# Patient Record
Sex: Male | Born: 1935 | Race: White | Hispanic: No | Smoking: Never smoker
Health system: Southern US, Community
[De-identification: ages and names within clinical notes are randomized; demographics above are authoritative.]

## PROBLEM LIST (undated history)

## (undated) DIAGNOSIS — M1990 Unspecified osteoarthritis, unspecified site: Secondary | ICD-10-CM

## (undated) DIAGNOSIS — I639 Cerebral infarction, unspecified: Secondary | ICD-10-CM

## (undated) DIAGNOSIS — R011 Cardiac murmur, unspecified: Secondary | ICD-10-CM

## (undated) DIAGNOSIS — I10 Essential (primary) hypertension: Secondary | ICD-10-CM

## (undated) HISTORY — PX: JOINT REPLACEMENT: SHX530

## (undated) HISTORY — PX: TONSILLECTOMY: SUR1361

---

## 2013-03-18 ENCOUNTER — Encounter (HOSPITAL_COMMUNITY): Payer: Self-pay | Admitting: Pharmacy Technician

## 2013-03-22 NOTE — H&P (Signed)
TOTAL KNEE ADMISSION H&P  Patient is being admitted for right total knee arthroplasty.  Subjective:  Chief Complaint:right knee pain.  HPI: Chad [name], 79 y.o. male, has a history of pain and functional disability in the right knee due to arthritis and has failed non-surgical conservative treatments for greater than 12 weeks to includeNSAID's and/or analgesics, corticosteriod injections, flexibility and strengthening excercises and activity modification.  Onset of symptoms was gradual, starting 3-4 years ago with gradually worsening course since that time. The patient noted no past surgery on the right knee(s).  Patient currently rates pain in the right knee(s) at 6 out of 10 with activity. Patient has night pain, worsening of pain with activity and weight bearing, pain that interferes with activities of daily living and joint swelling.  Patient has evidence of periarticular osteophytes and joint space narrowing by imaging studies. There is no active infection.  Past Medical History Hypertension Hypercholesteremia Heart Murmur BPH Osteoarthritis   Past Surgical History Tonsillectomy  Current outpatient prescriptions: amLODipine (NORVASC) 5 MG tablet, Take 5 mg by mouth every morning., Disp: , Rfl: ;   atenolol (TENORMIN) 100 MG tablet, Take 100 mg by mouth every morning., Disp: , Rfl: ;   losartan-hydrochlorothiazide (HYZAAR) 100-12.5 MG per tablet, Take 1 tablet by mouth every morning., Disp: , Rfl: ;   lovastatin (MEVACOR) 40 MG tablet, Take 40 mg by mouth at bedtime., Disp: , Rfl:  tamsulosin (FLOMAX) 0.4 MG CAPS capsule, Take 0.4 mg by mouth daily., Disp: , Rfl:   No Known Allergies   History  Substance Use Topics  . Smoking status: Nonsmoker  . Smokeless tobacco: None  . Alcohol Use: Occasional beer    Family History Father deceased age 32 due to meningitis Mother deceased age 39 due to "old age"  Review of Systems  Constitutional: Negative.   HENT: Negative.   Negative for neck pain.   Eyes: Negative.   Respiratory: Negative.   Cardiovascular: Negative.   Gastrointestinal: Negative.   Genitourinary: Positive for frequency. Negative for dysuria, urgency, hematuria and flank pain.  Musculoskeletal: Positive for joint pain. Negative for myalgias, back pain and falls.  Skin: Negative.   Neurological: Negative.   Endo/Heme/Allergies: Negative.   Psychiatric/Behavioral: Negative.     Objective:  Physical Exam  Constitutional: He is oriented to person, place, and time. He appears well-developed and well-nourished. No distress.  HENT:  Head: Normocephalic and atraumatic.  Right Ear: External ear normal.  Left Ear: External ear normal.  Nose: Nose normal.  Mouth/Throat: Oropharynx is clear and moist.  Eyes: Conjunctivae and EOM are normal.  Neck: Normal range of motion. Neck supple.  Cardiovascular: Normal rate, regular rhythm and intact distal pulses.   Murmur heard.  Systolic murmur is present with a grade of 3/6  Respiratory: Effort normal and breath sounds normal. No respiratory distress. He has no wheezes.  GI: Soft. Bowel sounds are normal. He exhibits no distension and no mass. There is no tenderness.  Musculoskeletal:       Right hip: Normal.       Left hip: Normal.       Right knee: He exhibits decreased range of motion and swelling. He exhibits no effusion and no erythema. Tenderness found. Medial joint line and lateral joint line tenderness noted.       Left knee: Normal.       Left lower leg: He exhibits no tenderness and no swelling.  Lymphadenopathy:    He has no cervical adenopathy.  Neurological: He  is alert and oriented to person, place, and time. He has normal strength and normal reflexes. No sensory deficit.  Skin: He is not diaphoretic.  Psychiatric: He has a normal mood and affect. His behavior is normal.   Vitals Weight: 154 lb Height: 65 in Body Surface Area: 1.79 m Body Mass Index: 25.63 kg/m Pulse: 72  (Regular) BP: 140/85 (Sitting, Left Arm, Standard)  Imaging Review Plain radiographs demonstrate severe degenerative joint disease of the right knee(s). The overall alignment issignificant varus. The bone quality appears to be fair for age and reported activity level.  Assessment/Plan:  End stage arthritis, right knee   The patient history, physical examination, clinical judgment of the provider and imaging studies are consistent with end stage degenerative joint disease of the right knee(s) and total knee arthroplasty is deemed medically necessary. The treatment options including medical management, injection therapy arthroscopy and arthroplasty were discussed at length. The risks and benefits of total knee arthroplasty were presented and reviewed. The risks due to aseptic loosening, infection, stiffness, patella tracking problems, thromboembolic complications and other imponderables were discussed. The patient acknowledged the explanation, agreed to proceed with the plan and consent was signed. Patient is being admitted for inpatient treatment for surgery, pain control, PT, OT, prophylactic antibiotics, VTE prophylaxis, progressive ambulation and ADL's and discharge planning. The patient is planning to be discharged home with home health services     [place], [place]

## 2013-03-22 NOTE — Patient Instructions (Signed)
Chad [name]  03/22/2013   Your procedure is scheduled on:  03/30/13               Surgery 0730am-1000am  Report to [facility] at    [place]  AM.  Call this number if you have problems the morning of surgery: [phone]   Remember:   Do not eat food or drink liquids after midnight.   Take these medicines the morning of surgery with A SIP OF WATER:    Do not wear jewelry,  Do not wear lotions, powders, or perfumes.   Men may shave face and neck.  Do not bring valuables to the hospital.  Contacts, dentures or bridgework may not be worn into surgery.  Leave suitcase in the car. After surgery it may be brought to your room.       SEE CHG INSTRUCTION SHEET    Please read over the following fact sheets that you were given: MRSA Information, coughing and deep breathing exercises, leg exercises, Blood Transfusion Fact Sheet , Incentive Spirometry Fact sheet                Failure to comply with these instructions may result in cancellation of your surgery.                Patient Signature ____________________________              Nurse Signature _____________________________

## 2013-03-23 ENCOUNTER — Ambulatory Visit (HOSPITAL_COMMUNITY)
Admission: RE | Admit: 2013-03-23 | Discharge: 2013-03-23 | Disposition: A | Discharge status: Home / Self Care | Payer: Medicare Other | Source: Ambulatory Visit | Attending: Surgical | Admitting: Surgical

## 2013-03-23 ENCOUNTER — Encounter (HOSPITAL_COMMUNITY): Payer: Self-pay

## 2013-03-23 ENCOUNTER — Encounter (HOSPITAL_COMMUNITY)
Admission: RE | Admit: 2013-03-23 | Discharge: 2013-03-23 | Disposition: A | Discharge status: Home / Self Care | Payer: Medicare Other | Source: Ambulatory Visit | Attending: Orthopedic Surgery | Admitting: Orthopedic Surgery

## 2013-03-23 DIAGNOSIS — M1710 Unilateral primary osteoarthritis, unspecified knee: Secondary | ICD-10-CM | POA: Insufficient documentation

## 2013-03-23 DIAGNOSIS — Z01812 Encounter for preprocedural laboratory examination: Secondary | ICD-10-CM | POA: Insufficient documentation

## 2013-03-23 DIAGNOSIS — Z01818 Encounter for other preprocedural examination: Secondary | ICD-10-CM | POA: Insufficient documentation

## 2013-03-23 DIAGNOSIS — I517 Cardiomegaly: Secondary | ICD-10-CM | POA: Insufficient documentation

## 2013-03-23 HISTORY — DX: Essential (primary) hypertension: I10

## 2013-03-23 HISTORY — DX: Unspecified osteoarthritis, unspecified site: M19.90

## 2013-03-23 HISTORY — DX: Cardiac murmur, unspecified: R01.1

## 2013-03-23 LAB — CBC
HCT: 37.1 % — ABNORMAL LOW (ref 39.0–52.0)
Hemoglobin: 12.1 g/dL — ABNORMAL LOW (ref 13.0–17.0)
MCH: 27.8 pg (ref 26.0–34.0)
MCHC: 32.6 g/dL (ref 30.0–36.0)
MCV: 85.3 fL (ref 78.0–100.0)
Platelets: 164 10*3/uL (ref 150–400)
RBC: 4.35 MIL/uL (ref 4.22–5.81)
RDW: 14.6 % (ref 11.5–15.5)
WBC: 7.1 10*3/uL (ref 4.0–10.5)

## 2013-03-23 LAB — URINALYSIS, ROUTINE W REFLEX MICROSCOPIC
Bilirubin Urine: NEGATIVE
Glucose, UA: NEGATIVE mg/dL
Hgb urine dipstick: NEGATIVE
Ketones, ur: NEGATIVE mg/dL
Nitrite: NEGATIVE
Protein, ur: NEGATIVE mg/dL
Specific Gravity, Urine: 1.013 (ref 1.005–1.030)
Urobilinogen, UA: 0.2 mg/dL (ref 0.0–1.0)
pH: 6.5 (ref 5.0–8.0)

## 2013-03-23 LAB — COMPREHENSIVE METABOLIC PANEL
ALT: 11 U/L (ref 0–53)
AST: 18 U/L (ref 0–37)
Albumin: 3.6 g/dL (ref 3.5–5.2)
Alkaline Phosphatase: 54 U/L (ref 39–117)
BUN: 24 mg/dL — ABNORMAL HIGH (ref 6–23)
CO2: 28 mEq/L (ref 19–32)
Calcium: 9.8 mg/dL (ref 8.4–10.5)
Chloride: 105 mEq/L (ref 96–112)
Creatinine, Ser: 1.45 mg/dL — ABNORMAL HIGH (ref 0.50–1.35)
GFR calc Af Amer: 51 mL/min — ABNORMAL LOW (ref >90)
GFR calc non Af Amer: 44 mL/min — ABNORMAL LOW (ref >90)
Glucose, Bld: 109 mg/dL — ABNORMAL HIGH (ref 70–99)
Potassium: 4.2 mEq/L (ref 3.5–5.1)
Sodium: 141 mEq/L (ref 135–145)
Total Bilirubin: 0.5 mg/dL (ref 0.3–1.2)
Total Protein: 7.5 g/dL (ref 6.0–8.3)

## 2013-03-23 LAB — APTT: aPTT: 36 seconds (ref 24–37)

## 2013-03-23 LAB — URINE MICROSCOPIC-ADD ON

## 2013-03-23 LAB — PROTIME-INR
INR: 1.26 (ref 0.00–1.49)
Prothrombin Time: 15.5 seconds — ABNORMAL HIGH (ref 11.6–15.2)

## 2013-03-23 LAB — ABO/RH: ABO/RH(D): A POS

## 2013-03-23 LAB — SURGICAL PCR SCREEN
MRSA, PCR: NEGATIVE
Staphylococcus aureus: POSITIVE — AB

## 2013-03-30 ENCOUNTER — Inpatient Hospital Stay (HOSPITAL_COMMUNITY): Payer: Medicare Other | Admitting: Anesthesiology

## 2013-03-30 ENCOUNTER — Encounter (HOSPITAL_COMMUNITY): Payer: Self-pay | Admitting: *Deleted

## 2013-03-30 ENCOUNTER — Inpatient Hospital Stay (HOSPITAL_COMMUNITY): Payer: Medicare Other

## 2013-03-30 ENCOUNTER — Encounter (HOSPITAL_COMMUNITY): Payer: Self-pay | Admitting: Anesthesiology

## 2013-03-30 ENCOUNTER — Encounter (HOSPITAL_COMMUNITY)
Admission: RE | Disposition: A | Discharge status: Home Health | Payer: Self-pay | Source: Ambulatory Visit | Attending: Orthopedic Surgery

## 2013-03-30 ENCOUNTER — Inpatient Hospital Stay (HOSPITAL_COMMUNITY)
Admission: RE | Admit: 2013-03-30 | Discharge: 2013-04-01 | DRG: 470 | Disposition: A | Discharge status: Home Health | Payer: Medicare Other | Source: Ambulatory Visit | Attending: Orthopedic Surgery | Admitting: Orthopedic Surgery

## 2013-03-30 DIAGNOSIS — I10 Essential (primary) hypertension: Secondary | ICD-10-CM | POA: Diagnosis present

## 2013-03-30 DIAGNOSIS — M24569 Contracture, unspecified knee: Secondary | ICD-10-CM | POA: Diagnosis present

## 2013-03-30 DIAGNOSIS — D62 Acute posthemorrhagic anemia: Secondary | ICD-10-CM | POA: Diagnosis not present

## 2013-03-30 DIAGNOSIS — E7800 Pure hypercholesterolemia, unspecified: Secondary | ICD-10-CM | POA: Diagnosis present

## 2013-03-30 DIAGNOSIS — M1710 Unilateral primary osteoarthritis, unspecified knee: Principal | ICD-10-CM | POA: Diagnosis present

## 2013-03-30 DIAGNOSIS — M1711 Unilateral primary osteoarthritis, right knee: Secondary | ICD-10-CM | POA: Diagnosis present

## 2013-03-30 HISTORY — PX: TOTAL KNEE ARTHROPLASTY: SHX125

## 2013-03-30 LAB — TYPE AND SCREEN
ABO/RH(D): A POS
Antibody Screen: NEGATIVE

## 2013-03-30 SURGERY — ARTHROPLASTY, KNEE, TOTAL
Anesthesia: General | Site: Knee | Laterality: Right | Wound class: Clean

## 2013-03-30 MED ORDER — CEFAZOLIN SODIUM 1-5 GM-% IV SOLN
1.0000 g | Freq: Four times a day (QID) | INTRAVENOUS | Status: AC
  Administered 2013-03-30 (×2): 1 g via INTRAVENOUS
  Filled 2013-03-30 (×2): qty 50

## 2013-03-30 MED ORDER — HYDROCODONE-ACETAMINOPHEN 5-325 MG PO TABS
1.0000 | ORAL_TABLET | ORAL | Status: DC | PRN
  Administered 2013-03-30 – 2013-04-01 (×8): 2 via ORAL
  Filled 2013-03-30 (×8): qty 2

## 2013-03-30 MED ORDER — HYDROCHLOROTHIAZIDE 12.5 MG PO CAPS
12.5000 mg | ORAL_CAPSULE | Freq: Every day | ORAL | Status: DC
  Administered 2013-03-30 – 2013-04-01 (×3): 12.5 mg via ORAL
  Filled 2013-03-30 (×3): qty 1

## 2013-03-30 MED ORDER — MUPIROCIN 2 % EX OINT
TOPICAL_OINTMENT | CUTANEOUS | Status: AC
  Filled 2013-03-30: qty 22

## 2013-03-30 MED ORDER — BUPIVACAINE LIPOSOME 1.3 % IJ SUSP
20.0000 mL | Freq: Once | INTRAMUSCULAR | Status: DC
  Filled 2013-03-30: qty 20

## 2013-03-30 MED ORDER — HYDROMORPHONE HCL PF 1 MG/ML IJ SOLN: 1.0000 mg | INTRAMUSCULAR | Status: DC | PRN

## 2013-03-30 MED ORDER — HYDROMORPHONE HCL PF 1 MG/ML IJ SOLN: 0.2500 mg | INTRAMUSCULAR | Status: DC | PRN

## 2013-03-30 MED ORDER — LOSARTAN POTASSIUM-HCTZ 100-12.5 MG PO TABS: 1.0000 | ORAL_TABLET | Freq: Every morning | ORAL | Status: DC

## 2013-03-30 MED ORDER — SODIUM CHLORIDE 0.9 % IJ SOLN
INTRAMUSCULAR | Status: DC | PRN
  Administered 2013-03-30: 20 mL

## 2013-03-30 MED ORDER — OXYCODONE-ACETAMINOPHEN 5-325 MG PO TABS: 2.0000 | ORAL_TABLET | ORAL | Status: DC | PRN

## 2013-03-30 MED ORDER — ROCURONIUM BROMIDE 100 MG/10ML IV SOLN
INTRAVENOUS | Status: DC | PRN
  Administered 2013-03-30: 40 mg via INTRAVENOUS

## 2013-03-30 MED ORDER — PROPOFOL 10 MG/ML IV BOLUS
INTRAVENOUS | Status: DC | PRN
  Administered 2013-03-30: 150 mg via INTRAVENOUS

## 2013-03-30 MED ORDER — HYDROMORPHONE HCL PF 1 MG/ML IJ SOLN
INTRAMUSCULAR | Status: DC | PRN
  Administered 2013-03-30 (×2): 1 mg via INTRAVENOUS

## 2013-03-30 MED ORDER — CEFAZOLIN SODIUM-DEXTROSE 2-3 GM-% IV SOLR
2.0000 g | INTRAVENOUS | Status: AC
  Administered 2013-03-30: 2 g via INTRAVENOUS

## 2013-03-30 MED ORDER — METHOCARBAMOL 500 MG PO TABS
500.0000 mg | ORAL_TABLET | Freq: Four times a day (QID) | ORAL | Status: DC | PRN
  Administered 2013-03-30 – 2013-03-31 (×3): 500 mg via ORAL
  Filled 2013-03-30 (×3): qty 1

## 2013-03-30 MED ORDER — ATROPINE SULFATE 0.4 MG/ML IJ SOLN
INTRAMUSCULAR | Status: DC | PRN
  Administered 2013-03-30: 0.4 mg via INTRAVENOUS

## 2013-03-30 MED ORDER — SODIUM CHLORIDE 0.9 % IR SOLN
Status: DC | PRN
  Administered 2013-03-30: 3000 mL

## 2013-03-30 MED ORDER — DEXAMETHASONE SODIUM PHOSPHATE 10 MG/ML IJ SOLN
INTRAMUSCULAR | Status: DC | PRN
  Administered 2013-03-30: 10 mg via INTRAVENOUS

## 2013-03-30 MED ORDER — FENTANYL CITRATE 0.05 MG/ML IJ SOLN
INTRAMUSCULAR | Status: DC | PRN
  Administered 2013-03-30: 50 ug via INTRAVENOUS
  Administered 2013-03-30 (×2): 100 ug via INTRAVENOUS

## 2013-03-30 MED ORDER — ALUM & MAG HYDROXIDE-SIMETH 200-200-20 MG/5ML PO SUSP: 30.0000 mL | ORAL | Status: DC | PRN

## 2013-03-30 MED ORDER — POLYETHYLENE GLYCOL 3350 17 G PO PACK: 17.0000 g | PACK | Freq: Every day | ORAL | Status: DC | PRN

## 2013-03-30 MED ORDER — ACETAMINOPHEN 650 MG RE SUPP: 650.0000 mg | Freq: Four times a day (QID) | RECTAL | Status: DC | PRN

## 2013-03-30 MED ORDER — SODIUM CHLORIDE 0.9 % IR SOLN
Status: DC | PRN
  Administered 2013-03-30: 09:00:00

## 2013-03-30 MED ORDER — CHLORHEXIDINE GLUCONATE 4 % EX LIQD
60.0000 mL | Freq: Once | CUTANEOUS | Status: DC
  Filled 2013-03-30: qty 60

## 2013-03-30 MED ORDER — THROMBIN 5000 UNITS EX SOLR
CUTANEOUS | Status: DC | PRN
  Administered 2013-03-30: 5000 [IU] via TOPICAL

## 2013-03-30 MED ORDER — BUPIVACAINE LIPOSOME 1.3 % IJ SUSP
INTRAMUSCULAR | Status: DC | PRN
  Administered 2013-03-30: 20 mL

## 2013-03-30 MED ORDER — FERROUS SULFATE 325 (65 FE) MG PO TABS
325.0000 mg | ORAL_TABLET | Freq: Three times a day (TID) | ORAL | Status: DC
  Administered 2013-03-31 – 2013-04-01 (×5): 325 mg via ORAL
  Filled 2013-03-30 (×7): qty 1

## 2013-03-30 MED ORDER — BISACODYL 5 MG PO TBEC: 5.0000 mg | DELAYED_RELEASE_TABLET | Freq: Every day | ORAL | Status: DC | PRN

## 2013-03-30 MED ORDER — AMLODIPINE BESYLATE 5 MG PO TABS
5.0000 mg | ORAL_TABLET | Freq: Every morning | ORAL | Status: DC
  Administered 2013-03-31 – 2013-04-01 (×2): 5 mg via ORAL
  Filled 2013-03-30 (×2): qty 1

## 2013-03-30 MED ORDER — LIDOCAINE HCL (CARDIAC) 20 MG/ML IV SOLN
INTRAVENOUS | Status: DC | PRN
  Administered 2013-03-30: 50 mg via INTRAVENOUS

## 2013-03-30 MED ORDER — MUPIROCIN CALCIUM 2 % EX CREA: TOPICAL_CREAM | Freq: Two times a day (BID) | CUTANEOUS | Status: DC

## 2013-03-30 MED ORDER — PROMETHAZINE HCL 25 MG/ML IJ SOLN: 6.2500 mg | INTRAMUSCULAR | Status: DC | PRN

## 2013-03-30 MED ORDER — TAMSULOSIN HCL 0.4 MG PO CAPS
0.4000 mg | ORAL_CAPSULE | Freq: Every day | ORAL | Status: DC
  Administered 2013-03-31 – 2013-04-01 (×2): 0.4 mg via ORAL
  Filled 2013-03-30 (×2): qty 1

## 2013-03-30 MED ORDER — ONDANSETRON HCL 4 MG/2ML IJ SOLN
INTRAMUSCULAR | Status: DC | PRN
  Administered 2013-03-30: 4 mg via INTRAVENOUS

## 2013-03-30 MED ORDER — LOSARTAN POTASSIUM 50 MG PO TABS
100.0000 mg | ORAL_TABLET | Freq: Every day | ORAL | Status: DC
  Administered 2013-03-30 – 2013-04-01 (×3): 100 mg via ORAL
  Filled 2013-03-30 (×3): qty 2

## 2013-03-30 MED ORDER — ATENOLOL 100 MG PO TABS
100.0000 mg | ORAL_TABLET | Freq: Every morning | ORAL | Status: DC
  Administered 2013-03-31 – 2013-04-01 (×2): 100 mg via ORAL
  Filled 2013-03-30 (×2): qty 1

## 2013-03-30 MED ORDER — MENTHOL 3 MG MT LOZG: 1.0000 | LOZENGE | OROMUCOSAL | Status: DC | PRN

## 2013-03-30 MED ORDER — FLEET ENEMA 7-19 GM/118ML RE ENEM: 1.0000 | ENEMA | Freq: Once | RECTAL | Status: AC | PRN

## 2013-03-30 MED ORDER — ONDANSETRON HCL 4 MG/2ML IJ SOLN: 4.0000 mg | Freq: Four times a day (QID) | INTRAMUSCULAR | Status: DC | PRN

## 2013-03-30 MED ORDER — ONDANSETRON HCL 4 MG PO TABS: 4.0000 mg | ORAL_TABLET | Freq: Four times a day (QID) | ORAL | Status: DC | PRN

## 2013-03-30 MED ORDER — LACTATED RINGERS IV SOLN
INTRAVENOUS | Status: DC
  Administered 2013-03-30 (×2): via INTRAVENOUS

## 2013-03-30 MED ORDER — CELECOXIB 200 MG PO CAPS
200.0000 mg | ORAL_CAPSULE | Freq: Two times a day (BID) | ORAL | Status: DC
  Administered 2013-03-30 – 2013-04-01 (×4): 200 mg via ORAL
  Filled 2013-03-30 (×6): qty 1

## 2013-03-30 MED ORDER — LACTATED RINGERS IV SOLN
INTRAVENOUS | Status: DC
  Administered 2013-03-30: 23:00:00 via INTRAVENOUS

## 2013-03-30 MED ORDER — ACETAMINOPHEN 325 MG PO TABS: 650.0000 mg | ORAL_TABLET | Freq: Four times a day (QID) | ORAL | Status: DC | PRN

## 2013-03-30 MED ORDER — PHENOL 1.4 % MT LIQD: 1.0000 | OROMUCOSAL | Status: DC | PRN

## 2013-03-30 MED ORDER — BACITRACIN-NEOMYCIN-POLYMYXIN OINTMENT TUBE
TOPICAL_OINTMENT | CUTANEOUS | Status: DC | PRN
  Administered 2013-03-30: 1 via TOPICAL

## 2013-03-30 MED ORDER — RIVAROXABAN 10 MG PO TABS
10.0000 mg | ORAL_TABLET | Freq: Every day | ORAL | Status: DC
  Administered 2013-03-31 – 2013-04-01 (×2): 10 mg via ORAL
  Filled 2013-03-30 (×3): qty 1

## 2013-03-30 MED ORDER — METHOCARBAMOL 100 MG/ML IJ SOLN
500.0000 mg | Freq: Four times a day (QID) | INTRAVENOUS | Status: DC | PRN
  Administered 2013-03-30: 500 mg via INTRAVENOUS
  Filled 2013-03-30: qty 5

## 2013-03-30 MED ORDER — SIMVASTATIN 5 MG PO TABS
5.0000 mg | ORAL_TABLET | Freq: Every day | ORAL | Status: DC
  Administered 2013-03-30 – 2013-03-31 (×2): 5 mg via ORAL
  Filled 2013-03-30 (×3): qty 1

## 2013-03-30 SURGICAL SUPPLY — 67 items
BAG ZIPLOCK 12X15 (MISCELLANEOUS) ×2 IMPLANT
BANDAGE ELASTIC 4 VELCRO ST LF (GAUZE/BANDAGES/DRESSINGS) ×2 IMPLANT
BANDAGE ELASTIC 6 VELCRO ST LF (GAUZE/BANDAGES/DRESSINGS) ×2 IMPLANT
BANDAGE ESMARK 6X9 LF (GAUZE/BANDAGES/DRESSINGS) ×1 IMPLANT
BLADE SAG 18X100X1.27 (BLADE) ×2 IMPLANT
BLADE SAW SGTL 11.0X1.19X90.0M (BLADE) ×2 IMPLANT
BNDG COHESIVE 4X5 TAN NS LF (GAUZE/BANDAGES/DRESSINGS) ×2 IMPLANT
BNDG ESMARK 6X9 LF (GAUZE/BANDAGES/DRESSINGS) ×2
BONE CEMENT GENTAMICIN (Cement) ×4 IMPLANT
CAPT RP KNEE ×2 IMPLANT
CEMENT BONE GENTAMICIN 40 (Cement) ×2 IMPLANT
CLOTH BEACON ORANGE TIMEOUT ST (SAFETY) ×2 IMPLANT
CUFF TOURN SGL QUICK 34 (TOURNIQUET CUFF) ×1
CUFF TRNQT CYL 34X4X40X1 (TOURNIQUET CUFF) ×1 IMPLANT
DERMABOND ADVANCED (GAUZE/BANDAGES/DRESSINGS) ×1
DERMABOND ADVANCED .7 DNX12 (GAUZE/BANDAGES/DRESSINGS) ×1 IMPLANT
DRAPE EXTREMITY T 121X128X90 (DRAPE) ×2 IMPLANT
DRAPE INCISE IOBAN 66X45 STRL (DRAPES) ×2 IMPLANT
DRAPE LG THREE QUARTER DISP (DRAPES) ×2 IMPLANT
DRAPE POUCH INSTRU U-SHP 10X18 (DRAPES) ×2 IMPLANT
DRAPE U-SHAPE 47X51 STRL (DRAPES) ×2 IMPLANT
DRSG ADAPTIC 3X8 NADH LF (GAUZE/BANDAGES/DRESSINGS) ×2 IMPLANT
DRSG AQUACEL AG ADV 3.5X10 (GAUZE/BANDAGES/DRESSINGS) IMPLANT
DRSG PAD ABDOMINAL 8X10 ST (GAUZE/BANDAGES/DRESSINGS) ×2 IMPLANT
DRSG TEGADERM 4X4.75 (GAUZE/BANDAGES/DRESSINGS) ×2 IMPLANT
DURAPREP 26ML APPLICATOR (WOUND CARE) ×2 IMPLANT
ELECT REM PT RETURN 9FT ADLT (ELECTROSURGICAL) ×2
ELECTRODE REM PT RTRN 9FT ADLT (ELECTROSURGICAL) ×1 IMPLANT
EVACUATOR 1/8 PVC DRAIN (DRAIN) ×2 IMPLANT
FACESHIELD LNG OPTICON STERILE (SAFETY) ×6 IMPLANT
GAUZE SPONGE 2X2 8PLY STRL LF (GAUZE/BANDAGES/DRESSINGS) ×1 IMPLANT
GLOVE BIOGEL PI IND STRL 8 (GLOVE) ×2 IMPLANT
GLOVE BIOGEL PI INDICATOR 8 (GLOVE) ×2
GLOVE ECLIPSE 8.0 STRL XLNG CF (GLOVE) ×6 IMPLANT
GLOVE SURG SS PI 6.5 STRL IVOR (GLOVE) ×6 IMPLANT
GOWN PREVENTION PLUS LG XLONG (DISPOSABLE) ×2 IMPLANT
GOWN STRL REIN XL XLG (GOWN DISPOSABLE) ×6 IMPLANT
HANDPIECE INTERPULSE COAX TIP (DISPOSABLE) ×1
IMMOBILIZER KNEE 20 (SOFTGOODS) ×2
IMMOBILIZER KNEE 20 THIGH 36 (SOFTGOODS) ×1 IMPLANT
KIT BASIN OR (CUSTOM PROCEDURE TRAY) ×2 IMPLANT
MANIFOLD NEPTUNE II (INSTRUMENTS) ×2 IMPLANT
NEEDLE HYPO 22GX1.5 SAFETY (NEEDLE) ×2 IMPLANT
NS IRRIG 1000ML POUR BTL (IV SOLUTION) ×2 IMPLANT
PACK TOTAL JOINT (CUSTOM PROCEDURE TRAY) ×2 IMPLANT
PADDING CAST COTTON 6X4 STRL (CAST SUPPLIES) ×2 IMPLANT
POSITIONER SURGICAL ARM (MISCELLANEOUS) ×2 IMPLANT
SET HNDPC FAN SPRY TIP SCT (DISPOSABLE) ×1 IMPLANT
SPONGE GAUZE 2X2 STER 10/PKG (GAUZE/BANDAGES/DRESSINGS) ×1
SPONGE GAUZE 4X4 12PLY (GAUZE/BANDAGES/DRESSINGS) ×2 IMPLANT
SPONGE LAP 18X18 X RAY DECT (DISPOSABLE) ×2 IMPLANT
SPONGE SURGIFOAM ABS GEL 100 (HEMOSTASIS) ×2 IMPLANT
STAPLER VISISTAT 35W (STAPLE) IMPLANT
SUT BONE WAX W31G (SUTURE) ×2 IMPLANT
SUT MNCRL AB 4-0 PS2 18 (SUTURE) ×2 IMPLANT
SUT VIC AB 1 CT1 27 (SUTURE) ×2
SUT VIC AB 1 CT1 27XBRD ANTBC (SUTURE) ×2 IMPLANT
SUT VIC AB 2-0 CT1 27 (SUTURE) ×3
SUT VIC AB 2-0 CT1 TAPERPNT 27 (SUTURE) ×3 IMPLANT
SUT VLOC 180 0 24IN GS25 (SUTURE) ×2 IMPLANT
SYR 20CC LL (SYRINGE) ×2 IMPLANT
TOWEL OR 17X26 10 PK STRL BLUE (TOWEL DISPOSABLE) ×4 IMPLANT
TOWER CARTRIDGE SMART MIX (DISPOSABLE) ×2 IMPLANT
TRAY FOLEY CATH 14FRSI W/METER (CATHETERS) IMPLANT
TRAY FOLEY CATH 16FRSI W/METER (SET/KITS/TRAYS/PACK) ×2 IMPLANT
WATER STERILE IRR 1500ML POUR (IV SOLUTION) ×2 IMPLANT
WRAP KNEE MAXI GEL POST OP (GAUZE/BANDAGES/DRESSINGS) ×4 IMPLANT

## 2013-03-30 NOTE — Progress Notes (Signed)
PT NOTE  Attempted POD 0 eval-pt declined to participate. Will check back in the morning. Thanks. Weston Anna, PT [phone]

## 2013-03-30 NOTE — Anesthesia Preprocedure Evaluation (Addendum)
Anesthesia Evaluation  Patient identified by MRN, date of birth, ID band Patient awake    Reviewed: Allergy & Precautions, H&P , NPO status , Patient's Chart, lab work & pertinent test results  Airway Mallampati: II TM Distance: >3 FB Neck ROM: Full    Dental  (+) Missing, Partial Upper and Dental Advisory Given   Pulmonary neg pulmonary ROS,  breath sounds clear to auscultation  Pulmonary exam normal       Cardiovascular hypertension, Pt. on medications and Pt. on home beta blockers Rhythm:Regular Rate:Normal     Neuro/Psych negative neurological ROS  negative psych ROS   GI/Hepatic negative GI ROS, Neg liver ROS,   Endo/Other  negative endocrine ROS  Renal/GU negative Renal ROS  negative genitourinary   Musculoskeletal negative musculoskeletal ROS (+)   Abdominal   Peds negative pediatric ROS (+)  Hematology negative hematology ROS (+)   Anesthesia Other Findings   Reproductive/Obstetrics negative OB ROS                          Anesthesia Physical Anesthesia Plan  ASA: II  Anesthesia Plan: General   Post-op Pain Management:    Induction: Intravenous  Airway Management Planned: Oral ETT  Additional Equipment:   Intra-op Plan:   Post-operative Plan: Extubation in OR  Informed Consent: I have reviewed the patients History and Physical, chart, labs and discussed the procedure including the risks, benefits and alternatives for the proposed anesthesia with the patient or authorized representative who has indicated his/her understanding and acceptance.   Dental advisory given  Plan Discussed with: CRNA and Surgeon  Anesthesia Plan Comments:         Anesthesia Quick Evaluation

## 2013-03-30 NOTE — Transfer of Care (Signed)
Immediate Anesthesia Transfer of Care Note  Patient: Chad [name]  Procedure(s) Performed: Procedure(s): RIGHT TOTAL KNEE ARTHROPLASTY (Right)  Patient Location: PACU  Anesthesia Type:General  Level of Consciousness: awake and alert   Airway & Oxygen Therapy: Patient Spontanous Breathing and Patient connected to face mask oxygen  Post-op Assessment: Report given to PACU RN and Post -op Vital signs reviewed and stable  Post vital signs: Reviewed and stable  Complications: No apparent anesthesia complications

## 2013-03-30 NOTE — Brief Op Note (Signed)
03/30/2013  10:01 AM  PATIENT:  Chad [name]  79 y.o. male  PRE-OPERATIVE DIAGNOSIS:  Osteoarthritis of the Right knee with severe Flexion Contracture and Bone on Bone.  POST-OPERATIVE DIAGNOSIS:  Osteoarthritis of the Right knee with Severe Flexion Contracture and Bone on Bone.  PROCEDURE:  Procedure(s): RIGHT TOTAL KNEE ARTHROPLASTY (Right),Complex with release of severe Flexion Contracture.  SURGEON:  Surgeon(s) and Role:    * Tobi Bastos, MD - Primary    * Magnus Sinning, MD - Assisting  ASSISTANTS: Tarri Glenn MD  ANESTHESIA:   general  EBL:  Total I/O In: 1000 [I.V.:1000] Out: 400 [Urine:400]  BLOOD ADMINISTERED:none  DRAINS: (One) Hemovact drain(s) in the Right Knee with  Suction Open   LOCAL MEDICATIONS USED:  BUPIVICAINE 20cc with 20cc Normal Saline.  SPECIMEN:  No Specimen  DISPOSITION OF SPECIMEN:  N/A  COUNTS:  YES  TOURNIQUET:   Total Tourniquet Time Documented: Thigh (Right) - 109 minutes Total: Thigh (Right) - 109 minutes   DICTATION: .Other Dictation: Dictation Number [id number]  PLAN OF CARE: Admit to inpatient   PATIENT DISPOSITION:  PACU - hemodynamically stable.   Delay start of Pharmacological VTE agent (>24hrs) due to surgical blood loss or risk of bleeding: yes

## 2013-03-30 NOTE — Interval H&P Note (Signed)
History and Physical Interval Note:  03/30/2013 7:22 AM  Chad [name]  has presented today for surgery, with the diagnosis of Osteoarthritis of the Right knee  The various methods of treatment have been discussed with the patient and family. After consideration of risks, benefits and other options for treatment, the patient has consented to  Procedure(s): RIGHT TOTAL KNEE ARTHROPLASTY (Right) as a surgical intervention .  The patient's history has been reviewed, patient examined, no change in status, stable for surgery.  I have reviewed the patient's chart and labs.  Questions were answered to the patient's satisfaction.     Marisa Hage A

## 2013-03-30 NOTE — Op Note (Signed)
Chad [name], Chad [name] NO.:  [id number]  MEDICAL RECORD NO.:  [id number]  LOCATION:  60                         FACILITY:  [facility]  PHYSICIAN:  Kipp Brood. Avonlea Sima, M.D.DATE OF BIRTH:  10/18/1931  DATE OF PROCEDURE:  03/30/2013 DATE OF DISCHARGE:                              OPERATIVE REPORT   SURGEON:  Kipp Brood. Gladstone Lighter, M.D.  ASSISTANT:  Tarri Glenn, M.D.  PREOPERATIVE DIAGNOSIS: 1. Severe flexion contracture, right knee. 2. Severe osteoarthritis with bone-on-bone, right knee.  POSTOPERATIVE DIAGNOSES: 1. Severe flexion contracture, right knee. 2. Severe osteoarthritis with bone-on-bone, right knee.  OPERATION:  Right total knee arthroplasty utilizing DePuy system.  All 3 components were cemented and gentamicin was used in the cement.  The sizes were size 4 right posterior cruciate sacrificing femoral component.  The tray was a size 4, the insert was a size 4 and 12.5 mm thickness rotating platform.  The patella was a size 35 with 3 pegs.  PROCEDURE IN DETAIL:  Under general anesthesia, routine orthopedic prep and draping of the right lower extremity was carried out.  The appropriate time-out was carried out first.  I also marked the appropriate right leg in the holding area.  At this time, after the prep, I exsanguinated the leg with an Esmarch and the tourniquet was elevated at 300 mmHg.  The knee was flexed.  An anterior approach of the knee was carried out.  Bleeders were identified and cauterized.  At this particular time, I then carried out a median parapatellar approach.  I reflected the patella laterally and then excised the anterior and posterior cruciate ligaments and did a medial and lateral meniscectomy and severe osteoarthritis was noted.  I did a synovectomy as well.  I then made my initial drill hole in the intercondylar notch.  I removed 12 mm thickness off the distal femur at this time.  I then went on and measured the femur to be a  size 4.  I put my next jig on the distal femur and did my anteroposterior and chamfering cuts at that time. Following that, the tibia was prepared in the usual fashion.  I continued my medial release on him since he had a severe flexion contracture, cleaned out the posterior area as well.  At this point, I measured the tibial tray to be a size 4.  The initial to be a size 4. The initial drill hole was made in the tibial plateau and I utilized the intramedullary guide.  I removed 6 mm thickness off the affected medial side.  At this time, I thoroughly irrigated out the area.  I inserted the lamina spreaders and made sure no spurs or any posterior adhesions. At this time, I then put my spacer guide and we had excellent stability with a 12.5 mm thickness.  We had good flexion and good extension.  At this time, I removed the spacer guides and then went on and prepared the tibia by cutting my keel cut after my initial drill hole.  I then cut the notch cut out of the distal femur, I thoroughly irrigated out the area and I inserted my trial components.  I first tried a 10-mm thickness  insert, it was loose.  I then tried a 12.5 mm thickness and it was excellent in flexion and extension and lateral and medial stability as well.  I then did a resurfacing procedure on the patella for a size 35 patella and 3 drill holes were made in the patella.  I removed all trial components, water picked out the knee and cemented all 3 components in simultaneously.  I then injected a portion of my mixture of Exparel 20 mL and 20 mL of normal saline, I used a half of that mixture to inject all my soft tissue structures.  I made sure that there were no loose pieces of cement.  I removed all loose pieces of cement. I then inserted my permanent 12.5 mm thickness size 4 rotating platform. Reduced the knee, checked the function we had excellent function again. I then inserted Hemovac drain and closed the wound layers in  usual fashion.  Prior to closing the wound, I used the remaining part of my mixture of Exparel and saline in the soft tissue.  The subcu was closed with 0 Vicryl, skin with metal staples, and sterile Neosporin dressing was applied.  The patient had 2 g of IV Ancef preop.          ______________________________ Kipp Brood. Gladstone Lighter, M.D.     RAG/MEDQ  D:  03/30/2013  T:  03/30/2013  Job:  [id number]

## 2013-03-30 NOTE — Progress Notes (Signed)
Utilization review completed.  

## 2013-03-31 ENCOUNTER — Encounter (HOSPITAL_COMMUNITY): Payer: Self-pay | Admitting: Orthopedic Surgery

## 2013-03-31 DIAGNOSIS — D62 Acute posthemorrhagic anemia: Secondary | ICD-10-CM | POA: Diagnosis present

## 2013-03-31 LAB — CBC
HCT: 27.7 % — ABNORMAL LOW (ref 39.0–52.0)
Hemoglobin: 9.1 g/dL — ABNORMAL LOW (ref 13.0–17.0)
MCH: 27.9 pg (ref 26.0–34.0)
MCHC: 32.9 g/dL (ref 30.0–36.0)
MCV: 85 fL (ref 78.0–100.0)
Platelets: 124 10*3/uL — ABNORMAL LOW (ref 150–400)
RBC: 3.26 MIL/uL — ABNORMAL LOW (ref 4.22–5.81)
RDW: 14.3 % (ref 11.5–15.5)
WBC: 7.9 10*3/uL (ref 4.0–10.5)

## 2013-03-31 LAB — BASIC METABOLIC PANEL
BUN: 26 mg/dL — ABNORMAL HIGH (ref 6–23)
CO2: 28 mEq/L (ref 19–32)
Calcium: 8.8 mg/dL (ref 8.4–10.5)
Chloride: 104 mEq/L (ref 96–112)
Creatinine, Ser: 1.36 mg/dL — ABNORMAL HIGH (ref 0.50–1.35)
GFR calc Af Amer: 55 mL/min — ABNORMAL LOW (ref >90)
GFR calc non Af Amer: 48 mL/min — ABNORMAL LOW (ref >90)
Glucose, Bld: 152 mg/dL — ABNORMAL HIGH (ref 70–99)
Potassium: 3.8 mEq/L (ref 3.5–5.1)
Sodium: 139 mEq/L (ref 135–145)

## 2013-03-31 NOTE — Care Management Note (Signed)
    Page 1 of 2   04/01/2013     2:46:59 PM   CARE MANAGEMENT NOTE 04/01/2013  Patient:  Chad [name], Chad [name]   Account Number:  [id number]  Date Initiated:  03/31/2013  Documentation initiated by:  Sherrin Daisy  Subjective/Objective Assessment:   dx OA rt knee; total knee replacemnt    Referral from [place] office to [facility] per rep.  Services will start day after pt is discharged.     Action/Plan:   CM spoke with patient. He plans to return to his home where daughter and son-in-law will be caregivers. He will need RW   Anticipated DC Date:  04/02/2013   Anticipated DC Plan:  [place]  CM consult      PAC Choice  [place]   Choice offered to / List presented to:  C-1 Patient   DME arranged  3-N-1  Vassie Moselle      DME agency  [place] arranged  [place]   Status of service:  Completed, signed off Medicare Important Message given?  NA - LOS <3 / Initial given by admissions (If response is "NO", the following Medicare IM given date fields will be blank) Date Medicare IM given:   Date Additional Medicare IM given:    Discharge Disposition:    Per UR Regulation:    If discussed at Long Length of Stay Meetings, dates discussed:    Comments:

## 2013-03-31 NOTE — Op Note (Signed)
NAMEERMEL, BOYCHUK NO.:  [id number]  MEDICAL RECORD NO.:  [id number]  LOCATION:  42                         FACILITY:  [facility]  PHYSICIAN:  Kipp Brood. Kaoru Rezendes, M.D.DATE OF BIRTH:  Dec 26, 1931  DATE OF PROCEDURE: DATE OF DISCHARGE:                              OPERATIVE REPORT   PREOPERATIVE DIAGNOSES: 1. Severe osteoarthritis of the right knee with bone on bone. 2. Severe flexion contracture, right knee.  POSTOPERATIVE DIAGNOSES: 1. Severe osteoarthritis of the right knee with bone on bone. 2. Severe flexion contracture, right knee.  OPERATION: 1. Release of flexion contracture, right knee. 2. Right total knee arthroplasty utilizing a size 4 right femoral     component DePuy, the posterior cruciate sacrificing type.  The tray     was a size 4 with a 12.5-mm thickness, insert size 4, rotating     platform.  The patella was a size 35 patella with 3 pegs.  All 3     components were cemented and gentamicin was used in the cement.  SURGEON:  Kipp Brood. Gladstone Lighter, M.D.  ASSISTANT:  Tarri Glenn, M.D.  DESCRIPTION OF PROCEDURE:  Under general anesthesia, routine orthopedic prep and draping of the right lower extremity carried out.  The leg was exsanguinated and Esmarch tourniquet was elevated at 300 mmHg.  The appropriate time-out was first carried out.  I also marked the appropriate right leg in the holding area.  At this time, the knee was flexed.          ______________________________ Kipp Brood Gladstone Lighter, M.D.     RAG/MEDQ  D:  03/30/2013  T:  03/30/2013  Job:  [id number]

## 2013-03-31 NOTE — Evaluation (Signed)
Occupational Therapy Evaluation Patient Details Name: Chad [name] MRN: [id number] DOB: 1932/05/26 Today's Date: 03/31/2013 Time: [id number] OT Time Calculation (min): 20 min  OT Assessment / Plan / Recommendation History of present illness pt admitted for R TKA   Clinical Impression  Pt was admitted for the above.  He will have 24/7 help at home and can get up from standard commode with min guard assist.  Pt plans to sponge bathe initially.  He will not need any further OT at this time.      OT Assessment  Patient does not need any further OT services    Follow Up Recommendations  No OT follow up;Supervision/Assistance - 24 hour    Barriers to Discharge      Equipment Recommendations  None recommended by OT    Recommendations for Other Services    Frequency       Precautions / Restrictions Precautions Precautions: Knee;Fall Required Braces or Orthoses: Knee Immobilizer - Right Knee Immobilizer - Right: Discontinue once straight leg raise with < 10 degree lag Restrictions Weight Bearing Restrictions: No   Pertinent Vitals/Pain Soreness only in R knee; repositioned with ice    ADL  Grooming: Supervision/safety Where Assessed - Grooming: Supported standing Lower Body Bathing: Minimal assistance Where Assessed - Lower Body Bathing: Supported sit to stand Lower Body Dressing: Minimal assistance Where Assessed - Lower Body Dressing: Supported sit to Lobbyist: Magazine features editor Method: Sit to Loss adjuster, chartered: Regular height toilet;Comfort height toilet (simulated 15" surface height) Toileting - Clothing Manipulation and Hygiene: Min guard Where Assessed - Toileting Clothing Manipulation and Hygiene: Sit to stand from 3-in-1 or toilet Transfers/Ambulation Related to ADLs: pt ambulated to bathroom.  He has a sink near commode but he may not be able to reach it for transfers.  He doesn't think he'll need a 3:1.  Able to get up from 15"  surface (shower chair) with min guard assist.  He will sponge bathe until he can step into tub safely ADL Comments: pt is able to reach to don pants:  needs min A with socks. Pt can perform UB adls with set up. Discussed tub bench, but pt will plan to sponge bathe initially.   OT Diagnosis:    OT Problem List:   OT Treatment Interventions:     OT Goals(Current goals can be found in the care plan section) Acute Rehab OT Goals Patient Stated Goal: Resume previous lifestyle with decreased pain  Visit Information  Last OT Received On: 03/31/13 Assistance Needed: +1 History of Present Illness: pt admitted for R TKA       Prior [place] expects to be discharged to:: Private residence Living Arrangements: Alone Available Help at Discharge: Family Type of Home: Apartment Home Access: Level entry Home Layout: One level Home Equipment: None Additional Comments: Pt states will have 24/7 assist from dtrs Prior Function Level of Independence: Independent Communication Communication: No difficulties Dominant Hand: Right         Vision/Perception     Cognition  Cognition Arousal/Alertness: Awake/alert Behavior During Therapy: WFL for tasks assessed/performed Overall Cognitive Status: Within Functional Limits for tasks assessed    Extremity/Trunk Assessment Upper Extremity Assessment Upper Extremity Assessment: Overall WFL for tasks assessed     Mobility  Transfers Sit to Stand: 4: Min guard Stand to Sit: 4: Min guard Details for Transfer Assistance: cues for LE management and use of UEs to self assist  Exercise    Balance     End of Session OT - End of Session Activity Tolerance: Patient tolerated treatment well Patient left: in chair;with call bell/phone within reach  [place] 03/31/2013, 12:39 PM Lesle Chris, OTR/L [phone] 03/31/2013

## 2013-03-31 NOTE — Progress Notes (Signed)
Subjective: 1 Day Post-Op Procedure(s) (LRB): RIGHT TOTAL KNEE ARTHROPLASTY (Right) Patient reports pain as 1 on 0-10 scale.Doing very well. Only minimal pain. Needs PT .Hemovac Dcd.   Objective: Vital signs in last 24 hours: Temp:  [96.1 F (35.6 C)-97.9 F (36.6 C)] 97.5 F (36.4 C) (08/28 0647) Pulse Rate:  [47-60] 54 (08/28 0647) Resp:  [10-19] 16 (08/28 0647) BP: (130-175)/(61-88) 141/62 mmHg (08/28 0647) SpO2:  [99 %-100 %] 100 % (08/28 0647) Weight:  [69.673 kg (153 lb 9.6 oz)] 69.673 kg (153 lb 9.6 oz) (08/27 1130)  Intake/Output from previous day: 08/27 0701 - 08/28 0700 In: 4580 [P.O.:600; I.V.:3825; IV Piggyback:155] Out: 2350 [Urine:2200; Drains:150] Intake/Output this shift:     Recent Labs  03/31/13 0500  HGB 9.1*    Recent Labs  03/31/13 0500  WBC 7.9  RBC 3.26*  HCT 27.7*  PLT 124*    Recent Labs  03/31/13 0500  NA 139  K 3.8  CL 104  CO2 28  BUN 26*  CREATININE 1.36*  GLUCOSE 152*  CALCIUM 8.8   No results found for this basename: LABPT, INR,  in the last 72 hours  Dorsiflexion/Plantar flexion intact  Assessment/Plan: 1 Day Post-Op Procedure(s) (LRB): RIGHT TOTAL KNEE ARTHROPLASTY (Right) Up with therapy  Seleni Meller A 03/31/2013, 7:24 AM

## 2013-03-31 NOTE — Evaluation (Signed)
Physical Therapy Evaluation Patient Details Name: Chad [name] MRN: [id number] DOB: Feb 06, 1932 Today's Date: 03/31/2013 Time: [id number] PT Time Calculation (min): 26 min  PT Assessment / Plan / Recommendation History of Present Illness     Clinical Impression  Pt s/p R TKR presents with decreased R LE strength/ROM and post op pain limiting functional mobility.  Pt should progress to d/c home with family assist and HHPT follow up.    PT Assessment  Patient needs continued PT services    Follow Up Recommendations  Home health PT    Does the patient have the potential to tolerate intense rehabilitation      Barriers to Discharge        Equipment Recommendations  Rolling walker with 5" wheels    Recommendations for Other Services OT consult   Frequency 7X/week    Precautions / Restrictions Precautions Precautions: Knee;Fall Required Braces or Orthoses: Knee Immobilizer - Right Knee Immobilizer - Right: Discontinue once straight leg raise with < 10 degree lag (Pt performed IND SLR this date) Restrictions Weight Bearing Restrictions: No   Pertinent Vitals/Pain 7/10 with activity; cold pack provided; premedicated      Mobility  Bed Mobility Bed Mobility: Supine to Sit Supine to Sit: 4: Min guard Details for Bed Mobility Assistance: min cues for sequence and use of L LE to self assist Transfers Transfers: Sit to Stand;Stand to Sit Sit to Stand: 4: Min guard Stand to Sit: 4: Min guard Details for Transfer Assistance: cues for LE management and use of UEs to self assist Ambulation/Gait Ambulation/Gait Assistance: 4: Min assist Ambulation Distance (Feet): 28 Feet Assistive device: Rolling walker Ambulation/Gait Assistance Details: cues for posture, sequence, position from RW Gait Pattern: Step-to pattern;Decreased step length - right;Decreased step length - left;Trunk flexed General Gait Details: pain limited    Exercises Total Joint Exercises Ankle Circles/Pumps:  AROM;Both;10 reps;Supine Quad Sets: AROM;Both;10 reps;Supine Heel Slides: AAROM;15 reps;Supine;Right Straight Leg Raises: AROM;AAROM;Right;15 reps;Supine   PT Diagnosis: Difficulty walking  PT Problem List: Decreased strength;Decreased range of motion;Decreased activity tolerance;Decreased mobility;Decreased knowledge of use of DME;Pain PT Treatment Interventions: DME instruction;Functional mobility training;Stair training;Gait training;Therapeutic activities;Therapeutic exercise;Patient/family education     PT Goals(Current goals can be found in the care plan section) Acute Rehab PT Goals Patient Stated Goal: Resume previous lifestyle with decreased pain PT Goal Formulation: With patient Time For Goal Achievement: 04/07/13 Potential to Achieve Goals: Good  Visit Information  Last PT Received On: 03/31/13 Assistance Needed: +1       Prior [place] expects to be discharged to:: Private residence Living Arrangements: Alone Available Help at Discharge: Family Type of Home: Apartment Home Access: Level entry Home Layout: One level Home Equipment: None Additional Comments: Pt states will have 24/7 assist from dtrs Prior Function Level of Independence: Independent Communication Communication: No difficulties Dominant Hand: Right    Cognition  Cognition Arousal/Alertness: Awake/alert Behavior During Therapy: WFL for tasks assessed/performed Overall Cognitive Status: Within Functional Limits for tasks assessed    Extremity/Trunk Assessment Upper Extremity Assessment Upper Extremity Assessment: Overall WFL for tasks assessed Lower Extremity Assessment Lower Extremity Assessment: RLE deficits/detail RLE Deficits / Details: 3/5 quad strength with AAROM -10 - 70 at knee   Balance    End of Session PT - End of Session Equipment Utilized During Treatment: Gait belt Activity Tolerance: Patient tolerated treatment well Patient left: in chair;with  call bell/phone within reach Nurse Communication: Mobility status  GP     Quantrell Splitt 03/31/2013, 10:42 AM

## 2013-03-31 NOTE — Clinical Documentation Improvement (Signed)
THIS DOCUMENT IS NOT A PERMANENT PART OF THE MEDICAL RECORD  Please update your documentation with the medical record to reflect your response to this query. If you need help knowing how to do this please call [phone].  03/31/13  Dear [name], PA-C  Chad [name]  In an effort to better capture your patient's severity of illness, reflect appropriate length of stay and utilization of resources, a review of the patient medical record has revealed the following indicators.    Based on your clinical judgment, please clarify and document in a progress note and/or discharge summary the clinical condition associated with the following supporting information:  In responding to this query please exercise your independent judgment.  The fact that a query is asked, does not imply that any particular answer is desired or expected.  According to lab pt's post op H/H 9.1/27.7 .   Clarification Needed   Please clarify if post op H/H can be further specified as one of the diagnoses listed below and document in pn or d/c summary.    Possible Clinical Conditions?   " Expected Acute Blood Loss Anemia  " Acute Blood Loss Anemia  " Acute on chronic blood loss anemia   " Other Condition________________  " Cannot Clinically Determine   Risk Factors:  Localized osteoarthrosis, lower leg R TKR  Supporting Information:  Signs and Symptoms    Diagnostics: Component     Latest Ref Rng 03/31/2013  Hemoglobin     13.0 - 17.0 g/dL 9.1 (L)  HCT     39.0 - 52.0 % 27.7 (L)   Treatments: ferrous sulfate tablet 325 mg      Reviewed: additional documentation in the medical record ljh  Thank You,  [name] RN, BSN, MSN/Inf, CCDS Clinical Documentation Specialist [facility] [name] HIM Dept Pager: [phone] / E-mail: [name]  [name]

## 2013-03-31 NOTE — Progress Notes (Signed)
Physical Therapy Treatment Patient Details Name: Bart Barmes MRN: [id number] DOB: [birth year] Today's Date: 03/31/2013 Time: 1220-1240 PT Time Calculation (min): 20 min  PT Assessment / Plan / Recommendation  History of Present Illness pt admitted for R TKA   PT Comments     Follow Up Recommendations  Home health PT     Does the patient have the potential to tolerate intense rehabilitation     Barriers to Discharge        Equipment Recommendations  Rolling walker with 5" wheels    Recommendations for Other Services OT consult  Frequency 7X/week   Progress towards PT Goals Progress towards PT goals: Progressing toward goals  Plan Current plan remains appropriate    Precautions / Restrictions Precautions Precautions: Knee;Fall Required Braces or Orthoses: Knee Immobilizer - Right Knee Immobilizer - Right: Discontinue once straight leg raise with < 10 degree lag Restrictions Weight Bearing Restrictions: No   Pertinent Vitals/Pain 3/10;     Mobility  Transfers Transfers: Sit to Stand;Stand to Sit Sit to Stand: 4: Min guard Stand to Sit: 4: Min guard Details for Transfer Assistance: cues for LE management and use of UEs to self assist Ambulation/Gait Ambulation/Gait Assistance: 4: Min guard Ambulation Distance (Feet): 142 Feet Assistive device: Rolling walker Ambulation/Gait Assistance Details: min cues for posture, sequence and position from RW Gait Pattern: Step-to pattern;Trunk flexed Stairs: No    Exercises     PT Diagnosis:    PT Problem List:   PT Treatment Interventions:     PT Goals (current goals can now be found in the care plan section) Acute Rehab PT Goals Patient Stated Goal: Resume previous lifestyle with decreased pain PT Goal Formulation: With patient Time For Goal Achievement: 04/07/13 Potential to Achieve Goals: Good  Visit Information  Last PT Received On: 03/31/13 Assistance Needed: +1 History of Present Illness: pt admitted for R  TKA    Subjective Data  Patient Stated Goal: Resume previous lifestyle with decreased pain   Cognition  Cognition Arousal/Alertness: Awake/alert Behavior During Therapy: WFL for tasks assessed/performed Overall Cognitive Status: Within Functional Limits for tasks assessed    Balance     End of Session PT - End of Session Equipment Utilized During Treatment: Gait belt Activity Tolerance: Patient tolerated treatment well Patient left: in chair;with call bell/phone within reach Nurse Communication: Mobility status   GP     Anselmo Reihl 03/31/2013, 1:02 PM

## 2013-04-01 LAB — CBC
HCT: 26.9 % — ABNORMAL LOW (ref 39.0–52.0)
Hemoglobin: 8.9 g/dL — ABNORMAL LOW (ref 13.0–17.0)
MCH: 28 pg (ref 26.0–34.0)
MCHC: 33.1 g/dL (ref 30.0–36.0)
MCV: 84.6 fL (ref 78.0–100.0)
Platelets: 125 10*3/uL — ABNORMAL LOW (ref 150–400)
RBC: 3.18 MIL/uL — ABNORMAL LOW (ref 4.22–5.81)
RDW: 14.3 % (ref 11.5–15.5)
WBC: 7.6 10*3/uL (ref 4.0–10.5)

## 2013-04-01 LAB — BASIC METABOLIC PANEL
BUN: 27 mg/dL — ABNORMAL HIGH (ref 6–23)
CO2: 28 mEq/L (ref 19–32)
Calcium: 8.7 mg/dL (ref 8.4–10.5)
Chloride: 105 mEq/L (ref 96–112)
Creatinine, Ser: 1.39 mg/dL — ABNORMAL HIGH (ref 0.50–1.35)
GFR calc Af Amer: 54 mL/min — ABNORMAL LOW (ref >90)
GFR calc non Af Amer: 47 mL/min — ABNORMAL LOW (ref >90)
Glucose, Bld: 98 mg/dL (ref 70–99)
Potassium: 3.8 mEq/L (ref 3.5–5.1)
Sodium: 138 mEq/L (ref 135–145)

## 2013-04-01 MED ORDER — METHOCARBAMOL 500 MG PO TABS: 500.0000 mg | ORAL_TABLET | Freq: Four times a day (QID) | ORAL | Status: DC | PRN

## 2013-04-01 MED ORDER — FERROUS SULFATE 325 (65 FE) MG PO TABS: 325.0000 mg | ORAL_TABLET | Freq: Three times a day (TID) | ORAL | Status: DC

## 2013-04-01 MED ORDER — RIVAROXABAN 10 MG PO TABS: 10.0000 mg | ORAL_TABLET | Freq: Every day | ORAL | Status: DC

## 2013-04-01 MED ORDER — OXYCODONE-ACETAMINOPHEN 5-325 MG PO TABS: 2.0000 | ORAL_TABLET | ORAL | Status: DC | PRN

## 2013-04-01 NOTE — Discharge Summary (Signed)
Physician Discharge Summary  Patient ID: Chad [name] MRN: [id number] DOB/AGE: May 24, 1932 79 y.o.  Admit date: 03/30/2013 Discharge date: 04/01/2013  Admission Diagnoses:Osteoarthritis Right Knee  Discharge Diagnoses: Osteoarthritis Right Knee Active Problems:   Osteoarthritis of right knee   Acute blood loss anemia   Discharged Condition: Improved  Hospital Course: No Post-Op problems.  Consults: rehabilitation medicine  Significant Diagnostic Studies: Post-Op Xrays  Treatments: antibiotics: Ancef  Discharge Exam: Blood pressure 128/53, pulse 66, temperature 98.2 F (36.8 C), temperature source Oral, resp. rate 16, height 5\' 6"  (1.676 m), weight 69.673 kg (153 lb 9.6 oz), SpO2 96.00%. Extremities: Wound looks [name]  Disposition: Final discharge disposition not confirmed  Discharge Orders   Future Orders Complete By Expires   Call MD / Call 911  As directed    Comments:     If you experience chest pain or shortness of breath, CALL 911 and be transported to the hospital emergency room.  If you develope a fever above 101 F, pus (white drainage) or increased drainage or redness at the wound, or calf pain, call your surgeon's office.   Discharge instructions  As directed    Comments:     Walk with your walker. Weight bearing as instructed. [place] will follow you at home for your therapy.See Dr. Gladstone Lighter in office in [place] your dressing daily. Shower only, no tub bath. Call if any temperatures greater than 101 or any wound complications: [phone] during the day and ask  for Dr. Charlestine Night nurse, Brunilda Payor.   Driving restrictions  As directed    Comments:     No driving for four weeks   Increase activity slowly as tolerated  As directed        Medication List         amLODipine 5 MG tablet  Commonly known as:  NORVASC  Take 5 mg by mouth every morning.     atenolol 100 MG tablet  Commonly known as:  TENORMIN  Take 100 mg by mouth every  morning.     ferrous sulfate 325 (65 FE) MG tablet  Take 1 tablet (325 mg total) by mouth 3 (three) times daily after meals.     losartan-hydrochlorothiazide 100-12.5 MG per tablet  Commonly known as:  HYZAAR  Take 1 tablet by mouth every morning.     lovastatin 40 MG tablet  Commonly known as:  MEVACOR  Take 40 mg by mouth at bedtime.     methocarbamol 500 MG tablet  Commonly known as:  ROBAXIN  Take 1 tablet (500 mg total) by mouth every 6 (six) hours as needed.     oxyCODONE-acetaminophen 5-325 MG per tablet  Commonly known as:  PERCOCET/ROXICET  Take 2 tablets by mouth every 4 (four) hours as needed.     rivaroxaban 10 MG Tabs tablet  Commonly known as:  XARELTO  Take 1 tablet (10 mg total) by mouth daily with breakfast.     tamsulosin 0.4 MG Caps capsule  Commonly known as:  FLOMAX  Take 0.4 mg by mouth daily.         Signed: Karim Aiello A 04/01/2013, 7:22 AM

## 2013-04-01 NOTE — Progress Notes (Signed)
Subjective: 2 Days Post-Op Procedure(s) (LRB): RIGHT TOTAL KNEE ARTHROPLASTY (Right) Patient reports pain as 1 on 0-10 scale. Doing well today. Dressing changed and wound looks fine.   Objective: Vital signs in last 24 hours: Temp:  [97.6 F (36.4 C)-98.2 F (36.8 C)] 98.2 F (36.8 C) (08/29 0543) Pulse Rate:  [57-68] 66 (08/29 0543) Resp:  [16] 16 (08/29 0543) BP: (119-149)/(53-66) 128/53 mmHg (08/29 0543) SpO2:  [95 %-100 %] 96 % (08/29 0543)  Intake/Output from previous day: 08/28 0701 - 08/29 0700 In: 79 [P.O.:840; I.V.:90] Out: 1175 [Urine:1175] Intake/Output this shift:     Recent Labs  03/31/13 0500 04/01/13 0418  HGB 9.1* 8.9*    Recent Labs  03/31/13 0500 04/01/13 0418  WBC 7.9 7.6  RBC 3.26* 3.18*  HCT 27.7* 26.9*  PLT 124* 125*    Recent Labs  03/31/13 0500 04/01/13 0418  NA 139 138  K 3.8 3.8  CL 104 105  CO2 28 28  BUN 26* 27*  CREATININE 1.36* 1.39*  GLUCOSE 152* 98  CALCIUM 8.8 8.7   No results found for this basename: LABPT, INR,  in the last 72 hours  No cellulitis present  Assessment/Plan: 2 Days Post-Op Procedure(s) (LRB): RIGHT TOTAL KNEE ARTHROPLASTY (Right) Discharge home with home health  Isiac Breighner A 04/01/2013, 7:14 AM

## 2013-04-01 NOTE — Progress Notes (Signed)
[facility]  [facility] is providing the following services: RW and [employer]  If patient discharges after hours, please call [phone].   Linward Headland 04/01/2013, 8:50 AM

## 2013-04-01 NOTE — Progress Notes (Signed)
Pt up unassisted for the second time tonight.  Reminded him again, as well as each interaction with him, that we would prefer he call for assistance to minimize his fall risk.  Pt. Insisted he was 'fine' though I continued to encourage him to call for assistance when OOB.  Placed in recliner, given blanket and reminded him to please call when he needed to move from the recliner.  Pt. Responded, "ok".   (just minutes after this incident, the patient's dtr arrived.  I briefed her on the above and asked her to call for any assistance.

## 2013-04-01 NOTE — Progress Notes (Signed)
Physical Therapy Treatment Patient Details Name: Chad [name] MRN: [id number] DOB: 18-Jul-1932 Today's Date: 04/01/2013 Time: [id number] PT Time Calculation (min): 28 min  PT Assessment / Plan / Recommendation  History of Present Illness pt admitted for R TKA   PT Comments     Follow Up Recommendations  Home health PT     Does the patient have the potential to tolerate intense rehabilitation     Barriers to Discharge        Equipment Recommendations  Rolling walker with 5" wheels    Recommendations for Other Services OT consult  Frequency 7X/week   Progress towards PT Goals Progress towards PT goals: Progressing toward goals  Plan Current plan remains appropriate    Precautions / Restrictions Precautions Precautions: Knee;Fall Required Braces or Orthoses: Knee Immobilizer - Right Knee Immobilizer - Right: Discontinue once straight leg raise with < 10 degree lag (Pt performed IND SLR this am) Restrictions Weight Bearing Restrictions: No   Pertinent Vitals/Pain 3/10; premed, cold packs provided    Mobility  Bed Mobility Bed Mobility: Supine to Sit;Sit to Supine Supine to Sit: 5: Supervision Sit to Supine: 5: Supervision Details for Bed Mobility Assistance: min cues for sequence and use of L LE to self assist Transfers Transfers: Sit to Stand;Stand to Sit Sit to Stand: 5: Supervision;From bed;From chair/3-in-1 Stand to Sit: 5: Supervision;To bed;To chair/3-in-1 Details for Transfer Assistance: cues for LE management and use of UEs to self assist Ambulation/Gait Ambulation/Gait Assistance: 4: Min guard;5: Supervision Ambulation Distance (Feet): 200 Feet Assistive device: Rolling walker Ambulation/Gait Assistance Details: min cues for position from RW Gait Pattern: Step-to pattern;Step-through pattern General Gait Details: progressed to recip gait Stairs: No    Exercises Total Joint Exercises Ankle Circles/Pumps: AROM;Both;Supine;20 reps Quad Sets:  AROM;Both;Supine;20 reps Heel Slides: AAROM;Supine;Right;20 reps Straight Leg Raises: AROM;AAROM;Right;Supine;20 reps   PT Diagnosis:    PT Problem List:   PT Treatment Interventions:     PT Goals (current goals can now be found in the care plan section) Acute Rehab PT Goals Patient Stated Goal: Resume previous lifestyle with decreased pain PT Goal Formulation: With patient Time For Goal Achievement: 04/07/13 Potential to Achieve Goals: Good  Visit Information  Last PT Received On: 04/01/13 Assistance Needed: +1 History of Present Illness: pt admitted for R TKA    Subjective Data  Patient Stated Goal: Resume previous lifestyle with decreased pain   Cognition  Cognition Arousal/Alertness: Awake/alert Behavior During Therapy: WFL for tasks assessed/performed Overall Cognitive Status: Within Functional Limits for tasks assessed    Balance     End of Session PT - End of Session Equipment Utilized During Treatment: Gait belt Activity Tolerance: Patient tolerated treatment well Patient left: in chair;with call bell/phone within reach Nurse Communication: Mobility status   GP     Chad [name] 04/01/2013, 11:50 AM

## 2013-04-01 NOTE — Progress Notes (Signed)
Pt to d/c home with [facility] home health. DME delivered to room. AVS reviewed and "My Chart" discussed with pt. Pt capable of verbalizing medications and follow-up appointments. Remains hemodynamically stable. No signs and symptoms of distress. Educated pt to return to ER in the case of SOB, dizziness, or chest pain.

## 2013-04-01 NOTE — Anesthesia Postprocedure Evaluation (Signed)
  Anesthesia Post-op Note  Patient: Chad [name]  Procedure(s) Performed: Procedure(s) (LRB): RIGHT TOTAL KNEE ARTHROPLASTY (Right)  Patient Location: PACU  Anesthesia Type: General  Level of Consciousness: awake and alert   Airway and Oxygen Therapy: Patient Spontanous Breathing  Post-op Pain: mild  Post-op Assessment: Post-op Vital signs reviewed, Patient's Cardiovascular Status Stable, Respiratory Function Stable, Patent Airway and No signs of Nausea or vomiting  Last Vitals:  Filed Vitals:   04/01/13 0924  BP: 136/64  Pulse:   Temp:   Resp:     Post-op Vital Signs: stable   Complications: No apparent anesthesia complications

## 2016-08-14 DIAGNOSIS — M1712 Unilateral primary osteoarthritis, left knee: Secondary | ICD-10-CM | POA: Diagnosis not present

## 2016-08-14 DIAGNOSIS — M25562 Pain in left knee: Secondary | ICD-10-CM | POA: Diagnosis not present

## 2016-09-26 DIAGNOSIS — I10 Essential (primary) hypertension: Secondary | ICD-10-CM | POA: Diagnosis not present

## 2016-09-26 DIAGNOSIS — R0982 Postnasal drip: Secondary | ICD-10-CM | POA: Diagnosis not present

## 2016-09-26 DIAGNOSIS — M1990 Unspecified osteoarthritis, unspecified site: Secondary | ICD-10-CM | POA: Diagnosis not present

## 2017-04-09 DIAGNOSIS — E7800 Pure hypercholesterolemia, unspecified: Secondary | ICD-10-CM | POA: Diagnosis not present

## 2017-04-09 DIAGNOSIS — Z23 Encounter for immunization: Secondary | ICD-10-CM | POA: Diagnosis not present

## 2017-04-09 DIAGNOSIS — Z125 Encounter for screening for malignant neoplasm of prostate: Secondary | ICD-10-CM | POA: Diagnosis not present

## 2017-04-09 DIAGNOSIS — Z79899 Other long term (current) drug therapy: Secondary | ICD-10-CM | POA: Diagnosis not present

## 2017-04-09 DIAGNOSIS — G894 Chronic pain syndrome: Secondary | ICD-10-CM | POA: Diagnosis not present

## 2017-04-09 DIAGNOSIS — Z0000 Encounter for general adult medical examination without abnormal findings: Secondary | ICD-10-CM | POA: Diagnosis not present

## 2017-04-09 DIAGNOSIS — N183 Chronic kidney disease, stage 3 (moderate): Secondary | ICD-10-CM | POA: Diagnosis not present

## 2017-04-09 DIAGNOSIS — R972 Elevated prostate specific antigen [PSA]: Secondary | ICD-10-CM | POA: Diagnosis not present

## 2017-04-09 DIAGNOSIS — R7301 Impaired fasting glucose: Secondary | ICD-10-CM | POA: Diagnosis not present

## 2017-06-18 DIAGNOSIS — H5200 Hypermetropia, unspecified eye: Secondary | ICD-10-CM | POA: Diagnosis not present

## 2017-06-18 DIAGNOSIS — H25811 Combined forms of age-related cataract, right eye: Secondary | ICD-10-CM | POA: Diagnosis not present

## 2017-06-18 DIAGNOSIS — I10 Essential (primary) hypertension: Secondary | ICD-10-CM | POA: Diagnosis not present

## 2017-10-19 DIAGNOSIS — M25562 Pain in left knee: Secondary | ICD-10-CM | POA: Diagnosis not present

## 2017-10-19 DIAGNOSIS — M1712 Unilateral primary osteoarthritis, left knee: Secondary | ICD-10-CM | POA: Diagnosis not present

## 2017-10-29 DIAGNOSIS — R972 Elevated prostate specific antigen [PSA]: Secondary | ICD-10-CM | POA: Diagnosis not present

## 2017-10-29 DIAGNOSIS — Z7189 Other specified counseling: Secondary | ICD-10-CM | POA: Diagnosis not present

## 2017-10-29 DIAGNOSIS — N183 Chronic kidney disease, stage 3 (moderate): Secondary | ICD-10-CM | POA: Diagnosis not present

## 2017-10-29 DIAGNOSIS — R7301 Impaired fasting glucose: Secondary | ICD-10-CM | POA: Diagnosis not present

## 2017-10-29 DIAGNOSIS — I10 Essential (primary) hypertension: Secondary | ICD-10-CM | POA: Diagnosis not present

## 2017-10-29 DIAGNOSIS — Z1211 Encounter for screening for malignant neoplasm of colon: Secondary | ICD-10-CM | POA: Diagnosis not present

## 2017-10-29 DIAGNOSIS — M170 Bilateral primary osteoarthritis of knee: Secondary | ICD-10-CM | POA: Diagnosis not present

## 2017-11-02 DIAGNOSIS — H5210 Myopia, unspecified eye: Secondary | ICD-10-CM | POA: Diagnosis not present

## 2018-06-03 DIAGNOSIS — Z0000 Encounter for general adult medical examination without abnormal findings: Secondary | ICD-10-CM | POA: Diagnosis not present

## 2018-06-03 DIAGNOSIS — R7301 Impaired fasting glucose: Secondary | ICD-10-CM | POA: Diagnosis not present

## 2018-06-03 DIAGNOSIS — M170 Bilateral primary osteoarthritis of knee: Secondary | ICD-10-CM | POA: Diagnosis not present

## 2018-06-03 DIAGNOSIS — N183 Chronic kidney disease, stage 3 (moderate): Secondary | ICD-10-CM | POA: Diagnosis not present

## 2018-06-03 DIAGNOSIS — D692 Other nonthrombocytopenic purpura: Secondary | ICD-10-CM | POA: Diagnosis not present

## 2018-06-03 DIAGNOSIS — R972 Elevated prostate specific antigen [PSA]: Secondary | ICD-10-CM | POA: Diagnosis not present

## 2018-06-03 DIAGNOSIS — G894 Chronic pain syndrome: Secondary | ICD-10-CM | POA: Diagnosis not present

## 2018-06-03 DIAGNOSIS — Z23 Encounter for immunization: Secondary | ICD-10-CM | POA: Diagnosis not present

## 2018-06-03 DIAGNOSIS — Z79899 Other long term (current) drug therapy: Secondary | ICD-10-CM | POA: Diagnosis not present

## 2018-06-03 DIAGNOSIS — E559 Vitamin D deficiency, unspecified: Secondary | ICD-10-CM | POA: Diagnosis not present

## 2018-06-04 ENCOUNTER — Other Ambulatory Visit: Payer: Self-pay | Admitting: Family Medicine

## 2018-06-04 DIAGNOSIS — N183 Chronic kidney disease, stage 3 unspecified: Secondary | ICD-10-CM

## 2018-07-19 ENCOUNTER — Other Ambulatory Visit: Payer: Self-pay

## 2018-08-05 ENCOUNTER — Other Ambulatory Visit: Payer: Self-pay

## 2018-09-07 DIAGNOSIS — Z0100 Encounter for examination of eyes and vision without abnormal findings: Secondary | ICD-10-CM | POA: Diagnosis not present

## 2018-12-02 DIAGNOSIS — G894 Chronic pain syndrome: Secondary | ICD-10-CM | POA: Diagnosis not present

## 2018-12-02 DIAGNOSIS — D573 Sickle-cell trait: Secondary | ICD-10-CM | POA: Diagnosis not present

## 2018-12-02 DIAGNOSIS — K029 Dental caries, unspecified: Secondary | ICD-10-CM | POA: Diagnosis not present

## 2019-06-02 DIAGNOSIS — H903 Sensorineural hearing loss, bilateral: Secondary | ICD-10-CM | POA: Diagnosis not present

## 2019-08-10 DIAGNOSIS — N401 Enlarged prostate with lower urinary tract symptoms: Secondary | ICD-10-CM | POA: Diagnosis not present

## 2019-08-10 DIAGNOSIS — D692 Other nonthrombocytopenic purpura: Secondary | ICD-10-CM | POA: Diagnosis not present

## 2019-08-10 DIAGNOSIS — N400 Enlarged prostate without lower urinary tract symptoms: Secondary | ICD-10-CM | POA: Diagnosis not present

## 2019-08-10 DIAGNOSIS — M1990 Unspecified osteoarthritis, unspecified site: Secondary | ICD-10-CM | POA: Diagnosis not present

## 2019-08-10 DIAGNOSIS — M50322 Other cervical disc degeneration at C5-C6 level: Secondary | ICD-10-CM | POA: Diagnosis not present

## 2019-08-10 DIAGNOSIS — G894 Chronic pain syndrome: Secondary | ICD-10-CM | POA: Diagnosis not present

## 2019-08-10 DIAGNOSIS — Z0000 Encounter for general adult medical examination without abnormal findings: Secondary | ICD-10-CM | POA: Diagnosis not present

## 2019-08-10 DIAGNOSIS — I10 Essential (primary) hypertension: Secondary | ICD-10-CM | POA: Diagnosis not present

## 2019-08-10 DIAGNOSIS — R195 Other fecal abnormalities: Secondary | ICD-10-CM | POA: Diagnosis not present

## 2019-08-10 DIAGNOSIS — Z79899 Other long term (current) drug therapy: Secondary | ICD-10-CM | POA: Diagnosis not present

## 2019-08-10 DIAGNOSIS — H9190 Unspecified hearing loss, unspecified ear: Secondary | ICD-10-CM | POA: Diagnosis not present

## 2019-08-10 DIAGNOSIS — N1832 Chronic kidney disease, stage 3b: Secondary | ICD-10-CM | POA: Diagnosis not present

## 2019-10-14 DIAGNOSIS — S31819A Unspecified open wound of right buttock, initial encounter: Secondary | ICD-10-CM | POA: Diagnosis not present

## 2019-10-25 ENCOUNTER — Other Ambulatory Visit: Payer: Self-pay

## 2019-10-25 ENCOUNTER — Encounter (HOSPITAL_BASED_OUTPATIENT_CLINIC_OR_DEPARTMENT_OTHER): Payer: Medicare HMO | Attending: Internal Medicine | Admitting: Internal Medicine

## 2019-10-25 DIAGNOSIS — M1990 Unspecified osteoarthritis, unspecified site: Secondary | ICD-10-CM | POA: Diagnosis not present

## 2019-10-25 DIAGNOSIS — E7800 Pure hypercholesterolemia, unspecified: Secondary | ICD-10-CM | POA: Diagnosis not present

## 2019-10-25 DIAGNOSIS — D573 Sickle-cell trait: Secondary | ICD-10-CM | POA: Diagnosis not present

## 2019-10-25 DIAGNOSIS — L98412 Non-pressure chronic ulcer of buttock with fat layer exposed: Secondary | ICD-10-CM | POA: Diagnosis not present

## 2019-10-25 DIAGNOSIS — N183 Chronic kidney disease, stage 3 unspecified: Secondary | ICD-10-CM | POA: Insufficient documentation

## 2019-10-25 DIAGNOSIS — N400 Enlarged prostate without lower urinary tract symptoms: Secondary | ICD-10-CM | POA: Insufficient documentation

## 2019-10-25 DIAGNOSIS — Z809 Family history of malignant neoplasm, unspecified: Secondary | ICD-10-CM | POA: Insufficient documentation

## 2019-10-25 DIAGNOSIS — I129 Hypertensive chronic kidney disease with stage 1 through stage 4 chronic kidney disease, or unspecified chronic kidney disease: Secondary | ICD-10-CM | POA: Insufficient documentation

## 2019-10-25 NOTE — Progress Notes (Signed)
KYRIAN, STAGE ([id number]) Visit Report for 10/25/2019 HPI Details Patient Name: Date of Service: LEROI, HAQUE 10/25/2019 2:45 PM Medical Record [id number] Patient Account Number: [id number] Date of Birth/Sex: Treating RN: 10-19-1931 (87 y.o. [name]) Carlene Coria Primary Care Provider: [name] Other Clinician: Referring Provider: Treating Provider/Extender:Sayana Salley, Gilmer Mor, Mayford Knife in Treatment: 0 History of Present Illness HPI Description: Admission 10/25/2019 This is an 86 year old man reasonably independent man who lives alone. 2 weeks ago he noticed a burning and itching sensation in his right buttock. By the time his daughter became aware of it this apparent abscess had already broken down. He saw his primary doctor and was put on a course of select the cephalexin which she is finished. He has been washing this with soap and water and covering with a nonstick gauze. The patient is ambulatory does not have a prior wound history Past medical history; anemia, metabolic syndrome, hearing loss, hypertension, eczema, osteoarthritis, chronic kidney disease stage III, BPH, chronic pain syndrome. Electronic Signature(s) Signed: 10/25/2019 5:53:09 PM By: Linton Ham MD Entered By: Linton Ham on 10/25/2019 16:56:00 -------------------------------------------------------------------------------- Physical Exam Details Patient Name: Date of Service: LATONYA, KNIGHT 10/25/2019 2:45 PM Medical Record [id number] Patient Account Number: [id number] Date of Birth/Sex: Treating RN: 20-Feb-1932 (87 y.o. [name] Primary Care Provider: [name] Other Clinician: Referring Provider: Treating Provider/Extender:Polina Burmaster, Gilmer Mor, Mayford Knife in Treatment: 0 Constitutional Patient is hypertensive.. Pulse regular and within target range for patient.Marland Kitchen Respirations regular, non-labored and within target range.. Temperature is normal and within the  target range for the patient.Marland Kitchen Appears in no distress. Notes Wound exam; the areas on the lower part of the right mid buttock. Wound that for the most part is superficial although there is a deeper area at roughly 4:00. There is no undermining. No evidence of infection no surrounding tenderness or irritation. Electronic Signature(s) Signed: 10/25/2019 5:53:09 PM By: Linton Ham MD Entered By: Linton Ham on 10/25/2019 16:56:41 -------------------------------------------------------------------------------- Physician Orders Details Patient Name: Date of Service: JORDEN, MINCHEY 10/25/2019 2:45 PM Medical Record [id number] Patient Account Number: [id number] Date of Birth/Sex: Treating RN: Dec 22, 1931 (87 y.o. [name] Primary Care Provider: [name] Other Clinician: Referring Provider: Treating Provider/Extender:Aneta Hendershott, Gilmer Mor, Mayford Knife in Treatment: 0 Verbal / Phone Orders: No Diagnosis Coding Follow-up Appointments Return Appointment in 1 week. Dressing Change Frequency Change Dressing every other day. Wound Cleansing May shower and wash wound with soap and water. - on dressing change day Primary Wound Dressing Collagen - moistened with hydrogel or KY jelly Secondary Dressing Foam Border Electronic Signature(s) Signed: 10/25/2019 5:53:09 PM By: Linton Ham MD Signed: 10/25/2019 5:59:23 PM By: Carlene Coria RN Entered By: Carlene Coria on 10/25/2019 16:34:35 -------------------------------------------------------------------------------- Problem List Details Patient Name: Date of Service: DONNELL, WION 10/25/2019 2:45 PM Medical Record [id number] Patient Account Number: [id number] Date of Birth/Sex: Treating RN: Mar 23, 1932 (87 y.o. 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 [name]) Carlene Coria Primary Care Provider: Stephanie Acre,  Ivin Booty Other Clinician: Referring Provider: Treating Provider/Extender:Darus Hershman, Gilmer Mor, Mayford Knife in Treatment: 0 Subjective History of Present Illness (HPI) Admission 10/25/2019 This is an 86 year old man reasonably independent man who lives alone. 2 weeks ago he noticed a burning and itching sensation in his right buttock. By the time his daughter became aware of it this apparent abscess had already broken down. He saw his primary doctor and was put on a course of select the cephalexin which she is finished. He has been washing this with soap and water and covering with a nonstick gauze. The patient is ambulatory does not have a prior wound history Past medical history; anemia, metabolic syndrome, hearing loss, hypertension, eczema, osteoarthritis, chronic kidney disease stage III, BPH, chronic pain syndrome. Patient History Information obtained from Patient. Allergies No Known Allergies Family History Cancer - Siblings, No family history of Diabetes, Heart Disease, Hereditary Spherocytosis, Hypertension, Kidney Disease, Lung Disease, Seizures, Stroke, Thyroid Problems, Tuberculosis. Social History Never smoker, Marital Status - Widowed, Alcohol Use -  Never, Drug Use - No History, Caffeine Use - Daily - coffee. Medical History Hematologic/Lymphatic Patient has history of Anemia, Sickle Cell Disease - sickle cell trait Cardiovascular Patient has history of Hypertension Integumentary (Skin) Denies history of History of Burn Musculoskeletal Patient has history of Osteoarthritis Denies history of Gout, Rheumatoid Arthritis, Osteomyelitis Oncologic Denies history of Received Chemotherapy, Received Radiation Hospitalization/Surgery History - right knee replacement. - tonsilectomy. Medical And Surgical History Notes Cardiovascular hypercholesteremia Genitourinary CKD stage 3, BPH Musculoskeletal DDD, arthritis cervical spine Review of Systems (ROS) Constitutional Symptoms (General Health) Denies complaints or symptoms of Fatigue, Fever, Chills, Marked Weight Change. Eyes Complains or has symptoms of Glasses / Contacts. Ear/Nose/Mouth/Throat Denies complaints or symptoms of Chronic sinus problems or rhinitis. Respiratory Denies complaints or symptoms of Chronic or frequent coughs, Shortness of Breath. Gastrointestinal Denies complaints or symptoms of Frequent diarrhea, Nausea, Vomiting. Endocrine Denies complaints or symptoms of Heat/cold intolerance. Integumentary (Skin) Complains or has symptoms of Wounds - right buttock. Neurologic Denies complaints or symptoms of Numbness/parasthesias. Psychiatric Denies complaints or symptoms of Claustrophobia, Suicidal. Objective Constitutional Patient is hypertensive.. Pulse regular and within target range for patient.Marland Kitchen Respirations regular, non-labored and within target range.. Temperature is normal and within the target range for the patient.Marland Kitchen Appears in no distress. Vitals Time Taken: 3:27 PM, Height: 66 in, Source: Stated, Weight: 140 lbs, Source: Stated, BMI: 22.6, Temperature: 97.5 F, Pulse: 66 bpm, Respiratory Rate: 18 breaths/min, Blood Pressure: 152/63 mmHg. General Notes:  Wound exam; the areas on the lower part of the right mid buttock. Wound that for the most part is superficial although there is a deeper area at roughly 4:00. There is no undermining. No evidence of infection no surrounding tenderness or irritation. Integumentary (Hair, Skin) Wound #1 status is Open. Original cause of wound was Bump. The wound is located on the Right Gluteus. The wound measures 1.7cm length x 1.3cm width x 0.3cm depth; 1.736cm^2 area and 0.521cm^3 volume. There is Fat Layer (Subcutaneous Tissue) Exposed exposed. There is no tunneling or undermining noted. There is a small amount of serosanguineous drainage noted. The wound margin is flat and intact. There is large (67-100%) red granulation within the wound bed. There is a small (1-33%) amount of necrotic tissue within the wound bed including Adherent Slough. Assessment Active Problems ICD-10 Cutaneous abscess of buttock Non-pressure chronic ulcer of buttock with fat layer exposed Plan Follow-up Appointments: Return Appointment in 1 week. Dressing Change Frequency: Change Dressing every other day. Wound Cleansing: May  shower and wash wound with soap and water. - on dressing change day Primary Wound Dressing: Collagen - moistened with hydrogel or KY jelly Secondary Dressing: Foam Border 1. Apparent abscess. This is a superficial clean wound with only a small area of increased depth. 2. No current evidence of infection 3. We will use regular collagen moistened with hydrogel or K-Y jelly change every second day covered with a border foam I spent 25 minutes on the review of this patient's records face-to-face evaluation and preparation of this record Electronic Signature(s) Signed: 10/25/2019 5:53:09 PM By: Linton Ham MD Entered By: Linton Ham on 10/25/2019 16:57:59 -------------------------------------------------------------------------------- HxROS Details Patient Name: Date of Service: TRUNG, WENZL  10/25/2019 2:45 PM Medical Record [id number] Patient Account Number: [id number] Date of Birth/Sex: Treating RN: 1931-11-11 (86 y.o. [name] Primary Care Provider: [name] Other Clinician: Referring Provider: Treating Provider/Extender:Christol Thetford, Gilmer Mor, Mayford Knife in Treatment: 0 Information Obtained From Patient Constitutional Symptoms (General Health) Complaints and Symptoms: Negative for: Fatigue; Fever; Chills; Marked Weight Change Eyes Complaints and Symptoms: Positive for: Glasses / Contacts Ear/Nose/Mouth/Throat Complaints and Symptoms: Negative for: Chronic sinus problems or rhinitis Respiratory Complaints and Symptoms: Negative for: Chronic or frequent coughs; Shortness of Breath Gastrointestinal Complaints and Symptoms: Negative for: Frequent diarrhea; Nausea; Vomiting Endocrine Complaints and Symptoms: Negative for: Heat/cold intolerance Integumentary (Skin) Complaints and Symptoms: Positive for: Wounds - right buttock Medical History: Negative for: History of Burn Neurologic Complaints and Symptoms: Negative for: Numbness/parasthesias Psychiatric Complaints and Symptoms: Negative for: Claustrophobia; Suicidal Hematologic/Lymphatic Medical History: Positive for: Anemia; Sickle Cell Disease - sickle cell trait Cardiovascular Medical History: Positive for: Hypertension Past Medical History Notes: hypercholesteremia Genitourinary Medical History: Past Medical History Notes: CKD stage 3, BPH Immunological Musculoskeletal Medical History: Positive for: Osteoarthritis Negative for: Gout; Rheumatoid Arthritis; Osteomyelitis Past Medical History Notes: DDD, arthritis cervical spine Oncologic Medical History: Negative for: Received Chemotherapy; Received Radiation Immunizations Pneumococcal Vaccine: Received Pneumococcal Vaccination: Yes Implantable Devices No devices added Hospitalization / Surgery History Type of  Hospitalization/Surgery right knee replacement tonsilectomy Family and Social History Cancer: Yes - Siblings; Diabetes: No; Heart Disease: No; Hereditary Spherocytosis: No; Hypertension: No; Kidney Disease: No; Lung Disease: No; Seizures: No; Stroke: No; Thyroid Problems: No; Tuberculosis: No; Never smoker; Marital Status - Widowed; Alcohol Use: Never; Drug Use: No History; Caffeine Use: Daily - coffee; Financial Concerns: No; Food, Clothing or Shelter Needs: No; Support System Lacking: No; Transportation Concerns: No Engineer, maintenance) Signed: 10/25/2019 5:53:09 PM By: Linton Ham MD Signed: 10/25/2019 5:59:14 PM By: Baruch Gouty RN, BSN Entered By: Baruch Gouty on 10/25/2019 15:36:53 -------------------------------------------------------------------------------- [place] Details Patient Name: Date of Service: SAJAD, GLANDER 10/25/2019 Medical Record [id number] Patient Account Number: [id number] Date of Birth/Sex: Treating RN: Mar 07, 1932 (87 y.o. [name] Primary Care Provider: [name] Other Clinician: Referring Provider: Treating Provider/Extender:Shakema Surita, Gilmer Mor, Mayford Knife in Treatment: 0 Diagnosis Coding ICD-10 Codes Code Description L02.31 Cutaneous abscess of buttock L98.412 Non-pressure chronic ulcer of buttock with fat layer exposed Facility Procedures CPT4 Code: [id number] Description: 99214 - WOUND CARE VISIT-LEV 4 EST PT Modifier: Quantity: 1 Physician Procedures CPT4 Code: 7371062 Description: 69485 - WC PHYS LEVEL 2 - NEW PT ICD-10 Diagnosis Description L02.31 Cutaneous abscess of buttock L98.412 Non-pressure chronic ulcer of buttock with fat laye Modifier: r exposed Quantity: 1 Electronic Signature(s) Signed: 10/25/2019 5:53:09 PM By: Linton Ham MD Signed: 10/25/2019 5:59:23 PM By: Carlene Coria RN Entered By: Carlene Coria on 10/25/2019 17:02:34

## 2019-10-25 NOTE — Progress Notes (Addendum)
Chad [name], Chad [name] ([id number]) Visit Report for 10/25/2019 Allergy List Details Patient Name: Date of Service: Chad [name], Chad [name] 10/25/2019 2:45 PM Medical Record [id number] Patient Account Number: [id number] Date of Birth/Sex: Treating RN: [birth year] (86 y.o. Male) Chad [name] Primary Care Chad [name]: Chad [name] Other Clinician: Referring Chad [name]: Treating Chad [name]/Extender:Robson, Gilmer Mor, Mayford Knife in Treatment: 0 Allergies Active Allergies No Known Allergies Allergy Notes Electronic Signature(s) Signed: 10/25/2019 5:59:14 PM By: Chad Gouty RN, BSN Entered By: Chad [name] on 10/25/2019 15:28:52 -------------------------------------------------------------------------------- [place] Details Patient Name: Date of Service: Chad [name], Chad [name] 10/25/2019 2:45 PM Medical Record [id number] Patient Account Number: [id number] Date of Birth/Sex: Treating RN: [birth year] (86 y.o. Male) Chad [name] Primary Care Chad [name]: Chad [name] Other Clinician: Referring Chad [name]: Treating Chad [name]/Extender:Robson, Gilmer Mor, Mayford Knife in Treatment: 0 Visit Information Patient Arrived: Chad [name] Arrival Time: 15:21 Accompanied By: daughter Transfer Assistance: None Patient Identification Verified: Yes Secondary Verification Process Completed: Yes Patient Requires Transmission-Based No Precautions: Patient Has Alerts: No Electronic Signature(s) Signed: 10/25/2019 5:59:23 PM By: Chad Coria RN Entered By: Chad [name] on 10/25/2019 16:51:56 -------------------------------------------------------------------------------- Clinic Level of Care Assessment Details Patient Name: Date of Service: Chad [name], Chad [name] 10/25/2019 2:45 PM Medical Record [id number] Patient Account Number: [id number] Date of Birth/Sex: Treating RN: 1932-02-24 (86 y.o. Male) Chad [name] Primary Care Chad [name]: Chad [name] Other Clinician: Referring Chad [name]:  Treating Chad [name]/Extender:Robson, Gilmer Mor, Mayford Knife in Treatment: 0 Clinic Level of Care Assessment Items TOOL 2 Quantity Score X - Use when only an EandM is performed on the INITIAL visit 1 0 ASSESSMENTS - Nursing Assessment / Reassessment X - General Physical Exam (combine w/ comprehensive assessment (listed just below) 1 20 when performed on new pt. evals) X - Comprehensive Assessment (HX, ROS, Risk Assessments, Wounds Hx, etc.) 1 25 ASSESSMENTS - Wound and Skin Assessment / Reassessment X - Simple Wound Assessment / Reassessment - one wound 1 5 []  - Complex Wound Assessment / Reassessment - multiple wounds 0 []  - Dermatologic / Skin Assessment (not related to wound area) 0 ASSESSMENTS - Ostomy and/or Continence Assessment and Care []  - Incontinence Assessment and Management 0 []  - Ostomy Care Assessment and Management (repouching, etc.) 0 PROCESS - Coordination of Care X - Simple Patient / Family Education for ongoing care 1 15 []  - Complex (extensive) Patient / Family Education for ongoing care 0 X - Staff obtains Consents, Records, Test Results / Process Orders 1 10 []  - Staff telephones HHA, Nursing Homes / Clarify orders / etc 0 []  - Routine Transfer to another Facility (non-emergent condition) 0 []  - Routine Hospital Admission (non-emergent condition) 0 X - New Admissions / Biomedical engineer / Ordering NPWT, Apligraf, etc. 1 15 []  - Emergency Hospital Admission (emergent condition) 0 X - Simple Discharge Coordination 1 10 []  - Complex (extensive) Discharge Coordination 0 PROCESS - Special Needs []  - Pediatric / Minor Patient Management 0 []  - Isolation Patient Management 0 []  - Hearing / Language / Visual special needs 0 []  - Assessment of Community assistance (transportation, D/C planning, etc.) 0 []  - Additional assistance / Altered mentation 0 []  - Support Surface(s) Assessment (bed, cushion, seat, etc.) 0 INTERVENTIONS - Wound Cleansing /  Measurement X - Wound Imaging (photographs - any number of wounds) 1 5 []  - Wound Tracing (instead of photographs) 0 X - Simple Wound Measurement - one wound 1 5 []  - Complex Wound Measurement - multiple wounds 0 X - Simple Wound Cleansing - one wound 1 5 []  - Complex Wound Cleansing - multiple wounds 0 INTERVENTIONS -  Wound Dressings []  - Small Wound Dressing one or multiple wounds 0 X - Medium Wound Dressing one or multiple wounds 1 15 []  - Large Wound Dressing one or multiple wounds 0 []  - Application of Medications - injection 0 INTERVENTIONS - Miscellaneous []  - External ear exam 0 []  - Specimen Collection (cultures, biopsies, blood, body fluids, etc.) 0 []  - Specimen(s) / Culture(s) sent or taken to Lab for analysis 0 []  - Patient Transfer (multiple staff / [name] / Similar devices) 0 []  - Simple Staple / Suture removal (25 or less) 0 []  - Complex Staple / Suture removal (26 or more) 0 []  - Hypo / Hyperglycemic Management (close monitor of Blood Glucose) 0 X - Ankle / Brachial Index (ABI) - do not check if billed separately 1 15 Has the patient been seen at the hospital within the last three years: Yes Total Score: 145 Level Of Care: New/Established - Level 4 Electronic Signature(s) Signed: 10/25/2019 5:59:23 PM By: Chad Coria RN Entered By: Chad [name] on 10/25/2019 16:35:39 -------------------------------------------------------------------------------- Encounter Discharge Information Details Patient Name: Date of Service: Chad [name], Chad [name] 10/25/2019 2:45 PM Medical Record [id number] Patient Account Number: [id number] Date of Birth/Sex: Treating RN: [birth year] (86 y.o. Male) Kela Millin Primary Care Miki Blank: Chad [name] Other Clinician: Referring Darneshia Demary: Treating Kyi Romanello/Extender:Robson, Gilmer Mor, Mayford Knife in Treatment: 0 Encounter Discharge Information Items Discharge Condition: Stable Ambulatory Status: Ambulatory Discharge  Destination: Home Transportation: Private Auto Accompanied By: family member Schedule Follow-up Appointment: Yes Clinical Summary of Care: Patient Declined Electronic Signature(s) Signed: 10/25/2019 5:48:19 PM By: Kela Millin Entered By: Kela Millin on 10/25/2019 16:46:30 -------------------------------------------------------------------------------- Lower Extremity Assessment Details Patient Name: Date of Service: Chad [name], Chad [name] 10/25/2019 2:45 PM Medical Record [id number] Patient Account Number: [id number] Date of Birth/Sex: Treating RN: 1931/11/29 (87 y.o. Male) Chad [name] Primary Care Deliliah Spranger: Chad [name] Other Clinician: Referring Sonnie Bias: Treating Yousef Huge/Extender:Robson, Gilmer Mor, Mayford Knife in Treatment: 0 Electronic Signature(s) Signed: 10/25/2019 5:59:14 PM By: Chad Gouty RN, BSN Entered By: Chad [name] on 10/25/2019 15:39:47 -------------------------------------------------------------------------------- Multi Wound Chart Details Patient Name: Date of Service: Chad [name], Chad [name] 10/25/2019 2:45 PM Medical Record [id number] Patient Account Number: [id number] Date of Birth/Sex: Treating RN: [birth year] (87 y.o. Male) Chad [name] Primary Care Jozlyn Schatz: Chad [name] Other Clinician: Referring Guerino Caporale: Treating Isiac Breighner/Extender:Robson, Gilmer Mor, Mayford Knife in Treatment: 0 Vital Signs Height(in): 66 Pulse(bpm): 38 Weight(lbs): 140 Blood Pressure(mmHg): 152/63 Body Mass Index(BMI): 23 Temperature(F): 97.5 Respiratory 18 Rate(breaths/min): Photos: [1:No Photos] [N/A:N/A] Wound Location: [1:Right Gluteus] [N/A:N/A] Wounding Event: [1:Bump] [N/A:N/A] Primary Etiology: [1:Abscess] [N/A:N/A] Comorbid History: [1:Anemia, Sickle Cell Disease, Hypertension, Osteoarthritis] [N/A:N/A] Date Acquired: [1:10/03/2019] [N/A:N/A] Weeks of Treatment: [1:0] [N/A:N/A] Wound Status: [1:Open] [N/A:N/A] Measurements L x W x  D 1.7x1.3x0.3 [N/A:N/A] (cm) Area (cm) : [1:1.736] [N/A:N/A] Volume (cm) : [1:0.521] [N/A:N/A] Classification: [1:Full Thickness Without Exposed Support Structures] [N/A:N/A] Exudate Amount: [1:Small] [N/A:N/A] Exudate Type: [1:Serosanguineous] [N/A:N/A] Exudate Color: [1:red, brown] [N/A:N/A] Wound Margin: [1:Flat and Intact] [N/A:N/A] Granulation Amount: [1:Large (67-100%)] [N/A:N/A] Granulation Quality: [1:Red] [N/A:N/A] Necrotic Amount: [1:Small (1-33%)] [N/A:N/A] Exposed Structures: [1:Fat Layer (Subcutaneous N/A Tissue) Exposed: Yes Fascia: No Tendon: No Muscle: No Joint: No Bone: No Small (1-33%)] [N/A:N/A] Treatment Notes Wound #1 (Right Gluteus) 1. Cleanse With Wound Cleanser 2. Periwound Care Skin Prep 3. Primary Dressing Applied Collagen Hydrogel or K-Y Jelly 4. Secondary Dressing Foam Border Dressing Electronic Signature(s) Signed: 10/25/2019 5:53:09 PM By: Linton Ham MD Signed: 10/25/2019 5:59:23 PM By: Chad Coria RN Entered By: Linton Ham on 10/25/2019 16:53:48 -------------------------------------------------------------------------------- [place] Details Patient  Name: Date of Service: Chad [name], Chad [name] 10/25/2019 2:45 PM Medical Record [id number] Patient Account Number: [id number] Date of Birth/Sex: Treating RN: [birth year] (86 y.o. Male) Chad [name] Primary Care Jaecion Dempster: Chad [name] Other Clinician: Referring Martia Dalby: Treating Taliana Mersereau/Extender:Robson, Gilmer Mor, Mayford Knife in Treatment: 0 Active Inactive Wound/Skin Impairment Nursing Diagnoses: Knowledge deficit related to ulceration/compromised skin integrity Goals: Patient/caregiver will verbalize understanding of skin care regimen Date Initiated: 10/25/2019 Target Resolution Date: 11/25/2019 Goal Status: Active Ulcer/skin breakdown will have a volume reduction of 30% by week 4 Date Initiated: 10/25/2019 Target Resolution Date: 11/25/2019 Goal  Status: Active Interventions: Assess patient/caregiver ability to obtain necessary supplies Assess patient/caregiver ability to perform ulcer/skin care regimen upon admission and as needed Assess ulceration(s) every visit Notes: Electronic Signature(s) Signed: 10/25/2019 5:59:23 PM By: Chad Coria RN Entered By: Chad [name] on 10/25/2019 16:31:58 -------------------------------------------------------------------------------- Pain Assessment Details Patient Name: Date of Service: Chad [name], Chad [name] 10/25/2019 2:45 PM Medical Record [id number] Patient Account Number: [id number] Date of Birth/Sex: Treating RN: [birth year] (87 y.o. Male) Chad [name] Primary Care Alliya Marcon: Chad [name] Other Clinician: Referring Sherma Vanmetre: Treating Arda Daggs/Extender:Robson, Gilmer Mor, Mayford Knife in Treatment: 0 Active Problems Location of Pain Severity and Description of Pain Patient Has Paino Yes Site Locations Pain Location: Pain in Ulcers Rate the pain. Current Pain Level: 2 Character of Pain Describe the Pain: Tender Pain Management and Medication Current Pain Management: Other: reposition Is the Current Pain Management Adequate: Adequate How does your wound impact your activities of daily livingo Sleep: No Bathing: No Appetite: No Relationship With Others: No Bladder Continence: No Emotions: No Bowel Continence: No Work: No Toileting: No Drive: No Dressing: No Hobbies: No Electronic Signature(s) Signed: 10/25/2019 5:59:14 PM By: Chad Gouty RN, BSN Entered By: Chad [name] on 10/25/2019 15:47:28 -------------------------------------------------------------------------------- Patient/Caregiver Education Details Patient Name: Date of Service: Chad [name] 3/23/2021andnbsp2:45 PM Medical Record [phone] Patient Account Number: [id number] Date of Birth/Gender: Treating RN: [birth year] (87 y.o. Male) Chad [name] Primary Care Physician: Chad [name] Other Clinician: Referring Physician: Treating Physician/Extender:Robson, Gilmer Mor, Mayford Knife in Treatment: 0 Education Assessment Education Provided To: Patient Education Topics Provided Wound/Skin Impairment: Methods: Explain/Verbal Responses: State content correctly Electronic Signature(s) Signed: 10/25/2019 5:59:23 PM By: Chad Coria RN Entered By: Chad [name] on 10/25/2019 16:32:46 -------------------------------------------------------------------------------- Wound Assessment Details Patient Name: Date of Service: Chad [name], Chad [name] 10/25/2019 2:45 PM Medical Record [id number] Patient Account Number: [id number] Date of Birth/Sex: Treating RN: Jun 16, 1932 (87 y.o. Male) Chad [name] Primary Care Hairo Garraway: Chad [name] Other Clinician: Referring Nickisha Hum: Treating Jaymon Dudek/Extender:Robson, Gilmer Mor, Mayford Knife in Treatment: 0 Wound Status Wound Number: 1 Primary Abscess Etiology: Wound Location: Right Gluteus Wound Open Wounding Event: Bump Status: Date Acquired: 10/03/2019 Comorbid Anemia, Sickle Cell Disease, Weeks Of Treatment: 0 History: Hypertension, Osteoarthritis Clustered Wound: No Photos Wound Measurements Length: (cm) 1.7 % Reduct Width: (cm) 1.3 % Reduct Depth: (cm) 0.3 Epitheli Area: (cm) 1.736 Tunneli Volume: (cm) 0.521 Undermi Wound Description Classification: Full Thickness Without Exposed Support Foul Odo Structures Slough/F Wound Flat and Intact Margin: Exudate Small Amount: Exudate Serosanguineous Type: Exudate red, brown Color: Wound Bed Granulation Amount: Large (67-100%) Granulation Quality: Red Fascia E Necrotic Amount: Small (1-33%) Fat Laye Necrotic Quality: Adherent Slough Tendon E Muscle E Joint Ex Bone Exp r After Cleansing: No ibrino No Exposed Structure xposed: No r (Subcutaneous Tissue) Exposed: Yes xposed: No xposed: No posed: No osed: No ion in Area: 0% ion in Volume:  0% alization: Small (1-33%) ng: No ning: No Treatment Notes Wound #1 (Right Gluteus) 1. Cleanse With Wound  Cleanser 2. Periwound Care Skin Prep 3. Primary Dressing Applied Collagen Hydrogel or K-Y Jelly 4. Secondary Dressing Foam Border Dressing Electronic Signature(s) Signed: 10/27/2019 4:51:02 PM By: Mikeal Hawthorne EMT/HBOT Signed: 10/28/2019 5:43:34 PM By: Chad Coria RN Previous Signature: 10/25/2019 5:59:14 PM Version By: Chad Gouty RN, BSN Entered By: Mikeal Hawthorne on 10/27/2019 13:08:32 -------------------------------------------------------------------------------- [place] Details Patient Name: Date of Service: Chad [name], Chad [name] 10/25/2019 2:45 PM Medical Record [id number] Patient Account Number: [id number] Date of Birth/Sex: Treating RN: [birth year] (87 y.o. Male) Chad [name] Primary Care Reuel Lamadrid: Chad [name] Other Clinician: Referring Sybol Morre: Treating Hollye Pritt/Extender:Robson, Gilmer Mor, Mayford Knife in Treatment: 0 Vital Signs Time Taken: 15:27 Temperature (F): 97.5 Height (in): 66 Pulse (bpm): 66 Source: Stated Respiratory Rate (breaths/min): 18 Weight (lbs): 140 Blood Pressure (mmHg): 152/63 Source: Stated Reference Range: 80 - 120 mg / dl Body Mass Index (BMI): 22.6 Electronic Signature(s) Signed: 10/25/2019 5:59:14 PM By: Chad Gouty RN, BSN Entered By: Chad [name] on 10/25/2019 15:28:37

## 2019-10-25 NOTE — Progress Notes (Signed)
Chad, [name] ([id number]) Visit Report for 10/25/2019 Abuse/Suicide Risk Screen Details Patient Name: Date of Service: Chad [name], Chad [name] 10/25/2019 2:45 PM Medical Record [id number] Patient Account Number: [id number] Date of Birth/Sex: Treating RN: 07-Jun-1932 (86 y.o. [name] Primary Care Keziah Drotar: [name] Other Clinician: Referring Ronal Maybury: Treating Everlina Gotts/Extender:Robson, Gilmer Mor, Mayford Knife in Treatment: 0 Abuse/Suicide Risk Screen Items Answer ABUSE RISK SCREEN: Has anyone close to you tried to hurt or harm you recentlyo No Do you feel uncomfortable with anyone in your familyo No Has anyone forced you do things that you didnt want to doo No Electronic Signature(s) Signed: 10/25/2019 5:59:14 PM By: Baruch Gouty RN, BSN Entered By: Baruch Gouty on 10/25/2019 15:37:00 -------------------------------------------------------------------------------- Activities of Daily Living Details Patient Name: Date of Service: Chad [name], Chad [name] 10/25/2019 2:45 PM Medical Record [id number] Patient Account Number: [id number] Date of Birth/Sex: Treating RN: 08/16/1931 (86 y.o. [name] Primary Care Dianne Whelchel: [name] Other Clinician: Referring Makayli Bracken: Treating Ory Elting/Extender:Robson, Gilmer Mor, Mayford Knife in Treatment: 0 Activities of Daily Living Items Answer Activities of Daily Living (Please select one for each item) Drive Automobile Completely Able Take Medications Completely Able Use Telephone Completely Able Care for Appearance Completely Able Use Toilet Completely Able Bath / Shower Completely Able Dress Self Completely Able Feed Self Completely Able Walk Need Assistance Get In / Out Bed Completely Able Housework Completely Able Prepare Meals Completely [place] for Self Completely Able Electronic Signature(s) Signed: 10/25/2019 5:59:14 PM By: Baruch Gouty RN,  BSN Entered By: Baruch Gouty on 10/25/2019 15:37:31 -------------------------------------------------------------------------------- Education Screening Details Patient Name: Date of Service: Chad [name], Chad [name] 10/25/2019 2:45 PM Medical Record [id number] Patient Account Number: [id number] Date of Birth/Sex: Treating RN: Oct 19, 1931 (86 y.o. [name] Primary Care Emmalin Jaquess: [name] Other Clinician: Referring [place]: Treating Fusae Florio/Extender:Robson, Gilmer Mor, Mayford Knife in Treatment: 0 Primary Learner Assessed: Patient Learning Preferences/Education Level/Primary Language Learning Preference: Explanation, Demonstration, Printed Material Highest Education Level: High School Preferred Language: English Cognitive Barrier Language Barrier: No Translator Needed: No Memory Deficit: No Emotional Barrier: No Cultural/Religious Beliefs Affecting Medical Care: No Physical Barrier Impaired Vision: Yes Glasses Impaired Hearing: No Decreased Hand dexterity: No Knowledge/Comprehension Knowledge Level: High Comprehension Level: High Ability to understand written High instructions: Ability to understand verbal High instructions: Motivation Anxiety Level: Calm Cooperation: Cooperative Education Importance: Acknowledges Need Interest in Health Problems: Asks Questions Perception: Coherent Willingness to Engage in Self- High Management Activities: Readiness to Engage in Self- High Management Activities: Electronic Signature(s) Signed: 10/25/2019 5:59:14 PM By: Baruch Gouty RN, BSN Entered By: Baruch Gouty on 10/25/2019 15:38:19 -------------------------------------------------------------------------------- Fall Risk Assessment Details Patient Name: Date of Service: Chad [name], Chad [name] 10/25/2019 2:45 PM Medical Record [id number] Patient Account Number: [id number] Date of Birth/Sex: Treating RN: 03/23/1932 (86 y.o. [name] Primary Care Hades Mathew: [name] Other Clinician: Referring Deseri Loss: Treating Kim Lauver/Extender:Robson, Gilmer Mor, Mayford Knife in Treatment: 0 Fall Risk Assessment Items Have you had 2 or more falls in the last 12 monthso 0 No Have you had any fall that resulted in injury in the last 12 monthso 0 No FALLS RISK SCREEN History of falling - immediate or within 3 months 0 No Secondary diagnosis (Do you have 2 or more medical diagnoseso) 0 No Ambulatory aid None/bed rest/wheelchair/nurse 0 No Crutches/cane/Chad [name] 15 Yes Furniture 0 No Intravenous therapy Access/Saline/Heparin Lock 0 No Weak (short steps with or without shuffle, stooped but able to lift head 0 No while walking, may seek support from furniture) Impaired (short steps with shuffle, may  have difficulty arising from chair, 0 No head down, impaired balance) Mental Status Oriented to own ability 0 Yes Overestimates or forgets limitations 0 No Risk Level: Low Risk Score: 15 Electronic Signature(s) Signed: 10/25/2019 5:59:14 PM By: Baruch Gouty RN, BSN Entered By: Baruch Gouty on 10/25/2019 15:38:37 -------------------------------------------------------------------------------- Foot Assessment Details Patient Name: Date of Service: Chad [name], Chad [name] 10/25/2019 2:45 PM Medical Record [id number] Patient Account Number: [id number] Date of Birth/Sex: Treating RN: 01/19/1932 (86 y.o. [name] Primary Care Connie Lasater: [name] Other Clinician: Referring Gilbert Narain: Treating Danell Vazquez/Extender:Robson, Gilmer Mor, Mayford Knife in Treatment: 0 Foot Assessment Items Site Locations + = Sensation present, - = Sensation absent, C = Callus, U = Ulcer R = Redness, W = Warmth, M = Maceration, PU = Pre-ulcerative lesion F = Fissure, S = Swelling, D = Dryness Assessment Right: Left: Other Deformity: No No Prior Foot Ulcer: No No Prior Amputation: No No Charcot Joint: No No Ambulatory  Status: Ambulatory With Help Assistance Device: Cane Gait: Steady Electronic Signature(s) Signed: 10/25/2019 5:59:14 PM By: Baruch Gouty RN, BSN Entered By: Baruch Gouty on 10/25/2019 15:39:41 -------------------------------------------------------------------------------- Nutrition Risk Screening Details Patient Name: Date of Service: Chad [name], Chad [name] 10/25/2019 2:45 PM Medical Record [id number] Patient Account Number: [id number] Date of Birth/Sex: Treating RN: 31-May-1932 (86 y.o. [name] Primary Care Lourine Alberico: [name] Other Clinician: Referring Faythe Heitzenrater: Treating Seydou Hearns/Extender:Robson, Gilmer Mor, Mayford Knife in Treatment: 0 Height (in): 66 Weight (lbs): 140 Body Mass Index (BMI): 22.6 Nutrition Risk Screening Items Score Screening NUTRITION RISK SCREEN: I have an illness or condition that made me change the kind and/or 0 No amount of food I eat I eat fewer than two meals per day 0 No I eat few fruits and vegetables, or milk products 0 No I have three or more drinks of beer, liquor or wine almost every day 0 No I have tooth or mouth problems that make it hard for me to eat 0 No I don't always have enough money to buy the food I need 0 No I eat alone most of the time 1 Yes I take three or more different prescribed or over-the-counter drugs a day 1 Yes 0 No Without wanting to, I have lost or gained 10 pounds in the last six months I am not always physically able to shop, cook and/or feed myself 0 No Nutrition Protocols Good Risk Protocol 0 No interventions needed Moderate Risk Protocol High Risk Proctocol Risk Level: Good Risk Score: 2 Electronic Signature(s) Signed: 10/25/2019 5:59:14 PM By: Baruch Gouty RN, BSN Entered By: Baruch Gouty on 10/25/2019 15:39:30

## 2019-10-27 DIAGNOSIS — S31811A Laceration without foreign body of right buttock, initial encounter: Secondary | ICD-10-CM | POA: Diagnosis not present

## 2019-11-01 ENCOUNTER — Encounter (HOSPITAL_BASED_OUTPATIENT_CLINIC_OR_DEPARTMENT_OTHER): Payer: Medicare HMO | Admitting: Internal Medicine

## 2019-11-09 ENCOUNTER — Other Ambulatory Visit: Payer: Self-pay

## 2019-11-09 ENCOUNTER — Encounter (HOSPITAL_BASED_OUTPATIENT_CLINIC_OR_DEPARTMENT_OTHER): Payer: Medicare HMO | Attending: Physician Assistant | Admitting: Physician Assistant

## 2019-11-09 DIAGNOSIS — L98412 Non-pressure chronic ulcer of buttock with fat layer exposed: Secondary | ICD-10-CM | POA: Insufficient documentation

## 2019-11-09 DIAGNOSIS — M1990 Unspecified osteoarthritis, unspecified site: Secondary | ICD-10-CM | POA: Diagnosis not present

## 2019-11-09 DIAGNOSIS — N183 Chronic kidney disease, stage 3 unspecified: Secondary | ICD-10-CM | POA: Diagnosis not present

## 2019-11-09 DIAGNOSIS — G894 Chronic pain syndrome: Secondary | ICD-10-CM | POA: Diagnosis not present

## 2019-11-09 DIAGNOSIS — I129 Hypertensive chronic kidney disease with stage 1 through stage 4 chronic kidney disease, or unspecified chronic kidney disease: Secondary | ICD-10-CM | POA: Diagnosis not present

## 2019-11-09 NOTE — Progress Notes (Signed)
Chad [name], Chad [name] ([id number]) Visit Report for 11/09/2019 Arrival Information Details Patient Name: Date of Service: Chad [name], Chad [name] 11/09/2019 3:00 PM Medical Record [id number] Patient Account Number: [id number] Date of Birth/Sex: Treating RN: [birth year] (87 y.o. Chad [name]) Chad [name] Primary Care Chad [name]: Chad [name] Other Clinician: Referring Navaeh Kehres: Treating Chad [name]/Extender:Chad [name], Chad [name], Chad [name] in Treatment: 2 Visit Information History Since Last Visit All ordered tests and consults were completed: No Patient Arrived: Ambulatory Added or deleted any medications: No Arrival Time: 15:13 Any new allergies or adverse reactions: No Accompanied By: daughter Had a fall or experienced change in No Transfer Assistance: None activities of daily living that may affect Patient Identification Verified: Yes risk of falls: Secondary Verification Process Yes Signs or symptoms of abuse/neglect since last No Completed: visito Patient Requires Transmission-Based No Hospitalized since last visit: No Precautions: Implantable device outside of the clinic excluding No Patient Has Alerts: No cellular tissue based products placed in the center since last visit: Has Dressing in Place as Prescribed: Yes Has Compression in Place as Prescribed: Yes Pain Present Now: No Electronic Signature(s) Signed: 11/09/2019 5:52:27 PM By: Chad Coria RN Entered By: Chad [name] on 11/09/2019 15:14:17 -------------------------------------------------------------------------------- Encounter Discharge Information Details Patient Name: Date of Service: Chad [name], Chad [name] 11/09/2019 3:00 PM Medical Record [id number] Patient Account Number: [id number] Date of Birth/Sex: Treating RN: Mar 05, 1932 (86 y.o. Chad [name] Primary Care Lunabelle Oatley: Chad [name] Other Clinician: Referring Zelda Reames: Treating Xoie Kreuser/Extender:Chad [name], Chad [name], Chad [name] in Treatment: 2 Encounter  Discharge Information Items Discharge Condition: Stable Ambulatory Status: Ambulatory Discharge Destination: Home Transportation: Private Auto Accompanied By: Chad [name] Schedule Follow-up Appointment: Yes Clinical Summary of Care: Patient Declined Electronic Signature(s) Signed: 11/09/2019 6:07:56 PM By: Chad Gouty RN, BSN Entered By: Chad [name] on 11/09/2019 16:04:48 -------------------------------------------------------------------------------- [place] Details Patient Name: Date of Service: Chad [name], Chad [name] 11/09/2019 3:00 PM Medical Record [id number] Patient Account Number: [id number] Date of Birth/Sex: Treating RN: [birth year] (86 y.o. Chad [name] Primary Care Tineshia Becraft: Chad [name] Other Clinician: Referring Cashtyn Pouliot: Treating Leon Goodnow/Extender:Chad [name], Chad [name], Chad [name] in Treatment: 2 Active Inactive Wound/Skin Impairment Nursing Diagnoses: Knowledge deficit related to ulceration/compromised skin integrity Goals: Patient/caregiver will verbalize understanding of skin care regimen Date Initiated: 10/25/2019 Target Resolution Date: 11/25/2019 Goal Status: Active Ulcer/skin breakdown will have a volume reduction of 30% by week 4 Date Initiated: 10/25/2019 Target Resolution Date: 11/25/2019 Goal Status: Active Interventions: Assess patient/caregiver ability to obtain necessary supplies Assess patient/caregiver ability to perform ulcer/skin care regimen upon admission and as needed Assess ulceration(s) every visit Notes: Electronic Signature(s) Signed: 11/09/2019 6:07:56 PM By: Chad Gouty RN, BSN Entered By: Chad [name] on 11/09/2019 15:39:16 -------------------------------------------------------------------------------- Pain Assessment Details Patient Name: Date of Service: Chad [name], Chad [name] 11/09/2019 3:00 PM Medical Record [id number] Patient Account Number: [id number] Date of Birth/Sex: Treating RN: 07/11/1932  (87 y.o. Chad [name] Primary Care Daveena Elmore: Chad [name] Other Clinician: Referring Aymen Widrig: Treating Aquila Delaughter/Extender:Chad [name], Chad [name], Chad [name] in Treatment: 2 Active Problems Location of Pain Severity and Description of Pain Patient Has Paino No Site Locations Pain Management and Medication Current Pain Management: Electronic Signature(s) Signed: 11/09/2019 5:52:27 PM By: Chad Coria RN Entered By: Chad [name] on 11/09/2019 15:18:38 -------------------------------------------------------------------------------- Patient/Caregiver Education Details Patient Name: Date of Service: Chad [name] 4/7/2021andnbsp3:00 PM Medical Record [id number] Patient Account Number: [id number] Date of Birth/Gender: Treating RN: Jan 01, 1932 (86 y.o. Chad [name] Primary Care Physician: Chad [name] Other Clinician: Referring Physician: Treating Physician/Extender:Chad [name], Chad [name], Chad [name] in Treatment: 2 Education Assessment Education Provided To: Patient Education Topics  Provided Wound/Skin Impairment: Methods: Explain/Verbal Responses: Reinforcements needed, State content correctly Electronic Signature(s) Signed: 11/09/2019 6:07:56 PM By: Chad Gouty RN, BSN Entered By: Chad [name] on 11/09/2019 15:39:32 -------------------------------------------------------------------------------- Wound Assessment Details Patient Name: Date of Service: Chad [name], Chad [name] 11/09/2019 3:00 PM Medical Record [id number] Patient Account Number: [id number] Date of Birth/Sex: Treating RN: 03/04/1932 (87 y.o. Chad [name]) Chad [name] Primary Care Revan Gendron: Chad [name] Other Clinician: Referring Adelard Sanon: Treating Syrena Burges/Extender:Chad [name], Chad [name], Chad [name] in Treatment: 2 Wound Status Wound Number: 1 Primary Abscess Etiology: Wound Location: Right Gluteus Wound Open Wounding Event: Bump Status: Date Acquired: 10/03/2019 Comorbid Anemia,  Sickle Cell Disease, Weeks Of Treatment: 2 History: Hypertension, Osteoarthritis Clustered Wound: No Wound Measurements Length: (cm) 1.5 % Reduct Width: (cm) 1.1 % Reduct Depth: (cm) 0.1 Epitheli Area: (cm) 1.296 Tunneli Volume: (cm) 0.13 Undermi Wound Description Classification: Full Thickness Without Exposed Support Foul Od Structures Slough/ Wound Flat and Intact Margin: Exudate Small Amount: Exudate Serosanguineous Type: Exudate red, brown Color: Wound Bed Granulation Amount: Large (67-100%) Granulation Quality: Red Fascia E Necrotic Amount: Small (1-33%) Fat Laye Necrotic Quality: Adherent Slough Tendon Expo Muscle Expo Joint Expos Bone Expose or After Cleansing: No Fibrino No Exposed Structure xposed: No r (Subcutaneous Tissue) Exposed: Yes sed: No sed: No ed: No d: No ion in Area: 25.3% ion in Volume: 75% alization: Small (1-33%) ng: No ning: No Treatment Notes Wound #1 (Right Gluteus) 3. Primary Dressing Applied Hydrofera Blue 4. Secondary Dressing Foam Border Dressing Electronic Signature(s) Signed: 11/09/2019 5:52:27 PM By: Chad Coria RN Entered By: Chad [name] on 11/09/2019 15:22:29 -------------------------------------------------------------------------------- Vitals Details Patient Name: Date of Service: Chad [name], Chad [name] 11/09/2019 3:00 PM Medical Record [id number] Patient Account Number: [id number] Date of Birth/Sex: Treating RN: 10-Aug-1931 (87 y.o. Chad [name]) Chad [name] Primary Care Marlinda Miranda: Chad [name] Other Clinician: Referring Sun Kihn: Treating Alexandrya Chim/Extender:Chad [name], Chad [name], Chad [name] in Treatment: 2 Vital Signs Time Taken: 15:14 Temperature (F): 98.5 Height (in): 66 Pulse (bpm): 61 Weight (lbs): 140 Respiratory Rate (breaths/min): 18 Body Mass Index (BMI): 22.6 Blood Pressure (mmHg): 119/51 Reference Range: 80 - 120 mg / dl Electronic Signature(s) Signed: 11/09/2019 5:52:27 PM By: Chad Coria  RN Entered By: Chad [name] on 11/09/2019 15:18:28

## 2019-11-09 NOTE — Progress Notes (Addendum)
Chad [name] ([id number]) Visit Report for 11/09/2019 Chief Complaint Document Details Patient Name: Date of Service: Chad [name], Chad [name] 11/09/2019 3:00 PM Medical Record [id number] Patient Account Number: [id number] Date of Birth/Sex: Treating RN: 06-10-1932 (86 y.o. [name] Primary Care Provider: [name] Other Clinician: Referring Provider: Treating Provider/Extender:Stone III, Boykin Reaper, Mayford Knife in Treatment: 2 Information Obtained from: Patient Electronic Signature(s) Signed: 11/09/2019 3:16:45 PM By: Chad Keeler PA-C Entered By: Chad [name] on 11/09/2019 15:16:45 -------------------------------------------------------------------------------- HPI Details Patient Name: Date of Service: Chad [name], Chad [name] 11/09/2019 3:00 PM Medical Record [id number] Patient Account Number: [id number] Date of Birth/Sex: Treating RN: [birth year] (86 y.o. [name] Primary Care Provider: [name] Other Clinician: Referring Provider: Treating Provider/Extender:Stone III, Boykin Reaper, Mayford Knife in Treatment: 2 History of Present Illness HPI Description: Admission 10/25/2019 This is an 86 year old man reasonably independent man who lives alone. 2 weeks ago he noticed a burning and itching sensation in his right buttock. By the time his daughter became aware of it this apparent abscess had already broken down. He saw his primary doctor and was put on a course of select the cephalexin which she is finished. He has been washing this with soap and water and covering with a nonstick gauze. The patient is ambulatory does not have a prior wound history Past medical history; anemia, metabolic syndrome, hearing loss, hypertension, eczema, osteoarthritis, chronic kidney disease stage III, BPH, chronic pain syndrome. 11/09/2019 upon evaluation today patient actually appears to be doing very well with regard to his wounds on the gluteal region. The only  issue that I see right now is he does have some hyper granular tissue which I think is good to slow down his healing process here. Fortunately there is no signs of active infection however. No fevers, chills, nausea, vomiting, or diarrhea. Electronic Signature(s) Signed: 11/09/2019 4:01:15 PM By: Chad Keeler PA-C Entered By: Chad [name] on 11/09/2019 16:01:14 -------------------------------------------------------------------------------- Chemical Cauterization Details Patient Name: Date of Service: Chad [name], Chad [name] 11/09/2019 3:00 PM Medical Record [id number] Patient Account Number: [id number] Date of Birth/Sex: Treating RN: Nov 23, 1931 (86 y.o. [name] Primary Care Provider: [name] Other Clinician: Referring Provider: Treating Provider/Extender:Stone III, Boykin Reaper, Mayford Knife in Treatment: 2 Procedure Performed for: Wound #1 Right Gluteus Performed By: Physician Chad Keeler, PA Post Procedure Diagnosis Same as Pre-procedure Electronic Signature(s) Signed: 11/09/2019 6:07:56 PM By: Chad Gouty RN, BSN Signed: 11/09/2019 7:02:55 PM By: Chad Keeler PA-C Entered By: Chad [name] on 11/09/2019 15:56:59 -------------------------------------------------------------------------------- Physical Exam Details Patient Name: Date of Service: Chad [name], Chad [name] 11/09/2019 3:00 PM Medical Record [id number] Patient Account Number: [id number] Date of Birth/Sex: Treating RN: May 07, 1932 (86 y.o. [name] Primary Care Provider: [name] Other Clinician: Referring Provider: Treating Provider/Extender:Stone III, Boykin Reaper, Mayford Knife in Treatment: 2 Constitutional Well-nourished and well-hydrated in no acute distress. Respiratory normal breathing without difficulty. Psychiatric this patient is able to make decisions and demonstrates good insight into disease process. Alert and Oriented x 3. pleasant and  cooperative. Notes Patient's wound bed upon inspection showed signs of good granulation at this point. Fortunately there is no evidence of active infection and overall very pleased with how things seem to be progressing. No fevers, chills, nausea, vomiting, or diarrhea. Electronic Signature(s) Signed: 11/09/2019 4:01:33 PM By: Chad Keeler PA-C Entered By: Chad [name] on 11/09/2019 16:01:33 -------------------------------------------------------------------------------- Physician Orders Details Patient Name: Date of Service: [name], Chad 11/09/2019 3:00 PM Medical Record [id number] Patient Account Number: [id number] Date of Birth/Sex: Treating  RN: [birth year] (86 y.o. [name] Primary Care Provider: [name] Other Clinician: Referring Provider: Treating Provider/Extender:Stone III, Boykin Reaper, Mayford Knife in Treatment: 2 Verbal / Phone Orders: No Diagnosis Coding ICD-10 Coding Code Description L02.31 Cutaneous abscess of buttock L98.412 Non-pressure chronic ulcer of buttock with fat layer exposed Follow-up Appointments Return Appointment in 1 week. Dressing Change Frequency Change Dressing every other day. Wound Cleansing May shower and wash wound with soap and water. - on dressing change day Primary Wound Dressing Hydrofera Blue - ready Secondary Dressing Foam Border Electronic Signature(s) Signed: 11/09/2019 6:07:56 PM By: Chad Gouty RN, BSN Signed: 11/09/2019 7:02:55 PM By: Chad Keeler PA-C Entered By: Chad [name] on 11/09/2019 16:00:48 -------------------------------------------------------------------------------- Problem List Details Patient Name: Date of Service: Chad [name], Chad [name] 11/09/2019 3:00 PM Medical Record [id number] Patient Account Number: [id number] Date of Birth/Sex: Treating RN: 07/23/1932 (86 y.o. [name] Primary Care Provider: Other Clinician: [name] Referring Provider: Treating  Provider/Extender:Stone III, Boykin Reaper, Mayford Knife in Treatment: 2 Active Problems ICD-10 Evaluated Encounter Code Description Active Date Today Diagnosis L02.31 Cutaneous abscess of buttock 10/25/2019 No Yes L98.412 Non-pressure chronic ulcer of buttock with fat layer 10/25/2019 No Yes exposed Inactive Problems Resolved Problems Electronic Signature(s) Signed: 11/09/2019 3:16:37 PM By: Chad Keeler PA-C Entered By: Chad [name] on 11/09/2019 15:16:36 -------------------------------------------------------------------------------- Progress Note Details Patient Name: Date of Service: Chad [name], Chad [name] 11/09/2019 3:00 PM Medical Record [id number] Patient Account Number: [id number] Date of Birth/Sex: Treating RN: [birth year] (86 y.o. [name] Primary Care Provider: [name] Other Clinician: Referring Provider: Treating Provider/Extender:Stone III, Boykin Reaper, Mayford Knife in Treatment: 2 Subjective Chief Complaint Information obtained from Patient History of Present Illness (HPI) Admission 10/25/2019 This is an 86 year old man reasonably independent man who lives alone. 2 weeks ago he noticed a burning and itching sensation in his right buttock. By the time his daughter became aware of it this apparent abscess had already broken down. He saw his primary doctor and was put on a course of select the cephalexin which she is finished. He has been washing this with soap and water and covering with a nonstick gauze. The patient is ambulatory does not have a prior wound history Past medical history; anemia, metabolic syndrome, hearing loss, hypertension, eczema, osteoarthritis, chronic kidney disease stage III, BPH, chronic pain syndrome. 11/09/2019 upon evaluation today patient actually appears to be doing very well with regard to his wounds on the gluteal region. The only issue that I see right now is he does have some hyper granular tissue which I think is  good to slow down his healing process here. Fortunately there is no signs of active infection however. No fevers, chills, nausea, vomiting, or diarrhea. Objective Constitutional Well-nourished and well-hydrated in no acute distress. Vitals Time Taken: 3:14 PM, Height: 66 in, Weight: 140 lbs, BMI: 22.6, Temperature: 98.5 F, Pulse: 61 bpm, Respiratory Rate: 18 breaths/min, Blood Pressure: 119/51 mmHg. Respiratory normal breathing without difficulty. Psychiatric this patient is able to make decisions and demonstrates good insight into disease process. Alert and Oriented x 3. pleasant and cooperative. General Notes: Patient's wound bed upon inspection showed signs of good granulation at this point. Fortunately there is no evidence of active infection and overall very pleased with how things seem to be progressing. No fevers, chills, nausea, vomiting, or diarrhea. Integumentary (Hair, Skin) Wound #1 status is Open. Original cause of wound was Bump. The wound is located on the Right Gluteus. The wound measures 1.5cm length x 1.1cm width  x 0.1cm depth; 1.296cm^2 area and 0.13cm^3 volume. There is Fat Layer (Subcutaneous Tissue) Exposed exposed. There is no tunneling or undermining noted. There is a small amount of serosanguineous drainage noted. The wound margin is flat and intact. There is large (67-100%) red granulation within the wound bed. There is a small (1-33%) amount of necrotic tissue within the wound bed including Adherent Slough. Assessment Active Problems ICD-10 Cutaneous abscess of buttock Non-pressure chronic ulcer of buttock with fat layer exposed Procedures Wound #1 Pre-procedure diagnosis of Wound #1 is an Abscess located on the Right Gluteus . An Chemical Cauterization procedure was performed by Chad Keeler, PA. Post procedure Diagnosis Wound #1: Same as Pre-Procedure Plan Follow-up Appointments: Return Appointment in 1 week. Dressing Change Frequency: Change  Dressing every other day. Wound Cleansing: May shower and wash wound with soap and water. - on dressing change day Primary Wound Dressing: Hydrofera Blue - ready Secondary Dressing: Foam Border 1. My suggestion at this point is good to be that we go ahead and continue with the current wound care measures with respect to changing the dressing every other day the longer recommend that we actually use Hydrofera Blue due to the hyper granular tissue I did have to perform chemical cauterization with silver nitrate and I believe that the [facility] keep this under better control just 1 stick of silver nitrate was utilized. 2. I am also can recommend at this point that the patient continue with appropriate offloading to ensure nothing worsens in the meantime. 3. I am also going to recommend at this point that the patient continue with monitoring for any signs of infection though I see nothing going on at this point nonetheless we will keep a close eye on this as well. We will see patient back for reevaluation in 1 week here in the clinic. If anything worsens or changes patient will contact our office for additional recommendations. Electronic Signature(s) Signed: 11/09/2019 4:03:28 PM By: Chad Keeler PA-C Entered By: Chad [name] on 11/09/2019 16:03:28 -------------------------------------------------------------------------------- SuperBill Details Patient Name: Date of Service: Chad [name], Chad [name] 11/09/2019 Medical Record [id number] Patient Account Number: [id number] Date of Birth/Sex: Treating RN: Nov 22, 1931 (86 y.o. [name] Primary Care Provider: [name] Other Clinician: Referring Provider: Treating Provider/Extender:Stone III, Boykin Reaper, Mayford Knife in Treatment: 2 Diagnosis Coding ICD-10 Codes Code Description L02.31 Cutaneous abscess of buttock L98.412 Non-pressure chronic ulcer of buttock with fat layer exposed Facility Procedures CPT4 Code:  [id number] Description: 82505 - CHEM CAUT GRANULATION TISS ICD-10 Diagnosis Description L98.412 Non-pressure chronic ulcer of buttock with fat layer Modifier: exposed Quantity: 1 Physician Procedures CPT4 Code Description: [id number] - WC PHYS CHEM CAUT GRAN TISSUE ICD-10 Diagnosis Description L98.412 Non-pressure chronic ulcer of buttock with fat layer e Modifier: xposed Quantity: 1 Electronic Signature(s) Signed: 11/09/2019 4:03:36 PM By: Chad Keeler PA-C Entered By: Chad [name] on 11/09/2019 16:03:35

## 2019-11-16 ENCOUNTER — Encounter (HOSPITAL_BASED_OUTPATIENT_CLINIC_OR_DEPARTMENT_OTHER): Payer: Medicare HMO | Admitting: Physician Assistant

## 2019-11-25 DIAGNOSIS — Z1152 Encounter for screening for COVID-19: Secondary | ICD-10-CM | POA: Diagnosis not present

## 2019-11-25 DIAGNOSIS — Z20822 Contact with and (suspected) exposure to covid-19: Secondary | ICD-10-CM | POA: Diagnosis not present

## 2019-12-05 DIAGNOSIS — Z8616 Personal history of COVID-19: Secondary | ICD-10-CM | POA: Diagnosis not present

## 2019-12-05 DIAGNOSIS — Z20822 Contact with and (suspected) exposure to covid-19: Secondary | ICD-10-CM | POA: Diagnosis not present

## 2019-12-05 DIAGNOSIS — Z09 Encounter for follow-up examination after completed treatment for conditions other than malignant neoplasm: Secondary | ICD-10-CM | POA: Diagnosis not present

## 2019-12-17 DIAGNOSIS — Z8616 Personal history of COVID-19: Secondary | ICD-10-CM | POA: Diagnosis not present

## 2019-12-17 DIAGNOSIS — U071 COVID-19: Secondary | ICD-10-CM | POA: Diagnosis not present

## 2019-12-17 DIAGNOSIS — Z09 Encounter for follow-up examination after completed treatment for conditions other than malignant neoplasm: Secondary | ICD-10-CM | POA: Diagnosis not present

## 2019-12-22 DIAGNOSIS — H903 Sensorineural hearing loss, bilateral: Secondary | ICD-10-CM | POA: Diagnosis not present

## 2019-12-28 DIAGNOSIS — H903 Sensorineural hearing loss, bilateral: Secondary | ICD-10-CM | POA: Diagnosis not present

## 2020-03-02 DIAGNOSIS — M1990 Unspecified osteoarthritis, unspecified site: Secondary | ICD-10-CM | POA: Diagnosis not present

## 2020-03-02 DIAGNOSIS — N183 Chronic kidney disease, stage 3 unspecified: Secondary | ICD-10-CM | POA: Diagnosis not present

## 2020-03-02 DIAGNOSIS — R011 Cardiac murmur, unspecified: Secondary | ICD-10-CM | POA: Diagnosis not present

## 2020-03-02 DIAGNOSIS — N401 Enlarged prostate with lower urinary tract symptoms: Secondary | ICD-10-CM | POA: Diagnosis not present

## 2020-03-06 ENCOUNTER — Other Ambulatory Visit: Payer: Self-pay | Admitting: Family Medicine

## 2020-03-06 DIAGNOSIS — N1832 Chronic kidney disease, stage 3b: Secondary | ICD-10-CM

## 2020-03-16 ENCOUNTER — Other Ambulatory Visit (HOSPITAL_COMMUNITY): Payer: Self-pay | Admitting: Family Medicine

## 2020-03-16 DIAGNOSIS — R011 Cardiac murmur, unspecified: Secondary | ICD-10-CM

## 2020-03-29 ENCOUNTER — Encounter (HOSPITAL_COMMUNITY): Payer: Self-pay | Admitting: Radiology

## 2020-04-06 ENCOUNTER — Telehealth (HOSPITAL_COMMUNITY): Payer: Self-pay | Admitting: Family Medicine

## 2020-04-06 NOTE — Telephone Encounter (Signed)
Just an FYI. We have made several attempts to contact this patient including sending a letter to schedule or reschedule their echocardiogram. We will be removing the patient from the echo [place].     03/29/20 MAILED LETTER EVD 03/27/20 [facility] to schedule @ 8:26/LBW 03/22/20 [facility] to schedule @ 9:41/LBW  03/19/2020 [facility] to schedule @ 2:45/LBW  03/16/20 [facility] to schedule @ 10:16/LBW PA# [id number] 03/16/2020 thru 04/15/2020    Thank you

## 2020-04-17 ENCOUNTER — Ambulatory Visit
Admission: RE | Admit: 2020-04-17 | Discharge: 2020-04-17 | Disposition: A | Discharge status: Home / Self Care | Payer: Medicare HMO | Source: Ambulatory Visit | Attending: Family Medicine | Admitting: Family Medicine

## 2020-04-17 DIAGNOSIS — N183 Chronic kidney disease, stage 3 unspecified: Secondary | ICD-10-CM | POA: Diagnosis not present

## 2020-04-17 DIAGNOSIS — N1832 Chronic kidney disease, stage 3b: Secondary | ICD-10-CM

## 2020-04-17 DIAGNOSIS — N400 Enlarged prostate without lower urinary tract symptoms: Secondary | ICD-10-CM | POA: Diagnosis not present

## 2020-04-17 DIAGNOSIS — N261 Atrophy of kidney (terminal): Secondary | ICD-10-CM | POA: Diagnosis not present

## 2020-04-17 DIAGNOSIS — N289 Disorder of kidney and ureter, unspecified: Secondary | ICD-10-CM | POA: Diagnosis not present

## 2020-04-19 DIAGNOSIS — H5210 Myopia, unspecified eye: Secondary | ICD-10-CM | POA: Diagnosis not present

## 2020-04-25 ENCOUNTER — Other Ambulatory Visit: Payer: Self-pay

## 2020-04-25 ENCOUNTER — Ambulatory Visit (HOSPITAL_COMMUNITY): Payer: Medicare HMO | Attending: Cardiology

## 2020-04-25 DIAGNOSIS — R011 Cardiac murmur, unspecified: Secondary | ICD-10-CM | POA: Insufficient documentation

## 2020-04-25 LAB — ECHOCARDIOGRAM COMPLETE
Area-P 1/2: 4.1 cm2
MV M vel: 5.28 m/s
MV Peak grad: 111.5 mmHg
P 1/2 time: 767 msec
S' Lateral: 3.4 cm

## 2020-06-14 DIAGNOSIS — M25562 Pain in left knee: Secondary | ICD-10-CM | POA: Diagnosis not present

## 2020-06-21 DIAGNOSIS — Z96651 Presence of right artificial knee joint: Secondary | ICD-10-CM | POA: Diagnosis not present

## 2020-06-21 DIAGNOSIS — M25562 Pain in left knee: Secondary | ICD-10-CM | POA: Diagnosis not present

## 2020-06-21 DIAGNOSIS — M1712 Unilateral primary osteoarthritis, left knee: Secondary | ICD-10-CM | POA: Diagnosis not present

## 2020-08-04 HISTORY — PX: EYE SURGERY: SHX253

## 2020-08-08 DIAGNOSIS — H2511 Age-related nuclear cataract, right eye: Secondary | ICD-10-CM | POA: Diagnosis not present

## 2020-08-13 DIAGNOSIS — H2511 Age-related nuclear cataract, right eye: Secondary | ICD-10-CM | POA: Diagnosis not present

## 2020-08-24 DIAGNOSIS — H2512 Age-related nuclear cataract, left eye: Secondary | ICD-10-CM | POA: Diagnosis not present

## 2020-08-31 DIAGNOSIS — H2512 Age-related nuclear cataract, left eye: Secondary | ICD-10-CM | POA: Diagnosis not present

## 2020-09-21 DIAGNOSIS — Z01818 Encounter for other preprocedural examination: Secondary | ICD-10-CM | POA: Diagnosis not present

## 2020-09-21 DIAGNOSIS — Z79899 Other long term (current) drug therapy: Secondary | ICD-10-CM | POA: Diagnosis not present

## 2020-09-21 DIAGNOSIS — N1832 Chronic kidney disease, stage 3b: Secondary | ICD-10-CM | POA: Diagnosis not present

## 2020-09-27 NOTE — Patient Instructions (Signed)
DUE TO COVID-19 ONLY ONE VISITOR IS ALLOWED TO COME WITH YOU AND STAY IN THE WAITING ROOM ONLY DURING PRE OP AND PROCEDURE DAY OF SURGERY. THE 1 VISITOR  MAY VISIT WITH YOU AFTER SURGERY IN YOUR PRIVATE ROOM DURING VISITING HOURS ONLY!  YOU NEED TO HAVE A COVID 19 TEST ON_3/4______ '@_______'$ , THIS TEST MUST BE DONE BEFORE SURGERY,  COVID TESTING SITE [address] [place] [address], IT IS ON THE RIGHT GOING OUT [address] APPROXIMATELY  2 MINUTES PAST [employer] ON THE RIGHT. ONCE YOUR COVID TEST IS COMPLETED,  PLEASE BEGIN THE QUARANTINE INSTRUCTIONS AS OUTLINED IN YOUR HANDOUT.                Chad [name]    Your procedure is scheduled on: 10/09/20   Report to [facility] Main  Entrance   Report to Short stay at 5:30 AM     Call this number if you have problems the morning of surgery [name], NO CHEWING GUM [place].   No food after midnight.    You may have clear liquid until 4:30 AM.    At 4:00 AM drink pre surgery drink.   Nothing by mouth after 4:30 AM.    Take these medicines the morning of surgery with A SIP OF WATER: Atenolol, Amlodipine, Tamsulosin                                 You may not have any metal on your body including               piercings  Do not wear jewelry,  lotions, powders or deodorant                       Men may shave face and neck.   Do not bring valuables to the hospital. [place].  Contacts, dentures or bridgework may not be worn into surgery.                 Please read over the following fact sheets you were given: _____________________________________________________________________             [facility] - Preparing for Surgery Before surgery, you can play an important role.  Because skin is not sterile, your skin needs to be as free of germs as possible.  You can reduce  the number of germs on your skin by washing with CHG (chlorahexidine gluconate) soap before surgery.  CHG is an antiseptic cleaner which kills germs and bonds with the skin to continue killing germs even after washing. Please DO NOT use if you have an allergy to CHG or antibacterial soaps.  If your skin becomes reddened/irritated stop using the CHG and inform your nurse when you arrive at Short Stay.   You may shave your face/neck. Please follow these instructions carefully:  1.  Shower with CHG Soap the night before surgery and the  morning of Surgery.  2.  If you choose to wash your hair, wash your hair first as usual with your  normal  shampoo.  3.  After you shampoo, rinse your hair and body thoroughly to remove the  shampoo.  4.  Use CHG as you would any other liquid soap.  You can apply chg directly  to the skin and wash                       Gently with a scrungie or clean washcloth.  5.  Apply the CHG Soap to your body ONLY FROM THE NECK DOWN.   Do not use on face/ open                           Wound or open sores. Avoid contact with eyes, ears mouth and genitals (private parts).                       Wash face,  Genitals (private parts) with your normal soap.             6.  Wash thoroughly, paying special attention to the area where your surgery  will be performed.  7.  Thoroughly rinse your body with warm water from the neck down.  8.  DO NOT shower/wash with your normal soap after using and rinsing off  the CHG Soap.             9.  Pat yourself dry with a clean towel.            10.  Wear clean pajamas.            11.  Place clean sheets on your bed the night of your first shower and do not  sleep with pets. Day of Surgery : Do not apply any lotions/deodorants the morning of surgery.  Please wear clean clothes to the hospital/surgery center.  FAILURE TO FOLLOW THESE INSTRUCTIONS MAY RESULT IN THE CANCELLATION OF YOUR SURGERY PATIENT  SIGNATURE_________________________________  NURSE SIGNATURE__________________________________  ________________________________________________________________________   Chad [name]  An incentive spirometer is a tool that can help keep your lungs clear and active. This tool measures how well you are filling your lungs with each breath. Taking long deep breaths may help reverse or decrease the chance of developing breathing (pulmonary) problems (especially infection) following:  A long period of time when you are unable to move or be active. BEFORE THE PROCEDURE   If the spirometer includes an indicator to show your best effort, your nurse or respiratory therapist will set it to a desired goal.  If possible, sit up straight or lean slightly forward. Try not to slouch.  Hold the incentive spirometer in an upright position. INSTRUCTIONS FOR USE  1. Sit on the edge of your bed if possible, or sit up as far as you can in bed or on a chair. 2. Hold the incentive spirometer in an upright position. 3. Breathe out normally. 4. Place the mouthpiece in your mouth and seal your lips tightly around it. 5. Breathe in slowly and as deeply as possible, raising the piston or the ball toward the top of the column. 6. Hold your breath for 3-5 seconds or for as long as possible. Allow the piston or ball to fall to the bottom of the column. 7. Remove the mouthpiece from your mouth and breathe out normally. 8. Rest for a few seconds and repeat Steps 1 through 7 at least 10 times every 1-2 hours when you are awake. Take your time and take a few normal breaths between deep breaths. 9. The spirometer may include an indicator to show your best effort.  Use the indicator as a goal to work toward during each repetition. 10. After each set of 10 deep breaths, practice coughing to be sure your lungs are clear. If you have an incision (the cut made at the time of surgery), support your incision when coughing  by placing a pillow or rolled up towels firmly against it. Once you are able to get out of bed, walk around indoors and cough well. You may stop using the incentive spirometer when instructed by your caregiver.  RISKS AND COMPLICATIONS  Take your time so you do not get dizzy or light-headed.  If you are in pain, you may need to take or ask for pain medication before doing incentive spirometry. It is harder to take a deep breath if you are having pain. AFTER USE  Rest and breathe slowly and easily.  It can be helpful to keep track of a log of your progress. Your caregiver can provide you with a simple table to help with this. If you are using the spirometer at home, follow these instructions: [place] IF:   You are having difficultly using the spirometer.  You have trouble using the spirometer as often as instructed.  Your pain medication is not giving enough relief while using the spirometer.  You develop fever of 100.5 F (38.1 C) or higher. SEEK IMMEDIATE MEDICAL CARE IF:   You cough up bloody sputum that had not been present before.  You develop fever of 102 F (38.9 C) or greater.  You develop worsening pain at or near the incision site. MAKE SURE YOU:   Understand these instructions.  Will watch your condition.  Will get help right away if you are not doing well or get worse. Document Released: 12/01/2006 Document Revised: 10/13/2011 Document Reviewed: 02/01/2007 [facility] Patient Information 2014 [place], [place].   ________________________________________________________________________

## 2020-09-28 ENCOUNTER — Encounter (HOSPITAL_COMMUNITY)
Admission: RE | Admit: 2020-09-28 | Discharge: 2020-09-28 | Disposition: A | Discharge status: Home / Self Care | Payer: Medicare Other | Source: Ambulatory Visit | Attending: Orthopedic Surgery | Admitting: Orthopedic Surgery

## 2020-09-28 ENCOUNTER — Other Ambulatory Visit: Payer: Self-pay

## 2020-09-28 ENCOUNTER — Encounter (HOSPITAL_COMMUNITY): Payer: Self-pay

## 2020-09-28 DIAGNOSIS — I10 Essential (primary) hypertension: Secondary | ICD-10-CM | POA: Insufficient documentation

## 2020-09-28 DIAGNOSIS — Z01818 Encounter for other preprocedural examination: Secondary | ICD-10-CM | POA: Insufficient documentation

## 2020-09-28 LAB — CBC
HCT: 31.3 % — ABNORMAL LOW (ref 39.0–52.0)
Hemoglobin: 10.1 g/dL — ABNORMAL LOW (ref 13.0–17.0)
MCH: 27.2 pg (ref 26.0–34.0)
MCHC: 32.3 g/dL (ref 30.0–36.0)
MCV: 84.1 fL (ref 80.0–100.0)
Platelets: 225 10*3/uL (ref 150–400)
RBC: 3.72 MIL/uL — ABNORMAL LOW (ref 4.22–5.81)
RDW: 16.4 % — ABNORMAL HIGH (ref 11.5–15.5)
WBC: 7 10*3/uL (ref 4.0–10.5)
nRBC: 0 % (ref 0.0–0.2)

## 2020-09-28 LAB — SURGICAL PCR SCREEN
MRSA, PCR: NEGATIVE
Staphylococcus aureus: NEGATIVE

## 2020-09-28 LAB — BASIC METABOLIC PANEL
Anion gap: 10 (ref 5–15)
BUN: 37 mg/dL — ABNORMAL HIGH (ref 8–23)
CO2: 25 mmol/L (ref 22–32)
Calcium: 9.4 mg/dL (ref 8.9–10.3)
Chloride: 108 mmol/L (ref 98–111)
Creatinine, Ser: 1.52 mg/dL — ABNORMAL HIGH (ref 0.61–1.24)
GFR, Estimated: 44 mL/min — ABNORMAL LOW (ref >60)
Glucose, Bld: 105 mg/dL — ABNORMAL HIGH (ref 70–99)
Potassium: 4.7 mmol/L (ref 3.5–5.1)
Sodium: 143 mmol/L (ref 135–145)

## 2020-09-28 NOTE — Progress Notes (Signed)
COVID Vaccine Completed:Yes Date COVID Vaccine completed:03/05/20 COVID vaccine manufacturer: Pfizer      PCP - Dr. Solon Palm Cardiologist - none  Chest x-ray - no EKG - 09/28/20-chart, Epic Stress Test - no ECHO - 09/26/19-epic Cardiac Cath - no Pacemaker/ICD device last checked:NA  Sleep Study - no CPAP -   Fasting Blood Sugar - no Checks Blood Sugar _____ times a day  Blood Thinner Instructions:NA Aspirin Instructions: Last Dose:  Anesthesia review:   Patient denies shortness of breath, fever, cough and chest pain at PAT appointment yes  Patient verbalized understanding of instructions that were given to them at the PAT appointment. Patient was also instructed that they will need to review over the PAT instructions again at home before surgery.yes Pt is in good shape for 88. He has been using a walker because of his knee but has no SOB doing house work or with ADLs

## 2020-09-29 LAB — TYPE AND SCREEN
ABO/RH(D): A POS
Antibody Screen: NEGATIVE

## 2020-10-05 ENCOUNTER — Other Ambulatory Visit (HOSPITAL_COMMUNITY): Payer: Medicare Other

## 2020-10-08 ENCOUNTER — Other Ambulatory Visit (HOSPITAL_COMMUNITY)
Admission: RE | Admit: 2020-10-08 | Discharge: 2020-10-08 | Disposition: A | Discharge status: Home / Self Care | Payer: Medicare Other | Source: Ambulatory Visit | Attending: Orthopedic Surgery | Admitting: Orthopedic Surgery

## 2020-10-08 DIAGNOSIS — Z20822 Contact with and (suspected) exposure to covid-19: Secondary | ICD-10-CM | POA: Insufficient documentation

## 2020-10-08 DIAGNOSIS — Z01812 Encounter for preprocedural laboratory examination: Secondary | ICD-10-CM | POA: Insufficient documentation

## 2020-10-08 LAB — SARS CORONAVIRUS 2 (TAT 6-24 HRS): SARS Coronavirus 2: NEGATIVE

## 2020-10-08 NOTE — H&P (Signed)
TOTAL KNEE ADMISSION H&P  Patient is being admitted for left total knee arthroplasty.  Subjective:  Chief Complaint:left knee pain.  HPI: Chad [name], 87 y.o. male, has a history of pain and functional disability in the left knee due to arthritis and has failed non-surgical conservative treatments for greater than 12 weeks to include NSAID's and/or analgesics, use of assistive devices and activity modification.  Onset of symptoms was gradual, starting 7+ years ago with gradually worsening course since that time. The patient noted prior procedures on the knee to include  arthroplasty on the right knee 6-7 yrs ago.  Patient currently rates pain in the left knee(s) at 9 out of 10 with activity. Patient has night pain, worsening of pain with activity and weight bearing, pain that interferes with activities of daily living, pain with passive range of motion, crepitus and joint swelling.  Patient has evidence of periarticular osteophytes and joint space narrowing by imaging studies.  There is no active infection.  Risks, benefits and expectations were discussed with the patient.  Risks including but not limited to the risk of anesthesia, blood clots, nerve damage, blood vessel damage, failure of the prosthesis, infection and up to and including death.  Patient understand the risks, benefits and expectations and wishes to proceed with surgery.   D/C Plans:       Home  Post-op Meds:       No Rx given   Tranexamic Acid:      To be given - IV   Decadron:      Is to be given  FYI:      ASA  Norco  DME:   Pt equipment arranged  PT:   OPPT @ [facility]  Pharmacy: [facility] -  [phone], [place], [place]  Patient Active Problem List   Diagnosis Date Noted  . Acute blood loss anemia 03/31/2013  . Osteoarthritis of right knee 03/30/2013   Past Medical History:  Diagnosis Date  . Arthritis   . Heart murmur    asa child   . Hypertension     Past Surgical History:  Procedure Laterality Date   . EYE SURGERY Bilateral 2022  . TONSILLECTOMY    . TOTAL KNEE ARTHROPLASTY Right 03/30/2013   Procedure: RIGHT TOTAL KNEE ARTHROPLASTY;  Surgeon: Tobi Bastos, MD;  Location: [facility];  Service: Orthopedics;  Laterality: Right;    No current facility-administered medications for this encounter.   Current Outpatient Medications  Medication Sig Dispense Refill Last Dose  . amLODipine (NORVASC) 10 MG tablet Take 10 mg by mouth every morning.     Marland Kitchen atenolol (TENORMIN) 100 MG tablet Take 100 mg by mouth every morning.     Marland Kitchen HYDROcodone-acetaminophen (NORCO) 7.5-325 MG tablet Take 1 tablet by mouth every 6 (six) hours as needed for moderate pain.     Marland Kitchen lovastatin (MEVACOR) 40 MG tablet Take 40 mg by mouth at bedtime.     . tamsulosin (FLOMAX) 0.4 MG CAPS capsule Take 0.4 mg by mouth daily.      No Known Allergies   Social History   Tobacco Use  . Smoking status: Never Smoker  . Smokeless tobacco: Never Used  Substance Use Topics  . Alcohol use: No       Review of Systems  Constitutional: Negative.   HENT: Negative.   Eyes: Negative.   Respiratory: Negative.   Cardiovascular: Negative.   Gastrointestinal: Negative.   Genitourinary: Negative.   Musculoskeletal: Positive for joint pain.  Skin:  Negative.   Neurological: Negative.   Endo/Heme/Allergies: Negative.   Psychiatric/Behavioral: Negative.      Objective:  Physical Exam Constitutional:      Appearance: He is well-developed.  HENT:     Head: Normocephalic.  Eyes:     Pupils: Pupils are equal, round, and reactive to light.  Neck:     Thyroid: No thyromegaly.     Vascular: No JVD.     Trachea: No tracheal deviation.  Cardiovascular:     Rate and Rhythm: Normal rate and regular rhythm.     Pulses: Intact distal pulses.  Pulmonary:     Effort: Pulmonary effort is normal. No respiratory distress.     Breath sounds: Normal breath sounds. No wheezing.  Abdominal:     Palpations: Abdomen is soft.      Tenderness: There is no abdominal tenderness. There is no guarding.  Musculoskeletal:     Cervical back: Neck supple.     Right knee: Swelling and bony tenderness present. No erythema or ecchymosis. Decreased range of motion. Tenderness present.  Lymphadenopathy:     Cervical: No cervical adenopathy.  Skin:    General: Skin is warm and dry.  Neurological:     Mental Status: He is alert and oriented to person, place, and time.  Psychiatric:        Mood and Affect: Mood and affect normal.       Labs:  Estimated body mass index is 20.98 kg/m as calculated from the following:   Height as of 09/28/20: '5\' 6"'$  (1.676 m).   Weight as of 09/28/20: 59 kg.   Imaging Review Plain radiographs demonstrate severe degenerative joint disease of the left knee(s). The bone quality appears to be good for age and reported activity level.      Assessment/Plan:  End stage arthritis, left knee   The patient history, physical examination, clinical judgment of the provider and imaging studies are consistent with end stage degenerative joint disease of the left knee(s) and total knee arthroplasty is deemed medically necessary. The treatment options including medical management, injection therapy arthroscopy and arthroplasty were discussed at length. The risks and benefits of total knee arthroplasty were presented and reviewed. The risks due to aseptic loosening, infection, stiffness, patella tracking problems, thromboembolic complications and other imponderables were discussed. The patient acknowledged the explanation, agreed to proceed with the plan and consent was signed. Patient is being admitted for treatment for surgery, pain control, PT, OT, prophylactic antibiotics, VTE prophylaxis, progressive ambulation and ADL's and discharge planning. The patient is planning to be discharged home.     Patient's anticipated LOS is less than 2 midnights, meeting these requirements: - Lives within 1 hour of  care - Has a competent adult at home to recover with post-op recover - NO history of  - Chronic pain requiring opiods  - Diabetes  - Coronary Artery Disease  - Heart failure  - Heart attack  - Stroke  - DVT/VTE  - Cardiac arrhythmia  - Respiratory Failure/COPD  - Renal failure  - Anemia  - Advanced Liver disease

## 2020-10-08 NOTE — Anesthesia Preprocedure Evaluation (Addendum)
Anesthesia Evaluation  Patient identified by MRN, date of birth, ID band Patient awake    Reviewed: Allergy & Precautions, H&P , NPO status , Patient's Chart, lab work & pertinent test results  Airway Mallampati: I  TM Distance: >3 FB Neck ROM: Full    Dental no notable dental hx. (+) Edentulous Upper, Edentulous Lower   Pulmonary neg pulmonary ROS,    Pulmonary exam normal breath sounds clear to auscultation       Cardiovascular Exercise Tolerance: Good hypertension, Pt. on medications and Pt. on home beta blockers negative cardio ROS Normal cardiovascular exam Rhythm:Regular Rate:Normal  ECHO 9/22 Left Ventricle: Left ventricular ejection fraction, by estimation, is 55  to 60%. The left ventricle has normal function. The left ventricle has no  regional wall motion abnormalities. The left ventricular internal cavity  size was normal in size. There is  no left ventricular hypertrophy. Left ventricular diastolic parameters  are consistent with Grade I diastolic dysfunction    Neuro/Psych negative neurological ROS  negative psych ROS   GI/Hepatic negative GI ROS, Neg liver ROS,   Endo/Other  negative endocrine ROS  Renal/GU negative Renal ROS  negative genitourinary   Musculoskeletal negative musculoskeletal ROS (+) Arthritis , Osteoarthritis,    Abdominal   Peds negative pediatric ROS (+)  Hematology negative hematology ROS (+) Blood dyscrasia, anemia ,   Anesthesia Other Findings   Reproductive/Obstetrics negative OB ROS                            Anesthesia Physical Anesthesia Plan  ASA: III  Anesthesia Plan: Spinal and MAC   Post-op Pain Management:  Regional for Post-op pain   Induction:   PONV Risk Score and Plan: 2 and Propofol infusion  Airway Management Planned: Nasal Cannula and Natural Airway  Additional Equipment: None  Intra-op Plan:   Post-operative Plan:    Informed Consent: I have reviewed the patients History and Physical, chart, labs and discussed the procedure including the risks, benefits and alternatives for the proposed anesthesia with the patient or authorized representative who has indicated his/her understanding and acceptance.       Plan Discussed with: Anesthesiologist  Anesthesia Plan Comments: (  )       Anesthesia Quick Evaluation

## 2020-10-09 ENCOUNTER — Ambulatory Visit (HOSPITAL_COMMUNITY): Payer: Medicare Other | Admitting: Anesthesiology

## 2020-10-09 ENCOUNTER — Encounter (HOSPITAL_COMMUNITY): Payer: Self-pay | Admitting: Orthopedic Surgery

## 2020-10-09 ENCOUNTER — Observation Stay (HOSPITAL_COMMUNITY)
Admission: RE | Admit: 2020-10-09 | Discharge: 2020-10-10 | Disposition: A | Discharge status: Home / Self Care | Payer: Medicare Other | Source: Ambulatory Visit | Attending: Orthopedic Surgery | Admitting: Orthopedic Surgery

## 2020-10-09 ENCOUNTER — Encounter (HOSPITAL_COMMUNITY)
Admission: RE | Disposition: A | Discharge status: Home / Self Care | Payer: Self-pay | Source: Ambulatory Visit | Attending: Orthopedic Surgery

## 2020-10-09 DIAGNOSIS — Z79899 Other long term (current) drug therapy: Secondary | ICD-10-CM | POA: Insufficient documentation

## 2020-10-09 DIAGNOSIS — M1712 Unilateral primary osteoarthritis, left knee: Principal | ICD-10-CM | POA: Insufficient documentation

## 2020-10-09 DIAGNOSIS — I10 Essential (primary) hypertension: Secondary | ICD-10-CM | POA: Diagnosis not present

## 2020-10-09 DIAGNOSIS — G8918 Other acute postprocedural pain: Secondary | ICD-10-CM | POA: Diagnosis not present

## 2020-10-09 DIAGNOSIS — Z96651 Presence of right artificial knee joint: Secondary | ICD-10-CM | POA: Insufficient documentation

## 2020-10-09 DIAGNOSIS — Z96652 Presence of left artificial knee joint: Secondary | ICD-10-CM

## 2020-10-09 DIAGNOSIS — D62 Acute posthemorrhagic anemia: Secondary | ICD-10-CM | POA: Diagnosis not present

## 2020-10-09 HISTORY — PX: TOTAL KNEE ARTHROPLASTY: SHX125

## 2020-10-09 SURGERY — ARTHROPLASTY, KNEE, TOTAL
Anesthesia: Monitor Anesthesia Care | Site: Knee | Laterality: Left

## 2020-10-09 MED ORDER — PHENOL 1.4 % MT LIQD: 1.0000 | OROMUCOSAL | Status: DC | PRN

## 2020-10-09 MED ORDER — AMLODIPINE BESYLATE 10 MG PO TABS
10.0000 mg | ORAL_TABLET | Freq: Every morning | ORAL | Status: DC
  Administered 2020-10-10: 10 mg via ORAL
  Filled 2020-10-09: qty 1

## 2020-10-09 MED ORDER — METHOCARBAMOL 500 MG IVPB - SIMPLE MED
500.0000 mg | Freq: Four times a day (QID) | INTRAVENOUS | Status: DC | PRN
  Filled 2020-10-09: qty 50

## 2020-10-09 MED ORDER — PROPOFOL 500 MG/50ML IV EMUL
INTRAVENOUS | Status: AC
  Filled 2020-10-09: qty 50

## 2020-10-09 MED ORDER — BUPIVACAINE-EPINEPHRINE (PF) 0.25% -1:200000 IJ SOLN
INTRAMUSCULAR | Status: DC | PRN
  Administered 2020-10-09: 30 mL

## 2020-10-09 MED ORDER — MAGNESIUM CITRATE PO SOLN: 1.0000 | Freq: Once | ORAL | Status: DC | PRN

## 2020-10-09 MED ORDER — BISACODYL 10 MG RE SUPP: 10.0000 mg | Freq: Every day | RECTAL | Status: DC | PRN

## 2020-10-09 MED ORDER — TAMSULOSIN HCL 0.4 MG PO CAPS
0.4000 mg | ORAL_CAPSULE | Freq: Every day | ORAL | Status: DC
  Administered 2020-10-10: 0.4 mg via ORAL
  Filled 2020-10-09: qty 1

## 2020-10-09 MED ORDER — OXYCODONE HCL 5 MG PO TABS: 5.0000 mg | ORAL_TABLET | Freq: Once | ORAL | Status: DC | PRN

## 2020-10-09 MED ORDER — LIDOCAINE 2% (20 MG/ML) 5 ML SYRINGE
INTRAMUSCULAR | Status: AC
  Filled 2020-10-09: qty 5

## 2020-10-09 MED ORDER — FENTANYL CITRATE (PF) 100 MCG/2ML IJ SOLN: 25.0000 ug | INTRAMUSCULAR | Status: DC | PRN

## 2020-10-09 MED ORDER — DEXAMETHASONE SODIUM PHOSPHATE 10 MG/ML IJ SOLN
INTRAMUSCULAR | Status: DC | PRN
  Administered 2020-10-09: 10 mg
  Administered 2020-10-09: 8 mg

## 2020-10-09 MED ORDER — ACETAMINOPHEN 325 MG PO TABS
325.0000 mg | ORAL_TABLET | ORAL | Status: DC | PRN
Start: 2020-10-09 — End: 2020-10-09

## 2020-10-09 MED ORDER — BUPIVACAINE IN DEXTROSE 0.75-8.25 % IT SOLN
INTRATHECAL | Status: DC | PRN
  Administered 2020-10-09: 1.8 mL via INTRATHECAL

## 2020-10-09 MED ORDER — DEXAMETHASONE SODIUM PHOSPHATE 10 MG/ML IJ SOLN
INTRAMUSCULAR | Status: AC
  Filled 2020-10-09: qty 1

## 2020-10-09 MED ORDER — OXYCODONE HCL 5 MG/5ML PO SOLN: 5.0000 mg | Freq: Once | ORAL | Status: DC | PRN

## 2020-10-09 MED ORDER — ASPIRIN 81 MG PO CHEW
81.0000 mg | CHEWABLE_TABLET | Freq: Two times a day (BID) | ORAL | Status: DC
  Administered 2020-10-09 – 2020-10-10 (×2): 81 mg via ORAL
  Filled 2020-10-09 (×2): qty 1

## 2020-10-09 MED ORDER — SODIUM CHLORIDE 0.9 % IR SOLN
Status: DC | PRN
  Administered 2020-10-09: 1000 mL

## 2020-10-09 MED ORDER — LIDOCAINE HCL (CARDIAC) PF 100 MG/5ML IV SOSY
PREFILLED_SYRINGE | INTRAVENOUS | Status: DC | PRN
  Administered 2020-10-09: 30 mg via INTRAVENOUS

## 2020-10-09 MED ORDER — CEFAZOLIN SODIUM-DEXTROSE 2-4 GM/100ML-% IV SOLN
2.0000 g | INTRAVENOUS | Status: AC
  Administered 2020-10-09: 2 g via INTRAVENOUS
  Filled 2020-10-09: qty 100

## 2020-10-09 MED ORDER — ONDANSETRON HCL 4 MG/2ML IJ SOLN
INTRAMUSCULAR | Status: DC | PRN
  Administered 2020-10-09: 4 mg via INTRAVENOUS

## 2020-10-09 MED ORDER — DEXAMETHASONE SODIUM PHOSPHATE 10 MG/ML IJ SOLN: 10.0000 mg | Freq: Once | INTRAMUSCULAR | Status: DC

## 2020-10-09 MED ORDER — MIDAZOLAM HCL 2 MG/2ML IJ SOLN
INTRAMUSCULAR | Status: AC
  Filled 2020-10-09: qty 2

## 2020-10-09 MED ORDER — DEXAMETHASONE SODIUM PHOSPHATE 10 MG/ML IJ SOLN
10.0000 mg | Freq: Once | INTRAMUSCULAR | Status: AC
  Administered 2020-10-10: 10 mg via INTRAVENOUS
  Filled 2020-10-09: qty 1

## 2020-10-09 MED ORDER — KETOROLAC TROMETHAMINE 30 MG/ML IJ SOLN
INTRAMUSCULAR | Status: DC | PRN
  Administered 2020-10-09: 30 mg via INTRA_ARTICULAR

## 2020-10-09 MED ORDER — TRANEXAMIC ACID-NACL 1000-0.7 MG/100ML-% IV SOLN
1000.0000 mg | INTRAVENOUS | Status: AC
  Administered 2020-10-09: 1000 mg via INTRAVENOUS
  Filled 2020-10-09: qty 100

## 2020-10-09 MED ORDER — FENTANYL CITRATE (PF) 100 MCG/2ML IJ SOLN
INTRAMUSCULAR | Status: DC | PRN
  Administered 2020-10-09: 50 ug via INTRAVENOUS
  Administered 2020-10-09: 25 ug via INTRAVENOUS

## 2020-10-09 MED ORDER — MENTHOL 3 MG MT LOZG: 1.0000 | LOZENGE | OROMUCOSAL | Status: DC | PRN

## 2020-10-09 MED ORDER — METHOCARBAMOL 500 MG PO TABS: 500.0000 mg | ORAL_TABLET | Freq: Four times a day (QID) | ORAL | Status: DC | PRN

## 2020-10-09 MED ORDER — ALUM & MAG HYDROXIDE-SIMETH 200-200-20 MG/5ML PO SUSP: 15.0000 mL | ORAL | Status: DC | PRN

## 2020-10-09 MED ORDER — ATENOLOL 50 MG PO TABS
100.0000 mg | ORAL_TABLET | Freq: Every morning | ORAL | Status: DC
  Administered 2020-10-10: 100 mg via ORAL
  Filled 2020-10-09: qty 2

## 2020-10-09 MED ORDER — PRAVASTATIN SODIUM 20 MG PO TABS: 40.0000 mg | ORAL_TABLET | Freq: Every day | ORAL | Status: DC

## 2020-10-09 MED ORDER — ONDANSETRON HCL 4 MG PO TABS: 4.0000 mg | ORAL_TABLET | Freq: Four times a day (QID) | ORAL | Status: DC | PRN

## 2020-10-09 MED ORDER — POVIDONE-IODINE 10 % EX SWAB
2.0000 "application " | Freq: Once | CUTANEOUS | Status: AC
  Administered 2020-10-09: 2 via TOPICAL

## 2020-10-09 MED ORDER — VANCOMYCIN HCL 1000 MG IV SOLR
INTRAVENOUS | Status: AC
  Filled 2020-10-09: qty 1000

## 2020-10-09 MED ORDER — ONDANSETRON HCL 4 MG/2ML IJ SOLN: 4.0000 mg | Freq: Once | INTRAMUSCULAR | Status: DC | PRN

## 2020-10-09 MED ORDER — ONDANSETRON HCL 4 MG/2ML IJ SOLN: 4.0000 mg | Freq: Four times a day (QID) | INTRAMUSCULAR | Status: DC | PRN

## 2020-10-09 MED ORDER — LACTATED RINGERS IV SOLN: INTRAVENOUS | Status: DC

## 2020-10-09 MED ORDER — CEFAZOLIN SODIUM-DEXTROSE 2-4 GM/100ML-% IV SOLN
2.0000 g | Freq: Four times a day (QID) | INTRAVENOUS | Status: AC
  Administered 2020-10-09 (×2): 2 g via INTRAVENOUS
  Filled 2020-10-09 (×2): qty 100

## 2020-10-09 MED ORDER — CHLORHEXIDINE GLUCONATE 0.12 % MT SOLN: 15.0000 mL | Freq: Once | OROMUCOSAL | Status: AC

## 2020-10-09 MED ORDER — ROPIVACAINE HCL 7.5 MG/ML IJ SOLN
INTRAMUSCULAR | Status: DC | PRN
  Administered 2020-10-09: 30 mL via PERINEURAL

## 2020-10-09 MED ORDER — SODIUM CHLORIDE (PF) 0.9 % IJ SOLN
INTRAMUSCULAR | Status: DC | PRN
  Administered 2020-10-09: 30 mL

## 2020-10-09 MED ORDER — EPHEDRINE 5 MG/ML INJ
INTRAVENOUS | Status: AC
  Filled 2020-10-09: qty 10

## 2020-10-09 MED ORDER — HYDROCODONE-ACETAMINOPHEN 7.5-325 MG PO TABS
1.0000 | ORAL_TABLET | ORAL | Status: DC | PRN
Start: 2020-10-09 — End: 2020-10-10

## 2020-10-09 MED ORDER — PROPOFOL 500 MG/50ML IV EMUL
INTRAVENOUS | Status: DC | PRN
  Administered 2020-10-09: 25 ug/kg/min via INTRAVENOUS

## 2020-10-09 MED ORDER — MEPERIDINE HCL 50 MG/ML IJ SOLN: 6.2500 mg | INTRAMUSCULAR | Status: DC | PRN

## 2020-10-09 MED ORDER — METOCLOPRAMIDE HCL 5 MG PO TABS
5.0000 mg | ORAL_TABLET | Freq: Three times a day (TID) | ORAL | Status: DC | PRN
Start: 2020-10-09 — End: 2020-10-10

## 2020-10-09 MED ORDER — TRANEXAMIC ACID-NACL 1000-0.7 MG/100ML-% IV SOLN
1000.0000 mg | Freq: Once | INTRAVENOUS | Status: AC
  Administered 2020-10-09: 1000 mg via INTRAVENOUS
  Filled 2020-10-09: qty 100

## 2020-10-09 MED ORDER — MORPHINE SULFATE (PF) 2 MG/ML IV SOLN
0.5000 mg | INTRAVENOUS | Status: DC | PRN
Start: 2020-10-09 — End: 2020-10-10

## 2020-10-09 MED ORDER — DOCUSATE SODIUM 100 MG PO CAPS
100.0000 mg | ORAL_CAPSULE | Freq: Two times a day (BID) | ORAL | Status: DC
  Administered 2020-10-09 – 2020-10-10 (×2): 100 mg via ORAL
  Filled 2020-10-09 (×2): qty 1

## 2020-10-09 MED ORDER — KETOROLAC TROMETHAMINE 30 MG/ML IJ SOLN
INTRAMUSCULAR | Status: AC
  Filled 2020-10-09: qty 1

## 2020-10-09 MED ORDER — DIPHENHYDRAMINE HCL 12.5 MG/5ML PO ELIX
12.5000 mg | ORAL_SOLUTION | ORAL | Status: DC | PRN
Start: 2020-10-09 — End: 2020-10-10

## 2020-10-09 MED ORDER — STERILE WATER FOR IRRIGATION IR SOLN
Status: DC | PRN
  Administered 2020-10-09: 2000 mL

## 2020-10-09 MED ORDER — FENTANYL CITRATE (PF) 100 MCG/2ML IJ SOLN
INTRAMUSCULAR | Status: AC
  Filled 2020-10-09: qty 2

## 2020-10-09 MED ORDER — ORAL CARE MOUTH RINSE
15.0000 mL | Freq: Once | OROMUCOSAL | Status: AC
  Administered 2020-10-09: 15 mL via OROMUCOSAL

## 2020-10-09 MED ORDER — PROPOFOL 10 MG/ML IV BOLUS
INTRAVENOUS | Status: DC | PRN
  Administered 2020-10-09: 10 mg via INTRAVENOUS
  Administered 2020-10-09: 5 mg via INTRAVENOUS

## 2020-10-09 MED ORDER — METOCLOPRAMIDE HCL 5 MG/ML IJ SOLN: 5.0000 mg | Freq: Three times a day (TID) | INTRAMUSCULAR | Status: DC | PRN

## 2020-10-09 MED ORDER — FERROUS SULFATE 325 (65 FE) MG PO TABS
325.0000 mg | ORAL_TABLET | Freq: Two times a day (BID) | ORAL | Status: DC
  Administered 2020-10-10: 325 mg via ORAL
  Filled 2020-10-09: qty 1

## 2020-10-09 MED ORDER — EPHEDRINE SULFATE 50 MG/ML IJ SOLN
INTRAMUSCULAR | Status: DC | PRN
  Administered 2020-10-09: 10 mg via INTRAVENOUS

## 2020-10-09 MED ORDER — ACETAMINOPHEN 325 MG PO TABS: 325.0000 mg | ORAL_TABLET | Freq: Four times a day (QID) | ORAL | Status: DC | PRN

## 2020-10-09 MED ORDER — BUPIVACAINE-EPINEPHRINE (PF) 0.25% -1:200000 IJ SOLN
INTRAMUSCULAR | Status: AC
  Filled 2020-10-09: qty 30

## 2020-10-09 MED ORDER — ACETAMINOPHEN 160 MG/5ML PO SOLN: 325.0000 mg | ORAL | Status: DC | PRN

## 2020-10-09 MED ORDER — CHLORHEXIDINE GLUCONATE CLOTH 2 % EX PADS
6.0000 | MEDICATED_PAD | Freq: Every day | CUTANEOUS | Status: DC
  Administered 2020-10-10: 6 via TOPICAL

## 2020-10-09 MED ORDER — POLYETHYLENE GLYCOL 3350 17 G PO PACK
17.0000 g | PACK | Freq: Two times a day (BID) | ORAL | Status: DC
  Administered 2020-10-09: 17 g via ORAL
  Filled 2020-10-09: qty 1

## 2020-10-09 MED ORDER — 0.9 % SODIUM CHLORIDE (POUR BTL) OPTIME
TOPICAL | Status: DC | PRN
  Administered 2020-10-09: 1000 mL

## 2020-10-09 MED ORDER — HYDROCODONE-ACETAMINOPHEN 5-325 MG PO TABS
1.0000 | ORAL_TABLET | ORAL | Status: DC | PRN
Start: 2020-10-09 — End: 2020-10-10
  Administered 2020-10-09 – 2020-10-10 (×3): 2 via ORAL
  Filled 2020-10-09 (×3): qty 2

## 2020-10-09 MED ORDER — SODIUM CHLORIDE 0.9 % IV SOLN: INTRAVENOUS | Status: DC

## 2020-10-09 SURGICAL SUPPLY — 56 items
ATTUNE MED ANAT PAT 35 KNEE (Knees) ×2 IMPLANT
ATTUNE PSFEM LTSZ5 NARCEM KNEE (Femur) ×2 IMPLANT
ATTUNE PSRP INSR SZ5 6 KNEE (Insert) ×2 IMPLANT
BAG ZIPLOCK 12X15 (MISCELLANEOUS) IMPLANT
BASE TIBIAL ROT PLAT SZ 5 KNEE (Knees) ×1 IMPLANT
BLADE SAW SGTL 11.0X1.19X90.0M (BLADE) IMPLANT
BLADE SAW SGTL 13.0X1.19X90.0M (BLADE) ×2 IMPLANT
BLADE SURG SZ10 CARB STEEL (BLADE) ×4 IMPLANT
BNDG ELASTIC 6X10 VLCR STRL LF (GAUZE/BANDAGES/DRESSINGS) ×2 IMPLANT
BNDG ELASTIC 6X5.8 VLCR STR LF (GAUZE/BANDAGES/DRESSINGS) ×2 IMPLANT
BOWL SMART MIX CTS (DISPOSABLE) ×2 IMPLANT
CEMENT HV SMART SET (Cement) ×2 IMPLANT
COVER WAND RF STERILE (DRAPES) IMPLANT
CUFF TOURN SGL QUICK 34 (TOURNIQUET CUFF) ×1
CUFF TRNQT CYL 34X4.125X (TOURNIQUET CUFF) ×1 IMPLANT
DECANTER SPIKE VIAL GLASS SM (MISCELLANEOUS) ×4 IMPLANT
DERMABOND ADVANCED (GAUZE/BANDAGES/DRESSINGS) ×1
DERMABOND ADVANCED .7 DNX12 (GAUZE/BANDAGES/DRESSINGS) ×1 IMPLANT
DRAPE U-SHAPE 47X51 STRL (DRAPES) ×2 IMPLANT
DRESSING AQUACEL AG SP 3.5X10 (GAUZE/BANDAGES/DRESSINGS) ×1 IMPLANT
DRSG AQUACEL AG SP 3.5X10 (GAUZE/BANDAGES/DRESSINGS) ×2
DURAPREP 26ML APPLICATOR (WOUND CARE) ×4 IMPLANT
ELECT REM PT RETURN 15FT ADLT (MISCELLANEOUS) ×2 IMPLANT
GLOVE ORTHO TXT STRL SZ7.5 (GLOVE) ×2 IMPLANT
GLOVE SURG ENC MOIS LTX SZ6 (GLOVE) ×2 IMPLANT
GLOVE SURG LTX SZ8 (GLOVE) ×2 IMPLANT
GLOVE SURG UNDER POLY LF SZ6.5 (GLOVE) ×2 IMPLANT
GLOVE SURG UNDER POLY LF SZ7.5 (GLOVE) ×2 IMPLANT
GLOVE SURG UNDER POLY LF SZ8.5 (GLOVE) IMPLANT
GOWN STRL REUS W/TWL 2XL LVL3 (GOWN DISPOSABLE) IMPLANT
GOWN STRL REUS W/TWL LRG LVL3 (GOWN DISPOSABLE) ×4 IMPLANT
HANDPIECE INTERPULSE COAX TIP (DISPOSABLE) ×1
HOLDER FOLEY CATH W/STRAP (MISCELLANEOUS) ×2 IMPLANT
KIT TURNOVER KIT A (KITS) ×2 IMPLANT
MANIFOLD NEPTUNE II (INSTRUMENTS) ×2 IMPLANT
NDL SAFETY ECLIPSE 18X1.5 (NEEDLE) ×1 IMPLANT
NEEDLE HYPO 18GX1.5 SHARP (NEEDLE) ×1
NS IRRIG 1000ML POUR BTL (IV SOLUTION) ×2 IMPLANT
PACK TOTAL KNEE CUSTOM (KITS) ×2 IMPLANT
PENCIL SMOKE EVACUATOR (MISCELLANEOUS) IMPLANT
PIN DRILL FIX HALF THREAD (BIT) ×2 IMPLANT
PIN FIX SIGMA LCS THRD HI (PIN) ×2 IMPLANT
PROTECTOR NERVE ULNAR (MISCELLANEOUS) ×2 IMPLANT
SET HNDPC FAN SPRY TIP SCT (DISPOSABLE) ×1 IMPLANT
SET PAD KNEE POSITIONER (MISCELLANEOUS) ×2 IMPLANT
SUT MNCRL AB 4-0 PS2 18 (SUTURE) ×2 IMPLANT
SUT STRATAFIX PDS+ 0 24IN (SUTURE) ×2 IMPLANT
SUT VIC AB 1 CT1 36 (SUTURE) ×2 IMPLANT
SUT VIC AB 2-0 CT1 27 (SUTURE) ×3
SUT VIC AB 2-0 CT1 TAPERPNT 27 (SUTURE) ×3 IMPLANT
SYR 3ML LL SCALE MARK (SYRINGE) ×4 IMPLANT
TIBIAL BASE ROT PLAT SZ 5 KNEE (Knees) ×2 IMPLANT
TRAY FOLEY MTR SLVR 16FR STAT (SET/KITS/TRAYS/PACK) ×2 IMPLANT
TUBE SUCTION HIGH CAP CLEAR NV (SUCTIONS) ×2 IMPLANT
WATER STERILE IRR 1000ML POUR (IV SOLUTION) ×4 IMPLANT
WRAP KNEE MAXI GEL POST OP (GAUZE/BANDAGES/DRESSINGS) ×2 IMPLANT

## 2020-10-09 NOTE — Interval H&P Note (Signed)
History and Physical Interval Note:  10/09/2020 7:02 AM  Chad [name]  has presented today for surgery, with the diagnosis of Left knee osteoarthritis.  The various methods of treatment have been discussed with the patient and family. After consideration of risks, benefits and other options for treatment, the patient has consented to  Procedure(s) with comments: TOTAL KNEE ARTHROPLASTY (Left) - 70 mins as a surgical intervention.  The patient's history has been reviewed, patient examined, no change in status, stable for surgery.  I have reviewed the patient's chart and labs.  Questions were answered to the patient's satisfaction.     [name]

## 2020-10-09 NOTE — Anesthesia Postprocedure Evaluation (Signed)
Anesthesia Post Note  Patient: Chad [name]  Procedure(s) Performed: TOTAL KNEE ARTHROPLASTY (Left Knee)     Patient location during evaluation: PACU Anesthesia Type: MAC and Spinal Level of consciousness: awake and alert Pain management: pain level controlled Vital Signs Assessment: post-procedure vital signs reviewed and stable Respiratory status: spontaneous breathing, nonlabored ventilation, respiratory function stable and patient connected to nasal cannula oxygen Cardiovascular status: stable and blood pressure returned to baseline Postop Assessment: no apparent nausea or vomiting Anesthetic complications: no   No complications documented.  Last Vitals:  Vitals:   10/09/20 0915 10/09/20 0930  BP: 129/62 138/65  Pulse: (!) 51 (!) 55  Resp: 16 13  Temp:    SpO2: 100% 100%    Last Pain:  Vitals:   10/09/20 0930  TempSrc:   PainSc: 0-No pain                  [place]

## 2020-10-09 NOTE — Discharge Instructions (Signed)

## 2020-10-09 NOTE — Anesthesia Procedure Notes (Signed)
Anesthesia Regional Block: Adductor canal block   Pre-Anesthetic Checklist: ,, timeout performed, Correct Patient, Correct Site, Correct Laterality, Correct Procedure, Correct Position, site marked, Risks and benefits discussed,  Surgical consent,  Pre-op evaluation,  At surgeon's request and post-op pain management  Laterality: Left  Prep: chloraprep       Needles:  Injection technique: Single-shot  Needle Type: Echogenic Stimulator Needle     Needle Length: 5cm  Needle Gauge: 22     Additional Needles:   Procedures:, nerve stimulator,,, ultrasound used (permanent image in chart),,,,  Narrative:  Start time: 10/09/2020 6:55 AM End time: 10/09/2020 6:58 AM Injection made incrementally with aspirations every 5 mL.  Performed by: Personally  Anesthesiologist: Janeece Riggers, MD  Additional Notes: Functioning IV was confirmed and monitors were applied.  A 48m 22ga Arrow echogenic stimulator needle was used. Sterile prep and drape,hand hygiene and sterile gloves were used. Ultrasound guidance: relevant anatomy identified, needle position confirmed, local anesthetic spread visualized around nerve(s)., vascular puncture avoided.  Image printed for medical record. Negative aspiration and negative test dose prior to incremental administration of local anesthetic. The patient tolerated the procedure well.

## 2020-10-09 NOTE — Transfer of Care (Signed)
Immediate Anesthesia Transfer of Care Note  Patient: Chad [name]  Procedure(s) Performed: TOTAL KNEE ARTHROPLASTY (Left Knee)  Patient Location: PACU  Anesthesia Type:Spinal  Level of Consciousness: awake, alert , oriented and patient cooperative  Airway & Oxygen Therapy: Patient Spontanous Breathing and Patient connected to face mask oxygen  Post-op Assessment: Report given to RN and Post -op Vital signs reviewed and stable  Post vital signs: Reviewed and stable  Last Vitals:  Vitals Value Taken Time  BP    Temp    Pulse 55 10/09/20 0905  Resp 17 10/09/20 0905  SpO2 100 % 10/09/20 0905  Vitals shown include unvalidated device data.  Last Pain:  Vitals:   10/09/20 0557  TempSrc: Oral         Complications: No complications documented.

## 2020-10-09 NOTE — Evaluation (Signed)
Physical Therapy Evaluation Patient Details Name: Chad [name] MRN: [id number] DOB: 07-11-1932 Today's Date: 10/09/2020   History of Present Illness  Pt is 87 yo male admitted on 10/09/20 for L TKA. PMH includes HTN and R TKA in 2014.  Clinical Impression  Pt is s/p L TKA seen on DOS resulting in the deficits listed below (see PT Problem List). Pt making excellent progress on DOS.  He had no c/o pain and was able to ambulate 150' with RW without difficulty.  Pt with excellent quad activation and ROM.  Pt very motivated and independent at baseline.  Pt expected to progress well. Pt will benefit from skilled PT to increase their independence and safety with mobility to allow discharge to the venue listed below.      Follow Up Recommendations Follow surgeon's recommendation for DC plan and follow-up therapies;Supervision/Assistance - 24 hour    Equipment Recommendations  Rolling walker with 5" wheels (reports his is old)    Recommendations for Other Services       Precautions / Restrictions Precautions Precautions: Fall Restrictions Weight Bearing Restrictions: No      Mobility  Bed Mobility Overal bed mobility: Needs Assistance Bed Mobility: Supine to Sit     Supine to sit: Supervision     General bed mobility comments: Cues to scoot to EOB    Transfers Overall transfer level: Needs assistance Equipment used: Rolling walker (2 wheeled) Transfers: Sit to/from Stand Sit to Stand: Min guard         General transfer comment: Cues for safe hand placement and L LE management  Ambulation/Gait Ambulation/Gait assistance: Min guard Gait Distance (Feet): 150 Feet Assistive device: Rolling walker (2 wheeled) Gait Pattern/deviations: Step-through pattern Gait velocity: normal   General Gait Details: Pt started with step to gait but quickly progressed on his own to step-through pattern with normal step length and speed.   Min cues for safety and RW use.  Stairs             Wheelchair Mobility    Modified Rankin (Stroke Patients Only)       Balance Overall balance assessment: Needs assistance Sitting-balance support: No upper extremity supported Sitting balance-Leahy Scale: Good     Standing balance support: Bilateral upper extremity supported;No upper extremity supported Standing balance-Leahy Scale: Fair Standing balance comment: static stand no AD; RW for ambulation                             Pertinent Vitals/Pain Pain Assessment: No/denies pain    Home Living Family/patient expects to be discharged to:: Private residence Living Arrangements: Alone Available Help at Discharge: Family;Available 24 hours/day (plans to go to daughter's house for a few days) Type of Home: House Home Access: Stairs to enter Entrance Stairs-Rails: Psychiatric nurse of Steps: 3 Home Layout: One level Home Equipment: Walker - 2 wheels;Tub bench Additional Comments: Information above on daughter's house    Prior Function Level of Independence: Independent         Comments: Reports independent with ADLs and IADLs; daughter assist with driving and shopping (pt reports he could, but his daughter just takes care of it); he could ambulate in community     Hand Dominance        Extremity/Trunk Assessment   Upper Extremity Assessment Upper Extremity Assessment: Overall WFL for tasks assessed    Lower Extremity Assessment Lower Extremity Assessment: LLE deficits/detail;RLE deficits/detail RLE Deficits / Details: WNL  RLE Sensation: WNL LLE Deficits / Details: ROM: ankle and hip WFL, knee 0 to 90 degrees without difficulty; MMT: ankle 5/5 , hip and knee 3/5 not further tested LLE Sensation: WNL    Cervical / Trunk Assessment Cervical / Trunk Assessment: Normal  Communication   Communication: HOH  Cognition Arousal/Alertness: Awake/alert Behavior During Therapy: WFL for tasks assessed/performed Overall Cognitive  Status: Within Functional Limits for tasks assessed                                 General Comments: Sometimes needs repetition due to [place] comments (skin integrity, edema, etc.): VSS on RA; encouraged to do ankle pumps and quad sets tonight    Exercises Total Joint Exercises Ankle Circles/Pumps: AROM;Both;5 reps;Seated Quad Sets: AROM;Both;5 reps;Supine   Assessment/Plan    PT Assessment Patient needs continued PT services  PT Problem List Decreased strength;Decreased mobility;Decreased range of motion;Decreased activity tolerance;Decreased balance;Decreased knowledge of use of DME       PT Treatment Interventions DME instruction;Therapeutic activities;Modalities;Gait training;Therapeutic exercise;Patient/family education;Stair training;Balance training;Functional mobility training    PT Goals (Current goals can be found in the Care Plan section)  Acute Rehab PT Goals Patient Stated Goal: return home PT Goal Formulation: With patient Time For Goal Achievement: 10/23/20 Potential to Achieve Goals: Good Additional Goals Additional Goal #1: Pt will maintain L knee ROM 0 to 90 degrees for gait and stairs    Frequency 7X/week   Barriers to discharge        Co-evaluation               AM-PAC PT "6 Clicks" Mobility  Outcome Measure Help needed turning from your back to your side while in a flat bed without using bedrails?: A Little Help needed moving from lying on your back to sitting on the side of a flat bed without using bedrails?: A Little Help needed moving to and from a bed to a chair (including a wheelchair)?: A Little Help needed standing up from a chair using your arms (e.g., wheelchair or bedside chair)?: A Little Help needed to walk in hospital room?: A Little Help needed climbing 3-5 steps with a railing? : A Little 6 Click Score: 18    End of Session Equipment Utilized During Treatment: Gait belt Activity  Tolerance: Patient tolerated treatment well Patient left: with chair alarm set;in chair;with call bell/phone within reach Nurse Communication: Mobility status PT Visit Diagnosis: Other abnormalities of gait and mobility (R26.89);Muscle weakness (generalized) (M62.81)    Time: 1320-1350 PT Time Calculation (min) (ACUTE ONLY): 30 min   Charges:   PT Evaluation $PT Eval Moderate Complexity: 1 Melina Schools, PT Acute Rehab Services Pager [phone] [facility] [phone]   Karlton Lemon 10/09/2020, 2:33 PM

## 2020-10-09 NOTE — Care Plan (Signed)
Ortho Bundle Case Management Note  Patient Details  Name: Chad [name] MRN: [id number] Date of Birth: Dec 19, 1931                  L TKA on 10/09/20. DCP: Home with daughter. 1 story home with 0 steps. DME: Has RW. 3in1 ordered through [facility]. PT: EO 3/11   DME Arranged:  3-N-1 DME Agency:  [facility]  HH Arranged:    [place] Agency:     Additional Comments: Please contact me with any questions of if this plan should need to change.  Marianne Sofia, RN,CCM [facility]  [phone] 10/09/2020, 12:49 PM

## 2020-10-09 NOTE — Op Note (Signed)
NAME:  Chad [name]                      MEDICAL RECORD NO.:  [id number]                             FACILITY:  [facility]      PHYSICIAN:  Pietro Cassis. Alvan Dame, M.D.  DATE OF BIRTH:  02-Jul-1932      DATE OF PROCEDURE:  10/09/2020                                     OPERATIVE REPORT         PREOPERATIVE DIAGNOSIS:  Left knee osteoarthritis.      POSTOPERATIVE DIAGNOSIS:  Left knee osteoarthritis.      FINDINGS:  The patient was noted to have complete loss of cartilage and   bone-on-bone arthritis with associated osteophytes in the medial and patellofemoral compartments of   the knee with a significant synovitis and associated effusion.  The patient had failed months of conservative treatment including medications, injection therapy, activity modification.     PROCEDURE:  Left total knee replacement.      COMPONENTS USED:  DePuy Attune rotating platform posterior stabilized knee   system, a size 5N femur, 5 tibia, size 6 mm PS AOX insert, and 35 anatomic patellar   button.      SURGEON:  Pietro Cassis. Alvan Dame, M.D.      ASSISTANT:  Griffith Citron, PA-C.      ANESTHESIA:  Regional and Spinal.      SPECIMENS:  None.      COMPLICATION:  None.      DRAINS:  None.  EBL: <100cc      TOURNIQUET TIME:   Total Tourniquet Time Documented: Thigh (Left) - 29 minutes Total: Thigh (Left) - 29 minutes  .      The patient was stable to the recovery room.      INDICATION FOR PROCEDURE:  Chad [name] is a 87 y.o. male patient of   mine.  The patient had been seen, evaluated, and treated for months conservatively in the   office with medication, activity modification, and injections.  The patient had   radiographic changes of bone-on-bone arthritis with endplate sclerosis and osteophytes noted.  Based on the radiographic changes and failed conservative measures, the patient   decided to proceed with definitive treatment, total knee replacement.  Risks of infection, DVT, component failure, need  for revision surgery, neurovascular injury were reviewed in the office setting.  The postop course was reviewed stressing the efforts to maximize post-operative satisfaction and function.  Consent was obtained for benefit of pain   relief.      PROCEDURE IN DETAIL:  The patient was brought to the operative theater.   Once adequate anesthesia, preoperative antibiotics, 2 gm of Ancef,1 gm of Tranexamic Acid, and 10 mg of Decadron administered, the patient was positioned supine with a left thigh tourniquet placed.  The  left lower extremity was prepped and draped in sterile fashion.  A time-   out was performed identifying the patient, planned procedure, and the appropriate extremity.      The left lower extremity was placed in the [facility] leg holder.  The leg was   exsanguinated, tourniquet elevated to 250 mmHg.  A midline incision was   made  followed by median parapatellar arthrotomy.  Following initial   exposure, attention was first directed to the patella.  Precut   measurement was noted to be 22 mm.  I resected down to 13 mm and used a   35 anatomic patellar button to restore patellar height as well as cover the cut surface.      The lug holes were drilled and a metal shim was placed to protect the   patella from retractors and saw blade during the procedure.      At this point, attention was now directed to the femur.  The femoral   canal was opened with a drill, irrigated to try to prevent fat emboli.  An   intramedullary rod was passed at 5 degrees valgus, 9 mm of bone was   resected off the distal femur.  Following this resection, the tibia was   subluxated anteriorly.  Using the extramedullary guide, 2 mm of bone was resected off   the proximal medial tibia.  We confirmed the gap would be   stable medially and laterally with a size 5 spacer block as well as confirmed that the tibial cut was perpendicular in the coronal plane, checking with an alignment rod.      Once this was done, I  sized the femur to be a size 5 in the anterior-   posterior dimension, chose a narrow component based on medial and   lateral dimension.  The size 5 rotation block was then pinned in   position anterior referenced using the C-clamp to set rotation.  The   anterior, posterior, and  chamfer cuts were made without difficulty nor   notching making certain that I was along the anterior cortex to help   with flexion gap stability.      The final box cut was made off the lateral aspect of distal femur. Posterior osteophytes were removed.     At this point, the tibia was sized to be a size 5.  The size 5 tray was   then pinned in position through the medial third of the tubercle,   drilled, and keel punched.  Trial reduction was now carried with a 5 femur,  5 tibia, a size 6 mm PS insert, and the 35 anatomic patella [place].  The knee was brought to full extension with good flexion stability with the patella   tracking through the trochlea without application of pressure.  Given   all these findings the trial components removed.  Final components were   opened and cement was mixed.  The knee was irrigated with normal saline solution and pulse lavage.  The synovial lining was   then injected with 30 cc of 0.25% Marcaine with epinephrine, 1 cc of Toradol and 30 cc of NS for a total of 61 cc.     Final implants were then cemented onto cleaned and dried cut surfaces of bone with the knee brought to extension with a size 6 mm PS trial insert.      Once the cement had fully cured, excess cement was removed   throughout the knee.  I confirmed that I was satisfied with the range of   motion and stability, and the final size 6 mm PS AOX insert was chosen.  It was   placed into the knee.      The tourniquet had been let down at 29 minutes.  No significant   hemostasis was required.  The extensor mechanism was then reapproximated using #  1 Vicryl and #1 Stratafix sutures with the knee   in flexion.  The    remaining wound was closed with 2-0 Vicryl and running 4-0 Monocryl.   The knee was cleaned, dried, dressed sterilely using Dermabond and   Aquacel dressing.  The patient was then   brought to recovery room in stable condition, tolerating the procedure   well.   Please note that Physician Assistant, Griffith Citron, PA-C was present for the entirety of the case, and was utilized for pre-operative positioning, peri-operative retractor management, general facilitation of the procedure and for primary wound closure at the end of the case.              Pietro Cassis Alvan Dame, M.D.    10/09/2020 8:33 AM

## 2020-10-09 NOTE — Anesthesia Procedure Notes (Signed)
Spinal  Patient location during procedure: OR Start time: 10/09/2020 7:25 AM End time: 10/09/2020 7:28 AM Staffing Anesthesiologist: Janeece Riggers, MD Preanesthetic Checklist Completed: patient identified, IV checked, site marked, risks and benefits discussed, surgical consent, monitors and equipment checked, pre-op evaluation and timeout performed Spinal Block Patient position: sitting Prep: DuraPrep Patient monitoring: heart rate, cardiac monitor, continuous pulse ox and blood pressure Approach: midline Location: L3-4 Injection technique: single-shot Needle Needle type: Sprotte  Needle gauge: 24 G Needle length: 9 cm Assessment Sensory level: T4

## 2020-10-10 ENCOUNTER — Other Ambulatory Visit: Payer: Self-pay

## 2020-10-10 ENCOUNTER — Encounter (HOSPITAL_COMMUNITY): Payer: Self-pay | Admitting: Orthopedic Surgery

## 2020-10-10 DIAGNOSIS — Z79899 Other long term (current) drug therapy: Secondary | ICD-10-CM | POA: Diagnosis not present

## 2020-10-10 DIAGNOSIS — Z96651 Presence of right artificial knee joint: Secondary | ICD-10-CM | POA: Diagnosis not present

## 2020-10-10 DIAGNOSIS — I10 Essential (primary) hypertension: Secondary | ICD-10-CM | POA: Diagnosis not present

## 2020-10-10 DIAGNOSIS — M25562 Pain in left knee: Secondary | ICD-10-CM | POA: Diagnosis not present

## 2020-10-10 DIAGNOSIS — M1712 Unilateral primary osteoarthritis, left knee: Secondary | ICD-10-CM | POA: Diagnosis not present

## 2020-10-10 LAB — CBC
HCT: 27.8 % — ABNORMAL LOW (ref 39.0–52.0)
Hemoglobin: 9 g/dL — ABNORMAL LOW (ref 13.0–17.0)
MCH: 26.9 pg (ref 26.0–34.0)
MCHC: 32.4 g/dL (ref 30.0–36.0)
MCV: 83.2 fL (ref 80.0–100.0)
Platelets: 176 10*3/uL (ref 150–400)
RBC: 3.34 MIL/uL — ABNORMAL LOW (ref 4.22–5.81)
RDW: 16 % — ABNORMAL HIGH (ref 11.5–15.5)
WBC: 6.7 10*3/uL (ref 4.0–10.5)
nRBC: 0 % (ref 0.0–0.2)

## 2020-10-10 LAB — BASIC METABOLIC PANEL
Anion gap: 11 (ref 5–15)
BUN: 32 mg/dL — ABNORMAL HIGH (ref 8–23)
CO2: 23 mmol/L (ref 22–32)
Calcium: 9.1 mg/dL (ref 8.9–10.3)
Chloride: 106 mmol/L (ref 98–111)
Creatinine, Ser: 1.26 mg/dL — ABNORMAL HIGH (ref 0.61–1.24)
GFR, Estimated: 56 mL/min — ABNORMAL LOW (ref >60)
Glucose, Bld: 141 mg/dL — ABNORMAL HIGH (ref 70–99)
Potassium: 3.7 mmol/L (ref 3.5–5.1)
Sodium: 140 mmol/L (ref 135–145)

## 2020-10-10 MED ORDER — METHOCARBAMOL 500 MG PO TABS: 500.0000 mg | ORAL_TABLET | Freq: Four times a day (QID) | ORAL | 0 refills | Status: DC | PRN

## 2020-10-10 MED ORDER — ASPIRIN 81 MG PO CHEW: 81.0000 mg | CHEWABLE_TABLET | Freq: Two times a day (BID) | ORAL | 0 refills | Status: AC

## 2020-10-10 MED ORDER — FERROUS SULFATE 325 (65 FE) MG PO TABS: 325.0000 mg | ORAL_TABLET | Freq: Two times a day (BID) | ORAL | 0 refills | Status: AC

## 2020-10-10 MED ORDER — POLYETHYLENE GLYCOL 3350 17 G PO PACK: 17.0000 g | PACK | Freq: Two times a day (BID) | ORAL | 0 refills | Status: DC

## 2020-10-10 MED ORDER — DOCUSATE SODIUM 100 MG PO CAPS: 100.0000 mg | ORAL_CAPSULE | Freq: Two times a day (BID) | ORAL | 0 refills | Status: AC

## 2020-10-10 MED ORDER — HYDROCODONE-ACETAMINOPHEN 5-325 MG PO TABS: 1.0000 | ORAL_TABLET | Freq: Four times a day (QID) | ORAL | 0 refills | Status: DC | PRN

## 2020-10-10 NOTE — TOC Transition Note (Signed)
Transition of Care [facility]) - CM/SW Discharge Note   Patient Details  Name: Chad [name] MRN: [id number] Date of Birth: 14-Nov-1931  [facility] of [facility]) CM/SW Contact:  Lennart Pall, LCSW Phone Number: 10/10/2020, 9:37 AM   Clinical Narrative:     Met briefly with pt who is part of ortho bundle.  Confirming need for 3n1 commode - already ordered with [employer].  Plan OPPT at [facility].  No further TOC needs.  Final next level of care: OP Rehab Barriers to Discharge: No Barriers Identified   Patient Goals and CMS Choice Patient states their goals for this hospitalization and ongoing recovery are:: return home      Discharge Placement                       Discharge Plan and Services                DME Arranged: 3-N-1 DME Agency: [place]                  Social Determinants of Health (SDOH) Interventions     Readmission Risk Interventions No flowsheet data found.

## 2020-10-10 NOTE — Progress Notes (Signed)
Physical Therapy Treatment Patient Details Name: Chad [name] MRN: [id number] DOB: [birth year] Today's Date: 10/10/2020    History of Present Illness Pt is 87 yo male admitted on 10/09/20 for L TKA. PMH includes HTN and R TKA in 2014.    PT Comments    POD # 1 am session Assisted OOB to amb a great distance in hallway.  Practiced stairs.  Then returned to room to perform some TE's following HEP handout.  Instructed on proper tech, freq as well as use of ICE.   Addressed all mobility questions, discussed appropriate activity, educated on use of ICE.  Pt ready for D/C to home.   Follow Up Recommendations  Follow surgeon's recommendation for DC plan and follow-up therapies;Supervision/Assistance - 24 hour     Equipment Recommendations  Rolling walker with 5" wheels    Recommendations for Other Services       Precautions / Restrictions Precautions Precautions: Fall Precaution Comments: instructed no pillow under knee Restrictions Weight Bearing Restrictions: No Other Position/Activity Restrictions: WBAT    Mobility  Bed Mobility Overal bed mobility: Needs Assistance Bed Mobility: Supine to Sit     Supine to sit: Supervision     General bed mobility comments: Cues to scoot to EOB and increased time    Transfers Overall transfer level: Needs assistance Equipment used: Rolling walker (2 wheeled) Transfers: Sit to/from [facility] Sit to Stand: Supervision Stand pivot transfers: Supervision;Min guard       General transfer comment: Cues for safe hand placement and L LE management  Ambulation/Gait Ambulation/Gait assistance: Supervision;Min guard Gait Distance (Feet): 220 Feet Assistive device: Rolling walker (2 wheeled) Gait Pattern/deviations: Step-through pattern Gait velocity: WFL   General Gait Details: 25% VC's on proper seqencing and walker to self distance as well as safety with turns   Stairs Stairs: Yes Stairs assistance:  Supervision;Min guard Stair Management: One rail Right;Step to pattern;Sideways;Forwards Number of Stairs: 2 General stair comments: 25% VC's on proper sequencing and safety   Wheelchair Mobility    Modified Rankin (Stroke Patients Only)       Balance                                            Cognition Arousal/Alertness: Awake/alert Behavior During Therapy: WFL for tasks assessed/performed Overall Cognitive Status: Within Functional Limits for tasks assessed                                 General Comments: Sometimes needs repetition due to [facility]      Exercises   Total Knee Replacement TE's following HEP handout 10 reps B LE ankle pumps 05 reps towel squeezes 05 reps knee presses 05 reps heel slides  05 reps SAQ's 05 reps SLR's 05 reps ABD Educated on use of gait belt to assist with TE's Followed by ICE     General Comments        Pertinent Vitals/Pain Pain Assessment: 0-10 Pain Score: 3  Pain Location: L knee Pain Descriptors / Indicators: Aching;Operative site guarding Pain Intervention(s): Monitored during session;Premedicated before session;Repositioned;Ice applied    Home Living                      Prior Function            PT  Goals (current goals can now be found in the care plan section) Progress towards PT goals: Progressing toward goals    Frequency    7X/week      PT Plan Current plan remains appropriate    Co-evaluation              AM-PAC PT "6 Clicks" Mobility   Outcome Measure  Help needed turning from your back to your side while in a flat bed without using bedrails?: None Help needed moving from lying on your back to sitting on the side of a flat bed without using bedrails?: None Help needed moving to and from a bed to a chair (including a wheelchair)?: None Help needed standing up from a chair using your arms (e.g., wheelchair or bedside chair)?: A Little Help needed to walk  in hospital room?: A Little Help needed climbing 3-5 steps with a railing? : A Lot 6 Click Score: 20    End of Session Equipment Utilized During Treatment: Gait belt Activity Tolerance: Patient tolerated treatment well Patient left: with chair alarm set;in chair;with call bell/phone within reach Nurse Communication: Mobility status PT Visit Diagnosis: Other abnormalities of gait and mobility (R26.89);Muscle weakness (generalized) (M62.81)     Time: [id number] PT Time Calculation (min) (ACUTE ONLY): 25 min  Charges:  $Gait Training: 8-22 mins $Therapeutic Exercise: 8-22 mins                     Rica Koyanagi  PTA Acute  Rehabilitation Services Pager      [phone] Office      [phone]

## 2020-10-10 NOTE — Progress Notes (Signed)
Pt. Discharged via wheelchair accompanied by staff. Pt. Daughter came to pick him up. Pt. Is alert and oriented, vital sign stable. Discharge instruction and education discussed with patient and daughter. All personal belongings are with the family.

## 2020-10-10 NOTE — Progress Notes (Signed)
   Subjective: 1 Day Post-Op Procedure(s) (LRB): TOTAL KNEE ARTHROPLASTY (Left) Patient reports pain as mild.   Patient seen in rounds with Dr. Alvan Dame. Patient is well, and has had no acute complaints or problems other than discomfort in the left knee. No acute events overnight. Ambulated 150 feet with PT yesterday. Foley catheter removed.  We will continue therapy today.   Objective: Vital signs in last 24 hours: Temp:  [97.2 F (36.2 C)-98 F (36.7 C)] 97.8 F (36.6 C) (03/09 0507) Pulse Rate:  [51-86] 81 (03/09 0507) Resp:  [12-28] 16 (03/09 0507) BP: (127-155)/(62-77) 150/74 (03/09 0507) SpO2:  [98 %-100 %] 99 % (03/09 0507) Weight:  [60.6 kg] 60.6 kg (03/08 1925)  Intake/Output from previous day:  Intake/Output Summary (Last 24 hours) at 10/10/2020 0809 Last data filed at 10/10/2020 0600 Gross per 24 hour  Intake 4087.5 ml  Output 3510 ml  Net 577.5 ml     Intake/Output this shift: No intake/output data recorded.  Labs: Recent Labs    10/10/20 0308  HGB 9.0*   Recent Labs    10/10/20 0308  WBC 6.7  RBC 3.34*  HCT 27.8*  PLT 176   Recent Labs    10/10/20 0308  NA 140  K 3.7  CL 106  CO2 23  BUN 32*  CREATININE 1.26*  GLUCOSE 141*  CALCIUM 9.1   No results for input(s): LABPT, INR in the last 72 hours.  Exam: General - Patient is Alert and Oriented Extremity - Neurologically intact Sensation intact distally Intact pulses distally Dorsiflexion/Plantar flexion intact Dressing - dressing C/D/I Motor Function - intact, moving foot and toes well on exam.   Past Medical History:  Diagnosis Date  . Arthritis   . Heart murmur    asa child   . Hypertension     Assessment/Plan: 1 Day Post-Op Procedure(s) (LRB): TOTAL KNEE ARTHROPLASTY (Left) Principal Problem:   Osteoarthritis of left knee Active Problems:   S/P left TKA   S/P total knee arthroplasty, left  Estimated body mass index is 21.56 kg/m as calculated from the following:   Height  as of this encounter: '5\' 6"'$  (1.676 m).   Weight as of this encounter: 60.6 kg. Advance diet Up with therapy D/C IV fluids   Patient's anticipated LOS is less than 2 midnights, meeting these requirements: - Younger than 8 - Lives within 1 hour of care - Has a competent adult at home to recover with post-op recover - NO history of  - Chronic pain requiring opiods  - Diabetes  - Coronary Artery Disease  - Heart failure  - Heart attack  - Stroke  - DVT/VTE  - Cardiac arrhythmia  - Respiratory Failure/COPD  - Renal failure  - Anemia  - Advanced Liver disease   DVT Prophylaxis - Aspirin Weight bearing as tolerated.  Plan is to go Home after hospital stay with his daughters. Scheduled for OPPT at [facility] on 3/11. Plan for discharge today following 1-2 sessions of PT as long as he continues to meet his goals. Follow up in the office in 2 weeks.   Griffith Citron, PA-C Orthopedic Surgery [phone] 10/10/2020, 8:09 AM

## 2020-10-11 NOTE — Discharge Summary (Signed)
Physician Discharge Summary   Patient ID: Chad [name] MRN: [id number] DOB/AGE: [birth year] 87 y.o.  Admit date: 10/09/2020 Discharge date: 10/10/2020  Primary Diagnosis: Left knee osteoarthritis.   Admission Diagnoses:  Past Medical History:  Diagnosis Date  . Arthritis   . Heart murmur    asa child   . Hypertension    Discharge Diagnoses:   Principal Problem:   Osteoarthritis of left knee Active Problems:   S/P left TKA   S/P total knee arthroplasty, left  Estimated body mass index is 21.56 kg/m as calculated from the following:   Height as of this encounter: '5\' 6"'$  (1.676 m).   Weight as of this encounter: 60.6 kg.  Procedure:  Procedure(s) (LRB): TOTAL KNEE ARTHROPLASTY (Left)   Consults: None  HPI:  Chad [name] is a 87 y.o. male patient of   mine.  The patient had been seen, evaluated, and treated for months conservatively in the   office with medication, activity modification, and injections.  The patient had   radiographic changes of bone-on-bone arthritis with endplate sclerosis and osteophytes noted.  Based on the radiographic changes and failed conservative measures, the patient   decided to proceed with definitive treatment, total knee replacement.  Risks of infection, DVT, component failure, need for revision surgery, neurovascular injury were reviewed in the office setting.  The postop course was reviewed stressing the efforts to maximize post-operative satisfaction and function.  Consent was obtained for benefit of pain   relief.   Laboratory Data: Admission on 10/09/2020, Discharged on 10/10/2020  Component Date Value Ref Range Status  . WBC 10/10/2020 6.7  4.0 - 10.5 K/uL Final  . RBC 10/10/2020 3.34* 4.22 - 5.81 MIL/uL Final  . Hemoglobin 10/10/2020 9.0* 13.0 - 17.0 g/dL Final  . HCT 10/10/2020 27.8* 39.0 - 52.0 % Final  . MCV 10/10/2020 83.2  80.0 - 100.0 fL Final  . MCH 10/10/2020 26.9  26.0 - 34.0 pg Final  . MCHC 10/10/2020 32.4  30.0 -  36.0 g/dL Final  . RDW 10/10/2020 16.0* 11.5 - 15.5 % Final  . Platelets 10/10/2020 176  150 - 400 K/uL Final  . nRBC 10/10/2020 0.0  0.0 - 0.2 % Final   Performed at [facility], [place] [address] [place], [place] [address]  . Sodium 10/10/2020 140  135 - 145 mmol/L Final  . Potassium 10/10/2020 3.7  3.5 - 5.1 mmol/L Final  . Chloride 10/10/2020 106  98 - 111 mmol/L Final  . CO2 10/10/2020 23  22 - 32 mmol/L Final  . Glucose, Bld 10/10/2020 141* 70 - 99 mg/dL Final   Glucose reference range applies only to samples taken after fasting for at least 8 hours.  . BUN 10/10/2020 32* 8 - 23 mg/dL Final  . Creatinine, Ser 10/10/2020 1.26* 0.61 - 1.24 mg/dL Final  . Calcium 10/10/2020 9.1  8.9 - 10.3 mg/dL Final  . GFR, Estimated 10/10/2020 56* >60 mL/min Final   Comment: (NOTE) Calculated using the CKD-EPI Creatinine Equation (2021)   . Anion gap 10/10/2020 11  5 - 15 Final   Performed at [facility], [place] [address] [place], [place] [address]  Hospital Outpatient Visit on 10/08/2020  Component Date Value Ref Range Status  . SARS Coronavirus 2 10/08/2020 NEGATIVE  NEGATIVE Final   Comment: (NOTE) SARS-CoV-2 target nucleic acids are NOT DETECTED.  The SARS-CoV-2 RNA is generally detectable in upper and lower respiratory specimens during the acute phase of infection. Negative  results do not preclude SARS-CoV-2 infection, do not rule out co-infections with other pathogens, and should not be used as the sole basis for treatment or other patient management decisions. Negative results must be combined with clinical observations, patient history, and epidemiological information. The expected result is Negative.  Fact Sheet for Patients: SugarRoll.be  Fact Sheet for Healthcare Providers: https://www.woods-mathews.com/  This test is not yet approved or cleared by the [place] FDA and  has been authorized for  detection and/or diagnosis of SARS-CoV-2 by FDA under an Emergency Use Authorization (EUA). This EUA will remain  in effect (meaning this test can be used) for the duration of the COVID-19 declaration under Se                          ction 564(b)(1) of the Act, 21 U.S.C. section 360bbb-3(b)(1), unless the authorization is terminated or revoked sooner.  Performed at [facility], [place] [address] [place], [place] [address]   Hospital Outpatient Visit on 09/28/2020  Component Date Value Ref Range Status  . Sodium 09/28/2020 143  135 - 145 mmol/L Final  . Potassium 09/28/2020 4.7  3.5 - 5.1 mmol/L Final  . Chloride 09/28/2020 108  98 - 111 mmol/L Final  . CO2 09/28/2020 25  22 - 32 mmol/L Final  . Glucose, Bld 09/28/2020 105* 70 - 99 mg/dL Final   Glucose reference range applies only to samples taken after fasting for at least 8 hours.  . BUN 09/28/2020 37* 8 - 23 mg/dL Final  . Creatinine, Ser 09/28/2020 1.52* 0.61 - 1.24 mg/dL Final  . Calcium 09/28/2020 9.4  8.9 - 10.3 mg/dL Final  . GFR, Estimated 09/28/2020 44* >60 mL/min Final   Comment: (NOTE) Calculated using the CKD-EPI Creatinine Equation (2021)   . Anion gap 09/28/2020 10  5 - 15 Final   Performed at [facility], [place] [address] [place], [place] [address]  . WBC 09/28/2020 7.0  4.0 - 10.5 K/uL Final  . RBC 09/28/2020 3.72* 4.22 - 5.81 MIL/uL Final  . Hemoglobin 09/28/2020 10.1* 13.0 - 17.0 g/dL Final  . HCT 09/28/2020 31.3* 39.0 - 52.0 % Final  . MCV 09/28/2020 84.1  80.0 - 100.0 fL Final  . MCH 09/28/2020 27.2  26.0 - 34.0 pg Final  . MCHC 09/28/2020 32.3  30.0 - 36.0 g/dL Final  . RDW 09/28/2020 16.4* 11.5 - 15.5 % Final  . Platelets 09/28/2020 225  150 - 400 K/uL Final  . nRBC 09/28/2020 0.0  0.0 - 0.2 % Final   Performed at [facility], [place] [address] [place], [place] [address]  . ABO/RH(D) 09/28/2020 A POS   Final  . Antibody Screen 09/28/2020 NEG   Final  . Sample  Expiration 09/28/2020 10/12/2020,2359   Final  . Extend sample reason 09/28/2020    Final                   Value:NO TRANSFUSIONS OR PREGNANCY IN THE PAST 3 MONTHS Performed at [place] [address], [place], [place] [address]   . MRSA, PCR 09/28/2020 NEGATIVE  NEGATIVE Final  . Staphylococcus aureus 09/28/2020 NEGATIVE  NEGATIVE Final   Comment: (NOTE) The Xpert SA Assay (FDA approved for NASAL specimens in patients 83 years of age and older), is one component of a comprehensive surveillance program. It is not intended to diagnose infection nor to guide or monitor treatment. Performed at  [facility], [place] [address], [place], [place] [address]      X-Rays:No results found.  EKG: Orders placed or performed during the hospital encounter of 09/28/20  . EKG 12 lead per protocol  . EKG 12 lead per protocol     Hospital Course: Chad [name] is a 87 y.o. who was admitted to [facility]. They were brought to the operating room on 10/09/2020 and underwent Procedure(s): TOTAL KNEE ARTHROPLASTY.  Patient tolerated the procedure well and was later transferred to the recovery room and then to the orthopaedic floor for postoperative care. They were given PO and IV analgesics for pain control following their surgery. They were given 24 hours of postoperative antibiotics of  Anti-infectives (From admission, onward)   Start     Dose/Rate Route Frequency Ordered Stop   10/09/20 1400  ceFAZolin (ANCEF) IVPB 2g/100 mL premix        2 g 200 mL/hr over 30 Minutes Intravenous Every 6 hours 10/09/20 1132 10/09/20 2106   10/09/20 0600  ceFAZolin (ANCEF) IVPB 2g/100 mL premix        2 g 200 mL/hr over 30 Minutes Intravenous On call to O.R. 10/09/20 [id number] 10/09/20 0732     and started on DVT prophylaxis in the form of Aspirin.   PT and OT were ordered for total joint protocol. Discharge planning consulted to help with postop disposition and equipment  needs.  Patient had a good night on the evening of surgery. They started to get up OOB with therapy on POD #0. Pt was seen during rounds and was ready to go home pending progress with therapy.He worked with therapy on POD #1 and was meeting his goals. Pt was discharged to home later that day in stable condition.  Diet: Regular diet Activity: WBAT Follow-up: in 2 weeks Disposition: Home Discharged Condition: good   Discharge Instructions    Call MD / Call 911   Complete by: As directed    If you experience chest pain or shortness of breath, CALL 911 and be transported to the hospital emergency room.  If you develope a fever above 101 F, pus (white drainage) or increased drainage or redness at the wound, or calf pain, call your surgeon's office.   Change dressing   Complete by: As directed    Maintain surgical dressing until follow up in the clinic. If the edges start to pull up, may reinforce with tape. If the dressing is no longer working, may remove and cover with gauze and tape, but must keep the area dry and clean.  Call with any questions or concerns.   Constipation Prevention   Complete by: As directed    Drink plenty of fluids.  Prune juice may be helpful.  You may use a stool softener, such as Colace (over the counter) 100 mg twice a day.  Use MiraLax (over the counter) for constipation as needed.   Diet - low sodium heart healthy   Complete by: As directed    Discharge instructions   Complete by: As directed    Maintain surgical dressing until follow up in the clinic. If the edges start to pull up, may reinforce with tape. If the dressing is no longer working, may remove and cover with gauze and tape, but must keep the area dry and clean.  Follow up in 2 weeks at [facility]. Call with any questions or concerns.   Increase activity slowly as tolerated   Complete by: As directed  Weight bearing as tolerated with assist device (walker, cane, etc) as directed, use it as long as  suggested by your surgeon or therapist, typically at least 4-6 weeks.   TED hose   Complete by: As directed    Use stockings (TED hose) for 2 weeks on both leg(s).  You may remove them at night for sleeping.     Allergies as of 10/10/2020   No Known Allergies     Medication List    STOP taking these medications   HYDROcodone-acetaminophen 7.5-325 MG tablet Commonly known as: NORCO Replaced by: HYDROcodone-acetaminophen 5-325 MG tablet     TAKE these medications   amLODipine 10 MG tablet Commonly known as: NORVASC Take 10 mg by mouth every morning.   aspirin 81 MG chewable tablet Chew 1 tablet (81 mg total) by mouth 2 (two) times daily for 28 days.   atenolol 100 MG tablet Commonly known as: TENORMIN Take 100 mg by mouth every morning.   docusate sodium 100 MG capsule Commonly known as: COLACE Take 1 capsule (100 mg total) by mouth 2 (two) times daily.   ferrous sulfate 325 (65 FE) MG tablet Take 1 tablet (325 mg total) by mouth 2 (two) times daily with a meal for 14 days.   HYDROcodone-acetaminophen 5-325 MG tablet Commonly known as: NORCO/VICODIN Take 1-2 tablets by mouth every 6 (six) hours as needed for moderate pain (pain score 4-6). Replaces: HYDROcodone-acetaminophen 7.5-325 MG tablet   lovastatin 40 MG tablet Commonly known as: MEVACOR Take 40 mg by mouth at bedtime.   methocarbamol 500 MG tablet Commonly known as: ROBAXIN Take 1 tablet (500 mg total) by mouth every 6 (six) hours as needed for muscle spasms.   polyethylene glycol 17 g packet Commonly known as: MIRALAX / GLYCOLAX Take 17 g by mouth 2 (two) times daily.   tamsulosin 0.4 MG Caps capsule Commonly known as: FLOMAX Take 0.4 mg by mouth daily.            Discharge Care Instructions  (From admission, onward)         Start     Ordered   10/10/20 0000  Change dressing       Comments: Maintain surgical dressing until follow up in the clinic. If the edges start to pull up, may reinforce  with tape. If the dressing is no longer working, may remove and cover with gauze and tape, but must keep the area dry and clean.  Call with any questions or concerns.   10/10/20 0825          Follow-up Information    Paralee Cancel, MD. Schedule an appointment as soon as possible for a visit in 2 week(s).   Specialty: Orthopedic Surgery Contact information: [address] [place] [address] [phone]        Rosilyn Mings.. Go on 10/12/2020.   Why: You are scheduled for physical therapy eval on Friday March 11th at 10:15am. Contact information: [place] [address] [phone]               Signed: Griffith Citron, PA-C Orthopedic Surgery 10/11/2020, 10:21 AM

## 2020-10-12 DIAGNOSIS — M25562 Pain in left knee: Secondary | ICD-10-CM | POA: Diagnosis not present

## 2020-10-16 DIAGNOSIS — M25562 Pain in left knee: Secondary | ICD-10-CM | POA: Diagnosis not present

## 2020-10-18 DIAGNOSIS — Z9849 Cataract extraction status, unspecified eye: Secondary | ICD-10-CM | POA: Diagnosis not present

## 2020-10-19 DIAGNOSIS — M25562 Pain in left knee: Secondary | ICD-10-CM | POA: Diagnosis not present

## 2020-10-22 DIAGNOSIS — M25562 Pain in left knee: Secondary | ICD-10-CM | POA: Diagnosis not present

## 2020-10-24 DIAGNOSIS — M25562 Pain in left knee: Secondary | ICD-10-CM | POA: Diagnosis not present

## 2020-10-26 DIAGNOSIS — M25562 Pain in left knee: Secondary | ICD-10-CM | POA: Diagnosis not present

## 2020-10-29 DIAGNOSIS — M25562 Pain in left knee: Secondary | ICD-10-CM | POA: Diagnosis not present

## 2020-11-02 DIAGNOSIS — M25562 Pain in left knee: Secondary | ICD-10-CM | POA: Diagnosis not present

## 2020-11-26 DIAGNOSIS — I5032 Chronic diastolic (congestive) heart failure: Secondary | ICD-10-CM | POA: Diagnosis not present

## 2020-11-26 DIAGNOSIS — N1832 Chronic kidney disease, stage 3b: Secondary | ICD-10-CM | POA: Diagnosis not present

## 2020-11-26 DIAGNOSIS — I129 Hypertensive chronic kidney disease with stage 1 through stage 4 chronic kidney disease, or unspecified chronic kidney disease: Secondary | ICD-10-CM | POA: Diagnosis not present

## 2020-11-26 DIAGNOSIS — D631 Anemia in chronic kidney disease: Secondary | ICD-10-CM | POA: Diagnosis not present

## 2020-11-26 DIAGNOSIS — R7303 Prediabetes: Secondary | ICD-10-CM | POA: Diagnosis not present

## 2021-05-10 DIAGNOSIS — I340 Nonrheumatic mitral (valve) insufficiency: Secondary | ICD-10-CM | POA: Diagnosis not present

## 2021-05-10 DIAGNOSIS — G894 Chronic pain syndrome: Secondary | ICD-10-CM | POA: Diagnosis not present

## 2021-05-10 DIAGNOSIS — N1832 Chronic kidney disease, stage 3b: Secondary | ICD-10-CM | POA: Diagnosis not present

## 2021-05-10 DIAGNOSIS — Z23 Encounter for immunization: Secondary | ICD-10-CM | POA: Diagnosis not present

## 2021-05-10 DIAGNOSIS — I351 Nonrheumatic aortic (valve) insufficiency: Secondary | ICD-10-CM | POA: Diagnosis not present

## 2021-05-10 DIAGNOSIS — I10 Essential (primary) hypertension: Secondary | ICD-10-CM | POA: Diagnosis not present

## 2021-05-10 DIAGNOSIS — D692 Other nonthrombocytopenic purpura: Secondary | ICD-10-CM | POA: Diagnosis not present

## 2021-05-10 DIAGNOSIS — I77810 Thoracic aortic ectasia: Secondary | ICD-10-CM | POA: Diagnosis not present

## 2021-08-30 DIAGNOSIS — R011 Cardiac murmur, unspecified: Secondary | ICD-10-CM | POA: Diagnosis not present

## 2021-08-30 DIAGNOSIS — G894 Chronic pain syndrome: Secondary | ICD-10-CM | POA: Diagnosis not present

## 2021-08-30 DIAGNOSIS — I77810 Thoracic aortic ectasia: Secondary | ICD-10-CM | POA: Diagnosis not present

## 2021-08-30 DIAGNOSIS — Z0000 Encounter for general adult medical examination without abnormal findings: Secondary | ICD-10-CM | POA: Diagnosis not present

## 2021-08-30 DIAGNOSIS — I351 Nonrheumatic aortic (valve) insufficiency: Secondary | ICD-10-CM | POA: Diagnosis not present

## 2021-08-30 DIAGNOSIS — I340 Nonrheumatic mitral (valve) insufficiency: Secondary | ICD-10-CM | POA: Diagnosis not present

## 2021-08-30 DIAGNOSIS — Z79899 Other long term (current) drug therapy: Secondary | ICD-10-CM | POA: Diagnosis not present

## 2021-08-30 DIAGNOSIS — I10 Essential (primary) hypertension: Secondary | ICD-10-CM | POA: Diagnosis not present

## 2021-08-30 DIAGNOSIS — E038 Other specified hypothyroidism: Secondary | ICD-10-CM | POA: Diagnosis not present

## 2021-08-30 DIAGNOSIS — N1832 Chronic kidney disease, stage 3b: Secondary | ICD-10-CM | POA: Diagnosis not present

## 2021-10-15 DIAGNOSIS — D631 Anemia in chronic kidney disease: Secondary | ICD-10-CM | POA: Diagnosis not present

## 2021-10-15 DIAGNOSIS — N184 Chronic kidney disease, stage 4 (severe): Secondary | ICD-10-CM | POA: Diagnosis not present

## 2021-10-15 DIAGNOSIS — R7303 Prediabetes: Secondary | ICD-10-CM | POA: Diagnosis not present

## 2021-10-15 DIAGNOSIS — I129 Hypertensive chronic kidney disease with stage 1 through stage 4 chronic kidney disease, or unspecified chronic kidney disease: Secondary | ICD-10-CM | POA: Diagnosis not present

## 2021-10-15 DIAGNOSIS — I5032 Chronic diastolic (congestive) heart failure: Secondary | ICD-10-CM | POA: Diagnosis not present

## 2021-10-15 DIAGNOSIS — N1832 Chronic kidney disease, stage 3b: Secondary | ICD-10-CM | POA: Diagnosis not present

## 2021-12-29 ENCOUNTER — Inpatient Hospital Stay (HOSPITAL_COMMUNITY): Payer: Medicare Other

## 2021-12-29 ENCOUNTER — Emergency Department (HOSPITAL_COMMUNITY): Payer: Medicare Other

## 2021-12-29 ENCOUNTER — Encounter (HOSPITAL_COMMUNITY): Payer: Self-pay | Admitting: Emergency Medicine

## 2021-12-29 ENCOUNTER — Inpatient Hospital Stay (HOSPITAL_COMMUNITY)
Admission: EM | Admit: 2021-12-29 | Discharge: 2022-01-02 | DRG: 038 | Disposition: A | Discharge status: Rehabilitation Facility | Payer: Medicare Other | Attending: Internal Medicine | Admitting: Internal Medicine

## 2021-12-29 ENCOUNTER — Other Ambulatory Visit: Payer: Self-pay

## 2021-12-29 DIAGNOSIS — S5002XA Contusion of left elbow, initial encounter: Secondary | ICD-10-CM | POA: Diagnosis present

## 2021-12-29 DIAGNOSIS — R29810 Facial weakness: Secondary | ICD-10-CM | POA: Diagnosis not present

## 2021-12-29 DIAGNOSIS — I130 Hypertensive heart and chronic kidney disease with heart failure and stage 1 through stage 4 chronic kidney disease, or unspecified chronic kidney disease: Secondary | ICD-10-CM | POA: Diagnosis not present

## 2021-12-29 DIAGNOSIS — E785 Hyperlipidemia, unspecified: Secondary | ICD-10-CM | POA: Diagnosis not present

## 2021-12-29 DIAGNOSIS — R471 Dysarthria and anarthria: Secondary | ICD-10-CM | POA: Diagnosis not present

## 2021-12-29 DIAGNOSIS — E039 Hypothyroidism, unspecified: Secondary | ICD-10-CM | POA: Diagnosis present

## 2021-12-29 DIAGNOSIS — I6521 Occlusion and stenosis of right carotid artery: Secondary | ICD-10-CM | POA: Diagnosis present

## 2021-12-29 DIAGNOSIS — S60222A Contusion of left hand, initial encounter: Secondary | ICD-10-CM | POA: Diagnosis present

## 2021-12-29 DIAGNOSIS — W06XXXA Fall from bed, initial encounter: Secondary | ICD-10-CM | POA: Diagnosis present

## 2021-12-29 DIAGNOSIS — Z96653 Presence of artificial knee joint, bilateral: Secondary | ICD-10-CM | POA: Diagnosis present

## 2021-12-29 DIAGNOSIS — I10 Essential (primary) hypertension: Secondary | ICD-10-CM | POA: Diagnosis present

## 2021-12-29 DIAGNOSIS — Z79899 Other long term (current) drug therapy: Secondary | ICD-10-CM

## 2021-12-29 DIAGNOSIS — J3489 Other specified disorders of nose and nasal sinuses: Secondary | ICD-10-CM | POA: Diagnosis not present

## 2021-12-29 DIAGNOSIS — N1831 Chronic kidney disease, stage 3a: Secondary | ICD-10-CM | POA: Diagnosis present

## 2021-12-29 DIAGNOSIS — H9190 Unspecified hearing loss, unspecified ear: Secondary | ICD-10-CM | POA: Diagnosis present

## 2021-12-29 DIAGNOSIS — M25522 Pain in left elbow: Secondary | ICD-10-CM | POA: Diagnosis not present

## 2021-12-29 DIAGNOSIS — M19042 Primary osteoarthritis, left hand: Secondary | ICD-10-CM | POA: Diagnosis not present

## 2021-12-29 DIAGNOSIS — R451 Restlessness and agitation: Secondary | ICD-10-CM | POA: Diagnosis not present

## 2021-12-29 DIAGNOSIS — G894 Chronic pain syndrome: Secondary | ICD-10-CM | POA: Diagnosis not present

## 2021-12-29 DIAGNOSIS — I63411 Cerebral infarction due to embolism of right middle cerebral artery: Secondary | ICD-10-CM | POA: Diagnosis not present

## 2021-12-29 DIAGNOSIS — S199XXA Unspecified injury of neck, initial encounter: Secondary | ICD-10-CM | POA: Diagnosis not present

## 2021-12-29 DIAGNOSIS — I6389 Other cerebral infarction: Secondary | ICD-10-CM | POA: Diagnosis not present

## 2021-12-29 DIAGNOSIS — E871 Hypo-osmolality and hyponatremia: Secondary | ICD-10-CM | POA: Diagnosis not present

## 2021-12-29 DIAGNOSIS — I639 Cerebral infarction, unspecified: Secondary | ICD-10-CM | POA: Diagnosis not present

## 2021-12-29 DIAGNOSIS — D649 Anemia, unspecified: Secondary | ICD-10-CM | POA: Diagnosis present

## 2021-12-29 DIAGNOSIS — N400 Enlarged prostate without lower urinary tract symptoms: Secondary | ICD-10-CM | POA: Diagnosis present

## 2021-12-29 DIAGNOSIS — M47814 Spondylosis without myelopathy or radiculopathy, thoracic region: Secondary | ICD-10-CM | POA: Diagnosis not present

## 2021-12-29 DIAGNOSIS — R29711 NIHSS score 11: Secondary | ICD-10-CM | POA: Diagnosis present

## 2021-12-29 DIAGNOSIS — Y92013 Bedroom of single-family (private) house as the place of occurrence of the external cause: Secondary | ICD-10-CM | POA: Diagnosis not present

## 2021-12-29 DIAGNOSIS — J9601 Acute respiratory failure with hypoxia: Secondary | ICD-10-CM | POA: Diagnosis not present

## 2021-12-29 DIAGNOSIS — E870 Hyperosmolality and hypernatremia: Secondary | ICD-10-CM | POA: Diagnosis not present

## 2021-12-29 DIAGNOSIS — G8194 Hemiplegia, unspecified affecting left nondominant side: Secondary | ICD-10-CM | POA: Diagnosis not present

## 2021-12-29 DIAGNOSIS — N183 Chronic kidney disease, stage 3 unspecified: Secondary | ICD-10-CM | POA: Diagnosis present

## 2021-12-29 DIAGNOSIS — N1832 Chronic kidney disease, stage 3b: Secondary | ICD-10-CM

## 2021-12-29 DIAGNOSIS — I672 Cerebral atherosclerosis: Secondary | ICD-10-CM | POA: Diagnosis not present

## 2021-12-29 DIAGNOSIS — I63231 Cerebral infarction due to unspecified occlusion or stenosis of right carotid arteries: Secondary | ICD-10-CM | POA: Diagnosis not present

## 2021-12-29 DIAGNOSIS — Z8673 Personal history of transient ischemic attack (TIA), and cerebral infarction without residual deficits: Secondary | ICD-10-CM | POA: Diagnosis not present

## 2021-12-29 DIAGNOSIS — Z538 Procedure and treatment not carried out for other reasons: Secondary | ICD-10-CM | POA: Diagnosis not present

## 2021-12-29 DIAGNOSIS — N189 Chronic kidney disease, unspecified: Secondary | ICD-10-CM | POA: Diagnosis not present

## 2021-12-29 DIAGNOSIS — G9389 Other specified disorders of brain: Secondary | ICD-10-CM | POA: Diagnosis not present

## 2021-12-29 DIAGNOSIS — J690 Pneumonitis due to inhalation of food and vomit: Secondary | ICD-10-CM | POA: Diagnosis not present

## 2021-12-29 DIAGNOSIS — I63233 Cerebral infarction due to unspecified occlusion or stenosis of bilateral carotid arteries: Secondary | ICD-10-CM | POA: Diagnosis not present

## 2021-12-29 DIAGNOSIS — I69354 Hemiplegia and hemiparesis following cerebral infarction affecting left non-dominant side: Secondary | ICD-10-CM | POA: Diagnosis not present

## 2021-12-29 DIAGNOSIS — I69391 Dysphagia following cerebral infarction: Secondary | ICD-10-CM | POA: Diagnosis not present

## 2021-12-29 DIAGNOSIS — K5900 Constipation, unspecified: Secondary | ICD-10-CM | POA: Diagnosis not present

## 2021-12-29 DIAGNOSIS — D631 Anemia in chronic kidney disease: Secondary | ICD-10-CM | POA: Diagnosis not present

## 2021-12-29 DIAGNOSIS — I6381 Other cerebral infarction due to occlusion or stenosis of small artery: Secondary | ICD-10-CM | POA: Diagnosis not present

## 2021-12-29 DIAGNOSIS — I63311 Cerebral infarction due to thrombosis of right middle cerebral artery: Secondary | ICD-10-CM | POA: Diagnosis not present

## 2021-12-29 DIAGNOSIS — I5032 Chronic diastolic (congestive) heart failure: Secondary | ICD-10-CM | POA: Diagnosis present

## 2021-12-29 DIAGNOSIS — I63511 Cerebral infarction due to unspecified occlusion or stenosis of right middle cerebral artery: Secondary | ICD-10-CM | POA: Diagnosis not present

## 2021-12-29 DIAGNOSIS — R9431 Abnormal electrocardiogram [ECG] [EKG]: Secondary | ICD-10-CM | POA: Diagnosis not present

## 2021-12-29 DIAGNOSIS — S299XXA Unspecified injury of thorax, initial encounter: Secondary | ICD-10-CM | POA: Diagnosis not present

## 2021-12-29 DIAGNOSIS — I517 Cardiomegaly: Secondary | ICD-10-CM | POA: Diagnosis not present

## 2021-12-29 DIAGNOSIS — E1122 Type 2 diabetes mellitus with diabetic chronic kidney disease: Secondary | ICD-10-CM | POA: Diagnosis not present

## 2021-12-29 DIAGNOSIS — I129 Hypertensive chronic kidney disease with stage 1 through stage 4 chronic kidney disease, or unspecified chronic kidney disease: Secondary | ICD-10-CM | POA: Diagnosis not present

## 2021-12-29 HISTORY — DX: Cerebral infarction, unspecified: I63.9

## 2021-12-29 LAB — URINALYSIS, COMPLETE (UACMP) WITH MICROSCOPIC
Bacteria, UA: NONE SEEN
Bilirubin Urine: NEGATIVE
Glucose, UA: NEGATIVE mg/dL
Ketones, ur: NEGATIVE mg/dL
Leukocytes,Ua: NEGATIVE
Nitrite: NEGATIVE
Protein, ur: 30 mg/dL — AB
Specific Gravity, Urine: 1.011 (ref 1.005–1.030)
pH: 5 (ref 5.0–8.0)

## 2021-12-29 LAB — I-STAT CHEM 8, ED
BUN: 36 mg/dL — ABNORMAL HIGH (ref 8–23)
Calcium, Ion: 1.24 mmol/L (ref 1.15–1.40)
Chloride: 109 mmol/L (ref 98–111)
Creatinine, Ser: 1.4 mg/dL — ABNORMAL HIGH (ref 0.61–1.24)
Glucose, Bld: 118 mg/dL — ABNORMAL HIGH (ref 70–99)
HCT: 34 % — ABNORMAL LOW (ref 39.0–52.0)
Hemoglobin: 11.6 g/dL — ABNORMAL LOW (ref 13.0–17.0)
Potassium: 4.4 mmol/L (ref 3.5–5.1)
Sodium: 141 mmol/L (ref 135–145)
TCO2: 24 mmol/L (ref 22–32)

## 2021-12-29 LAB — DIFFERENTIAL
Abs Immature Granulocytes: 0.01 10*3/uL (ref 0.00–0.07)
Basophils Absolute: 0 10*3/uL (ref 0.0–0.1)
Basophils Relative: 0 %
Eosinophils Absolute: 0 10*3/uL (ref 0.0–0.5)
Eosinophils Relative: 0 %
Immature Granulocytes: 0 %
Lymphocytes Relative: 18 %
Lymphs Abs: 1.2 10*3/uL (ref 0.7–4.0)
Monocytes Absolute: 1.9 10*3/uL — ABNORMAL HIGH (ref 0.1–1.0)
Monocytes Relative: 27 %
Neutro Abs: 3.8 10*3/uL (ref 1.7–7.7)
Neutrophils Relative %: 55 %

## 2021-12-29 LAB — COMPREHENSIVE METABOLIC PANEL
ALT: 12 U/L (ref 0–44)
AST: 25 U/L (ref 15–41)
Albumin: 3.4 g/dL — ABNORMAL LOW (ref 3.5–5.0)
Alkaline Phosphatase: 56 U/L (ref 38–126)
Anion gap: 10 (ref 5–15)
BUN: 35 mg/dL — ABNORMAL HIGH (ref 8–23)
CO2: 22 mmol/L (ref 22–32)
Calcium: 9.9 mg/dL (ref 8.9–10.3)
Chloride: 108 mmol/L (ref 98–111)
Creatinine, Ser: 1.46 mg/dL — ABNORMAL HIGH (ref 0.61–1.24)
GFR, Estimated: 46 mL/min — ABNORMAL LOW (ref >60)
Glucose, Bld: 119 mg/dL — ABNORMAL HIGH (ref 70–99)
Potassium: 4.4 mmol/L (ref 3.5–5.1)
Sodium: 140 mmol/L (ref 135–145)
Total Bilirubin: 0.8 mg/dL (ref 0.3–1.2)
Total Protein: 7.6 g/dL (ref 6.5–8.1)

## 2021-12-29 LAB — PROTIME-INR
INR: 1.4 — ABNORMAL HIGH (ref 0.8–1.2)
Prothrombin Time: 16.7 seconds — ABNORMAL HIGH (ref 11.4–15.2)

## 2021-12-29 LAB — CBC
HCT: 35.6 % — ABNORMAL LOW (ref 39.0–52.0)
Hemoglobin: 11.6 g/dL — ABNORMAL LOW (ref 13.0–17.0)
MCH: 28.1 pg (ref 26.0–34.0)
MCHC: 32.6 g/dL (ref 30.0–36.0)
MCV: 86.2 fL (ref 80.0–100.0)
Platelets: 197 10*3/uL (ref 150–400)
RBC: 4.13 MIL/uL — ABNORMAL LOW (ref 4.22–5.81)
RDW: 16.2 % — ABNORMAL HIGH (ref 11.5–15.5)
WBC: 6.9 10*3/uL (ref 4.0–10.5)
nRBC: 0 % (ref 0.0–0.2)

## 2021-12-29 LAB — FERRITIN: Ferritin: 78 ng/mL (ref 24–336)

## 2021-12-29 LAB — IRON AND TIBC
Iron: 53 ug/dL (ref 45–182)
Saturation Ratios: 18 % (ref 17.9–39.5)
TIBC: 294 ug/dL (ref 250–450)
UIBC: 241 ug/dL

## 2021-12-29 LAB — APTT: aPTT: 38 seconds — ABNORMAL HIGH (ref 24–36)

## 2021-12-29 LAB — RETICULOCYTES
Immature Retic Fract: 16.3 % — ABNORMAL HIGH (ref 2.3–15.9)
RBC.: 3.88 MIL/uL — ABNORMAL LOW (ref 4.22–5.81)
Retic Count, Absolute: 59.8 10*3/uL (ref 19.0–186.0)
Retic Ct Pct: 1.5 % (ref 0.4–3.1)

## 2021-12-29 LAB — CBG MONITORING, ED: Glucose-Capillary: 112 mg/dL — ABNORMAL HIGH (ref 70–99)

## 2021-12-29 LAB — FOLATE: Folate: 29 ng/mL (ref >5.9)

## 2021-12-29 LAB — MAGNESIUM: Magnesium: 1.9 mg/dL (ref 1.7–2.4)

## 2021-12-29 LAB — LACTIC ACID, PLASMA: Lactic Acid, Venous: 1.3 mmol/L (ref 0.5–1.9)

## 2021-12-29 LAB — CK: Total CK: 328 U/L (ref 49–397)

## 2021-12-29 LAB — TSH: TSH: 2.903 u[IU]/mL (ref 0.350–4.500)

## 2021-12-29 LAB — TROPONIN I (HIGH SENSITIVITY): Troponin I (High Sensitivity): 76 ng/L — ABNORMAL HIGH (ref <18)

## 2021-12-29 LAB — PHOSPHORUS: Phosphorus: 3.5 mg/dL (ref 2.5–4.6)

## 2021-12-29 LAB — VITAMIN B12: Vitamin B-12: 359 pg/mL (ref 180–914)

## 2021-12-29 MED ORDER — PRAVASTATIN SODIUM 40 MG PO TABS: 40.0000 mg | ORAL_TABLET | Freq: Every day | ORAL | Status: DC

## 2021-12-29 MED ORDER — ACETAMINOPHEN 160 MG/5ML PO SOLN: 650.0000 mg | ORAL | Status: DC | PRN

## 2021-12-29 MED ORDER — STROKE: EARLY STAGES OF RECOVERY BOOK
Freq: Once | Status: AC
Start: 2021-12-29 — End: 2021-12-30

## 2021-12-29 MED ORDER — SODIUM CHLORIDE 0.9% FLUSH
3.0000 mL | Freq: Once | INTRAVENOUS | Status: AC
  Administered 2021-12-29: 3 mL via INTRAVENOUS

## 2021-12-29 MED ORDER — FENTANYL CITRATE PF 50 MCG/ML IJ SOSY
25.0000 ug | PREFILLED_SYRINGE | INTRAMUSCULAR | Status: DC | PRN
  Filled 2021-12-29: qty 1

## 2021-12-29 MED ORDER — ACETAMINOPHEN 325 MG PO TABS
650.0000 mg | ORAL_TABLET | ORAL | Status: DC | PRN
  Administered 2021-12-31: 650 mg via ORAL
  Filled 2021-12-29: qty 2

## 2021-12-29 MED ORDER — SODIUM CHLORIDE 0.9 % IV SOLN: INTRAVENOUS | Status: DC

## 2021-12-29 MED ORDER — ASPIRIN 325 MG PO TABS
325.0000 mg | ORAL_TABLET | Freq: Every day | ORAL | Status: DC
  Filled 2021-12-29: qty 1

## 2021-12-29 MED ORDER — ASPIRIN 300 MG RE SUPP
300.0000 mg | Freq: Every day | RECTAL | Status: DC
  Administered 2021-12-29: 300 mg via RECTAL
  Filled 2021-12-29: qty 1

## 2021-12-29 MED ORDER — HYDROCODONE-ACETAMINOPHEN 7.5-325 MG PO TABS
1.0000 | ORAL_TABLET | Freq: Three times a day (TID) | ORAL | Status: DC | PRN
  Administered 2022-01-01: 1 via ORAL
  Filled 2021-12-29: qty 1

## 2021-12-29 MED ORDER — TAMSULOSIN HCL 0.4 MG PO CAPS
0.4000 mg | ORAL_CAPSULE | Freq: Every day | ORAL | Status: DC
  Administered 2021-12-30 – 2021-12-31 (×2): 0.4 mg via ORAL
  Filled 2021-12-29 (×3): qty 1

## 2021-12-29 MED ORDER — ACETAMINOPHEN 650 MG RE SUPP
650.0000 mg | RECTAL | Status: DC | PRN
  Administered 2021-12-29: 650 mg via RECTAL
  Filled 2021-12-29: qty 1

## 2021-12-29 NOTE — ED Notes (Signed)
X-ray at bedside

## 2021-12-29 NOTE — Subjective & Objective (Signed)
Presents with a fall last night he has been on the floor all night This AM noted to be leanig to the left w L side facila droopl, slurred speech, flaccid left arm LKW 3 pm yesterday

## 2021-12-29 NOTE — ED Notes (Signed)
Per MD [place]

## 2021-12-29 NOTE — Assessment & Plan Note (Signed)
-  chronic avoid nephrotoxic medications such as NSAIDs, Vanco Zosyn combo,  avoid hypotension, continue to follow renal function  

## 2021-12-29 NOTE — H&P (Signed)
Chad [name] [id number] DOB: Feb 01, 1932 DOA: 12/29/2021     PCP: [name], MD   Outpatient Specialists:    [facility]:  [facility]    Patient arrived to ER on 12/29/21 at 1637 Referred by Attending Sherwood Gambler, MD   Patient coming from:    home Lives alone,        Chief Complaint:   Chief Complaint  Patient presents with   Fall   Stroke Symptoms    HPI: Chad [name] is a 88 y.o. male with medical history significant of hypothyroidism chronic pain syndromes, CKD, HTN, arthritis, sp 2 knee replacemnts    Presented with   fall out of bed Left side weakness Presents with a fall last night he has been on the floor all night This AM noted to be leanig to the left w L side facila droopl, slurred speech, flaccid left arm LKW 3 pm yesterday      Regarding pertinent Chronic problems:     Hyperlipidemia -  on statins lovastatin Lipid Panel  No results found for: CHOL, TRIG, HDL, CHOLHDL, VLDL, LDLCALC, LDLDIRECT, LABVLDL   HTN on atenolol, amlodipine   chronic CHF diastolic - last echo 2952    CKD stage IIIb- baseline Cr 1.4 CrCl cannot be calculated (Unknown ideal weight.).  Lab Results  Component Value Date   CREATININE 1.40 (H) 12/29/2021   CREATININE 1.46 (H) 12/29/2021   CREATININE 1.26 (H) 10/10/2020      BPH - on Flomax,         Chronic anemia - baseline hg Hemoglobin & Hematocrit  Recent Labs    12/29/21 1700 12/29/21 1722  HGB 11.6* 11.6*     While in ER:   Spoke with Neuro    Ordered  CT HEAD 1. Age indeterminate lacunar infarct within the right basal ganglia. 2. Age-indeterminate small-vessel ischemic changes throughout the periventricular white matter, favor chronic. 3. Chronic left basal ganglia and right frontal white matter lacunar infarcts as above. 4. No acute hemorrhage.  CT neck - Extensive multilevel cervical degenerative changes. No acute Fracture  ELBOw left - no frx CXR - mild  cardiomegaly    Following Medications were ordered in ER: Medications  sodium chloride flush (NS) 0.9 % injection 3 mL (3 mLs Intravenous Given 12/29/21 1717)    _______________________________________________________ ER Provider Called: Neurology   Dr.  Curly Shores     ED Triage Vitals  Enc Vitals Group     BP 12/29/21 1646 (!) 152/78     Pulse Rate 12/29/21 1646 62     Resp 12/29/21 1646 16     Temp 12/29/21 1646 97.8 F (36.6 C)     Temp Source 12/29/21 1646 Oral     SpO2 12/29/21 1646 98 %     Weight --      Height --      Head Circumference --      Peak Flow --      Pain Score 12/29/21 1654 0     Pain Loc --      Pain Edu? --      Excl. in [place]? --   TMAX(24)@     _________________________________________ Significant initial  Findings: Abnormal Labs Reviewed  PROTIME-INR - Abnormal; Notable for the following components:      Result Value   Prothrombin Time 16.7 (*)    INR 1.4 (*)    All other components within normal limits  APTT - Abnormal; Notable for the following components:  aPTT 38 (*)    All other components within normal limits  CBC - Abnormal; Notable for the following components:   RBC 4.13 (*)    Hemoglobin 11.6 (*)    HCT 35.6 (*)    RDW 16.2 (*)    All other components within normal limits  DIFFERENTIAL - Abnormal; Notable for the following components:   Monocytes Absolute 1.9 (*)    All other components within normal limits  COMPREHENSIVE METABOLIC PANEL - Abnormal; Notable for the following components:   Glucose, Bld 119 (*)    BUN 35 (*)    Creatinine, Ser 1.46 (*)    Albumin 3.4 (*)    GFR, Estimated 46 (*)    All other components within normal limits  I-STAT CHEM 8, ED - Abnormal; Notable for the following components:   BUN 36 (*)    Creatinine, Ser 1.40 (*)    Glucose, Bld 118 (*)    Hemoglobin 11.6 (*)    HCT 34.0 (*)    All other components within normal limits  CBG MONITORING, ED - Abnormal; Notable for the following components:    Glucose-Capillary 112 (*)    All other components within normal limits       ECG: Ordered Personally reviewed by me showing: HR : 60 Rhythm:Sinus rhythm with 1st degree A-V block Left axis deviation Anterolateral infarct , age undetermined Interpretation limited secondary to artifact QTC 460  The recent clinical data is shown below. Vitals:   12/29/21 1646 12/29/21 1715 12/29/21 1730  BP: (!) 152/78 (!) 158/82 (!) 142/66  Pulse: 62 68 61  Resp: 16 20 14   Temp: 97.8 F (36.6 C)    TempSrc: Oral    SpO2: 98% 100% 99%   WBC     Component Value Date/Time   WBC 6.9 12/29/2021 1700   LYMPHSABS 1.2 12/29/2021 1700   MONOABS 1.9 (H) 12/29/2021 1700   EOSABS 0.0 12/29/2021 1700   BASOSABS 0.0 12/29/2021 1700     Lactic Acid, Venous No results found for: LATICACIDVEN     UA ordered     _______________________________________________ Hospitalist was called for admission for CVA   The following Work up has been ordered so far:  Orders Placed This Encounter  Procedures   CT HEAD WO CONTRAST   CT Cervical Spine Wo Contrast   DG Hand Complete Left   DG Elbow Complete Left   Protime-INR   APTT   CBC   Differential   Comprehensive metabolic panel   Urinalysis, Routine w reflex microscopic   CK   Diet NPO time specified   Cardiac monitoring   Swallow screen   NIH Stroke Scale   Modified Stroke Scale (mNIHSS) Document mNIHSS assessment every 2 hours for a total of 12 hours   Saline Lock IV, Maintain IV access   If O2 sat   Consult to hospitalist   Pulse oximetry, continuous   I-stat chem 8, ED   CBG monitoring, ED   EKG 12-Lead   ED EKG     OTHER Significant initial  Findings:  labs showing:    Recent Labs  Lab 12/29/21 1700 12/29/21 1722  NA 140 141  K 4.4 4.4  CO2 22  --   GLUCOSE 119* 118*  BUN 35* 36*  CREATININE 1.46* 1.40*  CALCIUM 9.9  --     Cr  stable,   Lab Results  Component Value Date   CREATININE 1.40 (H) 12/29/2021    CREATININE 1.46 (H) 12/29/2021  CREATININE 1.26 (H) 10/10/2020    Recent Labs  Lab 12/29/21 1700  AST 25  ALT 12  ALKPHOS 56  BILITOT 0.8  PROT 7.6  ALBUMIN 3.4*   Lab Results  Component Value Date   CALCIUM 9.9 12/29/2021          Plt: Lab Results  Component Value Date   PLT 197 12/29/2021     Recent Labs  Lab 12/29/21 1700 12/29/21 1722  WBC 6.9  --   NEUTROABS 3.8  --   HGB 11.6* 11.6*  HCT 35.6* 34.0*  MCV 86.2  --   PLT 197  --     HG/HCT   stable,      Component Value Date/Time   HGB 11.6 (L) 12/29/2021 1722   HCT 34.0 (L) 12/29/2021 1722   MCV 86.2 12/29/2021 1700     No results for input(s): LIPASE, AMYLASE in the last 168 hours. No results for input(s): AMMONIA in the last 168 hours.    Cardiac Panel (last 3 results) Recent Labs    12/29/21 1700  CKTOTAL 328    DM  labs:  HbA1C: No results for input(s): HGBA1C in the last 8760 hours.     CBG (last 3)  Recent Labs    12/29/21 1658  GLUCAP 112*           Cultures: No results found for: SDES, SPECREQUEST, CULT, REPTSTATUS   Radiological Exams on Admission: DG Elbow Complete Left  Result Date: 12/29/2021 CLINICAL DATA:  Fall.  Left elbow pain and bruising. EXAM: LEFT ELBOW - COMPLETE 3+ VIEW COMPARISON:  None Available. FINDINGS: No fracture or dislocation. Narrowed radiocapitellar joint. Marginal osteophytes project from the base of the radial head. No joint effusion. There is soft tissue edema posteriorly. IMPRESSION: No fracture or dislocation. Electronically Signed   By: Lajean Manes M.D.   On: 12/29/2021 18:12   CT HEAD WO CONTRAST  Result Date: 12/29/2021 CLINICAL DATA:  Neurologic deficit EXAM: CT HEAD WITHOUT CONTRAST TECHNIQUE: Contiguous axial images were obtained from the base of the skull through the vertex without intravenous contrast. RADIATION DOSE REDUCTION: This exam was performed according to the departmental dose-optimization program which includes automated  exposure control, adjustment of the mA and/or kV according to patient size and/or use of iterative reconstruction technique. COMPARISON:  None Available. FINDINGS: Brain: Confluent hypodensities throughout the periventricular white matter are most consistent with age-indeterminate small vessel ischemic changes, likely chronic. Focal hypodensities in the left basal ganglia and right frontal periventricular white matter consistent with chronic lacunar infarcts. Age-indeterminate lacunar infarct is seen within the right basal ganglia image 18/3. No other signs of acute infarct or hemorrhage. Lateral ventricles and midline structures are otherwise unremarkable. No acute extra-axial fluid collections. No mass effect. Vascular: No hyperdense vessel or unexpected calcification. Skull: Normal. Negative for fracture or focal lesion. Sinuses/Orbits: No acute finding. Other: None. IMPRESSION: 1. Age indeterminate lacunar infarct within the right basal ganglia. 2. Age-indeterminate small-vessel ischemic changes throughout the periventricular white matter, favor chronic. 3. Chronic left basal ganglia and right frontal white matter lacunar infarcts as above. 4. No acute hemorrhage. Electronically Signed   By: Randa Ngo M.D.   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 Multiplanar CT image reconstructions were also generated. RADIATION DOSE REDUCTION: This  exam was performed according to the departmental dose-optimization program which includes automated exposure control, adjustment of the mA and/or kV according to patient size and/or use of iterative reconstruction technique. COMPARISON:  None Available. FINDINGS: Alignment: Mild degenerative anterolisthesis of C2 and C3. Otherwise alignment is  anatomic. Skull base and vertebrae: No acute fracture. No primary bone lesion or focal pathologic process. Soft tissues and spinal canal: No prevertebral fluid or swelling. No visible canal hematoma. Disc levels: Partial bony fusion across the disc spaces at C3-4 and C4-5. There is severe multilevel spondylosis throughout the remainder of the cervical spine, greatest at C5-6 and C6-7. Diffuse facet hypertrophy. Upper chest: Airway is patent. Lung apices are clear. Prominent atherosclerosis of the aortic arch. Other: Reconstructed images demonstrate no additional findings. IMPRESSION: 1. Extensive multilevel cervical degenerative changes. No acute fracture. Electronically Signed   By: Randa Ngo M.D.   On: 12/29/2021 17:38   DG Hand Complete Left  Result Date: 12/29/2021 CLINICAL DATA:  Fall.  Bruising to posterior left hand. EXAM: LEFT HAND - COMPLETE 3+ VIEW COMPARISON:  None Available. FINDINGS: There is diffuse decreased bone mineralization. A pulse oximeter overlies the distal index finger obscuring portions. Degenerative changes including joint space narrowing, subchondral sclerosis and peripheral osteophytosis are moderate at the triscaphe joint; moderate to severe at the thumb carpometacarpal, thumb metacarpophalangeal, and thumb interphalangeal joints; and moderate to severe at the DIP joints and moderate at the PIP joints of the second through fifth fingers. No acute fracture is seen. No dislocation. IMPRESSION: Osteoarthritis as above. No acute fracture. Electronically Signed   By: Yvonne Kendall M.D.   On: 12/29/2021 18:12   _______________________________________________________________________________________________________ Latest   Blood pressure (!) 142/66, pulse 61, temperature 97.8 F (36.6 C), temperature source Oral, resp. rate 14, SpO2 99 %.   Vitals  labs and radiology finding personally reviewed  Review of Systems:    Pertinent positives include: fall, no gait abnormality,  localizing neurological complaints,  Constitutional:  No weight loss, night sweats, Fevers, chills, fatigue, weight loss  HEENT:  No headaches, Difficulty swallowing,Tooth/dental problems,Sore throat,  No sneezing, itching, ear ache, nasal congestion, post nasal drip,  Cardio-vascular:  No chest pain, Orthopnea, PND, anasarca, dizziness, palpitations.no Bilateral lower extremity swelling  GI:  No heartburn, indigestion, abdominal pain, nausea, vomiting, diarrhea, change in bowel habits, loss of appetite, melena, blood in stool, hematemesis Resp:  no shortness of breath at rest. No dyspnea on exertion, No excess mucus, no productive cough, No non-productive cough, No coughing up of blood.No change in color of mucus.No wheezing. Skin:  no rash or lesions. No jaundice GU:  no dysuria, change in color of urine, no urgency or frequency. No straining to urinate.  No flank pain.  Musculoskeletal:  No joint pain or no joint swelling. No decreased range of motion. No back pain.  Psych:  No change in mood or affect. No depression or anxiety. No memory loss.  Neuro: no  no tingling, no weakness, no double vision, , no slurred speech, no confusion  All systems reviewed and apart from [place] all are negative _______________________________________________________________________________________________ Past Medical History:   Past Medical History:  Diagnosis Date   Arthritis    Heart murmur    asa child    Hypertension      Past Surgical History:  Procedure Laterality Date   EYE SURGERY Bilateral 2022   TONSILLECTOMY     TOTAL KNEE ARTHROPLASTY Right 03/30/2013   Procedure: RIGHT TOTAL KNEE ARTHROPLASTY;  Surgeon: Kipp Brood  Gioffre, MD;  Location: [facility];  Service: Orthopedics;  Laterality: Right;   TOTAL KNEE ARTHROPLASTY Left 10/09/2020   Procedure: TOTAL KNEE ARTHROPLASTY;  Surgeon: Paralee Cancel, MD;  Location: [facility];  Service: Orthopedics;  Laterality: Left;  70 mins    Social  History:  Ambulatory   cane,      reports that he has never smoked. He has never used smokeless tobacco. He reports that he does not drink alcohol and does not use drugs.     Family History: no contributory ho fam history of CVA   ______________________________________________________________________________________________ Allergies: No Known Allergies   Prior to Admission medications   Medication Sig Start Date End Date Taking? Authorizing Provider  amLODipine (NORVASC) 5 MG tablet Take 5 mg by mouth daily. 11/21/21  Yes [provider]  atenolol (TENORMIN) 100 MG tablet Take 100 mg by mouth every morning.   Yes [provider]  HYDROcodone-acetaminophen (NORCO) 7.5-325 MG tablet Take 1 tablet by mouth 3 (three) times daily as needed for moderate pain. 11/25/21  Yes [provider]  lovastatin (MEVACOR) 40 MG tablet Take 40 mg by mouth at bedtime.   Yes [provider]  tamsulosin (FLOMAX) 0.4 MG CAPS capsule Take 0.4 mg by mouth daily.   Yes [provider]  docusate sodium (COLACE) 100 MG capsule Take 1 capsule (100 mg total) by mouth 2 (two) times daily. Patient not taking: Reported on 12/29/2021 10/10/20   Irving Copas, PA-C  ferrous sulfate 325 (65 FE) MG tablet Take 1 tablet (325 mg total) by mouth 2 (two) times daily with a meal for 14 days. 10/10/20 10/24/20  Irving Copas, PA-C  HYDROcodone-acetaminophen (NORCO/VICODIN) 5-325 MG tablet Take 1-2 tablets by mouth every 6 (six) hours as needed for moderate pain (pain score 4-6). Patient not taking: Reported on 12/29/2021 10/10/20   Irving Copas, PA-C  methocarbamol (ROBAXIN) 500 MG tablet Take 1 tablet (500 mg total) by mouth every 6 (six) hours as needed for muscle spasms. Patient not taking: Reported on 12/29/2021 10/10/20   Irving Copas, PA-C  polyethylene glycol (MIRALAX / GLYCOLAX) 17 g packet Take 17 g by mouth 2 (two) times daily. Patient not taking: Reported on 12/29/2021  10/10/20   Irving Copas, PA-C    ___________________________________________________________________________________________________ Physical Exam:    12/29/2021    5:30 PM 12/29/2021    5:15 PM 12/29/2021    4:46 PM  Vitals with BMI  Systolic [phone]  Diastolic 66 82 78  Pulse 61 68 62     1. General:  in No  Acute distress   Chronically ill   -appearing 2. Psychological: Alert and   Oriented 3. Head/ENT:    Dry Mucous Membranes                          Head Non traumatic, neck supple                        Poor Dentition 4. SKIN decreased Skin turgor,  Skin clean Dry and intact no rash, multiple bruises 5. Heart: Regular rate and rhythm no  Murmur, no Rub or gallop 6. Lungs: no wheezes or crackles   7. Abdomen: Soft,  non-tender, Non distended  bowel sounds present 8. Lower extremities: no clubbing, cyanosis, no  edema 9. Neurologically  strength 3 out of 5 on the left , left facial droop   10.  MSK: Normal range of motion    Chart has been reviewed  ______________________________________________________________________________________________  Assessment/Plan 88 y.o. male with medical history significant of hypothyroidism chronic pain syndromes, CKD, HTN, arthritis    Admitted for stroke   Present on Admission:  Stroke (cerebrum) (HCC)  CKD (chronic kidney disease) stage 3, GFR 30-59 ml/min (HCC)  Anemia  Hyperlipidemia  Essential hypertension     CKD (chronic kidney disease) stage 3, GFR 30-59 ml/min (HCC)  -chronic avoid nephrotoxic medications such as NSAIDs, Vanco Zosyn combo,  avoid hypotension, continue to follow renal function   Anemia Obtain anemia panel Hg stable  Hyperlipidemia chronic cont home meds  Essential hypertension Allow permissive htn   Stroke (cerebrum) ([place])  - will admit based on TIA/CVA protocol,        Monitor on Tele       MRA/MRI await results         Carotid Doppler ordered        Echo to evaluate for possible  embolic source,        obtain cardiac enzymes,  ECG,   Lipid panel, TSH.        Order PT/OT evaluation.        keep nothing by mouth until passes swallow eval         Will make sure patient is on antiplatelet ASA 325  and statin        Allow permissive Hypertension keep BP <220/120        Neurology consulted     Other plan as per orders.  DVT prophylaxis:  SCD     Code Status:    Code Status: Prior FULL CODE  as per patient  I had personally discussed CODE STATUS with patient and family     Family Communication:   Family at  Bedside  plan of care was discussed   with  Daughter   Disposition Plan:      To home once workup is complete and patient is stable   Following barriers for discharge:                         Stroke work up                           Will need consultants to evaluate patient prior to discharge                       Would benefit from PT/OT eval prior to DC  Ordered                   Swallow eval - SLP ordered                                       Nutrition    consulted                   Consults called:    neurology   Admission status:  ED Disposition     ED Disposition  [place]: [facility] [[id number]]  Level of Care: Telemetry Medical [[id number]]  May admit patient to [facility] or [facility] if equivalent level of care is available:: No  Covid Evaluation: Asymptomatic - no recent exposure (last 10  days) testing not required  Diagnosis: Stroke (cerebrum) [facility]) [[id number]]  Admitting Physician: Toy Baker [[id number]]  Attending Physician: Toy Baker [[id number]]  Estimated length of stay: past midnight tomorrow  Certification:: I certify this patient will need inpatient services for at least 2 midnights            inpatient     I Expect 2 midnight stay secondary to severity of patient's current illness need for inpatient interventions justified by the following:     Severe  lab/radiological/exam abnormalities including:    Stroke on CT  and extensive comorbidities including:  CKD    That are currently affecting medical management.   I expect  patient to be hospitalized for 2 midnights requiring inpatient medical care.  Patient is at high risk for adverse outcome (such as loss of life or disability) if not treated.  Indication for inpatient stay as follows:  Severe change from baseline regarding mental status   Need for stroke work up      Level of care     tele    indefinitely please discontinue once patient no longer qualifies COVID-19 Labs     Tc Kapusta 12/29/2021, 7:53 PM    [facility]     after 2 AM please page floor coverage PA If 7AM-7PM, please contact the day team taking care of the patient using [email or web address]   Patient was evaluated in the context of the global COVID-19 pandemic, which necessitated consideration that the patient might be at risk for infection with the SARS-CoV-2 virus that causes COVID-19. Institutional protocols and algorithms that pertain to the evaluation of patients at risk for COVID-19 are in a state of rapid change based on information released by regulatory bodies including the CDC and federal and state organizations. These policies and algorithms were followed during the patient's care.

## 2021-12-29 NOTE — Assessment & Plan Note (Signed)
Allow permissive htn ?

## 2021-12-29 NOTE — ED Provider Notes (Signed)
[facility] ([facility]) EMERGENCY DEPARTMENT Provider Note   CSN: [id number] Arrival date & time: 12/29/21  1637     History  Chief Complaint  Patient presents with   Fall   Stroke Symptoms    Chad [name] is a 88 y.o. male.  HPI 88 year old male presents with fall and left-sided weakness.  History is mostly from the daughter.  She last saw him normal at around 3 or 4 PM yesterday.  He lives at home by himself.  He apparently fell at some point try to get into bed the patient cannot tell me exactly what caused him to fall.  The daughter does note the bed is a little high.  He has apparently been on the ground because he could not get up and was [name] to finally crawl and get to a phone to call his daughter.  He has had some left-sided weakness and a facial droop since.  Patient denies a headache.  He denies any pain though he has bruising to his left hand and elbow.  Daughter endorses patient only has a history of hypertension.  Chart review shows he also has CKD, BPH, chronic pain.  Home Medications Prior to Admission medications   Medication Sig Start Date End Date Taking? Authorizing Provider  amLODipine (NORVASC) 5 MG tablet Take 5 mg by mouth daily. 11/21/21  Yes [provider]  atenolol (TENORMIN) 100 MG tablet Take 100 mg by mouth every morning.   Yes [provider]  HYDROcodone-acetaminophen (NORCO) 7.5-325 MG tablet Take 1 tablet by mouth 3 (three) times daily as needed for moderate pain. 11/25/21  Yes [provider]  lovastatin (MEVACOR) 40 MG tablet Take 40 mg by mouth at bedtime.   Yes [provider]  tamsulosin (FLOMAX) 0.4 MG CAPS capsule Take 0.4 mg by mouth daily.   Yes [provider]  docusate sodium (COLACE) 100 MG capsule Take 1 capsule (100 mg total) by mouth 2 (two) times daily. Patient not taking: Reported on 12/29/2021 10/10/20   Irving Copas, PA-C  ferrous sulfate 325 (65 FE) MG tablet Take 1 tablet  (325 mg total) by mouth 2 (two) times daily with a meal for 14 days. 10/10/20 10/24/20  Irving Copas, PA-C  HYDROcodone-acetaminophen (NORCO/VICODIN) 5-325 MG tablet Take 1-2 tablets by mouth every 6 (six) hours as needed for moderate pain (pain score 4-6). Patient not taking: Reported on 12/29/2021 10/10/20   Irving Copas, PA-C  methocarbamol (ROBAXIN) 500 MG tablet Take 1 tablet (500 mg total) by mouth every 6 (six) hours as needed for muscle spasms. Patient not taking: Reported on 12/29/2021 10/10/20   Irving Copas, PA-C  polyethylene glycol (MIRALAX / GLYCOLAX) 17 g packet Take 17 g by mouth 2 (two) times daily. Patient not taking: Reported on 12/29/2021 10/10/20   Irving Copas, PA-C      Allergies    Patient has no known allergies.    Review of Systems   Review of Systems  Neurological:  Positive for weakness. Negative for headaches.   Physical Exam Updated Vital Signs BP (!) 142/66   Pulse 61   Temp 97.8 F (36.6 C) (Oral)   Resp 14   SpO2 100%  Physical Exam Vitals and nursing note reviewed.  Constitutional:      Appearance: He is well-developed.  HENT:     Head: Normocephalic and atraumatic.  Eyes:     Extraocular Movements: Extraocular movements intact.     Comments: Pupils  are miotic bilaterally No obvious visual field deficit  Cardiovascular:     Rate and Rhythm: Normal rate and regular rhythm.     Pulses:          Radial pulses are 2+ on the left side.     Heart sounds: Normal heart sounds.  Pulmonary:     Effort: Pulmonary effort is normal.     Breath sounds: Normal breath sounds.  Abdominal:     Palpations: Abdomen is soft.     Tenderness: There is no abdominal tenderness.  Musculoskeletal:     Comments: There is bruising and swelling to the dorsal left hand.  He reports no tenderness and reports he can feel me touching him but no pain.  Also has a small abrasion and bruising to the left elbow. No decreased range of motion or pain in the hips  bilaterally  Skin:    General: Skin is warm and dry.  Neurological:     Mental Status: He is alert.     Comments: Left sided facial droop. 5/5 strength in RUE, RLE. 3/5 strength in LUE. 4/5 strength in LLE    ED Results / Procedures / Treatments   Labs (all labs ordered are listed, but only abnormal results are displayed) Labs Reviewed  PROTIME-INR - Abnormal; Notable for the following components:      Result Value   Prothrombin Time 16.7 (*)    INR 1.4 (*)    All other components within normal limits  APTT - Abnormal; Notable for the following components:   aPTT 38 (*)    All other components within normal limits  CBC - Abnormal; Notable for the following components:   RBC 4.13 (*)    Hemoglobin 11.6 (*)    HCT 35.6 (*)    RDW 16.2 (*)    All other components within normal limits  DIFFERENTIAL - Abnormal; Notable for the following components:   Monocytes Absolute 1.9 (*)    All other components within normal limits  COMPREHENSIVE METABOLIC PANEL - Abnormal; Notable for the following components:   Glucose, Bld 119 (*)    BUN 35 (*)    Creatinine, Ser 1.46 (*)    Albumin 3.4 (*)    GFR, Estimated 46 (*)    All other components within normal limits  I-STAT CHEM 8, ED - Abnormal; Notable for the following components:   BUN 36 (*)    Creatinine, Ser 1.40 (*)    Glucose, Bld 118 (*)    Hemoglobin 11.6 (*)    HCT 34.0 (*)    All other components within normal limits  CBG MONITORING, ED - Abnormal; Notable for the following components:   Glucose-Capillary 112 (*)    All other components within normal limits  CK  URINALYSIS, ROUTINE W REFLEX MICROSCOPIC  VITAMIN B12  FOLATE  IRON AND TIBC  FERRITIN  RETICULOCYTES  MAGNESIUM  PHOSPHORUS  PREALBUMIN  TSH  URINALYSIS, COMPLETE (UACMP) WITH MICROSCOPIC  LACTIC ACID, PLASMA  LACTIC ACID, PLASMA  LIPID PANEL  HEMOGLOBIN A1C  TROPONIN I (HIGH SENSITIVITY)    EKG EKG Interpretation  Date/Time:  Sunday Dec 29 2021  16:48:42 EDT Ventricular Rate:  60 PR Interval:  240 QRS Duration: 84 QT Interval:  460 QTC Calculation: 460 R Axis:   -64 Text Interpretation: Sinus rhythm with 1st degree A-V block Left axis deviation Anterolateral infarct , age undetermined Interpretation limited secondary to artifact otherwise similar to Feb 2022 Confirmed by Sherwood Gambler [phone]) on 12/29/2021  5:04:03 PM  Radiology DG Elbow Complete Left  Result Date: 12/29/2021 CLINICAL DATA:  Fall.  Left elbow pain and bruising. EXAM: LEFT ELBOW - COMPLETE 3+ VIEW COMPARISON:  None Available. FINDINGS: No fracture or dislocation. Narrowed radiocapitellar joint. Marginal osteophytes project from the base of the radial head. No joint effusion. There is soft tissue edema posteriorly. IMPRESSION: No fracture or dislocation. Electronically Signed   By: Lajean Manes M.D.   On: 12/29/2021 18:12   CT HEAD WO CONTRAST  Result Date: 12/29/2021 CLINICAL DATA:  Neurologic deficit EXAM: CT HEAD WITHOUT CONTRAST TECHNIQUE: Contiguous axial images were obtained from the base of the skull through the vertex without intravenous contrast. RADIATION DOSE REDUCTION: This exam was performed according to the departmental dose-optimization program which includes automated exposure control, adjustment of the mA and/or kV according to patient size and/or use of iterative reconstruction technique. COMPARISON:  None Available. FINDINGS: Brain: Confluent hypodensities throughout the periventricular white matter are most consistent with age-indeterminate small vessel ischemic changes, likely chronic. Focal hypodensities in the left basal ganglia and right frontal periventricular white matter consistent with chronic lacunar infarcts. Age-indeterminate lacunar infarct is seen within the right basal ganglia image 18/3. No other signs of acute infarct or hemorrhage. Lateral ventricles and midline structures are otherwise unremarkable. No acute extra-axial fluid collections.  No mass effect. Vascular: No hyperdense vessel or unexpected calcification. Skull: Normal. Negative for fracture or focal lesion. Sinuses/Orbits: No acute finding. Other: None. IMPRESSION: 1. Age indeterminate lacunar infarct within the right basal ganglia. 2. Age-indeterminate small-vessel ischemic changes throughout the periventricular white matter, favor chronic. 3. Chronic left basal ganglia and right frontal white matter lacunar infarcts as above. 4. No acute hemorrhage. Electronically Signed   By: Randa Ngo M.D.   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 Multiplanar CT image reconstructions were also generated. RADIATION DOSE REDUCTION: This exam was performed according to the departmental dose-optimization program which includes automated exposure control, adjustment of the mA and/or kV according to patient size and/or use of iterative reconstruction technique. COMPARISON:  None Available. FINDINGS: Alignment: Mild degenerative anterolisthesis of C2 and C3. Otherwise alignment is anatomic. Skull base and vertebrae: No acute fracture. No primary bone lesion or focal pathologic process. Soft tissues and spinal canal: No prevertebral fluid or swelling. No visible canal hematoma. Disc levels: Partial bony fusion across the disc spaces at C3-4 and C4-5. There is severe multilevel spondylosis throughout the remainder of the cervical spine, greatest at C5-6 and C6-7. Diffuse facet hypertrophy. Upper chest: Airway is patent. Lung apices are clear. Prominent atherosclerosis of the aortic arch. Other: Reconstructed images demonstrate no additional findings. IMPRESSION: 1. Extensive multilevel cervical degenerative changes. No acute fracture. Electronically Signed   By: Randa Ngo  M.D.   On: 12/29/2021 17:38   DG Chest Portable 1 View  Result Date: 12/29/2021 CLINICAL DATA:  CVA. Fell last night. Family concerned patient injured left side EXAM: PORTABLE CHEST 1 VIEW COMPARISON:  Chest two views 03/23/2013 FINDINGS: Cardiac silhouette is mildly enlarged. Moderate calcifications within the aortic arch and descending thoracic aorta. Minimal bibasilar horizontal linear chronic scarring is unchanged. No focal airspace opacity to indicate pneumonia. No pleural effusion or pneumothorax. Moderate multilevel degenerative disc changes of the thoracic spine. IMPRESSION: Mild stable cardiomegaly. No acute lung process.  Electronically Signed   By: Yvonne Kendall M.D.   On: 12/29/2021 19:19   DG Hand Complete Left  Result Date: 12/29/2021 CLINICAL DATA:  Fall.  Bruising to posterior left hand. EXAM: LEFT HAND - COMPLETE 3+ VIEW COMPARISON:  None Available. FINDINGS: There is diffuse decreased bone mineralization. A pulse oximeter overlies the distal index finger obscuring portions. Degenerative changes including joint space narrowing, subchondral sclerosis and peripheral osteophytosis are moderate at the triscaphe joint; moderate to severe at the thumb carpometacarpal, thumb metacarpophalangeal, and thumb interphalangeal joints; and moderate to severe at the DIP joints and moderate at the PIP joints of the second through fifth fingers. No acute fracture is seen. No dislocation. IMPRESSION: Osteoarthritis as above. No acute fracture. Electronically Signed   By: Yvonne Kendall M.D.   On: 12/29/2021 18:12    Procedures Procedures    Medications Ordered in ED Medications  HYDROcodone-acetaminophen (NORCO) 7.5-325 MG per tablet 1 tablet (has no administration in time range)  pravastatin (PRAVACHOL) tablet 40 mg (has no administration in time range)  tamsulosin (FLOMAX) capsule 0.4 mg (has no administration in time range)   stroke: early stages of recovery book (has no administration in time  range)  0.9 %  sodium chloride infusion (has no administration in time range)  acetaminophen (TYLENOL) tablet 650 mg (has no administration in time range)    Or  acetaminophen (TYLENOL) 160 MG/5ML solution 650 mg (has no administration in time range)    Or  acetaminophen (TYLENOL) suppository 650 mg (has no administration in time range)  aspirin suppository 300 mg (has no administration in time range)    Or  aspirin tablet 325 mg (has no administration in time range)  sodium chloride flush (NS) 0.9 % injection 3 mL (3 mLs Intravenous Given 12/29/21 1717)    ED Course/ Medical Decision Making/ A&P                           Medical Decision Making Amount and/or Complexity of Data Reviewed Independent Historian:     Details: Daughter External Data Reviewed: notes. Labs: ordered. Radiology: ordered and independent interpretation performed. ECG/medicine tests: ordered and independent interpretation performed.  Risk Decision regarding hospitalization.   It appears the patient had a stroke which caused him to fall.  He has significant left-sided weakness on exam.  I personally reviewed/interpreted the CT images of his head and he does have what appears to be an age-indeterminate right-sided infarct that would go along with his symptoms.  No head bleed.  Also viewed/interpreted the x-ray images which show no fractures to the hand or elbow.  Labs show CKD but it seems stable.  ECG without acute arrhythmia such as A-fib.  Discussed diagnosis with daughter and patient.  I have consulted neurology, Dr. Curly Shores, as well as the hospitalist, Dr. Roel Cluck.        Final Clinical Impression(s) / ED Diagnoses Final diagnoses:  Acute ischemic stroke [facility])    Rx / DC Orders ED Discharge Orders     None         Sherwood Gambler, MD 12/29/21 2016

## 2021-12-29 NOTE — Assessment & Plan Note (Addendum)
Obtain anemia panel Hg stable

## 2021-12-29 NOTE — ED Triage Notes (Addendum)
Pt fell last night coming from bathroom to bed.  Family concerned that he injured L side lying in floor all night and couldn't get to his phone today until he knocked it down with his cane.  Pt denies pain.  Pt leaning to left, L sided facial droop, speech slurred, and unable to raise L arm.  Daughter states he was also not able to raise L leg to ambulate and they had to pick him up.  Pt oriented. LKW 3pm yesterday.

## 2021-12-29 NOTE — Assessment & Plan Note (Signed)
chronic cont home meds

## 2021-12-29 NOTE — Assessment & Plan Note (Signed)
-   will admit based on TIA/CVA protocol,        Monitor on Tele       MRA/MRI await results         Carotid Doppler ordered        Echo to evaluate for possible embolic source,        obtain cardiac enzymes,  ECG,   Lipid panel, TSH.        Order PT/OT evaluation.        keep nothing by mouth until passes swallow eval         Will make sure patient is on antiplatelet ASA 325  and statin        Allow permissive Hypertension keep BP <220/120        Neurology consulted

## 2021-12-29 NOTE — ED Notes (Signed)
Patient transported to CT 

## 2021-12-29 NOTE — ED Notes (Signed)
2nd lactic not needed per MD Doutova

## 2021-12-30 ENCOUNTER — Encounter (HOSPITAL_COMMUNITY): Payer: Medicare Other

## 2021-12-30 ENCOUNTER — Inpatient Hospital Stay (HOSPITAL_COMMUNITY): Payer: Medicare Other

## 2021-12-30 DIAGNOSIS — I639 Cerebral infarction, unspecified: Secondary | ICD-10-CM

## 2021-12-30 DIAGNOSIS — I6389 Other cerebral infarction: Secondary | ICD-10-CM | POA: Diagnosis not present

## 2021-12-30 LAB — LACTIC ACID, PLASMA: Lactic Acid, Venous: 1.1 mmol/L (ref 0.5–1.9)

## 2021-12-30 LAB — LIPID PANEL
Cholesterol: 149 mg/dL (ref 0–200)
HDL: 45 mg/dL (ref >40)
LDL Cholesterol: 90 mg/dL (ref 0–99)
Total CHOL/HDL Ratio: 3.3 RATIO
Triglycerides: 68 mg/dL (ref <150)
VLDL: 14 mg/dL (ref 0–40)

## 2021-12-30 LAB — ECHOCARDIOGRAM COMPLETE
Area-P 1/2: 4.6 cm2
MV M vel: 5.8 m/s
MV Peak grad: 134.6 mmHg
P 1/2 time: 400 msec
S' Lateral: 3.1 cm

## 2021-12-30 LAB — HEMOGLOBIN A1C
Hgb A1c MFr Bld: 6.1 % — ABNORMAL HIGH (ref 4.8–5.6)
Mean Plasma Glucose: 128.37 mg/dL

## 2021-12-30 LAB — CK: Total CK: 227 U/L (ref 49–397)

## 2021-12-30 LAB — PREALBUMIN: Prealbumin: 19.7 mg/dL (ref 18–38)

## 2021-12-30 MED ORDER — ASPIRIN 81 MG PO TBEC: 81.0000 mg | DELAYED_RELEASE_TABLET | Freq: Every day | ORAL | Status: DC

## 2021-12-30 MED ORDER — ROSUVASTATIN CALCIUM 20 MG PO TABS
20.0000 mg | ORAL_TABLET | Freq: Every day | ORAL | Status: DC
Start: 2021-12-30 — End: 2022-01-02
  Administered 2021-12-30 – 2022-01-02 (×3): 20 mg via ORAL
  Filled 2021-12-30 (×3): qty 1

## 2021-12-30 MED ORDER — SODIUM CHLORIDE 0.9 % IV SOLN: INTRAVENOUS | Status: DC

## 2021-12-30 MED ORDER — IOHEXOL 350 MG/ML SOLN
80.0000 mL | Freq: Once | INTRAVENOUS | Status: AC | PRN
  Administered 2021-12-30: 80 mL via INTRAVENOUS

## 2021-12-30 MED ORDER — ENOXAPARIN SODIUM 30 MG/0.3ML IJ SOSY
30.0000 mg | PREFILLED_SYRINGE | INTRAMUSCULAR | Status: DC
Start: 2021-12-30 — End: 2022-01-01
  Administered 2021-12-30: 30 mg via SUBCUTANEOUS
  Filled 2021-12-30 (×2): qty 0.3

## 2021-12-30 MED ORDER — LABETALOL HCL 5 MG/ML IV SOLN
10.0000 mg | INTRAVENOUS | Status: DC | PRN
  Administered 2021-12-31: 10 mg via INTRAVENOUS
  Filled 2021-12-30: qty 4

## 2021-12-30 MED ORDER — CLOPIDOGREL BISULFATE 75 MG PO TABS: 300.0000 mg | ORAL_TABLET | Freq: Once | ORAL | Status: DC

## 2021-12-30 MED ORDER — CLOPIDOGREL BISULFATE 75 MG PO TABS: 75.0000 mg | ORAL_TABLET | Freq: Every day | ORAL | Status: DC

## 2021-12-30 MED ORDER — ASPIRIN 325 MG PO TBEC
325.0000 mg | DELAYED_RELEASE_TABLET | Freq: Every day | ORAL | Status: DC
  Administered 2021-12-30: 325 mg via ORAL
  Filled 2021-12-30: qty 1

## 2021-12-30 NOTE — Progress Notes (Signed)
  Echocardiogram 2D Echocardiogram has been performed.  Chad [name] 12/30/2021, 2:31 PM

## 2021-12-30 NOTE — Evaluation (Signed)
Speech Language Pathology Evaluation Patient Details Name: Chad [name] MRN: [id number] DOB: 03/31/1932 Today's Date: 12/30/2021 Time: 1115-1130 SLP Time Calculation (min) (ACUTE ONLY): 15 min  Problem List:  Patient Active Problem List   Diagnosis Date Noted   Stroke (cerebrum) ([place]) 12/29/2021   CKD (chronic kidney disease) stage 3, GFR 30-59 ml/min ([place]) 12/29/2021   Anemia 12/29/2021   Hyperlipidemia 12/29/2021   Essential hypertension 12/29/2021   Osteoarthritis of left knee 10/09/2020   S/P left TKA 10/09/2020   S/P total knee arthroplasty, left 10/09/2020   Acute blood loss anemia 03/31/2013   Osteoarthritis of right knee 03/30/2013   Past Medical History:  Past Medical History:  Diagnosis Date   Arthritis    Heart murmur    asa child    Hypertension    Past Surgical History:  Past Surgical History:  Procedure Laterality Date   EYE SURGERY Bilateral 2022   TONSILLECTOMY     TOTAL KNEE ARTHROPLASTY Right 03/30/2013   Procedure: RIGHT TOTAL KNEE ARTHROPLASTY;  Surgeon: Tobi Bastos, MD;  Location: [facility];  Service: Orthopedics;  Laterality: Right;   TOTAL KNEE ARTHROPLASTY Left 10/09/2020   Procedure: TOTAL KNEE ARTHROPLASTY;  Surgeon: Paralee Cancel, MD;  Location: [facility];  Service: Orthopedics;  Laterality: Left;  70 mins   HPI:  88 yo male presenting to the ED on 5/28 with L sided weakness, facial droop, slurred speech, and fall. 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 PMH including hypothyroidism chronic pain syndromes, CKD, HTN, arthritis, bil TKA.   Assessment / Plan / Recommendation Clinical Impression  [name] presents with left inattention, dysarthria of speech, decreased sustained/selective attention, mild impairments in working memory and judgment.  Expressive/receptive language are intact; sense of humor preserved.  He is interactive and motivated to participate in therapies.  Recommend AIR  from an SLP standpoint given his communication and swallowing issues. Family is extremely supportive.    SLP Assessment  SLP Recommendation/Assessment: Patient needs continued Speech Long Pathology Services SLP Visit Diagnosis: Cognitive communication deficit (R41.841)    Recommendations for follow up therapy are one component of a multi-disciplinary discharge planning process, led by the attending physician.  Recommendations may be updated based on patient status, additional functional criteria and insurance authorization.    Follow Up Recommendations  Acute inpatient rehab (3hours/day)    Assistance Recommended at Discharge  Frequent or constant Supervision/Assistance  Functional Status Assessment Patient has had a recent decline in their functional status and demonstrates the ability to make significant improvements in function in a reasonable and predictable amount of time.  Frequency and Duration min 3x week  2 weeks      SLP Evaluation Cognition  Overall Cognitive Status: Impaired/Different from baseline Arousal/Alertness: Awake/alert Orientation Level: Oriented X4 Attention: Sustained;Selective Sustained Attention: Impaired Sustained Attention Impairment: Verbal basic Selective Attention: Impaired Selective Attention Impairment: Verbal basic Memory: Impaired Memory Impairment: Retrieval deficit Awareness: Impaired Awareness Impairment: Emergent impairment Problem Solving: Impaired       Comprehension  Auditory Comprehension Overall Auditory Comprehension: Appears within functional limits for tasks assessed Reading Comprehension Reading Status: Not tested    Expression Expression Primary Mode of Expression: Verbal Verbal Expression Overall Verbal Expression: Appears within functional limits for tasks assessed Written Expression Dominant Hand: Right   Oral / Motor  Oral Motor/Sensory Function Overall Oral Motor/Sensory Function: Moderate impairment Facial  ROM: Reduced left;Suspected CN VII (facial) dysfunction Facial Symmetry: Abnormal symmetry left;Suspected CN VII (facial) dysfunction  Facial Strength: Reduced left;Suspected CN VII (facial) dysfunction Facial Sensation: Reduced left;Suspected CN V (Trigeminal) dysfunction Lingual ROM: Reduced left;Suspected CN XII (hypoglossal) dysfunction Lingual Symmetry: Abnormal symmetry left;Suspected CN XII (hypoglossal) dysfunction Lingual Strength: Reduced Motor Speech Overall Motor Speech: Impaired Respiration: Within functional limits Phonation: Wet Resonance: Within functional limits Articulation: Impaired Intelligibility: Intelligibility reduced Word: 50-74% accurate Phrase: 75-100% accurate Sentence: 75-100% accurate Motor Speech Errors: Not applicable            Juan Quam Laurice 12/30/2021, 12:23 PM Estill Bamberg L. Tivis Ringer, [place] Office number [phone] Pager [phone]

## 2021-12-30 NOTE — ED Notes (Signed)
ED TO INPATIENT HANDOFF REPORT  ED Nurse Name and Phone #: [email or web address] [phone]  S Name/Age/Gender Chad [name] 88 y.o. male Room/Bed: 037C/037C  Code Status   Code Status: Full Code  Home/SNF/Other  Patient oriented to: self, place, time, and situation Is this baseline? Yes   Triage Complete: Triage complete  Chief Complaint Stroke (cerebrum) [facility]) [I63.9]  Triage Note Pt fell last night coming from bathroom to bed.  Family concerned that he injured L side lying in floor all night and couldn't get to his phone today until he knocked it down with his cane.  Pt denies pain.  Pt leaning to left, L sided facial droop, speech slurred, and unable to raise L arm.  Daughter states he was also not able to raise L leg to ambulate and they had to pick him up.  Pt oriented. LKW 3pm yesterday.   Allergies No Known Allergies  Level of Care/Admitting Diagnosis ED Disposition     ED Disposition  Admit   Condition  --   Comment  Hospital Area: [facility] [[id number]]  Level of Care: Telemetry Medical [104]  May admit patient to [facility] or [facility] if equivalent level of care is available:: No  Covid Evaluation: Asymptomatic - no recent exposure (last 10 days) testing not required  Diagnosis: Stroke (cerebrum) [facility]) [[id number]]  Admitting Physician: Toy Baker [[id number]]  Attending Physician: Toy Baker [[id number]]  Estimated length of stay: past midnight tomorrow  Certification:: I certify this patient will need inpatient services for at least 2 midnights          B Medical/Surgery History Past Medical History:  Diagnosis Date   Arthritis    Heart murmur    asa child    Hypertension    Past Surgical History:  Procedure Laterality Date   EYE SURGERY Bilateral 2022   TONSILLECTOMY     TOTAL KNEE ARTHROPLASTY Right 03/30/2013   Procedure: RIGHT TOTAL KNEE ARTHROPLASTY;  Surgeon: Tobi Bastos, MD;  Location: [facility];  Service: Orthopedics;   Laterality: Right;   TOTAL KNEE ARTHROPLASTY Left 10/09/2020   Procedure: TOTAL KNEE ARTHROPLASTY;  Surgeon: Paralee Cancel, MD;  Location: [facility];  Service: Orthopedics;  Laterality: Left;  70 mins     A IV Location/Drains/Wounds Patient Lines/Drains/Airways Status     Active Line/Drains/Airways     Name Placement date Placement time Site Days   Peripheral IV 12/29/21 20 G Posterior;Right Forearm 12/29/21  1712  Forearm  1   Urethral Catheter C.Dwyer, RN Latex 16 Fr. 10/09/20  0728  Latex  447   Incision (Closed) 10/09/20 Knee Left 10/09/20  0846  -- 447            Intake/Output Last 24 hours  Intake/Output Summary (Last 24 hours) at 12/30/2021 1014 Last data filed at 12/30/2021 0524 Gross per 24 hour  Intake 626.13 ml  Output 125 ml  Net 501.13 ml    Labs/Imaging Results for orders placed or performed during the hospital encounter of 12/29/21 (from the past 48 hour(s))  CBG monitoring, ED     Status: Abnormal   Collection Time: 12/29/21  4:58 PM  Result Value Ref Range   Glucose-Capillary 112 (H) 70 - 99 mg/dL    Comment: Glucose reference range applies only to samples taken after fasting for at least 8 hours.   Comment 1 Notify RN    Comment 2 Document in Chart   Protime-INR     Status: Abnormal  Collection Time: 12/29/21  5:00 PM  Result Value Ref Range   Prothrombin Time 16.7 (H) 11.4 - 15.2 seconds   INR 1.4 (H) 0.8 - 1.2    Comment: (NOTE) INR goal varies based on device and disease states. Performed at [facility], [place] [address] [place], [place] [address]   APTT     Status: Abnormal   Collection Time: 12/29/21  5:00 PM  Result Value Ref Range   aPTT 38 (H) 24 - 36 seconds    Comment:        IF BASELINE aPTT IS ELEVATED, SUGGEST PATIENT RISK ASSESSMENT BE USED TO DETERMINE APPROPRIATE ANTICOAGULANT THERAPY. Performed at [facility], [place] [address], [place], [place] [address]   CBC     Status: Abnormal   Collection Time: 12/29/21  5:00  PM  Result Value Ref Range   WBC 6.9 4.0 - 10.5 K/uL   RBC 4.13 (L) 4.22 - 5.81 MIL/uL   Hemoglobin 11.6 (L) 13.0 - 17.0 g/dL   HCT 35.6 (L) 39.0 - 52.0 %   MCV 86.2 80.0 - 100.0 fL   MCH 28.1 26.0 - 34.0 pg   MCHC 32.6 30.0 - 36.0 g/dL   RDW 16.2 (H) 11.5 - 15.5 %   Platelets 197 150 - 400 K/uL   nRBC 0.0 0.0 - 0.2 %    Comment: Performed at [facility] [address], [place], [place] [address]  Differential     Status: Abnormal   Collection Time: 12/29/21  5:00 PM  Result Value Ref Range   Neutrophils Relative % 55 %   Neutro Abs 3.8 1.7 - 7.7 K/uL   Lymphocytes Relative 18 %   Lymphs Abs 1.2 0.7 - 4.0 K/uL   Monocytes Relative 27 %   Monocytes Absolute 1.9 (H) 0.1 - 1.0 K/uL   Eosinophils Relative 0 %   Eosinophils Absolute 0.0 0.0 - 0.5 K/uL   Basophils Relative 0 %   Basophils Absolute 0.0 0.0 - 0.1 K/uL   Immature Granulocytes 0 %   Abs Immature Granulocytes 0.01 0.00 - 0.07 K/uL    Comment: Performed at [facility] [address], [place], [place] [address]  Comprehensive metabolic panel     Status: Abnormal   Collection Time: 12/29/21  5:00 PM  Result Value Ref Range   Sodium 140 135 - 145 mmol/L   Potassium 4.4 3.5 - 5.1 mmol/L   Chloride 108 98 - 111 mmol/L   CO2 22 22 - 32 mmol/L   Glucose, Bld 119 (H) 70 - 99 mg/dL    Comment: Glucose reference range applies only to samples taken after fasting for at least 8 hours.   BUN 35 (H) 8 - 23 mg/dL   Creatinine, Ser 1.46 (H) 0.61 - 1.24 mg/dL   Calcium 9.9 8.9 - 10.3 mg/dL   Total Protein 7.6 6.5 - 8.1 g/dL   Albumin 3.4 (L) 3.5 - 5.0 g/dL   AST 25 15 - 41 U/L   ALT 12 0 - 44 U/L   Alkaline Phosphatase 56 38 - 126 U/L   Total Bilirubin 0.8 0.3 - 1.2 mg/dL   GFR, Estimated 46 (L) >60 mL/min    Comment: (NOTE) Calculated using the CKD-EPI Creatinine Equation (2021)    Anion gap 10 5 - 15    Comment: Performed at [facility] [address] [place], [place] [address]  CK     Status: None  Collection Time: 12/29/21  5:00 PM  Result Value Ref Range   Total CK 328 49 - 397 U/L    Comment: Performed at [facility], [place] [address] [place], [place] [address]  Troponin I (High Sensitivity)     Status: Abnormal   Collection Time: 12/29/21  5:00 PM  Result Value Ref Range   Troponin I (High Sensitivity) 76 (H) <18 ng/L    Comment: (NOTE) Elevated high sensitivity troponin I (hsTnI) values and significant  changes across serial measurements may suggest ACS but many other  chronic and acute conditions are known to elevate hsTnI results.  Refer to the "Links" section for chest pain algorithms and additional  guidance. Performed at [facility], [place] [address], [place], [place] [address]   Magnesium     Status: None   Collection Time: 12/29/21  5:00 PM  Result Value Ref Range   Magnesium 1.9 1.7 - 2.4 mg/dL    Comment: Performed at [facility], [place] [address] [place], [place] [address]  Phosphorus     Status: None   Collection Time: 12/29/21  5:00 PM  Result Value Ref Range   Phosphorus 3.5 2.5 - 4.6 mg/dL    Comment: Performed at [facility], [place] [address], [place], [place] [address]  I-stat chem 8, ED     Status: Abnormal   Collection Time: 12/29/21  5:22 PM  Result Value Ref Range   Sodium 141 135 - 145 mmol/L   Potassium 4.4 3.5 - 5.1 mmol/L   Chloride 109 98 - 111 mmol/L   BUN 36 (H) 8 - 23 mg/dL   Creatinine, Ser 1.40 (H) 0.61 - 1.24 mg/dL   Glucose, Bld 118 (H) 70 - 99 mg/dL    Comment: Glucose reference range applies only to samples taken after fasting for at least 8 hours.   Calcium, Ion 1.24 1.15 - 1.40 mmol/L   TCO2 24 22 - 32 mmol/L   Hemoglobin 11.6 (L) 13.0 - 17.0 g/dL   HCT 34.0 (L) 39.0 - 52.0 %  Vitamin B12     Status: None   Collection Time: 12/29/21  8:58 PM  Result Value Ref Range   Vitamin B-12 359 180 - 914 pg/mL    Comment: (NOTE) This assay is not validated for testing neonatal or myeloproliferative syndrome  specimens for Vitamin B12 levels. Performed at [facility], [place] [address] [place], [place] [address]   Folate     Status: None   Collection Time: 12/29/21  8:58 PM  Result Value Ref Range   Folate 29.0 >5.9 ng/mL    Comment: Performed at [facility], [place] [address], [place], [place] [address]  Iron and TIBC     Status: None   Collection Time: 12/29/21  8:58 PM  Result Value Ref Range   Iron 53 45 - 182 ug/dL   TIBC 294 250 - 450 ug/dL   Saturation Ratios 18 17.9 - 39.5 %   UIBC 241 ug/dL    Comment: Performed at [facility], [place] [address], [place], [place] [address]  Ferritin     Status: None   Collection Time: 12/29/21  8:58 PM  Result Value Ref Range   Ferritin 78 24 - 336 ng/mL    Comment: Performed at [facility] [address] [place], [place] [address]  Reticulocytes     Status: Abnormal   Collection Time: 12/29/21  8:58  PM  Result Value Ref Range   Retic Ct Pct 1.5 0.4 - 3.1 %   RBC. 3.88 (L) 4.22 - 5.81 MIL/uL   Retic Count, Absolute 59.8 19.0 - 186.0 K/uL   Immature Retic Fract 16.3 (H) 2.3 - 15.9 %    Comment: Performed at [facility] [address] [place], [place] [address]  Lactic acid, plasma     Status: None   Collection Time: 12/29/21  8:58 PM  Result Value Ref Range   Lactic Acid, Venous 1.3 0.5 - 1.9 mmol/L    Comment: Performed at [facility] [address], [place], [place] [address]  Hemoglobin A1c     Status: Abnormal   Collection Time: 12/29/21  8:58 PM  Result Value Ref Range   Hgb A1c MFr Bld 6.1 (H) 4.8 - 5.6 %    Comment: (NOTE) Pre diabetes:          5.7%-6.4%  Diabetes:              >6.4%  Glycemic control for   <7.0% adults with diabetes    Mean Plasma Glucose 128.37 mg/dL    Comment: Performed at [facility] [address] [place], [place] [address]  TSH     Status: None   Collection Time: 12/29/21  8:58 PM  Result Value Ref Range   TSH 2.903 0.350 - 4.500 uIU/mL    Comment:  Performed by a 3rd Generation assay with a functional sensitivity of <=0.01 uIU/mL. Performed at [facility], [place] [address], [place], [place] [address]   Urinalysis, Complete w Microscopic Urine, Clean Catch     Status: Abnormal   Collection Time: 12/29/21 10:33 PM  Result Value Ref Range   Color, Urine YELLOW YELLOW   APPearance CLEAR CLEAR   Specific Gravity, Urine 1.011 1.005 - 1.030   pH 5.0 5.0 - 8.0   Glucose, UA NEGATIVE NEGATIVE mg/dL   Hgb urine dipstick SMALL (A) NEGATIVE   Bilirubin Urine NEGATIVE NEGATIVE   Ketones, ur NEGATIVE NEGATIVE mg/dL   Protein, ur 30 (A) NEGATIVE mg/dL   Nitrite NEGATIVE NEGATIVE   Leukocytes,Ua NEGATIVE NEGATIVE   RBC / HPF 0-5 0 - 5 RBC/hpf   WBC, UA 0-5 0 - 5 WBC/hpf   Bacteria, UA NONE SEEN NONE SEEN   Mucus PRESENT     Comment: Performed at [facility] [address], [place], [place] [address]  Prealbumin     Status: None   Collection Time: 12/30/21  4:12 AM  Result Value Ref Range   Prealbumin 19.7 18 - 38 mg/dL    Comment: Performed at [facility] [address], [place], [place] [address]  Lipid panel     Status: None   Collection Time: 12/30/21  4:12 AM  Result Value Ref Range   Cholesterol 149 0 - 200 mg/dL   Triglycerides 68 <150 mg/dL   HDL 45 >40 mg/dL   Total CHOL/HDL Ratio 3.3 RATIO   VLDL 14 0 - 40 mg/dL   LDL Cholesterol 90 0 - 99 mg/dL    Comment:        Total Cholesterol/HDL:CHD Risk Coronary Heart Disease Risk Table                     Men   Women  1/2 Average Risk   3.4   3.3  Average Risk       5.0  4.4  2 X Average Risk   9.6   7.1  3 X Average Risk  23.4   11.0        Use the calculated Patient Ratio above and the CHD Risk Table to determine the patient's CHD Risk.        ATP III CLASSIFICATION (LDL):  <100     mg/dL   Optimal  100-129  mg/dL   Near or Above                    Optimal  130-159  mg/dL   Borderline  160-189  mg/dL   High  >190     mg/dL   Very High Performed  at [facility] [address] [place], [place] [address]   CK     Status: None   Collection Time: 12/30/21  4:12 AM  Result Value Ref Range   Total CK 227 49 - 397 U/L    Comment: Performed at [facility], [place] [address] [place], [place] [address]  Lactic acid, plasma     Status: None   Collection Time: 12/30/21  4:18 AM  Result Value Ref Range   Lactic Acid, Venous 1.1 0.5 - 1.9 mmol/L    Comment: Performed at [facility] [address] [place] [address], [place], [place] [address]   DG Elbow Complete Left  Result Date: 12/29/2021 CLINICAL DATA:  Fall.  Left elbow pain and bruising. EXAM: LEFT ELBOW - COMPLETE 3+ VIEW COMPARISON:  None Available. FINDINGS: No fracture or dislocation. Narrowed radiocapitellar joint. Marginal osteophytes project from the base of the radial head. No joint effusion. There is soft tissue edema posteriorly. IMPRESSION: No fracture or dislocation. Electronically Signed   By: Lajean Manes M.D.   On: 12/29/2021 18:12   CT HEAD WO CONTRAST  Result Date: 12/29/2021 CLINICAL DATA:  Neurologic deficit EXAM: CT HEAD WITHOUT CONTRAST TECHNIQUE: Contiguous axial images were obtained from the base of the skull through the vertex without intravenous contrast. RADIATION DOSE REDUCTION: This exam was performed according to the departmental dose-optimization program which includes automated exposure control, adjustment of the mA and/or kV according to patient size and/or use of iterative reconstruction technique. COMPARISON:  None Available. FINDINGS: Brain: Confluent hypodensities throughout the periventricular white matter are most consistent with age-indeterminate small vessel ischemic changes, likely chronic. Focal hypodensities in the left basal ganglia and right frontal periventricular white matter consistent with chronic lacunar infarcts. Age-indeterminate lacunar infarct is seen within the right basal ganglia image 18/3. No other signs of acute infarct or hemorrhage.  Lateral ventricles and midline structures are otherwise unremarkable. No acute extra-axial fluid collections. No mass effect. Vascular: No hyperdense vessel or unexpected calcification. Skull: Normal. Negative for fracture or focal lesion. Sinuses/Orbits: No acute finding. Other: None. IMPRESSION: 1. Age indeterminate lacunar infarct within the right basal ganglia. 2. Age-indeterminate small-vessel ischemic changes throughout the periventricular white matter, favor chronic. 3. Chronic left basal ganglia and right frontal white matter lacunar infarcts as above. 4. No acute hemorrhage. Electronically Signed   By: Randa Ngo M.D.   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 Multiplanar CT image reconstructions were also generated. RADIATION DOSE REDUCTION: This exam was performed according to the departmental dose-optimization program which includes automated exposure  control, adjustment of the mA and/or kV according to patient size and/or use of iterative reconstruction technique. COMPARISON:  None Available. FINDINGS: Alignment: Mild degenerative anterolisthesis of C2 and C3. Otherwise alignment is anatomic. Skull base and vertebrae: No acute fracture. No primary bone lesion or focal pathologic process. Soft tissues and spinal canal: No prevertebral fluid or swelling. No visible canal hematoma. Disc levels: Partial bony fusion across the disc spaces at C3-4 and C4-5. There is severe multilevel spondylosis throughout the remainder of the cervical spine, greatest at C5-6 and C6-7. Diffuse facet hypertrophy. Upper chest: Airway is patent. Lung apices are clear. Prominent atherosclerosis of the aortic arch. Other: Reconstructed images demonstrate no additional findings. IMPRESSION: 1.  Extensive multilevel cervical degenerative changes. No acute fracture. Electronically Signed   By: Randa Ngo M.D.   On: 12/29/2021 17:38   MR ANGIO HEAD WO CONTRAST  Result Date: 12/30/2021 CLINICAL DATA:  There are deficit with acute stroke suspected EXAM: MRI HEAD WITHOUT CONTRAST MRA HEAD WITHOUT CONTRAST TECHNIQUE: Multiplanar, multi-echo pulse sequences of the brain and surrounding structures were acquired without intravenous contrast. Angiographic images of the Circle of Willis were acquired using MRA technique without intravenous contrast. COMPARISON:  Head CT from yesterday FINDINGS: MRI HEAD FINDINGS Brain: Cluster of small acute infarcts in the right insular cortex, right frontal cortex, and deep right cerebral white matter. These are in a MCA distribution. Background of advanced chronic small vessel ischemia with confluent gliosis in the hemispheric white matter. Chronic lacunar infarcts in the deep cerebrum. Small remote left cerebral infarct. Gradient signal at the lower right sylvian fissure with there is calcification along the MCA branches by CT. Vascular: Major flow voids are preserved Skull and upper cervical spine: Normal marrow signal Sinuses/Orbits: Bilateral cataract resection MRA HEAD FINDINGS Anterior circulation: High-grade narrowing involving the upper right M2 branch with there is a gradient hypointensity noted above. Elsewhere vessels are smoothly contoured and widely patent. Posterior circulation: Vertebrobasilar arteries are smoothly contoured and widely patent. IMPRESSION: Brain MRI: 1. Cluster of small cortical and white matter infarcts within the right MCA distribution. 2. Background of advanced chronic small vessel ischemia. Intracranial MRA: Focal high-grade narrowing at the right M2 level where there is calcification by CT. This could reflect calcified plaque or a calcified embolus. If an embolus, the proximal nature and degree of mild infarct with suggest additional  territory of risk. Electronically Signed   By: Jorje Guild M.D.   On: 12/30/2021 07:53   MR BRAIN WO CONTRAST  Result Date: 12/30/2021 CLINICAL DATA:  There are deficit with acute stroke suspected EXAM: MRI HEAD WITHOUT CONTRAST MRA HEAD WITHOUT CONTRAST TECHNIQUE: Multiplanar, multi-echo pulse sequences of the brain and surrounding structures were acquired without intravenous contrast. Angiographic images of the Circle of Willis were acquired using MRA technique without intravenous contrast. COMPARISON:  Head CT from yesterday FINDINGS: MRI HEAD FINDINGS Brain: Cluster of small acute infarcts in the right insular cortex, right frontal cortex, and deep right cerebral white matter. These are in a MCA distribution. Background of advanced chronic small vessel ischemia with confluent gliosis in the hemispheric white matter. Chronic lacunar infarcts in the deep cerebrum. Small remote left cerebral infarct. Gradient signal at the lower right sylvian fissure with there is calcification along the MCA branches by CT. Vascular: Major flow voids are preserved Skull and upper cervical spine: Normal marrow signal Sinuses/Orbits: Bilateral cataract resection MRA HEAD FINDINGS Anterior circulation: High-grade narrowing involving the upper right M2 branch with there is a  gradient hypointensity noted above. Elsewhere vessels are smoothly contoured and widely patent. Posterior circulation: Vertebrobasilar arteries are smoothly contoured and widely patent. IMPRESSION: Brain MRI: 1. Cluster of small cortical and white matter infarcts within the right MCA distribution. 2. Background of advanced chronic small vessel ischemia. Intracranial MRA: Focal high-grade narrowing at the right M2 level where there is calcification by CT. This could reflect calcified plaque or a calcified embolus. If an embolus, the proximal nature and degree of mild infarct with suggest additional territory of risk. Electronically Signed   By: Jorje Guild M.D.   On: 12/30/2021 07:53   DG Chest Portable 1 View  Result Date: 12/29/2021 CLINICAL DATA:  CVA. Fell last night. Family concerned patient injured left side EXAM: PORTABLE CHEST 1 VIEW COMPARISON:  Chest two views 03/23/2013 FINDINGS: Cardiac silhouette is mildly enlarged. Moderate calcifications within the aortic arch and descending thoracic aorta. Minimal bibasilar horizontal linear chronic scarring is unchanged. No focal airspace opacity to indicate pneumonia. No pleural effusion or pneumothorax. Moderate multilevel degenerative disc changes of the thoracic spine. IMPRESSION: Mild stable cardiomegaly. No acute lung process. Electronically Signed   By: Yvonne Kendall M.D.   On: 12/29/2021 19:19   DG Hand Complete Left  Result Date: 12/29/2021 CLINICAL DATA:  Fall.  Bruising to posterior left hand. EXAM: LEFT HAND - COMPLETE 3+ VIEW COMPARISON:  None Available. FINDINGS: There is diffuse decreased bone mineralization. A pulse oximeter overlies the distal index finger obscuring portions. Degenerative changes including joint space narrowing, subchondral sclerosis and peripheral osteophytosis are moderate at the triscaphe joint; moderate to severe at the thumb carpometacarpal, thumb metacarpophalangeal, and thumb interphalangeal joints; and moderate to severe at the DIP joints and moderate at the PIP joints of the second through fifth fingers. No acute fracture is seen. No dislocation. IMPRESSION: Osteoarthritis as above. No acute fracture. Electronically Signed   By: Yvonne Kendall M.D.   On: 12/29/2021 18:12    Pending Labs Unresulted Labs (From admission, onward)     Start     Ordered   12/31/21 0500  CBC  Tomorrow morning,   R        12/30/21 0924   12/31/21 4782  Basic metabolic panel  Tomorrow morning,   R        12/30/21 0924   12/29/21 1714  Urinalysis, Routine w reflex microscopic Urine, Clean Catch  Once,   URGENT        12/29/21 1713            Vitals/Pain Today's Vitals    12/30/21 0445 12/30/21 0500 12/30/21 0515 12/30/21 0803  BP: 138/77 (!) 160/87 (!) 156/80 140/71  Pulse: 63 81 74 (!) 58  Resp: 13 (!) 22 19 14   Temp:      TempSrc:      SpO2: 100% 99% 99% 98%  PainSc:        Isolation Precautions No active isolations  Medications Medications  HYDROcodone-acetaminophen (NORCO) 7.5-325 MG per tablet 1 tablet (has no administration in time range)  tamsulosin (FLOMAX) capsule 0.4 mg (0.4 mg Oral Not Given 12/29/21 2049)  0.9 %  sodium chloride infusion ( Intravenous Restarted 12/30/21 0804)  acetaminophen (TYLENOL) tablet 650 mg ( Oral See Alternative 12/29/21 2208)    Or  acetaminophen (TYLENOL) 160 MG/5ML solution 650 mg ( Per Tube See Alternative 12/29/21 2208)    Or  acetaminophen (TYLENOL) suppository 650 mg (650 mg Rectal Given 12/29/21 2208)  fentaNYL (SUBLIMAZE) injection 25 mcg (has no  administration in time range)  clopidogrel (PLAVIX) tablet 300 mg (300 mg Oral Not Given 12/30/21 0801)    Followed by  clopidogrel (PLAVIX) tablet 75 mg (has no administration in time range)  aspirin EC tablet 325 mg (has no administration in time range)  rosuvastatin (CRESTOR) tablet 20 mg (has no administration in time range)  labetalol (NORMODYNE) injection 10 mg (has no administration in time range)  sodium chloride flush (NS) 0.9 % injection 3 mL (3 mLs Intravenous Given 12/29/21 1717)   stroke: early stages of recovery book ( Does not apply Given 12/30/21 0334)    Mobility non-ambulatory High fall risk   Focused Assessments    R Recommendations: See Admitting Provider Note  Report given to:   Additional Notes:

## 2021-12-30 NOTE — ED Notes (Signed)
Pt transported to MRI 

## 2021-12-30 NOTE — ED Notes (Signed)
Pt at bedside

## 2021-12-30 NOTE — Evaluation (Addendum)
Clinical/Bedside Swallow Evaluation Patient Details  Name: Chad [name] MRN: [id number] Date of Birth: 21-Jul-1932  Today's Date: 12/30/2021 Time: SLP Start Time (ACUTE ONLY): 1100 SLP Stop Time (ACUTE ONLY): 1115 SLP Time Calculation (min) (ACUTE ONLY): 15 min  Past Medical History:  Past Medical History:  Diagnosis Date   Arthritis    Heart murmur    asa child    Hypertension    Past Surgical History:  Past Surgical History:  Procedure Laterality Date   EYE SURGERY Bilateral 2022   TONSILLECTOMY     TOTAL KNEE ARTHROPLASTY Right 03/30/2013   Procedure: RIGHT TOTAL KNEE ARTHROPLASTY;  Surgeon: Tobi Bastos, MD;  Location: [facility];  Service: Orthopedics;  Laterality: Right;   TOTAL KNEE ARTHROPLASTY Left 10/09/2020   Procedure: TOTAL KNEE ARTHROPLASTY;  Surgeon: Paralee Cancel, MD;  Location: [facility];  Service: Orthopedics;  Laterality: Left;  70 mins   HPI:    88 yo male presenting to the ED on 5/28 with L sided weakness, facial droop, slurred speech, and fall. 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 PMH including hypothyroidism chronic pain syndromes, CKD, HTN, arthritis, bil TKA.    Assessment / Plan / Recommendation  Clinical Impression  Pt presents with a neurogenic dysphagia s/p right CVA.  Left focal CN involvement of CN V, VII, X, XII impacting sensory and motor function.  Pt demonstrated decreased bolus control and awareness with consistent spillage and retention left side.  There were concerns for s/s of aspiration when consuming thin, nectar and honey-thick liquids.  His daughter, April, was at bedside. We discussed concerns related to dysphagia, plans for MBS next date, and recs to start a conservative diet today of dysphagia 1 with PUDDING thick liquids; crush meds. D/W RN as well. SLP will follow. Please provide full supervision with meals. Hold tray if coughing becomes problematic. SLP Visit Diagnosis:  Dysphagia, oropharyngeal phase (R13.12)    Aspiration Risk    tba   Diet Recommendation   Dysphagia 1/ pudding thick liquids  Medication Administration: Crushed with puree    Other  Recommendations Oral Care Recommendations: Oral care BID Other Recommendations: Order thickener from pharmacy    Recommendations for follow up therapy are one component of a multi-disciplinary discharge planning process, led by the attending physician.  Recommendations may be updated based on patient status, additional functional criteria and insurance authorization.  Follow up Recommendations Acute inpatient rehab (3hours/day)      Assistance Recommended at Discharge Frequent or constant Supervision/Assistance  Functional Status Assessment Patient has had a recent decline in their functional status and demonstrates the ability to make significant improvements in function in a reasonable and predictable amount of time.  Frequency and Duration min 3x week  2 weeks       Prognosis Prognosis for Safe Diet Advancement: Good      Swallow Study   General Date of Onset: 12/29/21 Type of Study: Bedside Swallow Evaluation Previous Swallow Assessment: no Diet Prior to this Study: NPO Temperature Spikes Noted: No Respiratory Status: Room air History of Recent Intubation: No Behavior/Cognition: Alert;Cooperative;Pleasant mood Oral Cavity Assessment: Excessive secretions Oral Care Completed by SLP: Yes Oral Cavity - Dentition: Dentures, top;Dentures, bottom Self-Feeding Abilities: Needs assist Patient Positioning: Upright in bed Baseline Vocal Quality: Wet Volitional Cough: Strong Volitional Swallow: Able to elicit    Oral/Motor/Sensory Function Overall Oral Motor/Sensory Function: Moderate impairment Facial ROM: Reduced left;Suspected CN VII (facial) dysfunction Facial Symmetry: Abnormal symmetry  left;Suspected CN VII (facial) dysfunction Facial Strength: Reduced left;Suspected CN VII (facial)  dysfunction Facial Sensation: Reduced left;Suspected CN V (Trigeminal) dysfunction Lingual ROM: Reduced left;Suspected CN XII (hypoglossal) dysfunction Lingual Symmetry: Abnormal symmetry left;Suspected CN XII (hypoglossal) dysfunction Lingual Strength: Reduced   Ice Chips Ice chips: Within functional limits   Thin Liquid Thin Liquid: Impaired Presentation: Cup;Straw Oral Phase Functional Implications: Left anterior spillage Pharyngeal  Phase Impairments: Cough - Delayed;Cough - Immediate    Nectar Thick Nectar Thick Liquid: Impaired Presentation: Cup Oral phase functional implications: Left anterior spillage Pharyngeal Phase Impairments: Cough - Delayed   Honey Thick Honey Thick Liquid: Impaired Presentation: Cup Oral Phase Functional Implications: Left anterior spillage Pharyngeal Phase Impairments: Cough - Delayed   Puree Puree: Impaired Presentation: Self Fed Oral Phase Impairments: Reduced labial seal Oral Phase Functional Implications: Left anterior spillage Pharyngeal Phase Impairments: Suspected delayed Swallow   Solid     Solid: Not tested      Juan Quam Laurice 12/30/2021,12:14 PM  Estill Bamberg L. Tivis Ringer, [place] Office number [phone] Pager [phone]

## 2021-12-30 NOTE — Progress Notes (Signed)
Inpatient Rehab Admissions Coordinator:  ° °Per therapy recommendations patient was screened for CIR candidacy by Thoren Hosang, MS, CCC-SLP. At this time, Pt. Appears to be a a potential candidate for CIR. I will place   order for rehab consult per protocol for full assessment. Please contact me any with questions. ° °Idaly Verret, MS, CCC-SLP °Rehab Admissions Coordinator  °[phone] (celll) °[phone] (office) ° °

## 2021-12-30 NOTE — Progress Notes (Signed)
Carotid artery duplex completed. Refer to "CV Proc" under chart review to view preliminary results.  12/30/2021 10:26 AM Kelby Aline., MHA, RVT, RDCS, RDMS

## 2021-12-30 NOTE — Evaluation (Signed)
Occupational Therapy Evaluation Patient Details Name: Chad [name] MRN: [id number] DOB: 11-06-1931 Today's Date: 12/30/2021   History of Present Illness 88 yo male presenting to the ED on 5/28 with L sided weakness, facial droop, slurred speech, and fall. 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 PMH including hypothyroidism chronic pain syndromes, CKD, HTN, arthritis, bil TKA.   Clinical Impression   PTA, pt was living alone and was independent with ADLs and IADLs; using a cane for functional mobility. Pt currently requiring Mod A for UB ADLs, Max A for LB ADLs, and Mod A for functional mobility. Pt presenting with decreased functional use of LUE, balance, cognition, vision, and safety. Pt demonstrating high motivation to participate in therapy and has good family support. Pt would benefit from further acute OT to facilitate safe dc. Recommend dc to AIR for further OT to optimize safety, independence with ADLs, and return to PLOF.      Recommendations for follow up therapy are one component of a multi-disciplinary discharge planning process, led by the attending physician.  Recommendations may be updated based on patient status, additional functional criteria and insurance authorization.   Follow Up Recommendations  Acute inpatient rehab (3hours/day)    Assistance Recommended at Discharge Frequent or constant Supervision/Assistance  Patient can return home with the following Two people to help with bathing/dressing/bathroom;Two people to help with walking and/or transfers;Direct supervision/assist for medications management;Direct supervision/assist for financial management;Assist for transportation;Assistance with cooking/housework    Functional Status Assessment  Patient has had a recent decline in their functional status and demonstrates the ability to make significant improvements in function in a reasonable and predictable  amount of time.  Equipment Recommendations  None recommended by OT    Recommendations for Other Services       Precautions / Restrictions Precautions Precautions: Fall Restrictions Weight Bearing Restrictions: No      Mobility Bed Mobility Overal bed mobility: Needs Assistance Bed Mobility: Supine to Sit, Sit to Supine     Supine to sit: Mod assist, +2 for safety/equipment Sit to supine: Mod assist, +2 for safety/equipment        Transfers Overall transfer level: Needs assistance Equipment used: None Transfers: Sit to/from Stand Sit to Stand: Min assist                  Balance Overall balance assessment: Needs assistance Sitting-balance support: No upper extremity supported, Feet supported Sitting balance-Leahy Scale: Fair     Standing balance support: Single extremity supported, During functional activity Standing balance-Leahy Scale: Poor                             ADL either performed or assessed with clinical judgement   ADL Overall ADL's : Needs assistance/impaired Eating/Feeding: Moderate assistance;Bed level   Grooming: Moderate assistance;Bed level   Upper Body Bathing: Moderate assistance;Sitting   Lower Body Bathing: Maximal assistance;Sit to/from stand   Upper Body Dressing : Moderate assistance;Sitting   Lower Body Dressing: Maximal assistance;Sit to/from stand Lower Body Dressing Details (indicate cue type and reason): donning socks at EOB Toilet Transfer: Moderate assistance Toilet Transfer Details (indicate cue type and reason): simulated with side steps towards [facility]         Functional mobility during ADLs: Moderate assistance (side steps towards HOB) General ADL Comments: Pt very motivated. Pt presenting with decreased functional use of L side impacting his functional performance.  Vision Baseline Vision/History: 1 Wears glasses Vision Assessment?: Yes Eye Alignment: Within Functional Limits Ocular Range of  Motion: Restricted on the left Alignment/Gaze Preference: Within Defined Limits Tracking/Visual Pursuits: Decreased smoothness of horizontal tracking;Decreased smoothness of vertical tracking;Requires cues, head turns, or add eye shifts to track;Unable to hold eye position out of midline (Poor tracking to L) Visual Fields: Left visual field deficit Additional Comments: Poor tracking wiht decreased smooth movement. Possibl;e field cut on L; unable to see object from back untill at ~30* from midline (coming from L).     Perception     Praxis      Pertinent Vitals/Pain Pain Assessment Pain Assessment: Faces Faces Pain Scale: No hurt Pain Intervention(s): Monitored during session     Hand Dominance Right   Extremity/Trunk Assessment Upper Extremity Assessment Upper Extremity Assessment: LUE deficits/detail LUE Deficits / Details: no initation of active movement. Tone noted with flexion at elbow and digits. Able to achieve extension of digits. Elbow extension to 120. Decreased sensation LUE Sensation: decreased light touch;decreased proprioception LUE Coordination: decreased gross motor;decreased fine motor   Lower Extremity Assessment Lower Extremity Assessment: Defer to PT evaluation   Cervical / Trunk Assessment Cervical / Trunk Assessment: Kyphotic   Communication Communication Communication: HOH   Cognition Arousal/Alertness: Awake/alert Behavior During Therapy: WFL for tasks assessed/performed Overall Cognitive Status: Impaired/Different from baseline Area of Impairment: Attention, Memory, Following commands, Safety/judgement, Awareness, Problem solving                   Current Attention Level: Sustained (Short duration) Memory: Decreased short-term memory Following Commands: Follows one step commands with increased time, Follows one step commands inconsistently Safety/Judgement: Decreased awareness of deficits, Decreased awareness of safety Awareness:  Intellectual Problem Solving: Slow processing, Requires verbal cues General Comments: Pt requiring increased time throughout session. Very pelasant and agreable to therapy. Decreased initation and benefiting from tactile cues. Poor problem solving and recall duirng vision assessment.     General Comments  Daughter, April, present. VSS on RA.    Exercises     Shoulder Instructions      Home Living Family/patient expects to be discharged to:: Private residence Living Arrangements: Alone Available Help at Discharge: Family;Available 24 hours/day Type of Home: Apartment Home Access: Level entry     Home Layout: One level     Bathroom Shower/Tub: Teacher, early years/pre: Standard     Home Equipment: Conservation officer, nature (2 wheels);Cane - single point;Toilet riser;BSC/3in1          Prior Functioning/Environment Prior Level of Function : Independent/Modified Independent             Mobility Comments: Uses SPC ADLs Comments: Pt performs ADLs and IADLs. Daughter drives. Unsure on medication management        OT Problem List: Decreased strength;Decreased range of motion;Decreased activity tolerance;Impaired balance (sitting and/or standing);Decreased knowledge of use of DME or AE;Decreased knowledge of precautions      OT Treatment/Interventions: Self-care/ADL training;Therapeutic exercise;Energy conservation;DME and/or AE instruction;Therapeutic activities;Patient/family education    OT Goals(Current goals can be found in the care plan section) Acute Rehab OT Goals Patient Stated Goal: Go home OT Goal Formulation: With patient/family Time For Goal Achievement: 01/13/22 Potential to Achieve Goals: Good  OT Frequency: Min 2X/week    Co-evaluation              AM-PAC OT "6 Clicks" Daily Activity     Outcome Measure Help from another person eating meals?: A Lot Help from  another person taking care of personal grooming?: A Lot Help from another person  toileting, which includes using toliet, bedpan, or urinal?: A Lot Help from another person bathing (including washing, rinsing, drying)?: A Lot Help from another person to put on and taking off regular upper body clothing?: A Lot Help from another person to put on and taking off regular lower body clothing?: A Lot 6 Click Score: 12   End of Session Equipment Utilized During Treatment: Gait belt Nurse Communication: Mobility status  Activity Tolerance: Patient tolerated treatment well Patient left: in bed;with call bell/phone within reach;with family/visitor present  OT Visit Diagnosis: Unsteadiness on feet (R26.81);Other abnormalities of gait and mobility (R26.89);Muscle weakness (generalized) (M62.81)                Time: 5258-9483 OT Time Calculation (min): 23 min Charges:  OT General Charges $OT Visit: 1 Visit OT Evaluation $OT Eval Moderate Complexity: 1 Mod OT Treatments $Self Care/Home Management : 8-22 mins  Ladonne Sharples MSOT, OTR/L Acute Rehab Office: [place] 12/30/2021, 9:12 AM

## 2021-12-30 NOTE — ED Notes (Signed)
OT at bedside. 

## 2021-12-30 NOTE — Progress Notes (Signed)
PROGRESS NOTE  Chad [name]  [id number] DOB: [birth year] DOA: 12/29/2021 PCP: [name], MD   Brief Narrative: Patient is a 88 year old male with history of hypothyroidism, chronic pain syndrome, CKD stage IIIa hypertension, arthritis who presented with a fall from home.  Patient lives alone.  Patient was also found to have left-sided weakness, left-sided facial droop, slurred speech.  CT head showed age-indeterminate lacunar infarct within the right basal ganglia.  Stroke suspected and was admitted.  Neurology consulted.  MRI showed right MCA distribution infarcts.  Stroke work-up initiated.   Assessment & Plan:  Principal Problem:   Stroke (cerebrum) (HCC) Active Problems:   CKD (chronic kidney disease) stage 3, GFR 30-59 ml/min (HCC)   Anemia   Hyperlipidemia   Essential hypertension  Acute ischemic stroke: Presented with fall.  Found to have left-sided weakness.  Neurology following.  MRI showed cluster of small cortical and white matter infarcts within the right MCA distribution.Intracranial MRA showed focal high-grade narrowing at the right M2 level . Initiated stroke work-up.  PT/OT/speech evaluation.  Echocardiogram, carotid Doppler ordered.  Hemoglobin A1c 6.1.  LDL 90.  Started on a statin, aspirin, Plavix. Currently he is alert oriented.  At baseline, he lives alone, can walk, cooks his own food.  Hypertension: Allow permissive hypertension for now.  Continue.  Medications for severe hypertension.  CKD stage IIIa: Currently kidney.  Baseline.  Hyperlipidemia: Continue Lipitor  Arthritis: Continue supportive care.  PT/OT evaluation consulted          DVT prophylaxis:SCD's Start: 12/29/21 2003     Code Status: Full Code  Family Communication: Daughter at bedside  Patient status:Inpatient  Patient is from :Home  Anticipated discharge to:SNF  Estimated DC date:Not sure, needs full stroke work-up   Consultants: Neurology  Procedures:None  yet  Antimicrobials:  Anti-infectives (From admission, onward)    None       Subjective: Patient seen and examined at the bedside this morning.  Hemodynamically stable.  Daughter at bedside.  He was lying on bed.  Currently alert, awake, oriented, not agitated.  Has severe weakness on the left upper and lower extremity left-sided facial droop.  Objective: Vitals:   12/30/21 0445 12/30/21 0500 12/30/21 0515 12/30/21 0803  BP: 138/77 (!) 160/87 (!) 156/80 140/71  Pulse: 63 81 74 (!) 58  Resp: 13 (!) 22 19 14   Temp:      TempSrc:      SpO2: 100% 99% 99% 98%    Intake/Output Summary (Last 24 hours) at 12/30/2021 0917 Last data filed at 12/30/2021 0524 Gross per 24 hour  Intake 626.13 ml  Output 125 ml  Net 501.13 ml   There were no vitals filed for this visit.  Examination:  General exam: Overall comfortable, not in distress, deconditioned elderly male HEENT: PERRL.left facial droop Respiratory system:  no wheezes or crackles  Cardiovascular system: S1 & S2 heard, RRR.  Gastrointestinal system: Abdomen is nondistended, soft and nontender. Central nervous system: Alert and oriented, weakness on the left upper and lower extremity with motor of 2/5, strength of 5/5 in all the extremities.  Left facial droop Extremities: No edema, no clubbing ,no cyanosis Skin: No rashes, no ulcers,no icterus     Data Reviewed: I have personally reviewed following labs and imaging studies  CBC: Recent Labs  Lab 12/29/21 1700 12/29/21 1722  WBC 6.9  --   NEUTROABS 3.8  --   HGB 11.6* 11.6*  HCT 35.6* 34.0*  MCV 86.2  --  PLT 197  --    Basic Metabolic Panel: Recent Labs  Lab 12/29/21 1700 12/29/21 1722  NA 140 141  K 4.4 4.4  CL 108 109  CO2 22  --   GLUCOSE 119* 118*  BUN 35* 36*  CREATININE 1.46* 1.40*  CALCIUM 9.9  --   MG 1.9  --   PHOS 3.5  --      No results found for this or any previous visit (from the past 240 hour(s)).   Radiology Studies: DG Elbow  Complete Left  Result Date: 12/29/2021 CLINICAL DATA:  Fall.  Left elbow pain and bruising. EXAM: LEFT ELBOW - COMPLETE 3+ VIEW COMPARISON:  None Available. FINDINGS: No fracture or dislocation. Narrowed radiocapitellar joint. Marginal osteophytes project from the base of the radial head. No joint effusion. There is soft tissue edema posteriorly. IMPRESSION: No fracture or dislocation. Electronically Signed   By: Lajean Manes M.D.   On: 12/29/2021 18:12   CT HEAD WO CONTRAST  Result Date: 12/29/2021 CLINICAL DATA:  Neurologic deficit EXAM: CT HEAD WITHOUT CONTRAST TECHNIQUE: Contiguous axial images were obtained from the base of the skull through the vertex without intravenous contrast. RADIATION DOSE REDUCTION: This exam was performed according to the departmental dose-optimization program which includes automated exposure control, adjustment of the mA and/or kV according to patient size and/or use of iterative reconstruction technique. COMPARISON:  None Available. FINDINGS: Brain: Confluent hypodensities throughout the periventricular white matter are most consistent with age-indeterminate small vessel ischemic changes, likely chronic. Focal hypodensities in the left basal ganglia and right frontal periventricular white matter consistent with chronic lacunar infarcts. Age-indeterminate lacunar infarct is seen within the right basal ganglia image 18/3. No other signs of acute infarct or hemorrhage. Lateral ventricles and midline structures are otherwise unremarkable. No acute extra-axial fluid collections. No mass effect. Vascular: No hyperdense vessel or unexpected calcification. Skull: Normal. Negative for fracture or focal lesion. Sinuses/Orbits: No acute finding. Other: None. IMPRESSION: 1. Age indeterminate lacunar infarct within the right basal ganglia. 2. Age-indeterminate small-vessel ischemic changes throughout the periventricular white matter, favor chronic. 3. Chronic left basal ganglia and right  frontal white matter lacunar infarcts as above. 4. No acute hemorrhage. Electronically Signed   By: Randa Ngo M.D.   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 Multiplanar CT image reconstructions were also generated. RADIATION DOSE REDUCTION: This exam was performed according to the departmental dose-optimization program which includes automated exposure control, adjustment of the mA and/or kV according to patient size and/or use of iterative reconstruction technique. COMPARISON:  None Available. FINDINGS: Alignment: Mild degenerative anterolisthesis of C2 and C3. Otherwise alignment is anatomic. Skull base and vertebrae: No acute fracture. No primary bone lesion or focal pathologic process. Soft tissues and spinal canal: No prevertebral fluid or swelling. No visible canal hematoma. Disc levels: Partial bony fusion across the disc spaces at C3-4 and C4-5. There is severe multilevel spondylosis throughout the remainder of the cervical spine, greatest at C5-6 and C6-7. Diffuse facet hypertrophy. Upper chest: Airway is patent. Lung apices are clear. Prominent atherosclerosis of the aortic arch. Other: Reconstructed images demonstrate no additional findings. IMPRESSION: 1. Extensive multilevel cervical degenerative changes. No acute fracture. Electronically Signed   By: Randa Ngo M.D.   On: 12/29/2021 17:38   MR ANGIO  HEAD WO CONTRAST  Result Date: 12/30/2021 CLINICAL DATA:  There are deficit with acute stroke suspected EXAM: MRI HEAD WITHOUT CONTRAST MRA HEAD WITHOUT CONTRAST TECHNIQUE: Multiplanar, multi-echo pulse sequences of the brain and surrounding structures were acquired without intravenous contrast. Angiographic images of the Circle of Willis were acquired using MRA  technique without intravenous contrast. COMPARISON:  Head CT from yesterday FINDINGS: MRI HEAD FINDINGS Brain: Cluster of small acute infarcts in the right insular cortex, right frontal cortex, and deep right cerebral white matter. These are in a MCA distribution. Background of advanced chronic small vessel ischemia with confluent gliosis in the hemispheric white matter. Chronic lacunar infarcts in the deep cerebrum. Small remote left cerebral infarct. Gradient signal at the lower right sylvian fissure with there is calcification along the MCA branches by CT. Vascular: Major flow voids are preserved Skull and upper cervical spine: Normal marrow signal Sinuses/Orbits: Bilateral cataract resection MRA HEAD FINDINGS Anterior circulation: High-grade narrowing involving the upper right M2 branch with there is a gradient hypointensity noted above. Elsewhere vessels are smoothly contoured and widely patent. Posterior circulation: Vertebrobasilar arteries are smoothly contoured and widely patent. IMPRESSION: Brain MRI: 1. Cluster of small cortical and white matter infarcts within the right MCA distribution. 2. Background of advanced chronic small vessel ischemia. Intracranial MRA: Focal high-grade narrowing at the right M2 level where there is calcification by CT. This could reflect calcified plaque or a calcified embolus. If an embolus, the proximal nature and degree of mild infarct with suggest additional territory of risk. Electronically Signed   By: Jorje Guild M.D.   On: 12/30/2021 07:53   MR BRAIN WO CONTRAST  Result Date: 12/30/2021 CLINICAL DATA:  There are deficit with acute stroke suspected EXAM: MRI HEAD WITHOUT CONTRAST MRA HEAD WITHOUT CONTRAST TECHNIQUE: Multiplanar, multi-echo pulse sequences of the brain and surrounding structures were acquired without intravenous contrast. Angiographic images of the Circle of Willis were acquired using MRA technique without intravenous contrast. COMPARISON:  Head CT  from yesterday FINDINGS: MRI HEAD FINDINGS Brain: Cluster of small acute infarcts in the right insular cortex, right frontal cortex, and deep right cerebral white matter. These are in a MCA distribution. Background of advanced chronic small vessel ischemia with confluent gliosis in the hemispheric white matter. Chronic lacunar infarcts in the deep cerebrum. Small remote left cerebral infarct. Gradient signal at the lower right sylvian fissure with there is calcification along the MCA branches by CT. Vascular: Major flow voids are preserved Skull and upper cervical spine: Normal marrow signal Sinuses/Orbits: Bilateral cataract resection MRA HEAD FINDINGS Anterior circulation: High-grade narrowing involving the upper right M2 branch with there is a gradient hypointensity noted above. Elsewhere vessels are smoothly contoured and widely patent. Posterior circulation: Vertebrobasilar arteries are smoothly contoured and widely patent. IMPRESSION: Brain MRI: 1. Cluster of small cortical and white matter infarcts within the right MCA distribution. 2. Background of advanced chronic small vessel ischemia. Intracranial MRA: Focal high-grade narrowing at the right M2 level where there is calcification by CT. This could reflect calcified plaque or a calcified embolus. If an embolus, the proximal nature and degree of mild infarct with suggest additional territory of risk. Electronically Signed   By: Jorje Guild M.D.   On: 12/30/2021 07:53   DG Chest Portable 1 View  Result Date: 12/29/2021 CLINICAL DATA:  CVA. Fell last night. Family concerned patient injured left side EXAM: PORTABLE CHEST 1 VIEW COMPARISON:  Chest two views 03/23/2013 FINDINGS: Cardiac silhouette is mildly enlarged. Moderate calcifications  within the aortic arch and descending thoracic aorta. Minimal bibasilar horizontal linear chronic scarring is unchanged. No focal airspace opacity to indicate pneumonia. No pleural effusion or pneumothorax. Moderate  multilevel degenerative disc changes of the thoracic spine. IMPRESSION: Mild stable cardiomegaly. No acute lung process. Electronically Signed   By: Yvonne Kendall M.D.   On: 12/29/2021 19:19   DG Hand Complete Left  Result Date: 12/29/2021 CLINICAL DATA:  Fall.  Bruising to posterior left hand. EXAM: LEFT HAND - COMPLETE 3+ VIEW COMPARISON:  None Available. FINDINGS: There is diffuse decreased bone mineralization. A pulse oximeter overlies the distal index finger obscuring portions. Degenerative changes including joint space narrowing, subchondral sclerosis and peripheral osteophytosis are moderate at the triscaphe joint; moderate to severe at the thumb carpometacarpal, thumb metacarpophalangeal, and thumb interphalangeal joints; and moderate to severe at the DIP joints and moderate at the PIP joints of the second through fifth fingers. No acute fracture is seen. No dislocation. IMPRESSION: Osteoarthritis as above. No acute fracture. Electronically Signed   By: Yvonne Kendall M.D.   On: 12/29/2021 18:12    Scheduled Meds:  aspirin EC  325 mg Oral Daily   clopidogrel  300 mg Oral Once   Followed by   Derrill Memo ON 12/31/2021] clopidogrel  75 mg Oral Daily   rosuvastatin  20 mg Oral Daily   tamsulosin  0.4 mg Oral Daily   Continuous Infusions:  sodium chloride 75 mL/hr at 12/30/21 0804     LOS: 1 day   Shelly Coss, MD [facility] P5/29/2023, 9:17 AM

## 2021-12-30 NOTE — Progress Notes (Addendum)
STROKE TEAM PROGRESS NOTE   SUBJECTIVE (INTERVAL HISTORY) His daughter is at the bedside.  Overall his condition is unchanged. He still has left facial droop, and left hemiparesis. CT showed no bleeding but sylvian fissure calcified plaque. MRI showed right MCA scattered infarcts and MRA concerning for right M2 stenosis. Will do CTA head and neck to evaluate right M2 stenosis due to calcified plaque.   OBJECTIVE Temp:  [97.8 F (36.6 C)] 97.8 F (36.6 C) (05/28 1646) Pulse Rate:  [57-81] 58 (05/29 0803) Cardiac Rhythm: Normal sinus rhythm (05/29 0806) Resp:  [13-22] 14 (05/29 0803) BP: (120-168)/(56-95) 140/71 (05/29 0803) SpO2:  [97 %-100 %] 98 % (05/29 0803)  Recent Labs  Lab 12/29/21 1658  GLUCAP 112*   Recent Labs  Lab 12/29/21 1700 12/29/21 1722  NA 140 141  K 4.4 4.4  CL 108 109  CO2 22  --   GLUCOSE 119* 118*  BUN 35* 36*  CREATININE 1.46* 1.40*  CALCIUM 9.9  --   MG 1.9  --   PHOS 3.5  --    Recent Labs  Lab 12/29/21 1700  AST 25  ALT 12  ALKPHOS 56  BILITOT 0.8  PROT 7.6  ALBUMIN 3.4*   Recent Labs  Lab 12/29/21 1700 12/29/21 1722  WBC 6.9  --   NEUTROABS 3.8  --   HGB 11.6* 11.6*  HCT 35.6* 34.0*  MCV 86.2  --   PLT 197  --    Recent Labs  Lab 12/29/21 1700 12/30/21 0412  CKTOTAL 328 227   Recent Labs    12/29/21 1700  LABPROT 16.7*  INR 1.4*   Recent Labs    12/29/21 2233  COLORURINE YELLOW  LABSPEC 1.011  PHURINE 5.0  GLUCOSEU NEGATIVE  HGBUR SMALL*  BILIRUBINUR NEGATIVE  KETONESUR NEGATIVE  PROTEINUR 30*  NITRITE NEGATIVE  LEUKOCYTESUR NEGATIVE       Component Value Date/Time   CHOL 149 12/30/2021 0412   TRIG 68 12/30/2021 0412   HDL 45 12/30/2021 0412   CHOLHDL 3.3 12/30/2021 0412   VLDL 14 12/30/2021 0412   LDLCALC 90 12/30/2021 0412   Lab Results  Component Value Date   HGBA1C 6.1 (H) 12/29/2021   No results found for: LABOPIA, COCAINSCRNUR, LABBENZ, AMPHETMU, THCU, LABBARB  No results for input(s): ETH  in the last 168 hours.  I have personally reviewed the radiological images below and agree with the radiology interpretations.  DG Elbow Complete Left  Result Date: 12/29/2021 CLINICAL DATA:  Fall.  Left elbow pain and bruising. EXAM: LEFT ELBOW - COMPLETE 3+ VIEW COMPARISON:  None Available. FINDINGS: No fracture or dislocation. Narrowed radiocapitellar joint. Marginal osteophytes project from the base of the radial head. No joint effusion. There is soft tissue edema posteriorly. IMPRESSION: No fracture or dislocation. Electronically Signed   By: Lajean Manes M.D.   On: 12/29/2021 18:12   CT HEAD WO CONTRAST  Result Date: 12/29/2021 CLINICAL DATA:  Neurologic deficit EXAM: CT HEAD WITHOUT CONTRAST TECHNIQUE: Contiguous axial images were obtained from the base of the skull through the vertex without intravenous contrast. RADIATION DOSE REDUCTION: This exam was performed according to the departmental dose-optimization program which includes automated exposure control, adjustment of the mA and/or kV according to patient size and/or use of iterative reconstruction technique. COMPARISON:  None Available. FINDINGS: Brain: Confluent hypodensities throughout the periventricular white matter are most consistent with age-indeterminate small vessel ischemic changes, likely chronic. Focal hypodensities in the left basal ganglia and right frontal  periventricular white matter consistent with chronic lacunar infarcts. Age-indeterminate lacunar infarct is seen within the right basal ganglia image 18/3. No other signs of acute infarct or hemorrhage. Lateral ventricles and midline structures are otherwise unremarkable. No acute extra-axial fluid collections. No mass effect. Vascular: No hyperdense vessel or unexpected calcification. Skull: Normal. Negative for fracture or focal lesion. Sinuses/Orbits: No acute finding. Other: None. IMPRESSION: 1. Age indeterminate lacunar infarct within the right basal ganglia. 2.  Age-indeterminate small-vessel ischemic changes throughout the periventricular white matter, favor chronic. 3. Chronic left basal ganglia and right frontal white matter lacunar infarcts as above. 4. No acute hemorrhage. Electronically Signed   By: Randa Ngo M.D.   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 Multiplanar CT image reconstructions were also generated. RADIATION DOSE REDUCTION: This exam was performed according to the departmental dose-optimization program which includes automated exposure control, adjustment of the mA and/or kV according to patient size and/or use of iterative reconstruction technique. COMPARISON:  None Available. FINDINGS: Alignment: Mild degenerative anterolisthesis of C2 and C3. Otherwise alignment is anatomic. Skull base and vertebrae: No acute fracture. No primary bone lesion or focal pathologic process. Soft tissues and spinal canal: No prevertebral fluid or swelling. No visible canal hematoma. Disc levels: Partial bony fusion across the disc spaces at C3-4 and C4-5. There is severe multilevel spondylosis throughout the remainder of the cervical spine, greatest at C5-6 and C6-7. Diffuse facet hypertrophy. Upper chest: Airway is patent. Lung apices are clear. Prominent atherosclerosis of the aortic arch. Other: Reconstructed images demonstrate no additional findings. IMPRESSION: 1. Extensive multilevel cervical degenerative changes. No acute fracture. Electronically Signed   By: Randa Ngo M.D.   On: 12/29/2021 17:38   MR ANGIO HEAD WO CONTRAST  Result Date: 12/30/2021 CLINICAL DATA:  There are deficit with acute stroke suspected EXAM: MRI HEAD WITHOUT CONTRAST MRA HEAD WITHOUT CONTRAST TECHNIQUE: Multiplanar, multi-echo pulse sequences of  the brain and surrounding structures were acquired without intravenous contrast. Angiographic images of the Circle of Willis were acquired using MRA technique without intravenous contrast. COMPARISON:  Head CT from yesterday FINDINGS: MRI HEAD FINDINGS Brain: Cluster of small acute infarcts in the right insular cortex, right frontal cortex, and deep right cerebral white matter. These are in a MCA distribution. Background of advanced chronic small vessel ischemia with confluent gliosis in the hemispheric white matter. Chronic lacunar infarcts in the deep cerebrum. Small remote left cerebral infarct. Gradient signal at the lower right sylvian fissure with there is calcification along the MCA branches by CT. Vascular: Major flow voids are preserved Skull and upper cervical spine: Normal marrow signal Sinuses/Orbits: Bilateral cataract resection MRA HEAD FINDINGS Anterior circulation: High-grade narrowing involving the upper right M2 branch with there is a gradient hypointensity noted above. Elsewhere vessels are smoothly contoured and widely patent. Posterior circulation: Vertebrobasilar arteries are smoothly contoured and widely patent. IMPRESSION: Brain MRI: 1. Cluster of small cortical and white matter infarcts within the right MCA distribution. 2. Background of advanced chronic small vessel ischemia. Intracranial MRA: Focal high-grade narrowing at the right M2 level where there is calcification by CT. This could reflect calcified plaque or a calcified embolus. If an embolus, the proximal nature and degree of mild infarct with suggest additional territory of risk. Electronically Signed   By: Roderic Palau  Watts M.D.   On: 12/30/2021 07:53   MR BRAIN WO CONTRAST  Result Date: 12/30/2021 CLINICAL DATA:  There are deficit with acute stroke suspected EXAM: MRI HEAD WITHOUT CONTRAST MRA HEAD WITHOUT CONTRAST TECHNIQUE: Multiplanar, multi-echo pulse sequences of the brain and surrounding structures were acquired without  intravenous contrast. Angiographic images of the Circle of Willis were acquired using MRA technique without intravenous contrast. COMPARISON:  Head CT from yesterday FINDINGS: MRI HEAD FINDINGS Brain: Cluster of small acute infarcts in the right insular cortex, right frontal cortex, and deep right cerebral white matter. These are in a MCA distribution. Background of advanced chronic small vessel ischemia with confluent gliosis in the hemispheric white matter. Chronic lacunar infarcts in the deep cerebrum. Small remote left cerebral infarct. Gradient signal at the lower right sylvian fissure with there is calcification along the MCA branches by CT. Vascular: Major flow voids are preserved Skull and upper cervical spine: Normal marrow signal Sinuses/Orbits: Bilateral cataract resection MRA HEAD FINDINGS Anterior circulation: High-grade narrowing involving the upper right M2 branch with there is a gradient hypointensity noted above. Elsewhere vessels are smoothly contoured and widely patent. Posterior circulation: Vertebrobasilar arteries are smoothly contoured and widely patent. IMPRESSION: Brain MRI: 1. Cluster of small cortical and white matter infarcts within the right MCA distribution. 2. Background of advanced chronic small vessel ischemia. Intracranial MRA: Focal high-grade narrowing at the right M2 level where there is calcification by CT. This could reflect calcified plaque or a calcified embolus. If an embolus, the proximal nature and degree of mild infarct with suggest additional territory of risk. Electronically Signed   By: Jorje Guild M.D.   On: 12/30/2021 07:53   DG Chest Portable 1 View  Result Date: 12/29/2021 CLINICAL DATA:  CVA. Fell last night. Family concerned patient injured left side EXAM: PORTABLE CHEST 1 VIEW COMPARISON:  Chest two views 03/23/2013 FINDINGS: Cardiac silhouette is mildly enlarged. Moderate calcifications within the aortic arch and descending thoracic aorta. Minimal  bibasilar horizontal linear chronic scarring is unchanged. No focal airspace opacity to indicate pneumonia. No pleural effusion or pneumothorax. Moderate multilevel degenerative disc changes of the thoracic spine. IMPRESSION: Mild stable cardiomegaly. No acute lung process. Electronically Signed   By: Yvonne Kendall M.D.   On: 12/29/2021 19:19   DG Hand Complete Left  Result Date: 12/29/2021 CLINICAL DATA:  Fall.  Bruising to posterior left hand. EXAM: LEFT HAND - COMPLETE 3+ VIEW COMPARISON:  None Available. FINDINGS: There is diffuse decreased bone mineralization. A pulse oximeter overlies the distal index finger obscuring portions. Degenerative changes including joint space narrowing, subchondral sclerosis and peripheral osteophytosis are moderate at the triscaphe joint; moderate to severe at the thumb carpometacarpal, thumb metacarpophalangeal, and thumb interphalangeal joints; and moderate to severe at the DIP joints and moderate at the PIP joints of the second through fifth fingers. No acute fracture is seen. No dislocation. IMPRESSION: Osteoarthritis as above. No acute fracture. Electronically Signed   By: Yvonne Kendall M.D.   On: 12/29/2021 18:12   VAS US CAROTID  Result Date: 12/30/2021 Carotid Arterial Duplex Study Patient Name:  SYLVAIN HASTEN  Date of Exam:   12/30/2021 Medical Rec #: [id number]          Accession #:    [id number] Date of Birth: Aug 23, 1931           Patient Gender: M Patient Age:   67 years Exam Location:  [facility] Procedure:      VAS US CAROTID Referring  Phys: ANASTASSIA DOUTOVA --------------------------------------------------------------------------------  Indications:       CVA. Risk Factors:      Hypertension. Comparison Study:  No prior study Performing Technologist: Maudry Mayhew MHA, RDMS, RVT, RDCS  Examination Guidelines: A complete evaluation includes B-mode imaging, spectral Doppler, color Doppler, and power Doppler as needed of all accessible portions  of each vessel. Bilateral testing is considered an integral part of a complete examination. Limited examinations for reoccurring indications may be performed as noted.  Right Carotid Findings: +----------+-------+--------+--------+-------------------------------+---------+           PSV    EDV cm/sStenosisPlaque Description             Comments            cm/s                                                            +----------+-------+--------+--------+-------------------------------+---------+ CCA Prox  54     7               heterogenous and irregular               +----------+-------+--------+--------+-------------------------------+---------+ CCA Distal58     9               smooth and heterogenous                  +----------+-------+--------+--------+-------------------------------+---------+ ICA Prox  132    19              smooth, heterogenous and       Shadowing                                  calcific                                 +----------+-------+--------+--------+-------------------------------+---------+ ICA Distal78     15                                                       +----------+-------+--------+--------+-------------------------------+---------+ ECA       126                                                             +----------+-------+--------+--------+-------------------------------+---------+ +----------+--------+-------+----------------+-------------------+           PSV cm/sEDV cmsDescribe        Arm Pressure (mmHG) +----------+--------+-------+----------------+-------------------+ [id number]            Multiphasic, WNL                    +----------+--------+-------+----------------+-------------------+ +---------+--------+--+--------+-+---------+ VertebralPSV cm/s51EDV cm/s5Antegrade +---------+--------+--+--------+-+---------+  Left Carotid Findings:  +----------+--------+--------+--------+------------------------------+---------+           PSV cm/sEDV cm/sStenosisPlaque Description            Comments  +----------+--------+--------+--------+------------------------------+---------+ CCA  Prox  89                      heterogenous and irregular    tortuous  +----------+--------+--------+--------+------------------------------+---------+ CCA Distal70      8                                                       +----------+--------+--------+--------+------------------------------+---------+ ICA Prox  49      12              heterogenous, irregular and   Shadowing                                   calcific                                +----------+--------+--------+--------+------------------------------+---------+ ICA Distal98      18                                                      +----------+--------+--------+--------+------------------------------+---------+ ECA       67      10                                                      +----------+--------+--------+--------+------------------------------+---------+ +----------+--------+--------+----------------+-------------------+           PSV cm/sEDV cm/sDescribe        Arm Pressure (mmHG) +----------+--------+--------+----------------+-------------------+ [id number]             Multiphasic, WNL                    +----------+--------+--------+----------------+-------------------+ +---------+--------+--+--------+--+---------+ VertebralPSV cm/s94EDV cm/s14Antegrade +---------+--------+--+--------+--+---------+   Summary: Right Carotid: Velocities in the right ICA are consistent with a 1-39% stenosis. Left Carotid: Velocities in the left ICA are consistent with a 1-39% stenosis. Vertebrals:  Bilateral vertebral arteries demonstrate antegrade flow. Subclavians: Normal flow hemodynamics were seen in bilateral subclavian              arteries.  *See table(s) above for measurements and observations.     Preliminary      PHYSICAL EXAM  Temp:  [97.8 F (36.6 C)] 97.8 F (36.6 C) (05/28 1646) Pulse Rate:  [57-81] 58 (05/29 0803) Resp:  [13-22] 14 (05/29 0803) BP: (120-168)/(56-95) 140/71 (05/29 0803) SpO2:  [97 %-100 %] 98 % (05/29 0803)  General - Well nourished, well developed, in no apparent distress.  Ophthalmologic - fundi not visualized due to noncooperation.  Cardiovascular - Regular rhythm and rate.  Neuro - awake, alert, eyes open, hard of hearing, orientated to age, place, time and people. No aphasia, paucity of speech, moderate dysarthria, following all simple commands. Able to name and repeat with dysarthric voice. No gaze palsy, tracking bilaterally, visual field full but with possible left simultagnosia, PERRL. Left facial droop. Tongue midline. RUE at least 4/5 and LUE 2-/5 bicep, otherwise 0/5. RLE  4/5, LLE 3/5 proximal and 2/5 distal DF. Sensation symmetrical bilaterally but not cooperative on sensation inattention testing, right FTN intact grossly, gait not tested.     ASSESSMENT/PLAN Mr. TAMARCUS CONDIE is a 88 y.o. male with history of hypertension, hyperlipidemia, hearing loss admitted for left-sided weakness and fell at home. No tPA given due to outside window.    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 MRA right M 2 high-grade stenosis MRI right MCA scattered small infarcts CT head and neck proximal right M2 occlusion due to calcified embolus.  Extensive calcified atherosclerosis at right ICA bifurcation with 80% stenosis.  Severe stenosis nondominant right VA origin.  Carotid Doppler unremarkable 2D Echo EF 55 to 60% LDL 90 HgbA1c 6.1 Lovenox for VTE prophylaxis No antithrombotic prior  to admission, now on aspirin 325 mg daily and clopidogrel 75 mg daily DAPT.  Patient counseled to be compliant with his antithrombotic medications Ongoing aggressive stroke risk factor management Therapy recommendations:  CIR Disposition:  pending  Carotid stenosis CT head and neck extensive calcified atherosclerosis at right ICA bifurcation with 80% stenosis. Likely the source for right M2 occlusion and right MCA stroke Discussed with Dr. Estanislado Pandy, plan for angiogram in a.m. and potential right ICA stenting. On DAPT  Hypertension Stable BP goal 130-160 before carotid revascularization Avoid low BP  Hyperlipidemia Home meds: Lovastatin 40 LDL 90, goal < 70 Now on Crestor 20 Continue statin at discharge  Other Stroke Risk Factors Advanced age  Other Active Problems Hard of hearing Independent lives alone before admission  Hospital day # 1  I had long discussion with daughter at bedside, updated pt current condition, treatment plan and potential prognosis, and answered all the questions.  She expressed understanding and appreciation.  I also discussed with Dr. Estanislado Pandy interventional radiology.   Rosalin Hawking, MD PhD Stroke Neurology 12/30/2021 11:25 AM    To contact Stroke Continuity provider, please refer to [email or web address] After hours, contact General Neurology

## 2021-12-30 NOTE — Consult Note (Signed)
Neurology Consultation Reason for Consult: c/f stroke Requesting Physician: Toy Baker  CC: Left sided weakness, fall   History is obtained from: Patient, daughter at bedside, chart review  HPI: Chad [name] is a 88 y.o. male with past medical history significant for hypertension, hyperlipidemia, chronic pain/arthritis, mild aortic regurgitation and mitral regurgitation, hearing loss.  He was in his usual state of health, living independently, on 5/27 in the evening when he suddenly became weak on the left side when walking and fell.  He was not near his phone and it took significant effort to use his cane to drag himself slowly to his phone to call his daughter for assistance.  Daughter notes that he does needed walker or cane to ambulate at baseline but he does not always use it  LKW: 3 PM on 5/27 tPA given?: No, out of the window Premorbid modified rankin scale:      1 - No significant disability. Able to carry out all usual activities, despite some symptoms.  ROS: All other review of systems was negative except as noted in the HPI.   Past Medical History:  Diagnosis Date   Arthritis    Heart murmur    asa child    Hypertension    Past Surgical History:  Procedure Laterality Date   EYE SURGERY Bilateral 2022   TONSILLECTOMY     TOTAL KNEE ARTHROPLASTY Right 03/30/2013   Procedure: RIGHT TOTAL KNEE ARTHROPLASTY;  Surgeon: Tobi Bastos, MD;  Location: [facility];  Service: Orthopedics;  Laterality: Right;   TOTAL KNEE ARTHROPLASTY Left 10/09/2020   Procedure: TOTAL KNEE ARTHROPLASTY;  Surgeon: Paralee Cancel, MD;  Location: [facility];  Service: Orthopedics;  Laterality: Left;  70 mins     No family history on file.   Social History:  reports that he has never smoked. He has never used smokeless tobacco. He reports that he does not drink alcohol and does not use drugs.   Exam: Current vital signs: BP 123/68   Pulse 63   Temp 97.8 F (36.6 C) (Oral)   Resp 19    SpO2 99%  Vital signs in last 24 hours: Temp:  [97.8 F (36.6 C)] 97.8 F (36.6 C) (05/28 1646) Pulse Rate:  [57-79] 79 (05/29 0330) Resp:  [13-20] 16 (05/29 0330) BP: (120-168)/(56-95) 143/69 (05/29 0330) SpO2:  [97 %-100 %] 99 % (05/29 0330)   Physical Exam  Constitutional: Appears well-developed and well-nourished, thin Psych: Affect appropriate to situation, very pleasant and cooperative Eyes: No scleral injection HENT: No oropharyngeal obstruction.  MSK: Arthritic changes of the hands.  Cardiovascular: Normal rate and regular rhythm.  Respiratory: Effort normal, non-labored breathing GI: Soft.  No distension. There is no tenderness.  Skin: Warm dry and intact visible skin  Neuro: Mental Status: Patient is awake, alert, oriented to person, place, month, year, and situation. Patient is able to give a clear and coherent history, somewhat limited in answering questions by his hearing loss. No signs of aphasia but he does have a moderate left-sided neglect Cranial Nerves: II: Visual Fields are notable for a left lower quadrantanopia. Pupils are equal, round, and reactive to light.   III,IV, VI: EOMI without ptosis or diploplia, but he does have a right gaze preference but can fully cross to the left.  V: Facial sensation is reduced on the left face VII: Facial movement is notable for a left droop.  VIII: hearing is extremely hard of hearing at baseline X: Uvula difficult to visualize XI:  Shoulder shrug is mildly weaker on the left XII: tongue is midline without atrophy or fasciculations.  Sensory/motor: Tone is normal. Bulk is normal.  Left-sided neglect makes formal testing of the left side slightly difficult, for example does not move his arm at all on command, but withdraws fairly briskly to noxious stimulation.  He is able to hold the left leg up for at least 5 seconds antigravity, 4/5 on confrontational strength testing at the hip flexor.  No drift of the right upper  extremity (some slight pronation potentially secondary to arthritic changes) 5/5 hip flexor on the right hip Sensory: Sensation is reduced on the left face arm and leg Deep Tendon Reflexes: Difficult to assess secondary to patient relaxation in the legs.  3+ and symmetric in the brachioradialis Plantars: Toes are downgoing on the right and upgoing on the left Cerebellar: FNF and HKS are notable for a slight tremor in the right upper extremity, proportional to weakness on the left arm and leg Gait:  Deferred   NIHSS total 11 Score breakdown: One-point for right gaze preference, one-point for partial left lower quadrant hemianopia, 2 points for left facial droop, 2 points for left upper extremity weakness, one-point for left lower extremity weakness, one-point for right upper extremity ataxia, one-point for left sided sensory loss, one-point for mild to moderate dysarthria, one-point for mild neglect of the left side Performed at 5 AM    I have reviewed labs in epic and the results pertinent to this consultation are:  Basic Metabolic Panel: Recent Labs  Lab 12/29/21 1700 12/29/21 1722  NA 140 141  K 4.4 4.4  CL 108 109  CO2 22  --   GLUCOSE 119* 118*  BUN 35* 36*  CREATININE 1.46* 1.40*  CALCIUM 9.9  --   MG 1.9  --   PHOS 3.5  --     CBC: Recent Labs  Lab 12/29/21 1700 12/29/21 1722  WBC 6.9  --   NEUTROABS 3.8  --   HGB 11.6* 11.6*  HCT 35.6* 34.0*  MCV 86.2  --   PLT 197  --     Coagulation Studies: Recent Labs    12/29/21 1700  LABPROT 16.7*  INR 1.4*    Lab Results  Component Value Date   CKTOTAL 328 12/29/2021   Lab Results  Component Value Date   HGBA1C 6.1 (H) 12/29/2021    No results found for: CHOL, HDL, LDLCALC, LDLDIRECT, TRIG, CHOLHDL   I have reviewed the images obtained:  Head CT personally reviewed, and reviewed with daughter at bedside agree with radiology: 1. Age indeterminate lacunar infarct within the right basal ganglia. 2.  Age-indeterminate small-vessel ischemic changes throughout the periventricular white matter, favor chronic. 3. Chronic left basal ganglia and right frontal white matter lacunar infarcts as above. 4. No acute hemorrhage.   Impression: This is an 88 year old gentleman with a past medical history of hypertension, hyperlipidemia, CKD, presenting with a right sided stroke based on examination.  Likely more than just the basal ganglia lesion seen on head CT, given he does have some neglect, though chronicity of these findings are unclear at this time  Recommendations: # Stroke based on clinical assessment - Stroke labs HgbA1c, fasting lipid panel - MRI brain  - MRA of the brain without contrast - Carotid dopplers - Frequent neuro checks - Echocardiogram - Prophylactic therapy-Antiplatelet med: Aspirin - dose 325mg  PO or 300mg  PR, followed by 81 mg daily - Plavix 300 mg load with 75 mg daily  for 21 - 90 day course  - Risk factor modification - Telemetry monitoring; 30 day event monitor on discharge if no arrythmias captured  - Blood pressure goal   - Permissive hypertension to 220/120 for now, may start normalizing tomorrow - PT consult, OT consult, Speech consult, unless patient is back to baseline - Stroke team to follow  [place] [phone] Available 7 PM to 7 AM, outside of these hours please call Neurologist on call as listed on Amion.

## 2021-12-30 NOTE — Evaluation (Signed)
Physical Therapy Evaluation Patient Details Name: Chad [name] MRN: [id number] DOB: 1932-06-25 Today's Date: 12/30/2021  History of Present Illness  88 yo male presenting to the ED on 5/28 with L sided weakness, facial droop, slurred speech, and fall. 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 PMH including hypothyroidism chronic pain syndromes, CKD, HTN, arthritis, bil TKA.  Clinical Impression  Pt admitted secondary to problem above with deficits below. Pt requiring mod A for bed mobility and mod A for transfers from stretcher this session. Pt with L lateral lean and unable to safely take steps at EOB this session. Pt previously independent with mobility and ADL tasks. Recommending CIR level therapies to increase independence and safety. Will continue to follow acutely.        Recommendations for follow up therapy are one component of a multi-disciplinary discharge planning process, led by the attending physician.  Recommendations may be updated based on patient status, additional functional criteria and insurance authorization.  Follow Up Recommendations Acute inpatient rehab (3hours/day)    Assistance Recommended at Discharge Frequent or constant Supervision/Assistance  Patient can return home with the following  A lot of help with walking and/or transfers;A lot of help with bathing/dressing/bathroom;Assistance with cooking/housework;Help with stairs or ramp for entrance;Assist for transportation    Equipment Recommendations Other (comment) (TBD)  Recommendations for Other Services       Functional Status Assessment Patient has had a recent decline in their functional status and demonstrates the ability to make significant improvements in function in a reasonable and predictable amount of time.     Precautions / Restrictions Precautions Precautions: Fall Restrictions Weight Bearing Restrictions: No      Mobility  Bed  Mobility Overal bed mobility: Needs Assistance Bed Mobility: Supine to Sit, Sit to Supine     Supine to sit: Mod assist Sit to supine: Mod assist   General bed mobility comments: assist for trunk and LE assist. Increased time required    Transfers Overall transfer level: Needs assistance Equipment used: None Transfers: Sit to/from Stand Sit to Stand: Mod assist           General transfer comment: Mod A for lift assist and steadying. Pt with L lateral lean in standing and unable to take steps with +1 assist.    Ambulation/Gait                  Stairs            Wheelchair Mobility    Modified Rankin (Stroke Patients Only)       Balance Overall balance assessment: Needs assistance Sitting-balance support: No upper extremity supported, Feet supported Sitting balance-Leahy Scale: Fair     Standing balance support: Single extremity supported, During functional activity Standing balance-Leahy Scale: Poor Standing balance comment: Reliant on UE and external support. L lateral lean in standing.                             Pertinent Vitals/Pain Pain Assessment Pain Assessment: Faces Faces Pain Scale: No hurt    Home Living Family/patient expects to be discharged to:: Private residence Living Arrangements: Alone Available Help at Discharge: Family;Available 24 hours/day Type of Home: Apartment Home Access: Level entry       Home Layout: One level Home Equipment: Conservation officer, nature (2 wheels);Cane - single point;Toilet riser;BSC/3in1      Prior Function Prior Level of Function : Independent/Modified  Independent             Mobility Comments: Uses SPC ADLs Comments: Pt performs ADLs and IADLs. Daughter drives. Unsure on medication management     Hand Dominance   Dominant Hand: Right    Extremity/Trunk Assessment   Upper Extremity Assessment Upper Extremity Assessment: Defer to OT evaluation    Lower Extremity  Assessment Lower Extremity Assessment: LLE deficits/detail LLE Deficits / Details: Grossly 3-/5 at knee extensors and hip flexors. 0/5 at ankle    Cervical / Trunk Assessment Cervical / Trunk Assessment: Kyphotic  Communication   Communication: HOH  Cognition Arousal/Alertness: Awake/alert Behavior During Therapy: WFL for tasks assessed/performed Overall Cognitive Status: Impaired/Different from baseline Area of Impairment: Attention, Memory, Following commands, Safety/judgement, Awareness, Problem solving                   Current Attention Level: Sustained (Short duration) Memory: Decreased short-term memory Following Commands: Follows one step commands with increased time, Follows one step commands inconsistently Safety/Judgement: Decreased awareness of deficits, Decreased awareness of safety Awareness: Intellectual Problem Solving: Slow processing, Requires verbal cues General Comments: Pt requiring increased time throughout session. Very pelasant and agreable to therapy. Decreased initation and benefiting from tactile cues. Poor problem solving and recall duirng vision assessment.        General Comments      Exercises     Assessment/Plan    PT Assessment Patient needs continued PT services  PT Problem List Decreased strength;Decreased balance;Decreased mobility;Decreased cognition;Decreased knowledge of use of DME;Decreased safety awareness;Decreased knowledge of precautions       PT Treatment Interventions Gait training;DME instruction;Functional mobility training;Stair training;Therapeutic activities;Balance training;Therapeutic exercise;Patient/family education    PT Goals (Current goals can be found in the Care Plan section)  Acute Rehab PT Goals Patient Stated Goal: to get stronger PT Goal Formulation: With patient/family Time For Goal Achievement: 01/13/22 Potential to Achieve Goals: Good    Frequency Min 4X/week     Co-evaluation                AM-PAC PT "6 Clicks" Mobility  Outcome Measure Help needed turning from your back to your side while in a flat bed without using bedrails?: A Little Help needed moving from lying on your back to sitting on the side of a flat bed without using bedrails?: A Lot Help needed moving to and from a bed to a chair (including a wheelchair)?: A Lot Help needed standing up from a chair using your arms (e.g., wheelchair or bedside chair)?: A Lot Help needed to walk in hospital room?: A Lot Help needed climbing 3-5 steps with a railing? : Total 6 Click Score: 12    End of Session Equipment Utilized During Treatment: Gait belt Activity Tolerance: Patient tolerated treatment well Patient left: in bed;with call bell/phone within reach;with family/visitor present (on stretcher in ED) Nurse Communication: Mobility status PT Visit Diagnosis: Unsteadiness on feet (R26.81);Muscle weakness (generalized) (M62.81);Difficulty in walking, not elsewhere classified (R26.2)    Time: 1017-5102 PT Time Calculation (min) (ACUTE ONLY): 16 min   Charges:   PT Evaluation $PT Eval Moderate Complexity: 1 Mod          Reuel Derby, PT, DPT  Acute Rehabilitation Services  Office: [phone]   Rudean Hitt 12/30/2021, 3:43 PM

## 2021-12-30 NOTE — ED Notes (Signed)
Pt incontinent of urine; pt cleaned, brief changed 

## 2021-12-30 NOTE — ED Notes (Signed)
SLP and Neurology at bedside

## 2021-12-30 NOTE — TOC Initial Note (Addendum)
Transition of Care [facility]) - Initial/Assessment Note    Patient Details  Name: Chad [name] MRN: [id number] Date of Birth: 08/12/1931  Transition of Care [facility]) CM/SW Contact:    Angelita Ingles, RN Phone Number:[phone]  12/30/2021, 3:38 PM  Clinical Narrative:                 Presented with fall out of bed Left side weakness. Transition of Care Department [facility]) has reviewed patient and no TOC needs have been identified at this time. We will continue to monitor patient advancement through interdisciplinary progression rounds. CIR following for possible CIR placement.         Patient Goals and CMS Choice        Expected Discharge Plan and Services                                                Prior Living Arrangements/Services                       Activities of Daily Living      Permission Sought/Granted                  Emotional Assessment              Admission diagnosis:  Stroke (cerebrum) ([place]) [I63.9] Acute ischemic stroke [facility]) [I63.9] Patient Active Problem List   Diagnosis Date Noted   Stroke (cerebrum) ([place]) 12/29/2021   CKD (chronic kidney disease) stage 3, GFR 30-59 ml/min ([place]) 12/29/2021   Anemia 12/29/2021   Hyperlipidemia 12/29/2021   Essential hypertension 12/29/2021   Osteoarthritis of left knee 10/09/2020   S/P left TKA 10/09/2020   S/P total knee arthroplasty, left 10/09/2020   Acute blood loss anemia 03/31/2013   Osteoarthritis of right knee 03/30/2013   PCP:  [name], MD Pharmacy:   [facility] [place], [place] [address] [place] [address] Phone: [phone] Fax: [place], [place] [address] AT [facility] [address] [place] [address] Phone: [phone] Fax: [phone]     Social Determinants of Health (SDOH)  Interventions    Readmission Risk Interventions     View : No data to display.

## 2021-12-31 ENCOUNTER — Other Ambulatory Visit (HOSPITAL_COMMUNITY): Payer: Self-pay | Admitting: Emergency Medicine

## 2021-12-31 ENCOUNTER — Inpatient Hospital Stay (HOSPITAL_BASED_OUTPATIENT_CLINIC_OR_DEPARTMENT_OTHER): Payer: Medicare Other

## 2021-12-31 ENCOUNTER — Other Ambulatory Visit (HOSPITAL_COMMUNITY): Payer: Self-pay

## 2021-12-31 ENCOUNTER — Encounter (HOSPITAL_COMMUNITY): Payer: Self-pay | Admitting: Internal Medicine

## 2021-12-31 ENCOUNTER — Inpatient Hospital Stay (HOSPITAL_COMMUNITY): Payer: Medicare Other

## 2021-12-31 DIAGNOSIS — E785 Hyperlipidemia, unspecified: Secondary | ICD-10-CM | POA: Diagnosis not present

## 2021-12-31 DIAGNOSIS — I6521 Occlusion and stenosis of right carotid artery: Secondary | ICD-10-CM | POA: Diagnosis not present

## 2021-12-31 DIAGNOSIS — I10 Essential (primary) hypertension: Secondary | ICD-10-CM | POA: Diagnosis not present

## 2021-12-31 DIAGNOSIS — I639 Cerebral infarction, unspecified: Secondary | ICD-10-CM | POA: Diagnosis not present

## 2021-12-31 HISTORY — PX: IR ANGIOGRAM EXTREMITY RIGHT: IMG652

## 2021-12-31 LAB — CBC
HCT: 30.7 % — ABNORMAL LOW (ref 39.0–52.0)
Hemoglobin: 10 g/dL — ABNORMAL LOW (ref 13.0–17.0)
MCH: 27.6 pg (ref 26.0–34.0)
MCHC: 32.6 g/dL (ref 30.0–36.0)
MCV: 84.8 fL (ref 80.0–100.0)
Platelets: 170 10*3/uL (ref 150–400)
RBC: 3.62 MIL/uL — ABNORMAL LOW (ref 4.22–5.81)
RDW: 16.4 % — ABNORMAL HIGH (ref 11.5–15.5)
WBC: 6.4 10*3/uL (ref 4.0–10.5)
nRBC: 0 % (ref 0.0–0.2)

## 2021-12-31 LAB — BASIC METABOLIC PANEL
Anion gap: 9 (ref 5–15)
BUN: 27 mg/dL — ABNORMAL HIGH (ref 8–23)
CO2: 22 mmol/L (ref 22–32)
Calcium: 8.9 mg/dL (ref 8.9–10.3)
Chloride: 114 mmol/L — ABNORMAL HIGH (ref 98–111)
Creatinine, Ser: 1.33 mg/dL — ABNORMAL HIGH (ref 0.61–1.24)
GFR, Estimated: 51 mL/min — ABNORMAL LOW (ref >60)
Glucose, Bld: 110 mg/dL — ABNORMAL HIGH (ref 70–99)
Potassium: 3.8 mmol/L (ref 3.5–5.1)
Sodium: 145 mmol/L (ref 135–145)

## 2021-12-31 MED ORDER — CEFAZOLIN SODIUM-DEXTROSE 2-4 GM/100ML-% IV SOLN
2.0000 g | INTRAVENOUS | Status: AC
  Administered 2022-01-01: 2 g via INTRAVENOUS
  Filled 2021-12-31: qty 100

## 2021-12-31 MED ORDER — TICAGRELOR 90 MG PO TABS
180.0000 mg | ORAL_TABLET | Freq: Once | ORAL | Status: AC
  Administered 2021-12-31: 180 mg via ORAL
  Filled 2021-12-31: qty 2

## 2021-12-31 MED ORDER — TICAGRELOR 90 MG PO TABS
90.0000 mg | ORAL_TABLET | Freq: Two times a day (BID) | ORAL | Status: DC
  Administered 2021-12-31 – 2022-01-01 (×2): 90 mg via ORAL
  Filled 2021-12-31 (×2): qty 1

## 2021-12-31 MED ORDER — SODIUM CHLORIDE 0.9 % IV SOLN: INTRAVENOUS | Status: AC

## 2021-12-31 MED ORDER — FENTANYL CITRATE (PF) 100 MCG/2ML IJ SOLN
INTRAMUSCULAR | Status: AC
  Filled 2021-12-31: qty 2

## 2021-12-31 MED ORDER — FENTANYL CITRATE (PF) 100 MCG/2ML IJ SOLN
INTRAMUSCULAR | Status: AC | PRN
  Administered 2021-12-31 (×2): 25 ug via INTRAVENOUS
  Administered 2021-12-31: 12.5 ug via INTRAVENOUS

## 2021-12-31 MED ORDER — LIDOCAINE HCL 1 % IJ SOLN
INTRAMUSCULAR | Status: AC
  Filled 2021-12-31: qty 20

## 2021-12-31 MED ORDER — SODIUM CHLORIDE 0.9 % IV SOLN
INTRAVENOUS | Status: DC
Start: 2021-12-31 — End: 2022-01-02

## 2021-12-31 MED ORDER — MIDAZOLAM HCL 2 MG/2ML IJ SOLN
INTRAMUSCULAR | Status: AC | PRN
Start: 2021-12-31 — End: 2021-12-31
  Administered 2021-12-31: 1 mg via INTRAVENOUS
  Administered 2021-12-31: .5 mg via INTRAVENOUS

## 2021-12-31 MED ORDER — ASPIRIN 81 MG PO TBEC
81.0000 mg | DELAYED_RELEASE_TABLET | Freq: Every day | ORAL | Status: DC
  Administered 2021-12-31 – 2022-01-01 (×2): 81 mg via ORAL
  Filled 2021-12-31 (×2): qty 1

## 2021-12-31 MED ORDER — MIDAZOLAM HCL 2 MG/2ML IJ SOLN
INTRAMUSCULAR | Status: AC
  Filled 2021-12-31: qty 2

## 2021-12-31 NOTE — H&P (Signed)
HPI:  The patient has had a H&P performed within the last 30 days, all history, medications, and exam have been reviewed. The patient denies any interval changes since the H&P.  Medications: Prior to Admission medications   Medication Sig Start Date End Date Taking? Authorizing Provider  amLODipine (NORVASC) 5 MG tablet Take 5 mg by mouth daily. 11/21/21  Yes [provider]  atenolol (TENORMIN) 100 MG tablet Take 100 mg by mouth every morning.   Yes [provider]  HYDROcodone-acetaminophen (NORCO) 7.5-325 MG tablet Take 1 tablet by mouth 3 (three) times daily as needed for moderate pain. 11/25/21  Yes [provider]  lovastatin (MEVACOR) 40 MG tablet Take 40 mg by mouth at bedtime.   Yes [provider]  tamsulosin (FLOMAX) 0.4 MG CAPS capsule Take 0.4 mg by mouth daily.   Yes [provider]  docusate sodium (COLACE) 100 MG capsule Take 1 capsule (100 mg total) by mouth 2 (two) times daily. Patient not taking: Reported on 12/29/2021 10/10/20   Irving Copas, PA-C  ferrous sulfate 325 (65 FE) MG tablet Take 1 tablet (325 mg total) by mouth 2 (two) times daily with a meal for 14 days. 10/10/20 10/24/20  Irving Copas, PA-C  HYDROcodone-acetaminophen (NORCO/VICODIN) 5-325 MG tablet Take 1-2 tablets by mouth every 6 (six) hours as needed for moderate pain (pain score 4-6). Patient not taking: Reported on 12/29/2021 10/10/20   Irving Copas, PA-C  methocarbamol (ROBAXIN) 500 MG tablet Take 1 tablet (500 mg total) by mouth every 6 (six) hours as needed for muscle spasms. Patient not taking: Reported on 12/29/2021 10/10/20   Irving Copas, PA-C  polyethylene glycol (MIRALAX / GLYCOLAX) 17 g packet Take 17 g by mouth 2 (two) times daily. Patient not taking: Reported on 12/29/2021 10/10/20   Irving Copas, PA-C     Vital Signs: BP (!) 173/95 (BP Location: Right Arm)   Pulse 84   Temp 98.3 F (36.8 C) (Oral)   Resp 20   SpO2 97%   Physical  Exam Vitals reviewed.  Constitutional:      General: He is not in acute distress.    Appearance: He is ill-appearing.  Cardiovascular:     Rate and Rhythm: Normal rate and regular rhythm.  Pulmonary:     Effort: Pulmonary effort is normal. No respiratory distress.     Breath sounds: Normal breath sounds.  Musculoskeletal:     Right lower leg: No edema.     Left lower leg: No edema.  Skin:    General: Skin is warm and dry.  Neurological:     Mental Status: He is alert. He is disoriented.    Mallampati Score:  MD Evaluation Airway: WNL Heart: WNL Abdomen: WNL Chest/ Lungs: WNL ASA  Classification: 3 Mallampati/Airway Score: Two  Labs:  CBC: Recent Labs    12/29/21 1700 12/29/21 1722 12/31/21 0357  WBC 6.9  --  6.4  HGB 11.6* 11.6* 10.0*  HCT 35.6* 34.0* 30.7*  PLT 197  --  170    COAGS: Recent Labs    12/29/21 1700  INR 1.4*  APTT 38*    BMP: Recent Labs    12/29/21 1700 12/29/21 1722 12/31/21 0357  NA 140 141 145  K 4.4 4.4 3.8  CL 108 109 114*  CO2 22  --  22  GLUCOSE 119* 118* 110*  BUN 35* 36* 27*  CALCIUM 9.9  --  8.9  CREATININE 1.46* 1.40* 1.33*  GFRNONAA 46*  --  51*    LIVER FUNCTION TESTS: Recent Labs    12/29/21 1700  BILITOT 0.8  AST 25  ALT 12  ALKPHOS 56  PROT 7.6  ALBUMIN 3.4*    Assessment/Plan:   Dr. Estanislado Pandy attempted cerebral arteriogram 12/31/21 with moderate sedation. Procedure was aborted d/t patient agitation and not following commands. Plan 01/01/22 for cerebral arteriogram with intervention with general anesthesia for R ICA occlusion. Pt daughter in agreement with plan.   Risks and benefits of right ICA arteriogram with intervention were under general anesthesia discussed with the patient's daughter including, but not limited to bleeding, infection, vascular injury, contrast induced renal failure, stroke, reperfusion hemorrhage, or even death. This interventional procedure involves the use of X-rays and because  of the nature of the planned procedure, it is possible that we will have prolonged use of X-ray fluoroscopy. Potential radiation risks to you include (but are not limited to) the following: - A slightly elevated risk for cancer  several years later in life. This risk is typically less than 0.5% percent. This risk is low in comparison to the normal incidence of human cancer, which is 33% for women and 50% for men according to the [place]. - Radiation induced injury can include skin redness, resembling a rash, tissue breakdown / ulcers and hair loss (which can be temporary or permanent).  The likelihood of either of these occurring depends on the difficulty of the procedure and whether you are sensitive to radiation due to previous procedures, disease, or genetic conditions.  IF your procedure requires a prolonged use of radiation, you will be notified and given written instructions for further action.  It is your responsibility to monitor the irradiated area for the 2 weeks following the procedure and to notify your physician if you are concerned that you have suffered a radiation induced injury.   All of the patient's questions were answered, patient is agreeable to proceed.  Consent signed and in IR control room.    Signed: Tyson Alias 12/31/2021, 1:35 PM

## 2021-12-31 NOTE — Progress Notes (Signed)
Due to agitation and inability to follow commands, cerebral arteriogram was aborted. Dr. Estanislado Pandy explained to pt daughter that it was unsafe to proceed. Plan to have general anesthesia tomorrow for cerebral arteriogram with intervention. Pt daughter agreed with plan and signed consent for planned procedure tomorrow.     Narda Rutherford, AGNP-BC 12/31/2021, 1:32 PM

## 2021-12-31 NOTE — Progress Notes (Signed)
Speech Language Pathology Treatment: Dysphagia  Patient Details Name: Chad [name] MRN: [id number] DOB: March 20, 1932 Today's Date: 12/31/2021 Time: 1017-5102 SLP Time Calculation (min) (ACUTE ONLY): 20 min  Assessment / Plan / Recommendation Clinical Impression  Planned to reassess swallow at bedside, however pt needs to lie flat for another hour after IR procedure today.  Daughter asked if he could drink while lying down or if she could try water with him later, but given the concerns for severity of dysphagia yesterday, we agreed he should wait for a repeat SLP visit. Orders written to resume dysphagia 1/pudding thick liquids (post-surgical orders today were for regular/thin). Sign posted at [facility]. Discussed with Chad [name] who agreed with plan. D/W RN.    Pt is scheduled for IR again tomorrow. PLEASE ENSURE THAT DIET ORDERS WRITTEN AFTER PROCEDURE ARE FOR DYS1/PUDDING liquids. We will plan for MBS on Thursday. Pt/dtr agree with plan.    HPI HPI: 88 yo male presenting to the ED on 5/28 with L sided weakness, facial droop, slurred speech, and fall. 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 PMH including hypothyroidism chronic pain syndromes, CKD, HTN, arthritis, bil TKA.      SLP Plan  Continue with current plan of care;MBS      Recommendations for follow up therapy are one component of a multi-disciplinary discharge planning process, led by the attending physician.  Recommendations may be updated based on patient status, additional functional criteria and insurance authorization.    Recommendations  Diet recommendations: Dysphagia 1 (puree);Pudding-thick liquid Liquids provided via: Teaspoon Medication Administration: Crushed with puree Supervision: Full supervision/cueing for compensatory strategies Compensations: Slow rate;Small sips/bites;Monitor for anterior loss                Oral Care Recommendations: Oral care  BID Follow Up Recommendations: Acute inpatient rehab (3hours/day) Assistance recommended at discharge: Frequent or constant Supervision/Assistance SLP Visit Diagnosis: Dysphagia, oropharyngeal phase (R13.12) Plan: Continue with current plan of care;MBS         Chad Koerner L. Chad [name], [place] Office number [phone] Pager [phone]   Chad [name]  12/31/2021, 4:32 PM

## 2021-12-31 NOTE — PMR Pre-admission (Signed)
PMR Admission Coordinator Pre-Admission Assessment  Patient: Chad [name] is an 88 y.o., male MRN: [id number] DOB: 03/09/1932 Height: 5' 1"  (154.9 cm) Weight: 62.2 kg  Insurance Information HMO: yes    PPO:      PCP:      IPA:      80/20:      OTHER:  PRIMARY: [employer] Medicare      Policy#: [id number]      Subscriber: pt CM Name: Eddie Dibbles      Phone#: [phone]     Fax#: [phone] Pre-Cert#: [id number] auth for CIR for 7 days with updates due 6/7     Employer:  Benefits:  Phone #: [phone]     Name: n/a Eff. Date: 08/04/21     Deduct: $0      Out of Pocket Max: [phone] (met $45)      Life Max: n/a CIR: $430/day for days 1-4      SNF: 20 full days Outpatient:      Co-Pay: $20/visit Home Health: 100%      Co-Pay:  DME: 80%     Co-Ins: 20% Providers:  SECONDARY:       Policy#:      Phone#:   Development worker, community:       Phone#:   The "Data Collection Information Summary" for patients in Inpatient Rehabilitation Facilities with attached "Privacy Act [place] Records" was provided and verbally reviewed with: Patient and Family  Emergency Contact Information Contact Information     Name Relation Home Work Orick Daughter [phone]  [phone]       Current Medical History  Patient Admitting Diagnosis: CVA   History of Present Illness: Chad [name]. [name] is an 88 year old right-handed male with history of hypertension, CKD stage III, hyperlipidemia, mild aortic regurgitation, bilateral total knee arthroplasty, chronic pain using hydrocodone 7.5-325 mg 3 times daily as needed, and BPH maintained on Flomax.   Presented 12/29/2021 with acute onset of left-sided weakness resulting in a fall.  Denied loss of consciousness.  Cranial CT scan showed age-indeterminate lacunar infarction within the right basal ganglia.  Age-indeterminate small vessel ischemic changes throughout the periventricular white matter, favor chronic.  No acute hemorrhage.  CT cervical spine  negative for fracture.  Extensive multilevel cervical degenerative changes.  MRI/MRA showed cluster of small cortical and white matter infarcts within the right MCA distribution.  MRI focal high-grade narrowing at the right M2 level where there was calcification by CT.  CTA of the neck apparent occlusion of proximal right M2 MCA branch in the region of calcification possibly calcified embolus or calcified plaque.  Extensive calcific atherosclerosis of the right carotid bifurcation with approximately 80% stenosis of the proximal ICA.  Potentially severe stenosis of the nondominant right vertebral artery origin.  Patient did not receive tPA.  Echocardiogram with ejection fraction of 55 to 60% no wall motion abnormalities.  Admission chemistries unremarkable except glucose 119 BUN 35 creatinine 1.46, CK within normal limits, troponin 76, hemoglobin A1c 6.1, lactic acid within normal limits.  Hospital course interventional radiology consulted for extensive calcified atherosclerosis of the right ICA bifurcation with 80% stenosis status post right carotid angioplasty 5/31.  Placed on full dose aspirin and Brilinta therapy for CVA prophylaxis.  Plan is to follow-up outpatient interventional radiology 1 to 2 months if restenosis to consider right CEA.  Maintained on Lovenox for DVT prophylaxis.  Blood pressure monitored initially maintained on Cleviprex.  He is currently maintained on a dysphagia #1 pudding thick  liquid diet.  Therapy evaluations completed and pt was recommended for a comprehensive rehab program.  Complete NIHSS TOTAL: 17  Patient's medical record from [facility] has been reviewed by the rehabilitation admission coordinator and physician.  Past Medical History  Past Medical History:  Diagnosis Date   Arthritis    Heart murmur    asa child    Hypertension    Stroke [facility])     Has the patient had major surgery during 100 days prior to admission? Yes  Family History   family history is not on  file.  Current Medications  Current Facility-Administered Medications:    0.9 %  sodium chloride infusion, , Intravenous, Continuous, Rosalin Hawking, MD, Last Rate: 75 mL/hr at 12/31/21 2014, New Bag at 01/01/22 1307   0.9 %  sodium chloride infusion, , Intravenous, Continuous, Adhikari, Amrit, MD, Last Rate: 75 mL/hr at 12/31/21 2019, Restarted at 01/01/22 0911   0.9 %  sodium chloride infusion, , Intravenous, Continuous, Deveshwar, Sanjeev, MD, Last Rate: 75 mL/hr at 01/02/22 1200, Infusion Verify at 01/02/22 1200   acetaminophen (TYLENOL) tablet 650 mg, 650 mg, Oral, Q4H PRN **OR** acetaminophen (TYLENOL) 160 MG/5ML solution 650 mg, 650 mg, Per Tube, Q4H PRN **OR** acetaminophen (TYLENOL) suppository 650 mg, 650 mg, Rectal, Q4H PRN, Deveshwar, Sanjeev, MD   amLODipine (NORVASC) tablet 5 mg, 5 mg, Oral, Daily, Adhikari, Amrit, MD, 5 mg at 01/02/22 0935   aspirin chewable tablet 81 mg, 81 mg, Oral, Daily, 81 mg at 01/02/22 0936 **OR** aspirin chewable tablet 81 mg, 81 mg, Per Tube, Daily, Deveshwar, Sanjeev, MD   atenolol (TENORMIN) tablet 100 mg, 100 mg, Oral, q morning, Adhikari, Amrit, MD, 100 mg at 01/02/22 0936   Chlorhexidine Gluconate Cloth 2 % PADS 6 each, 6 each, Topical, Daily, Rosalin Hawking, MD, 6 each at 01/02/22 0938   clevidipine (CLEVIPREX) infusion 0.5 mg/mL, 0-21 mg/hr, Intravenous, Continuous, Deveshwar, Sanjeev, MD, Stopped at 01/02/22 0936   enoxaparin (LOVENOX) injection 30 mg, 30 mg, Subcutaneous, Q24H, Pierce, Dwayne A, RPH   fentaNYL (SUBLIMAZE) injection 25 mcg, 25 mcg, Intravenous, Q2H PRN, Doutova, Anastassia, MD   HYDROcodone-acetaminophen (NORCO) 7.5-325 MG per tablet 1 tablet, 1 tablet, Oral, TID PRN, Roel Cluck, Anastassia, MD, 1 tablet at 01/01/22 0322   labetalol (NORMODYNE) injection 10 mg, 10 mg, Intravenous, Q2H PRN, Adhikari, Amrit, MD, 10 mg at 12/31/21 2028   rosuvastatin (CRESTOR) tablet 20 mg, 20 mg, Oral, Daily, Rosalin Hawking, MD, 20 mg at 01/02/22 0935    tamsulosin (FLOMAX) capsule 0.4 mg, 0.4 mg, Oral, Daily, Doutova, Anastassia, MD, 0.4 mg at 12/31/21 1026   ticagrelor (BRILINTA) tablet 90 mg, 90 mg, Oral, BID, 90 mg at 01/02/22 0935 **OR** ticagrelor (BRILINTA) tablet 90 mg, 90 mg, Per Tube, BID, Deveshwar, Sanjeev, MD  Patients Current Diet:  Diet Order             DIET - DYS 1 Room service appropriate? Yes with Assist; Fluid consistency: Pudding Thick  Diet effective now                   Precautions / Restrictions Precautions Precautions: Fall Restrictions Weight Bearing Restrictions: No   Has the patient had 2 or more falls or a fall with injury in the past year? Yes  Prior Activity Level Household: uses SPC/RW for mobilization and ADLs, mod I at baseline, not driving (dtr drives to appointments)  Prior Functional Level Self Care: Did the patient need help bathing, dressing, using the toilet or eating?  Independent  Indoor Mobility: Did the patient need assistance with walking from room to room (with or without device)? Independent  Stairs: Did the patient need assistance with internal or external stairs (with or without device)? Independent  Functional Cognition: Did the patient need help planning regular tasks such as shopping or remembering to take medications? Independent  Patient Information Are you of Hispanic, Latino/a,or Spanish origin?: A. No, not of Hispanic, Latino/a, or Spanish origin What is your race?: B. Black or African American, C. American [place] or [place] Native Do you need or want an interpreter to communicate with a doctor or health care staff?: 0. No  Patient's Response To:  Health Literacy and Transportation Is the patient able to respond to health literacy and transportation needs?: No Health Literacy - How often do you need to have someone help you when you read instructions, pamphlets, or other written material from your doctor or pharmacy?: Patient unable to respond In the past 12 months,  has lack of transportation kept you from medical appointments or from getting medications?: No (per proxy) In the past 12 months, has lack of transportation kept you from meetings, work, or from getting things needed for daily living?: No (per proxy)  Home Assistive Devices / Equipment Home Equipment: Conservation officer, nature (2 wheels), [employer] - single point, Geneticist, molecular, BSC/3in1  Prior Device Use: Indicate devices/aids used by the patient prior to current illness, exacerbation or injury? Walker and SPC  Current Functional Level Cognition  Arousal/Alertness: Awake/alert Overall Cognitive Status: Impaired/Different from baseline Current Attention Level: Sustained Orientation Level: Oriented X4 Following Commands: Follows one step commands with increased time, Follows one step commands inconsistently Safety/Judgement: Decreased awareness of deficits, Decreased awareness of safety General Comments: Pt requiring increased time throughout session. Pt initially agreable to therapy. Stated he was irritated at his deficits toward end of session. Decreased initation and benefiting from verbal and tactile cues. Pt with signigicant left neglect and left field cut likely.  Difficult to get pt to look beyond midline to his left with right gaze preference. Attention: Sustained, Selective Sustained Attention: Impaired Sustained Attention Impairment: Verbal basic Selective Attention: Impaired Selective Attention Impairment: Verbal basic Memory: Impaired Memory Impairment: Retrieval deficit Awareness: Impaired Awareness Impairment: Emergent impairment Problem Solving: Impaired    Extremity Assessment (includes Sensation/Coordination)  Upper Extremity Assessment: Defer to OT evaluation LUE Deficits / Details: no initation of active movement. Tone noted with flexion at elbow and digits. Able to achieve extension of digits. Elbow extension to 120. Decreased sensation LUE Sensation: decreased light touch,  decreased proprioception LUE Coordination: decreased gross motor, decreased fine motor  Lower Extremity Assessment: LLE deficits/detail LLE Deficits / Details: Grossly 3-/5 at knee extensors and hip flexors. 0/5 at ankle    ADLs  Overall ADL's : Needs assistance/impaired Eating/Feeding: Moderate assistance, Bed level Grooming: Moderate assistance, Bed level Upper Body Bathing: Moderate assistance, Sitting Lower Body Bathing: Maximal assistance, Sit to/from stand Upper Body Dressing : Moderate assistance, Sitting Lower Body Dressing: Maximal assistance, Sit to/from stand Lower Body Dressing Details (indicate cue type and reason): donning socks at EOB Toilet Transfer: Moderate assistance Toilet Transfer Details (indicate cue type and reason): simulated with side steps towards HOB Functional mobility during ADLs: Moderate assistance (side steps towards HOB)    Mobility  Overal bed mobility: Needs Assistance Bed Mobility: Rolling, Sidelying to Sit Rolling: Mod assist Sidelying to sit: Mod assist, Max assist Supine to sit: Mod assist Sit to supine: Max assist General bed mobility comments: with multimodal cues,  pt needed mod assist to facilitate roll to R and truncal assist up from R UE, pt cued to asisst L LE with R LE but difficulty doing so therefore needed assist with LEs out and into bed. Pt initiated movement to EOB with cues but could not assist with sliding left hip out and difficulty even with right hip.    Transfers  Overall transfer level: Needs assistance Equipment used: 2 person hand held assist Transfers: Sit to/from Stand Sit to Stand: Total assist, Max assist, +2 physical assistance General transfer comment: First stand attempt total assist of 2 with pt leaning quite a bit to right unless assisted.  Second attempt, Pt initiated stand therefore max assist of 2  but continues to have difficulty maintaining stand without constant cues and assist with use of pad to maintain hip  and trunk extension.  Attempts at weight shifting with use of pad to faciliate. When pt asked to complete weight shift, he moved minimially at his trunk and hips only.    Ambulation / Gait / Stairs / Wheelchair Mobility  Ambulation/Gait General Gait Details: not able yet    Posture / Balance Dynamic Sitting Balance Sitting balance - Comments: worked on moving in/out of BOS and re-finding midline and upright posture.  Pt leans to left and is unaware unless cued.  Pt cannot correct without assist.  Pt sits at times with min guard assist but most of the time needs min to mod assist due to loss of balance to his left.  Pts balance also worsens as he fatigues. Balance Overall balance assessment: Needs assistance Sitting-balance support: No upper extremity supported, Feet supported Sitting balance-Leahy Scale: Fair Sitting balance - Comments: worked on moving in/out of BOS and re-finding midline and upright posture.  Pt leans to left and is unaware unless cued.  Pt cannot correct without assist.  Pt sits at times with min guard assist but most of the time needs min to mod assist due to loss of balance to his left.  Pts balance also worsens as he fatigues. Standing balance support: Bilateral upper extremity supported, During functional activity Standing balance-Leahy Scale: Poor Standing balance comment: Reliant on UE and external support. L lateral lean in standing.  2 trials of 1 min working on w/shifting with intent of trying to get pt to stand upright.    Pt not able to adequately stand without +2 support wtih forward flexed trunk, head and neck.  Did not get much activation of left hemibody.    Special needs/care consideration N/A   Previous Home Environment (from acute therapy documentation) Living Arrangements: Alone  Lives With: Alone Available Help at Discharge: Family, Available 24 hours/day Type of Home: Apartment Home Layout: One level Home Access: Level entry Bathroom Shower/Tub:  Chiropodist: Standard  Discharge Living Setting Plans for Discharge Living Setting: Lives with (comment) (daughter, April) Type of Home at Discharge: House Discharge Home Layout: One level (sunken den, pt doesn't need to access) Discharge Home Access: Stairs to enter Entrance Stairs-Rails: Right Entrance Stairs-Number of Steps: 3 Discharge Bathroom Shower/Tub: Tub/shower unit Discharge Bathroom Toilet: Handicapped height Discharge Bathroom Accessibility: Yes How Accessible: Accessible via walker Does the patient have any problems obtaining your medications?: No  Social/Family/Support Systems Anticipated Caregiver: daughter, April Appelt Anticipated Caregiver's Contact Information: [phone] Ability/Limitations of Caregiver: n/a Caregiver Availability: 24/7 Discharge Plan Discussed with Primary Caregiver: Yes Is Caregiver In Agreement with Plan?: Yes Does Caregiver/Family have Issues with Lodging/Transportation while Pt is in Rehab?: No  Goals Patient/Family Goal for Rehab: PT/OT min assist w/c level, SLP min to mod assist Expected length of stay: 21-24 days Additional Information: reviewed updated goals for min assist w/c level with daughter day of admit and she confirms family can provide Pt/Family Agrees to Admission and willing to participate: Yes Program Orientation Provided & Reviewed with Pt/Caregiver Including Roles  & Responsibilities: Yes  Barriers to Discharge: Insurance for SNF coverage  Decrease burden of Care through IP rehab admission: n/a  Possible need for SNF placement upon discharge: not anticipated. Pt with good support from daughter at discharge and aware insurance will not pay for CIR and SNF.   Patient Condition: I have reviewed medical records from [facility], spoken with CSW, and patient and daughter. I met with patient at the bedside for inpatient rehabilitation assessment.  Patient will benefit from ongoing PT, OT, and SLP, can  actively participate in 3 hours of therapy a day 5 days of the week, and can make measurable gains during the admission.  Patient will also benefit from the coordinated team approach during an Inpatient Acute Rehabilitation admission.  The patient will receive intensive therapy as well as Rehabilitation physician, nursing, social worker, and care management interventions.  Due to bladder management, bowel management, safety, skin/wound care, disease management, medication administration, pain management, and patient education the patient requires 24 hour a day rehabilitation nursing.  The patient is currently mod assist with mobility and mod to max assist with basic ADLs.  Discharge setting and therapy post discharge at  home  is anticipated.  Patient has agreed to participate in the Acute Inpatient Rehabilitation Program and will admit today.  Preadmission Screen Completed By:  Michel Santee, PT, DPT 01/02/2022 12:15 PM ______________________________________________________________________   Discussed status with Dr. Dagoberto Ligas on 01/02/22  at 12:15 PM  and received approval for admission today.  Admission Coordinator:  Michel Santee, PT, DPT time 12:15 PM Sudie Grumbling 01/02/22    Assessment/Plan: Diagnosis: Does the need for close, 24 hr/day Medical supervision in concert with the patient's rehab needs make it unreasonable for this patient to be served in a less intensive setting? Yes Co-Morbidities requiring supervision/potential complications: R MCA strokes; L hemiparesis/chronic pain, HTN, CKD, BPH and insulin resistance which is new A1c 6.1 Due to bladder management, bowel management, safety, skin/wound care, disease management, medication administration, pain management, and patient education, does the patient require 24 hr/day rehab nursing? Yes Does the patient require coordinated care of a physician, rehab nurse, PT, OT, and SLP to address physical and functional deficits in the context of the above  medical diagnosis(es)? Yes Addressing deficits in the following areas: balance, endurance, locomotion, strength, transferring, bowel/bladder control, bathing, dressing, feeding, grooming, toileting, cognition, speech, language, and swallowing Can the patient actively participate in an intensive therapy program of at least 3 hrs of therapy 5 days a week? Yes The potential for patient to make measurable gains while on inpatient rehab is good and fair Anticipated functional outcomes upon discharge from inpatient rehab: min assist PT, min assist OT, min assist and mod assist SLP Estimated rehab length of stay to reach the above functional goals is: 21-24 days Anticipated discharge destination: Home 10. Overall Rehab/Functional Prognosis: fair   MD Signature:

## 2021-12-31 NOTE — H&P (Signed)
Chief Complaint: Patient was seen in consultation today for M2 stenosis   at the request of Rosalin Hawking, MD  Referring Physician(s): Rosalin Hawking, MD  Supervising Physician: Luanne Bras  Patient Status: [facility] - In-pt  History of Present Illness: Chad [name] is a 88 y.o. male inpatient with PMH of HTN, HLD, hearing loss. Pt presented to the ED at [facility] on 5.28.23 c/o  fall from bed and left sided weakness. 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 CT Angio head from shows occlusion of a proximal right M2 MCA branch in the region of calcification, possibly calcified embolus (particularly given calcific atherosclerosis at the carotid bifurcation) or calcified plaque. Dr. Erlinda Hong has referred pt to IR for cerebral angiogram for further evaluation of M2 stenosis. Case was reviewed and approved by Dr. Estanislado Pandy. Brilinta and ASA ordered per Dr. Estanislado Pandy.   CT Angio head/neck 12/30/21:  IMPRESSION:   1. 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 2. Extensive calcific atherosclerosis at the right carotid bifurcation with approximately 80% stenosis of the proximal ICA. 3. 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   CT head:   Small right MCA territory infarcts better characterized on same day MRI. No evidence of progressive mass effect or acute hemorrhage.    Past Medical History:  Diagnosis Date   Arthritis    Heart murmur    asa child    Hypertension     Past Surgical History:  Procedure Laterality Date   EYE SURGERY Bilateral 2022   TONSILLECTOMY     TOTAL KNEE ARTHROPLASTY Right 03/30/2013   Procedure: RIGHT TOTAL KNEE ARTHROPLASTY;  Surgeon: Tobi Bastos, MD;  Location: [facility] ORS;  Service: Orthopedics;  Laterality: Right;   TOTAL KNEE  ARTHROPLASTY Left 10/09/2020   Procedure: TOTAL KNEE ARTHROPLASTY;  Surgeon: Paralee Cancel, MD;  Location: [facility];  Service: Orthopedics;  Laterality: Left;  70 mins    Allergies: Patient has no known allergies.  Medications: Prior to Admission medications   Medication Sig Start Date End Date Taking? Authorizing Provider  amLODipine (NORVASC) 5 MG tablet Take 5 mg by mouth daily. 11/21/21  Yes [provider]  atenolol (TENORMIN) 100 MG tablet Take 100 mg by mouth every morning.   Yes [provider]  HYDROcodone-acetaminophen (NORCO) 7.5-325 MG tablet Take 1 tablet by mouth 3 (three) times daily as needed for moderate pain. 11/25/21  Yes [provider]  lovastatin (MEVACOR) 40 MG tablet Take 40 mg by mouth at bedtime.   Yes [provider]  tamsulosin (FLOMAX) 0.4 MG CAPS capsule Take 0.4 mg by mouth daily.   Yes [provider]  docusate sodium (COLACE) 100 MG capsule Take 1 capsule (100 mg total) by mouth 2 (two) times daily. Patient not taking: Reported on 12/29/2021 10/10/20   Irving Copas, PA-C  ferrous sulfate 325 (65 FE) MG tablet Take 1 tablet (325 mg total) by mouth 2 (two) times daily with a meal for 14 days. 10/10/20 10/24/20  Irving Copas, PA-C  HYDROcodone-acetaminophen (NORCO/VICODIN) 5-325 MG tablet Take 1-2 tablets by mouth every 6 (six) hours as needed for moderate pain (pain score 4-6). Patient not taking: Reported on 12/29/2021 10/10/20   Irving Copas, PA-C  methocarbamol (ROBAXIN) 500 MG tablet Take 1 tablet (500 mg total) by mouth every 6 (  six) hours as needed for muscle spasms. Patient not taking: Reported on 12/29/2021 10/10/20   Irving Copas, PA-C  polyethylene glycol (MIRALAX / GLYCOLAX) 17 g packet Take 17 g by mouth 2 (two) times daily. Patient not taking: Reported on 12/29/2021 10/10/20   Irving Copas, PA-C     No family history on file.  Social History   Socioeconomic History   Marital status: Widowed     Spouse name: Not on file   Number of children: Not on file   Years of education: Not on file   Highest education level: Not on file  Occupational History   Not on file  Tobacco Use   Smoking status: Never   Smokeless tobacco: Never  Vaping Use   Vaping Use: Never used  Substance and Sexual Activity   Alcohol use: No   Drug use: No   Sexual activity: Not on file  Other Topics Concern   Not on file  Social History Narrative   Not on file   Social Determinants of Health   Financial Resource Strain: Not on file  Food Insecurity: Not on file  Transportation Needs: Not on file  Physical Activity: Not on file  Stress: Not on file  Social Connections: Not on file    Review of Systems: A 12 point ROS discussed and pertinent positives are indicated in the HPI above.  All other systems are negative.  Review of Systems  Constitutional:  Negative for appetite change and fever.  Cardiovascular:  Negative for leg swelling.  Skin:  Positive for wound.       Dressed wound to left elbow from fall PTA  Neurological:  Positive for facial asymmetry and weakness.   Vital Signs: BP 132/66 (BP Location: Right Leg)   Pulse 85   Temp 98.2 F (36.8 C) (Oral)   Resp 20   SpO2 100%   Physical Exam Vitals reviewed.  Constitutional:      General: He is not in acute distress.    Appearance: He is ill-appearing.  HENT:     Head: Normocephalic and atraumatic.     Mouth/Throat:     Mouth: Mucous membranes are dry.     Pharynx: Oropharynx is clear.  Cardiovascular:     Rate and Rhythm: Normal rate and regular rhythm.     Pulses: Normal pulses.     Heart sounds: Normal heart sounds.  Pulmonary:     Effort: Pulmonary effort is normal. No respiratory distress.     Breath sounds: Normal breath sounds.  Abdominal:     General: Bowel sounds are normal.     Palpations: Abdomen is soft.  Musculoskeletal:     Right lower leg: No edema.     Left lower leg: No edema.  Skin:    General: Skin  is warm and dry.  Neurological:     Mental Status: He is alert. He is disoriented.     Comments: Alert, aware, but seems confused at times.  Speech is slurred.  L sided facial droop.  Deviation of tongue to L Right hand grip 4/5, no drift noted LUE 0/5 hand grip, unable to lift LUE, falls immediately to bed when lifted.  R plantar flexion 5/5, L plantar flexion 0/5 Unable to assess dorsiflexion d/t inability to follow commands.   Gait not assessed Romberg not assessed Heel to toe not assessed Distal pulses not assessed     Psychiatric:        Mood and Affect: Mood normal.  Behavior: Behavior normal.    Imaging: CT ANGIO HEAD NECK W WO CM  Result Date: 12/30/2021 CLINICAL DATA:  Stroke/TIA, determine embolic source EXAM: CT ANGIOGRAPHY HEAD AND NECK TECHNIQUE: Multidetector CT imaging of the head and neck was performed using the standard protocol during bolus administration of intravenous contrast. Multiplanar CT image reconstructions and MIPs were obtained to evaluate the vascular anatomy. Carotid stenosis measurements (when applicable) are obtained utilizing NASCET criteria, using the distal internal carotid diameter as the denominator. RADIATION DOSE REDUCTION: This exam was performed according to the departmental dose-optimization program which includes automated exposure control, adjustment of the mA and/or kV according to patient size and/or use of iterative reconstruction technique. CONTRAST:  64mL OMNIPAQUE IOHEXOL 350 MG/ML SOLN COMPARISON:  Same day MRI/MRA. FINDINGS: CT HEAD FINDINGS Brain: Small right MCA territory infarcts better characterized on same day MRI. No evidence of acute hemorrhage, mass effect, midline shift, extra-axial fluid collection, or hydrocephalus. Patchy white matter hypodensities, nonspecific but compatible with chronic microvascular ischemic disease. Vascular: Detailed below. Skull: No acute fracture. Sinuses: Mild paranasal sinus mucosal thickening.  Orbits: No acute orbital findings. Review of the MIP images confirms the above findings CTA NECK FINDINGS Aortic arch: Calcific atherosclerosis of the aorta and great vessel origins. Ectatic right subclavian artery. Right carotid system: Atherosclerosis at the carotid bifurcation with approximately 80% stenosis. Left carotid system: Atherosclerosis at the carotid bifurcation without greater than 50% stenosis. Vertebral arteries: Potentially severe stenosis of the right vertebral artery origin due to calcific atherosclerosis. Otherwise, vertebral arteries are patent without significant (greater than 50%) stenosis. Left dominant. Skeleton: Severe multilevel degenerative change. Other neck: No acute findings. Upper chest: Clear sinuses. Review of the MIP images confirms the above findings CTA HEAD FINDINGS Anterior circulation: Bilateral intracranial ICAs are patent with mild narrowing due to calcific atherosclerosis. Bilateral M1 MCAs and left M2 MCAs are patent. Apparent occlusion of a right M2 MCA branch in a region of calcification. Bilateral ACAs are patent. Posterior circulation: Left dominant intradural vertebral artery. Right intradural vertebral artery makes very small contribution to the basilar artery. Basilar artery and bilateral posterior cerebral arteries are patent without proximal hemodynamically significant stenosis. Venous sinuses: As permitted by contrast timing, patent. Anatomic variants: Detailed above. Review of the MIP images confirms the above findings IMPRESSION: CTA: 1. 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 2. Extensive calcific atherosclerosis at the right carotid bifurcation with approximately 80% stenosis of the proximal ICA. 3. 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 CT head: Small  right MCA territory infarcts better characterized on same day MRI. No evidence of progressive mass effect or acute hemorrhage. Findings discussed with Dr. Erlinda Hong via telephone at 12:50 p.m. Electronically Signed   By: Margaretha Sheffield M.D.   On: 12/30/2021 13:00   DG Elbow Complete Left  Result Date: 12/29/2021 CLINICAL DATA:  Fall.  Left elbow pain and bruising. EXAM: LEFT ELBOW - COMPLETE 3+ VIEW COMPARISON:  None Available. FINDINGS: No fracture or dislocation. Narrowed radiocapitellar joint. Marginal osteophytes project from the base of the radial head. No joint effusion. There is soft tissue edema posteriorly. IMPRESSION: No fracture or dislocation. Electronically Signed   By: Lajean Manes M.D.   On: 12/29/2021 18:12   CT HEAD WO CONTRAST  Result Date: 12/29/2021 CLINICAL DATA:  Neurologic deficit EXAM: CT HEAD WITHOUT CONTRAST TECHNIQUE: Contiguous axial images  were obtained from the base of the skull through the vertex without intravenous contrast. RADIATION DOSE REDUCTION: This exam was performed according to the departmental dose-optimization program which includes automated exposure control, adjustment of the mA and/or kV according to patient size and/or use of iterative reconstruction technique. COMPARISON:  None Available. FINDINGS: Brain: Confluent hypodensities throughout the periventricular white matter are most consistent with age-indeterminate small vessel ischemic changes, likely chronic. Focal hypodensities in the left basal ganglia and right frontal periventricular white matter consistent with chronic lacunar infarcts. Age-indeterminate lacunar infarct is seen within the right basal ganglia image 18/3. No other signs of acute infarct or hemorrhage. Lateral ventricles and midline structures are otherwise unremarkable. No acute extra-axial fluid collections. No mass effect. Vascular: No hyperdense vessel or unexpected calcification. Skull: Normal. Negative for fracture or focal lesion.  Sinuses/Orbits: No acute finding. Other: None. IMPRESSION: 1. Age indeterminate lacunar infarct within the right basal ganglia. 2. Age-indeterminate small-vessel ischemic changes throughout the periventricular white matter, favor chronic. 3. Chronic left basal ganglia and right frontal white matter lacunar infarcts as above. 4. No acute hemorrhage. Electronically Signed   By: Randa Ngo M.D.   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 Multiplanar CT image reconstructions were also generated. RADIATION DOSE REDUCTION: This exam was performed according to the departmental dose-optimization program which includes automated exposure control, adjustment of the mA and/or kV according to patient size and/or use of iterative reconstruction technique. COMPARISON:  None Available. FINDINGS: Alignment: Mild degenerative anterolisthesis of C2 and C3. Otherwise alignment is anatomic. Skull base and vertebrae: No acute fracture. No primary bone lesion or focal pathologic process. Soft tissues and spinal canal: No prevertebral fluid or swelling. No visible canal hematoma. Disc levels: Partial bony fusion across the disc spaces at C3-4 and C4-5. There is severe multilevel spondylosis throughout the remainder of the cervical spine, greatest at C5-6 and C6-7. Diffuse facet hypertrophy. Upper chest: Airway is patent. Lung apices are clear. Prominent atherosclerosis of the aortic arch. Other: Reconstructed images demonstrate no additional findings. IMPRESSION: 1. Extensive multilevel cervical degenerative changes. No acute fracture. Electronically Signed   By: Randa Ngo M.D.   On: 12/29/2021 17:38   MR ANGIO HEAD WO CONTRAST  Result Date: 12/30/2021 CLINICAL DATA:  There are deficit with acute  stroke suspected EXAM: MRI HEAD WITHOUT CONTRAST MRA HEAD WITHOUT CONTRAST TECHNIQUE: Multiplanar, multi-echo pulse sequences of the brain and surrounding structures were acquired without intravenous contrast. Angiographic images of the Circle of Willis were acquired using MRA technique without intravenous contrast. COMPARISON:  Head CT from yesterday FINDINGS: MRI HEAD FINDINGS Brain: Cluster of small acute infarcts in the right insular cortex, right frontal cortex, and deep right cerebral white matter. These are in a MCA distribution. Background of advanced chronic small vessel ischemia with confluent gliosis in the hemispheric white matter. Chronic lacunar infarcts in the deep cerebrum. Small remote left cerebral infarct. Gradient signal at the lower right sylvian fissure with there is calcification along the MCA branches by CT. Vascular: Major flow voids are preserved Skull and upper cervical spine: Normal marrow signal Sinuses/Orbits: Bilateral cataract resection MRA HEAD FINDINGS Anterior circulation: High-grade narrowing involving the upper right M2 branch with there is a gradient hypointensity noted above. Elsewhere vessels are smoothly contoured and widely patent. Posterior circulation:  Vertebrobasilar arteries are smoothly contoured and widely patent. IMPRESSION: Brain MRI: 1. Cluster of small cortical and white matter infarcts within the right MCA distribution. 2. Background of advanced chronic small vessel ischemia. Intracranial MRA: Focal high-grade narrowing at the right M2 level where there is calcification by CT. This could reflect calcified plaque or a calcified embolus. If an embolus, the proximal nature and degree of mild infarct with suggest additional territory of risk. Electronically Signed   By: Jorje Guild M.D.   On: 12/30/2021 07:53   MR BRAIN WO CONTRAST  Result Date: 12/30/2021 CLINICAL DATA:  There are deficit with acute stroke suspected EXAM: MRI HEAD WITHOUT CONTRAST MRA HEAD  WITHOUT CONTRAST TECHNIQUE: Multiplanar, multi-echo pulse sequences of the brain and surrounding structures were acquired without intravenous contrast. Angiographic images of the Circle of Willis were acquired using MRA technique without intravenous contrast. COMPARISON:  Head CT from yesterday FINDINGS: MRI HEAD FINDINGS Brain: Cluster of small acute infarcts in the right insular cortex, right frontal cortex, and deep right cerebral white matter. These are in a MCA distribution. Background of advanced chronic small vessel ischemia with confluent gliosis in the hemispheric white matter. Chronic lacunar infarcts in the deep cerebrum. Small remote left cerebral infarct. Gradient signal at the lower right sylvian fissure with there is calcification along the MCA branches by CT. Vascular: Major flow voids are preserved Skull and upper cervical spine: Normal marrow signal Sinuses/Orbits: Bilateral cataract resection MRA HEAD FINDINGS Anterior circulation: High-grade narrowing involving the upper right M2 branch with there is a gradient hypointensity noted above. Elsewhere vessels are smoothly contoured and widely patent. Posterior circulation: Vertebrobasilar arteries are smoothly contoured and widely patent. IMPRESSION: Brain MRI: 1. Cluster of small cortical and white matter infarcts within the right MCA distribution. 2. Background of advanced chronic small vessel ischemia. Intracranial MRA: Focal high-grade narrowing at the right M2 level where there is calcification by CT. This could reflect calcified plaque or a calcified embolus. If an embolus, the proximal nature and degree of mild infarct with suggest additional territory of risk. Electronically Signed   By: Jorje Guild M.D.   On: 12/30/2021 07:53   DG Chest Portable 1 View  Result Date: 12/29/2021 CLINICAL DATA:  CVA. Fell last night. Family concerned patient injured left side EXAM: PORTABLE CHEST 1 VIEW COMPARISON:  Chest two views 03/23/2013 FINDINGS:  Cardiac silhouette is mildly enlarged. Moderate calcifications within the aortic arch and descending thoracic aorta. Minimal bibasilar horizontal linear chronic scarring is unchanged. No focal airspace opacity to indicate pneumonia. No pleural effusion or pneumothorax. Moderate multilevel degenerative disc changes of the thoracic spine. IMPRESSION: Mild stable cardiomegaly. No acute lung process. Electronically Signed   By: Yvonne Kendall M.D.   On: 12/29/2021 19:19   DG Hand Complete Left  Result Date: 12/29/2021 CLINICAL DATA:  Fall.  Bruising to posterior left hand. EXAM: LEFT HAND - COMPLETE 3+ VIEW COMPARISON:  None Available. FINDINGS: There is diffuse decreased bone mineralization. A pulse oximeter overlies the distal index finger obscuring portions. Degenerative changes including joint space narrowing, subchondral sclerosis and peripheral osteophytosis are moderate at the triscaphe joint; moderate to severe at the thumb carpometacarpal, thumb metacarpophalangeal, and thumb interphalangeal joints; and moderate to severe at the DIP joints and moderate at the PIP joints of the second through fifth fingers. No acute fracture is seen. No dislocation. IMPRESSION: Osteoarthritis as above. No acute fracture. Electronically Signed   By: Yvonne Kendall M.D.   On: 12/29/2021 18:12  ECHOCARDIOGRAM COMPLETE  Result Date: 12/30/2021    ECHOCARDIOGRAM REPORT   Patient Name:   Chad [name] Date of Exam: 12/30/2021 Medical Rec #:  [id number]         Height:       66.0 in Accession #:    [id number]        Weight:       133.6 lb Date of Birth:  [birth year]          BSA:          1.685 m Patient Age:    34 years          BP:           161/84 mmHg Patient Gender: M                 HR:           66 bpm. Exam Location:  Inpatient Procedure: 2D Echo Indications:    stroke  History:        Patient has prior history of Echocardiogram examinations, most                 recent 04/25/2020. Chronic kidney disease.; Risk                  Factors:Hypertension and Dyslipidemia.  Sonographer:    Johny Chess RDCS Referring Phys: [place]  1. Left ventricular ejection fraction, by estimation, is 55 to 60%. The left ventricle has normal function. The left ventricle has no regional wall motion abnormalities. There is mild left ventricular hypertrophy. Left ventricular diastolic parameters are consistent with Grade I diastolic dysfunction (impaired relaxation).  2. Right ventricular systolic function is normal. The right ventricular size is normal.  3. Left atrial size was mildly dilated.  4. The mitral valve is normal in structure. Mild mitral valve regurgitation. No evidence of mitral stenosis.  5. The aortic valve is tricuspid. Aortic valve regurgitation is mild. Aortic valve sclerosis/calcification is present, without any evidence of aortic stenosis.  6. The inferior vena cava is normal in size with greater than 50% respiratory variability, suggesting right atrial pressure of 3 mmHg. FINDINGS  Left Ventricle: Left ventricular ejection fraction, by estimation, is 55 to 60%. The left ventricle has normal function. The left ventricle has no regional wall motion abnormalities. The left ventricular internal cavity size was normal in size. There is  mild left ventricular hypertrophy. Left ventricular diastolic parameters are consistent with Grade I diastolic dysfunction (impaired relaxation). Right Ventricle: The right ventricular size is normal. Right ventricular systolic function is normal. Left Atrium: Left atrial size was mildly dilated. Right Atrium: Right atrial size was normal in size. Pericardium: Trivial pericardial effusion is present. Mitral Valve: The mitral valve is normal in structure. Mild mitral annular calcification. Mild mitral valve regurgitation. No evidence of mitral valve stenosis. Tricuspid Valve: The tricuspid valve is normal in structure. Tricuspid valve regurgitation is trivial. No evidence of  tricuspid stenosis. Aortic Valve: The aortic valve is tricuspid. Aortic valve regurgitation is mild. Aortic regurgitation PHT measures 400 msec. Aortic valve sclerosis/calcification is present, without any evidence of aortic stenosis. Pulmonic Valve: The pulmonic valve was normal in structure. Pulmonic valve regurgitation is trivial. No evidence of pulmonic stenosis. Aorta: The aortic root is normal in size and structure. Venous: The inferior vena cava is normal in size with greater than 50% respiratory variability, suggesting right atrial pressure of 3 mmHg. IAS/Shunts: No atrial level shunt detected by color flow  Doppler.  LEFT VENTRICLE PLAX 2D LVIDd:         5.20 cm   Diastology LVIDs:         3.10 cm   LV e' medial:    4.50 cm/s LV PW:         1.20 cm   LV E/e' medial:  12.0 LV IVS:        1.10 cm   LV e' lateral:   4.20 cm/s LVOT diam:     2.10 cm   LV E/e' lateral: 12.9 LV SV:         80 LV SV Index:   47 LVOT Area:     3.46 cm  RIGHT VENTRICLE             IVC RV S prime:     15.70 cm/s  IVC diam: 1.00 cm TAPSE (M-mode): 2.0 cm LEFT ATRIUM             Index        RIGHT ATRIUM           Index LA diam:        3.40 cm 2.02 cm/m   RA Area:     16.30 cm LA Vol (A2C):   79.2 ml 47.01 ml/m  RA Volume:   39.80 ml  23.62 ml/m LA Vol (A4C):   47.5 ml 28.20 ml/m LA Biplane Vol: 63.0 ml 37.40 ml/m  AORTIC VALVE LVOT Vmax:   102.00 cm/s LVOT Vmean:  67.600 cm/s LVOT VTI:    0.230 m AI PHT:      400 msec  AORTA Ao Root diam: 3.70 cm Ao Asc diam:  3.10 cm MITRAL VALVE MV Area (PHT): 4.60 cm    SHUNTS MV Decel Time: 165 msec    Systemic VTI:  0.23 m MR Peak grad: 134.6 mmHg   Systemic Diam: 2.10 cm MR Mean grad: 82.0 mmHg MR Vmax:      580.00 cm/s MR Vmean:     416.0 cm/s MV E velocity: 54.00 cm/s MV A velocity: 96.40 cm/s MV E/A ratio:  0.56 Kirk Ruths MD Electronically signed by Kirk Ruths MD Signature Date/Time: 12/30/2021/2:40:23 PM    Final    VAS US CAROTID  Result Date: 12/30/2021 Carotid Arterial  Duplex Study Patient Name:  Chad [name]  Date of Exam:   12/30/2021 Medical Rec #: [id number]          Accession #:    [id number] Date of Birth: [birth year]           Patient Gender: M Patient Age:   3 years Exam Location:  [facility] Procedure:      VAS US CAROTID Referring Phys: Nyoka Lint DOUTOVA --------------------------------------------------------------------------------  Indications:       CVA. Risk Factors:      Hypertension. Comparison Study:  No prior study Performing Technologist: Maudry Mayhew MHA, RDMS, RVT, RDCS  Examination Guidelines: A complete evaluation includes B-mode imaging, spectral Doppler, color Doppler, and power Doppler as needed of all accessible portions of each vessel. Bilateral testing is considered an integral part of a complete examination. Limited examinations for reoccurring indications may be performed as noted.  Right Carotid Findings: +----------+-------+--------+--------+-------------------------------+---------+           PSV    EDV cm/sStenosisPlaque Description             Comments            cm/s                                                            +----------+-------+--------+--------+-------------------------------+---------+  CCA Prox  54     7               heterogenous and irregular               +----------+-------+--------+--------+-------------------------------+---------+ CCA Distal58     9               smooth and heterogenous                  +----------+-------+--------+--------+-------------------------------+---------+ ICA Prox  132    19              smooth, heterogenous and       Shadowing                                  calcific                                 +----------+-------+--------+--------+-------------------------------+---------+ ICA Distal78     15                                                        +----------+-------+--------+--------+-------------------------------+---------+ ECA       126                                                             +----------+-------+--------+--------+-------------------------------+---------+ +----------+--------+-------+----------------+-------------------+           PSV cm/sEDV cmsDescribe        Arm Pressure (mmHG) +----------+--------+-------+----------------+-------------------+ [id number]            Multiphasic, WNL                    +----------+--------+-------+----------------+-------------------+ +---------+--------+--+--------+-+---------+ VertebralPSV cm/s51EDV cm/s5Antegrade +---------+--------+--+--------+-+---------+  Left Carotid Findings: +----------+--------+--------+--------+------------------------------+---------+           PSV cm/sEDV cm/sStenosisPlaque Description            Comments  +----------+--------+--------+--------+------------------------------+---------+ CCA Prox  89                      heterogenous and irregular    tortuous  +----------+--------+--------+--------+------------------------------+---------+ CCA Distal70      8                                                       +----------+--------+--------+--------+------------------------------+---------+ ICA Prox  49      12              heterogenous, irregular and   Shadowing                                   calcific                                +----------+--------+--------+--------+------------------------------+---------+  ICA Distal98      18                                                      +----------+--------+--------+--------+------------------------------+---------+ ECA       67      10                                                      +----------+--------+--------+--------+------------------------------+---------+ +----------+--------+--------+----------------+-------------------+            PSV cm/sEDV cm/sDescribe        Arm Pressure (mmHG) +----------+--------+--------+----------------+-------------------+ [id number]             Multiphasic, WNL                    +----------+--------+--------+----------------+-------------------+ +---------+--------+--+--------+--+---------+ VertebralPSV cm/s94EDV cm/s14Antegrade +---------+--------+--+--------+--+---------+   Summary: Right Carotid: Velocities in the right ICA are consistent with a 1-39% stenosis. Left Carotid: Velocities in the left ICA are consistent with a 1-39% stenosis. Vertebrals:  Bilateral vertebral arteries demonstrate antegrade flow. Subclavians: Normal flow hemodynamics were seen in bilateral subclavian              arteries. *See table(s) above for measurements and observations.     Preliminary     Labs:  CBC: Recent Labs    12/29/21 1700 12/29/21 1722 12/31/21 0357  WBC 6.9  --  6.4  HGB 11.6* 11.6* 10.0*  HCT 35.6* 34.0* 30.7*  PLT 197  --  170    COAGS: Recent Labs    12/29/21 1700  INR 1.4*  APTT 38*    BMP: Recent Labs    12/29/21 1700 12/29/21 1722 12/31/21 0357  NA 140 141 145  K 4.4 4.4 3.8  CL 108 109 114*  CO2 22  --  22  GLUCOSE 119* 118* 110*  BUN 35* 36* 27*  CALCIUM 9.9  --  8.9  CREATININE 1.46* 1.40* 1.33*  GFRNONAA 46*  --  51*    LIVER FUNCTION TESTS: Recent Labs    12/29/21 1700  BILITOT 0.8  AST 25  ALT 12  ALKPHOS 56  PROT 7.6  ALBUMIN 3.4*    TUMOR MARKERS: No results for input(s): AFPTM, CEA, CA199, CHROMGRNA in the last 8760 hours.  Assessment and Plan:  History of HTN, HLD, hearing loss. Pt presented [facility] ED at [facility] on 5[facility]28[facility]23 c/o  fall from bed and left sided weakness. 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 Dr. Erlinda Hong has referred pt to IR for cerebral angiogram for further evaluation of M2 stenosis. Case was reviewed and approved by Dr. Estanislado Pandy. Brilinta and ASA ordered per Dr. Estanislado Pandy.   CT Angio head/neck 12/30/21:  IMPRESSION:   1. 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 2. Extensive calcific atherosclerosis at the right carotid bifurcation with approximately 80% stenosis of  the proximal ICA. 3. 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   CT head:   Small right MCA territory infarcts better characterized on same day MRI. No evidence of progressive mass effect or acute hemorrhage.  Pt resting in bed with family at bedside. Pt is alert, but appears confused at times. Pt has been NPO per order.  Pt daughter states that pt did not pass swallow and is only allowed thickened food/fluids.   Risks and benefits of cerebral arteriogram were discussed with the patient including, but not limited to bleeding, infection, vascular injury, contrast induced renal failure, stroke or even death.  This interventional procedure involves the use of X-rays and because of the nature of the planned procedure, it is possible that we will have prolonged use of X-ray fluoroscopy.  Potential radiation risks to you include (but are not limited to) the following: - A slightly elevated risk for cancer  several years later in life. This risk is typically less than 0.5% percent. This risk is low in comparison to the normal incidence of human cancer, which is 33% for women and 50% for men according to the [employer]. - Radiation induced injury can include skin redness, resembling a rash, tissue breakdown / ulcers and hair loss (which can be temporary or permanent).   The  likelihood of either of these occurring depends on the difficulty of the procedure and whether you are sensitive to radiation due to previous procedures, disease, or genetic conditions.   IF your procedure requires a prolonged use of radiation, you will be notified and given written instructions for further action.  It is your responsibility to monitor the irradiated area for the 2 weeks following the procedure and to notify your physician if you are concerned that you have suffered a radiation induced injury.    All of the patient's questions were answered, patient is agreeable to proceed.  Consent signed and in chart.  After cerebral arteriogram findings today, plan for pt to return to IR tomorrow for cerebral arteriogram with intervention with general anesthesia.  Consent for cerebral arteriogram obtained, signed in chart.   Thank you for this interesting consult.  I greatly enjoyed meeting Chad [name] and look forward to participating in their care.  A copy of this report was sent to the requesting provider on this date.  Electronically Signed: Tyson Alias, NP 12/31/2021, 8:17 AM   I spent a total of 20 minutes in face to face in clinical consultation, greater than 50% of which was counseling/coordinating care for cerebral angiogram.

## 2021-12-31 NOTE — Progress Notes (Signed)
PROGRESS NOTE  Chad [name]  [id number] DOB: May 06, 1932 DOA: 12/29/2021 PCP: [name], MD   Brief Narrative: Patient is a 88 year old male with history of hypothyroidism, chronic pain syndrome, CKD stage IIIa hypertension, arthritis who presented with a fall from home.  Patient lives alone.  Patient was also found to have left-sided weakness, left-sided facial droop, slurred speech.  CT head showed age-indeterminate lacunar infarct within the right basal ganglia.  Stroke suspected and was admitted.  Neurology consulted.  MRI showed right MCA distribution infarcts,.  Stroke work-up initiated.  Imagings also showed occlusion of a proximal right M2 MCA branch .  IR consulted for cerebral angiogram /possibly stenting PT/OT recommending CIR on discharge.   Assessment & Plan:  Principal Problem:   Stroke (cerebrum) (HCC) Active Problems:   CKD (chronic kidney disease) stage 3, GFR 30-59 ml/min (HCC)   Anemia   Hyperlipidemia   Essential hypertension  Acute ischemic stroke: Presented with fall.  Found to have left-sided weakness.  Neurology following.   MRI showed cluster of small cortical and white matter infarcts within the right MCA distribution.Intracranial MRA showed focal high-grade narrowing at the right M2 level .  CT angio head and neck also showed 80% stenosis of the proximal ICA,severe stenosis of the non dominant right vertebral artery origin. Echo showed EF of 55 to 01%, grade 1 diastolic dysfunction no atrial level shunt or any intracardiac source of emboli.   Hemoglobin A1c 6.1.  LDL 90.  Started on a statin, aspirin, Brilinta. Currently he is alert oriented.  At baseline, he lives alone, can walk, cooks his own food.  Hypertension: Allow permissive hypertension for now.  Continue as needed Medications for severe hypertension.  CKD stage IIIa: Currently kidney.  Baseline.  Hyperlipidemia: Continue crestor  Arthritis: Continue supportive care.             DVT prophylaxis:enoxaparin (LOVENOX) injection 30 mg Start: 12/30/21 2000 SCD's Start: 12/29/21 2003     Code Status: Full Code  Family Communication: Daughter at bedside  Patient status:Inpatient  Patient is from :Home  Anticipated discharge to:CIR  Estimated DC date:after full stroke work-up   Consultants: Neurology  Procedures:None yet  Antimicrobials:  Anti-infectives (From admission, onward)    None       Subjective: Patient seen and examined at the bedside this morning.  Hemodynamically stable.  Daughter at bedside.  Continues to have severe weakness on the left side with left facial droop.  Complains of back pain today  Objective: Vitals:   12/30/21 1937 12/30/21 2329 12/31/21 0347 12/31/21 0819  BP: 130/73 (!) 140/108 132/66 (!) 152/79  Pulse: 76 82 85 84  Resp:    18  Temp: 97.8 F (36.6 C) 98.7 F (37.1 C) 98.2 F (36.8 C) 98.3 F (36.8 C)  TempSrc:  Oral Oral Oral  SpO2: 99% 98% 100% 93%    Intake/Output Summary (Last 24 hours) at 12/31/2021 1042 Last data filed at 12/31/2021 0357 Gross per 24 hour  Intake 184.83 ml  Output 1000 ml  Net -815.17 ml   There were no vitals filed for this visit.  Examination:  General exam: Overall comfortable, not in distress, deconditioned elderly male HEENT: PERRL, left facial droop Respiratory system:  no wheezes or crackles  Cardiovascular system: S1 & S2 heard, RRR.  Gastrointestinal system: Abdomen is nondistended, soft and nontender. Central nervous system: Alert and oriented, weakness on the left side with motor of 2+/5, left facial droop Extremities: No edema, no clubbing ,no  cyanosis Skin: No rashes, no ulcers,no icterus     Data Reviewed: I have personally reviewed following labs and imaging studies  CBC: Recent Labs  Lab 12/29/21 1700 12/29/21 1722 12/31/21 0357  WBC 6.9  --  6.4  NEUTROABS 3.8  --   --   HGB 11.6* 11.6* 10.0*  HCT 35.6* 34.0* 30.7*  MCV 86.2  --  84.8  PLT 197  --   527   Basic Metabolic Panel: Recent Labs  Lab 12/29/21 1700 12/29/21 1722 12/31/21 0357  NA 140 141 145  K 4.4 4.4 3.8  CL 108 109 114*  CO2 22  --  22  GLUCOSE 119* 118* 110*  BUN 35* 36* 27*  CREATININE 1.46* 1.40* 1.33*  CALCIUM 9.9  --  8.9  MG 1.9  --   --   PHOS 3.5  --   --      No results found for this or any previous visit (from the past 240 hour(s)).   Radiology Studies: CT ANGIO HEAD NECK W WO CM  Result Date: 12/30/2021 CLINICAL DATA:  Stroke/TIA, determine embolic source EXAM: CT ANGIOGRAPHY HEAD AND NECK TECHNIQUE: Multidetector CT imaging of the head and neck was performed using the standard protocol during bolus administration of intravenous contrast. Multiplanar CT image reconstructions and MIPs were obtained to evaluate the vascular anatomy. Carotid stenosis measurements (when applicable) are obtained utilizing NASCET criteria, using the distal internal carotid diameter as the denominator. RADIATION DOSE REDUCTION: This exam was performed according to the departmental dose-optimization program which includes automated exposure control, adjustment of the mA and/or kV according to patient size and/or use of iterative reconstruction technique. CONTRAST:  55mL OMNIPAQUE IOHEXOL 350 MG/ML SOLN COMPARISON:  Same day MRI/MRA. FINDINGS: CT HEAD FINDINGS Brain: Small right MCA territory infarcts better characterized on same day MRI. No evidence of acute hemorrhage, mass effect, midline shift, extra-axial fluid collection, or hydrocephalus. Patchy white matter hypodensities, nonspecific but compatible with chronic microvascular ischemic disease. Vascular: Detailed below. Skull: No acute fracture. Sinuses: Mild paranasal sinus mucosal thickening. Orbits: No acute orbital findings. Review of the MIP images confirms the above findings CTA NECK FINDINGS Aortic arch: Calcific atherosclerosis of the aorta and great vessel origins. Ectatic right subclavian artery. Right carotid system:  Atherosclerosis at the carotid bifurcation with approximately 80% stenosis. Left carotid system: Atherosclerosis at the carotid bifurcation without greater than 50% stenosis. Vertebral arteries: Potentially severe stenosis of the right vertebral artery origin due to calcific atherosclerosis. Otherwise, vertebral arteries are patent without significant (greater than 50%) stenosis. Left dominant. Skeleton: Severe multilevel degenerative change. Other neck: No acute findings. Upper chest: Clear sinuses. Review of the MIP images confirms the above findings CTA HEAD FINDINGS Anterior circulation: Bilateral intracranial ICAs are patent with mild narrowing due to calcific atherosclerosis. Bilateral M1 MCAs and left M2 MCAs are patent. Apparent occlusion of a right M2 MCA branch in a region of calcification. Bilateral ACAs are patent. Posterior circulation: Left dominant intradural vertebral artery. Right intradural vertebral artery makes very small contribution to the basilar artery. Basilar artery and bilateral posterior cerebral arteries are patent without proximal hemodynamically significant stenosis. Venous sinuses: As permitted by contrast timing, patent. Anatomic variants: Detailed above. Review of the MIP images confirms the above findings IMPRESSION: CTA: 1. 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 2. Extensive calcific atherosclerosis at the right carotid bifurcation with  approximately 80% stenosis of the proximal ICA. 3. 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 CT head: Small right MCA territory infarcts better characterized on same day MRI. No evidence of progressive mass effect or acute hemorrhage. Findings discussed with Dr. Erlinda Hong via telephone at 12:50 p.m. Electronically Signed   By: Margaretha Sheffield M.D.   On:  12/30/2021 13:00   DG Elbow Complete Left  Result Date: 12/29/2021 CLINICAL DATA:  Fall.  Left elbow pain and bruising. EXAM: LEFT ELBOW - COMPLETE 3+ VIEW COMPARISON:  None Available. FINDINGS: No fracture or dislocation. Narrowed radiocapitellar joint. Marginal osteophytes project from the base of the radial head. No joint effusion. There is soft tissue edema posteriorly. IMPRESSION: No fracture or dislocation. Electronically Signed   By: Lajean Manes M.D.   On: 12/29/2021 18:12   CT HEAD WO CONTRAST  Result Date: 12/29/2021 CLINICAL DATA:  Neurologic deficit EXAM: CT HEAD WITHOUT CONTRAST TECHNIQUE: Contiguous axial images were obtained from the base of the skull through the vertex without intravenous contrast. RADIATION DOSE REDUCTION: This exam was performed according to the departmental dose-optimization program which includes automated exposure control, adjustment of the mA and/or kV according to patient size and/or use of iterative reconstruction technique. COMPARISON:  None Available. FINDINGS: Brain: Confluent hypodensities throughout the periventricular white matter are most consistent with age-indeterminate small vessel ischemic changes, likely chronic. Focal hypodensities in the left basal ganglia and right frontal periventricular white matter consistent with chronic lacunar infarcts. Age-indeterminate lacunar infarct is seen within the right basal ganglia image 18/3. No other signs of acute infarct or hemorrhage. Lateral ventricles and midline structures are otherwise unremarkable. No acute extra-axial fluid collections. No mass effect. Vascular: No hyperdense vessel or unexpected calcification. Skull: Normal. Negative for fracture or focal lesion. Sinuses/Orbits: No acute finding. Other: None. IMPRESSION: 1. Age indeterminate lacunar infarct within the right basal ganglia. 2. Age-indeterminate small-vessel ischemic changes throughout the periventricular white matter, favor chronic. 3. Chronic  left basal ganglia and right frontal white matter lacunar infarcts as above. 4. No acute hemorrhage. Electronically Signed   By: Randa Ngo M.D.   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 Multiplanar CT image reconstructions were also generated. RADIATION DOSE REDUCTION: This exam was performed according to the departmental dose-optimization program which includes automated exposure control, adjustment of the mA and/or kV according to patient size and/or use of iterative reconstruction technique. COMPARISON:  None Available. FINDINGS: Alignment: Mild degenerative anterolisthesis of C2 and C3. Otherwise alignment is anatomic. Skull base and vertebrae: No acute fracture. No primary bone lesion or focal pathologic process. Soft tissues and spinal canal: No prevertebral fluid or swelling. No visible canal hematoma. Disc levels: Partial bony fusion across the disc spaces at C3-4 and C4-5. There is severe multilevel spondylosis throughout the remainder of the cervical spine, greatest at C5-6 and C6-7. Diffuse facet hypertrophy. Upper chest: Airway is patent. Lung apices are clear. Prominent atherosclerosis of the aortic arch. Other: Reconstructed images demonstrate no additional findings. IMPRESSION: 1. Extensive multilevel cervical degenerative changes. No acute fracture. Electronically Signed   By: Randa Ngo M.D.   On: 12/29/2021 17:38   MR ANGIO HEAD WO CONTRAST  Result Date: 12/30/2021 CLINICAL DATA:  There are deficit with acute stroke suspected EXAM: MRI HEAD WITHOUT  CONTRAST MRA HEAD WITHOUT CONTRAST TECHNIQUE: Multiplanar, multi-echo pulse sequences of the brain and surrounding structures were acquired without intravenous contrast. Angiographic images of the [place] were acquired using MRA technique without intravenous contrast. COMPARISON:  Head CT from yesterday FINDINGS: MRI HEAD FINDINGS Brain: Cluster of small acute infarcts in the right insular cortex, right frontal cortex, and deep right cerebral white matter. These are in a MCA distribution. Background of advanced chronic small vessel ischemia with confluent gliosis in the hemispheric white matter. Chronic lacunar infarcts in the deep cerebrum. Small remote left cerebral infarct. Gradient signal at the lower right sylvian fissure with there is calcification along the MCA branches by CT. Vascular: Major flow voids are preserved Skull and upper cervical spine: Normal marrow signal Sinuses/Orbits: Bilateral cataract resection MRA HEAD FINDINGS Anterior circulation: High-grade narrowing involving the upper right M2 branch with there is a gradient hypointensity noted above. Elsewhere vessels are smoothly contoured and widely patent. Posterior circulation: Vertebrobasilar arteries are smoothly contoured and widely patent. IMPRESSION: Brain MRI: 1. Cluster of small cortical and white matter infarcts within the right MCA distribution. 2. Background of advanced chronic small vessel ischemia. Intracranial MRA: Focal high-grade narrowing at the right M2 level where there is calcification by CT. This could reflect calcified plaque or a calcified embolus. If an embolus, the proximal nature and degree of mild infarct with suggest additional territory of risk. Electronically Signed   By: Jorje Guild M.D.   On: 12/30/2021 07:53   MR BRAIN WO CONTRAST  Result Date: 12/30/2021 CLINICAL DATA:  There are deficit with acute stroke suspected EXAM: MRI HEAD WITHOUT CONTRAST MRA HEAD WITHOUT CONTRAST TECHNIQUE: Multiplanar, multi-echo pulse sequences of the brain and surrounding structures were acquired without intravenous contrast. Angiographic images of the [place] were acquired using MRA technique without intravenous  contrast. COMPARISON:  Head CT from yesterday FINDINGS: MRI HEAD FINDINGS Brain: Cluster of small acute infarcts in the right insular cortex, right frontal cortex, and deep right cerebral white matter. These are in a MCA distribution. Background of advanced chronic small vessel ischemia with confluent gliosis in the hemispheric white matter. Chronic lacunar infarcts in the deep cerebrum. Small remote left cerebral infarct. Gradient signal at the lower right sylvian fissure with there is calcification along the MCA branches by CT. Vascular: Major flow voids are preserved Skull and upper cervical spine: Normal marrow signal Sinuses/Orbits: Bilateral cataract resection MRA HEAD FINDINGS Anterior circulation: High-grade narrowing involving the upper right M2 branch with there is a gradient hypointensity noted above. Elsewhere vessels are smoothly contoured and widely patent. Posterior circulation: Vertebrobasilar arteries are smoothly contoured and widely patent. IMPRESSION: Brain MRI: 1. Cluster of small cortical and white matter infarcts within the right MCA distribution. 2. Background of advanced chronic small vessel ischemia. Intracranial MRA: Focal high-grade narrowing at the right M2 level where there is calcification by CT. This could reflect calcified plaque or a calcified embolus. If an embolus, the proximal nature and degree of mild infarct with suggest additional territory of risk. Electronically Signed   By: Jorje Guild M.D.   On: 12/30/2021 07:53   DG Chest Portable 1 View  Result Date: 12/29/2021 CLINICAL DATA:  CVA. Fell last night. Family concerned patient injured left side EXAM: PORTABLE CHEST 1 VIEW COMPARISON:  Chest two views 03/23/2013 FINDINGS: Cardiac silhouette is mildly enlarged. Moderate calcifications within the aortic arch and descending thoracic aorta. Minimal bibasilar horizontal linear chronic scarring is unchanged. No focal airspace opacity to  indicate pneumonia. No pleural  effusion or pneumothorax. Moderate multilevel degenerative disc changes of the thoracic spine. IMPRESSION: Mild stable cardiomegaly. No acute lung process. Electronically Signed   By: Yvonne Kendall M.D.   On: 12/29/2021 19:19   DG Hand Complete Left  Result Date: 12/29/2021 CLINICAL DATA:  Fall.  Bruising to posterior left hand. EXAM: LEFT HAND - COMPLETE 3+ VIEW COMPARISON:  None Available. FINDINGS: There is diffuse decreased bone mineralization. A pulse oximeter overlies the distal index finger obscuring portions. Degenerative changes including joint space narrowing, subchondral sclerosis and peripheral osteophytosis are moderate at the triscaphe joint; moderate to severe at the thumb carpometacarpal, thumb metacarpophalangeal, and thumb interphalangeal joints; and moderate to severe at the DIP joints and moderate at the PIP joints of the second through fifth fingers. No acute fracture is seen. No dislocation. IMPRESSION: Osteoarthritis as above. No acute fracture. Electronically Signed   By: Yvonne Kendall M.D.   On: 12/29/2021 18:12   ECHOCARDIOGRAM COMPLETE  Result Date: 12/30/2021    ECHOCARDIOGRAM REPORT   Patient Name:   Chad [name] Date of Exam: 12/30/2021 Medical Rec #:  [id number]         Height:       66.0 in Accession #:    [id number]        Weight:       133.6 lb Date of Birth:  [birth year]          BSA:          1.685 m Patient Age:    42 years          BP:           161/84 mmHg Patient Gender: M                 HR:           66 bpm. Exam Location:  Inpatient Procedure: 2D Echo Indications:    stroke  History:        Patient has prior history of Echocardiogram examinations, most                 recent 04/25/2020. Chronic kidney disease.; Risk                 Factors:Hypertension and Dyslipidemia.  Sonographer:    Johny Chess RDCS Referring Phys: Tome  1. Left ventricular ejection fraction, by estimation, is 55 to 60%. The left ventricle has normal  function. The left ventricle has no regional wall motion abnormalities. There is mild left ventricular hypertrophy. Left ventricular diastolic parameters are consistent with Grade I diastolic dysfunction (impaired relaxation).  2. Right ventricular systolic function is normal. The right ventricular size is normal.  3. Left atrial size was mildly dilated.  4. The mitral valve is normal in structure. Mild mitral valve regurgitation. No evidence of mitral stenosis.  5. The aortic valve is tricuspid. Aortic valve regurgitation is mild. Aortic valve sclerosis/calcification is present, without any evidence of aortic stenosis.  6. The inferior vena cava is normal in size with greater than 50% respiratory variability, suggesting right atrial pressure of 3 mmHg. FINDINGS  Left Ventricle: Left ventricular ejection fraction, by estimation, is 55 to 60%. The left ventricle has normal function. The left ventricle has no regional wall motion abnormalities. The left ventricular internal cavity size was normal in size. There is  mild left ventricular hypertrophy. Left ventricular diastolic parameters are consistent with Grade I diastolic dysfunction (impaired relaxation). Right Ventricle:  The right ventricular size is normal. Right ventricular systolic function is normal. Left Atrium: Left atrial size was mildly dilated. Right Atrium: Right atrial size was normal in size. Pericardium: Trivial pericardial effusion is present. Mitral Valve: The mitral valve is normal in structure. Mild mitral annular calcification. Mild mitral valve regurgitation. No evidence of mitral valve stenosis. Tricuspid Valve: The tricuspid valve is normal in structure. Tricuspid valve regurgitation is trivial. No evidence of tricuspid stenosis. Aortic Valve: The aortic valve is tricuspid. Aortic valve regurgitation is mild. Aortic regurgitation PHT measures 400 msec. Aortic valve sclerosis/calcification is present, without any evidence of aortic stenosis.  Pulmonic Valve: The pulmonic valve was normal in structure. Pulmonic valve regurgitation is trivial. No evidence of pulmonic stenosis. Aorta: The aortic root is normal in size and structure. Venous: The inferior vena cava is normal in size with greater than 50% respiratory variability, suggesting right atrial pressure of 3 mmHg. IAS/Shunts: No atrial level shunt detected by color flow Doppler.  LEFT VENTRICLE PLAX 2D LVIDd:         5.20 cm   Diastology LVIDs:         3.10 cm   LV e' medial:    4.50 cm/s LV PW:         1.20 cm   LV E/e' medial:  12.0 LV IVS:        1.10 cm   LV e' lateral:   4.20 cm/s LVOT diam:     2.10 cm   LV E/e' lateral: 12.9 LV SV:         80 LV SV Index:   47 LVOT Area:     3.46 cm  RIGHT VENTRICLE             IVC RV S prime:     15.70 cm/s  IVC diam: 1.00 cm TAPSE (M-mode): 2.0 cm LEFT ATRIUM             Index        RIGHT ATRIUM           Index LA diam:        3.40 cm 2.02 cm/m   RA Area:     16.30 cm LA Vol (A2C):   79.2 ml 47.01 ml/m  RA Volume:   39.80 ml  23.62 ml/m LA Vol (A4C):   47.5 ml 28.20 ml/m LA Biplane Vol: 63.0 ml 37.40 ml/m  AORTIC VALVE LVOT Vmax:   102.00 cm/s LVOT Vmean:  67.600 cm/s LVOT VTI:    0.230 m AI PHT:      400 msec  AORTA Ao Root diam: 3.70 cm Ao Asc diam:  3.10 cm MITRAL VALVE MV Area (PHT): 4.60 cm    SHUNTS MV Decel Time: 165 msec    Systemic VTI:  0.23 m MR Peak grad: 134.6 mmHg   Systemic Diam: 2.10 cm MR Mean grad: 82.0 mmHg MR Vmax:      580.00 cm/s MR Vmean:     416.0 cm/s MV E velocity: 54.00 cm/s MV A velocity: 96.40 cm/s MV E/A ratio:  0.56 Kirk Ruths MD Electronically signed by Kirk Ruths MD Signature Date/Time: 12/30/2021/2:40:23 PM    Final    VAS US CAROTID  Result Date: 12/30/2021 Carotid Arterial Duplex Study Patient Name:  Chad [name]  Date of Exam:   12/30/2021 Medical Rec #: [id number]          Accession #:    [id number] Date of Birth: 07-01-1932  Patient Gender: M Patient Age:   79 years Exam Location:  [facility] Procedure:      VAS US CAROTID Referring Phys: Nyoka Lint DOUTOVA --------------------------------------------------------------------------------  Indications:       CVA. Risk Factors:      Hypertension. Comparison Study:  No prior study Performing Technologist: Maudry Mayhew MHA, RDMS, RVT, RDCS  Examination Guidelines: A complete evaluation includes B-mode imaging, spectral Doppler, color Doppler, and power Doppler as needed of all accessible portions of each vessel. Bilateral testing is considered an integral part of a complete examination. Limited examinations for reoccurring indications may be performed as noted.  Right Carotid Findings: +----------+-------+--------+--------+-------------------------------+---------+           PSV    EDV cm/sStenosisPlaque Description             Comments            cm/s                                                            +----------+-------+--------+--------+-------------------------------+---------+ CCA Prox  54     7               heterogenous and irregular               +----------+-------+--------+--------+-------------------------------+---------+ CCA Distal58     9               smooth and heterogenous                  +----------+-------+--------+--------+-------------------------------+---------+ ICA Prox  132    19              smooth, heterogenous and       Shadowing                                  calcific                                 +----------+-------+--------+--------+-------------------------------+---------+ ICA Distal78     15                                                       +----------+-------+--------+--------+-------------------------------+---------+ ECA       126                                                             +----------+-------+--------+--------+-------------------------------+---------+  +----------+--------+-------+----------------+-------------------+           PSV cm/sEDV cmsDescribe        Arm Pressure (mmHG) +----------+--------+-------+----------------+-------------------+ [id number]            Multiphasic, WNL                    +----------+--------+-------+----------------+-------------------+ +---------+--------+--+--------+-+---------+ VertebralPSV cm/s51EDV cm/s5Antegrade +---------+--------+--+--------+-+---------+  Left Carotid Findings: +----------+--------+--------+--------+------------------------------+---------+  PSV cm/sEDV cm/sStenosisPlaque Description            Comments  +----------+--------+--------+--------+------------------------------+---------+ CCA Prox  89                      heterogenous and irregular    tortuous  +----------+--------+--------+--------+------------------------------+---------+ CCA Distal70      8                                                       +----------+--------+--------+--------+------------------------------+---------+ ICA Prox  49      12              heterogenous, irregular and   Shadowing                                   calcific                                +----------+--------+--------+--------+------------------------------+---------+ ICA Distal98      18                                                      +----------+--------+--------+--------+------------------------------+---------+ ECA       67      10                                                      +----------+--------+--------+--------+------------------------------+---------+ +----------+--------+--------+----------------+-------------------+           PSV cm/sEDV cm/sDescribe        Arm Pressure (mmHG) +----------+--------+--------+----------------+-------------------+ [id number]             Multiphasic, WNL                     +----------+--------+--------+----------------+-------------------+ +---------+--------+--+--------+--+---------+ VertebralPSV cm/s94EDV cm/s14Antegrade +---------+--------+--+--------+--+---------+   Summary: Right Carotid: Velocities in the right ICA are consistent with a 1-39% stenosis. Left Carotid: Velocities in the left ICA are consistent with a 1-39% stenosis. Vertebrals:  Bilateral vertebral arteries demonstrate antegrade flow. Subclavians: Normal flow hemodynamics were seen in bilateral subclavian              arteries. *See table(s) above for measurements and observations.     Preliminary     Scheduled Meds:  aspirin EC  81 mg Oral Daily   enoxaparin (LOVENOX) injection  30 mg Subcutaneous Q24H   rosuvastatin  20 mg Oral Daily   tamsulosin  0.4 mg Oral Daily   ticagrelor  90 mg Oral BID   Continuous Infusions:  sodium chloride 75 mL/hr at 12/31/21 0359     LOS: 2 days   Shelly Coss, MD [facility] P5/30/2023, 10:42 AM

## 2021-12-31 NOTE — Progress Notes (Signed)
STROKE TEAM PROGRESS NOTE   SUBJECTIVE (INTERVAL HISTORY) His daughter is at the bedside. Pt lying in bed after the unsuccessful angiogram. He is asking to move his legs but was told to keep it straight for total 4 hours. He still has 1.5h to go. During the procedure, he kept moving his legs and his neck. Procedure aborted for safety concerns. Will attempt to have angio and potential stenting tomorrow under general anesthesia.    OBJECTIVE Temp:  [97.8 F (36.6 C)-98.7 F (37.1 C)] 98.1 F (36.7 C) (05/30 1548) Pulse Rate:  [73-86] 73 (05/30 1548) Cardiac Rhythm: Normal sinus rhythm (05/30 1325) Resp:  [14-22] 18 (05/30 1548) BP: (130-192)/(66-108) 165/79 (05/30 1548) SpO2:  [93 %-100 %] 100 % (05/30 1548)  Recent Labs  Lab 12/29/21 1658  GLUCAP 112*   Recent Labs  Lab 12/29/21 1700 12/29/21 1722 12/31/21 0357  NA 140 141 145  K 4.4 4.4 3.8  CL 108 109 114*  CO2 22  --  22  GLUCOSE 119* 118* 110*  BUN 35* 36* 27*  CREATININE 1.46* 1.40* 1.33*  CALCIUM 9.9  --  8.9  MG 1.9  --   --   PHOS 3.5  --   --    Recent Labs  Lab 12/29/21 1700  AST 25  ALT 12  ALKPHOS 56  BILITOT 0.8  PROT 7.6  ALBUMIN 3.4*   Recent Labs  Lab 12/29/21 1700 12/29/21 1722 12/31/21 0357  WBC 6.9  --  6.4  NEUTROABS 3.8  --   --   HGB 11.6* 11.6* 10.0*  HCT 35.6* 34.0* 30.7*  MCV 86.2  --  84.8  PLT 197  --  170   Recent Labs  Lab 12/29/21 1700 12/30/21 0412  CKTOTAL 328 227   Recent Labs    12/29/21 1700  LABPROT 16.7*  INR 1.4*   Recent Labs    12/29/21 2233  COLORURINE YELLOW  LABSPEC 1.011  PHURINE 5.0  GLUCOSEU NEGATIVE  HGBUR SMALL*  BILIRUBINUR NEGATIVE  KETONESUR NEGATIVE  PROTEINUR 30*  NITRITE NEGATIVE  LEUKOCYTESUR NEGATIVE       Component Value Date/Time   CHOL 149 12/30/2021 0412   TRIG 68 12/30/2021 0412   HDL 45 12/30/2021 0412   CHOLHDL 3.3 12/30/2021 0412   VLDL 14 12/30/2021 0412   LDLCALC 90 12/30/2021 0412   Lab Results  Component  Value Date   HGBA1C 6.1 (H) 12/29/2021   No results found for: LABOPIA, COCAINSCRNUR, LABBENZ, AMPHETMU, THCU, LABBARB  No results for input(s): ETH in the last 168 hours.  I have personally reviewed the radiological images below and agree with the radiology interpretations.  CT ANGIO HEAD NECK W WO CM  Result Date: 12/30/2021 CLINICAL DATA:  Stroke/TIA, determine embolic source EXAM: CT ANGIOGRAPHY HEAD AND NECK TECHNIQUE: Multidetector CT imaging of the head and neck was performed using the standard protocol during bolus administration of intravenous contrast. Multiplanar CT image reconstructions and MIPs were obtained to evaluate the vascular anatomy. Carotid stenosis measurements (when applicable) are obtained utilizing NASCET criteria, using the distal internal carotid diameter as the denominator. RADIATION DOSE REDUCTION: This exam was performed according to the departmental dose-optimization program which includes automated exposure control, adjustment of the mA and/or kV according to patient size and/or use of iterative reconstruction technique. CONTRAST:  63mL OMNIPAQUE IOHEXOL 350 MG/ML SOLN COMPARISON:  Same day MRI/MRA. FINDINGS: CT HEAD FINDINGS Brain: Small right MCA territory infarcts better characterized on same day MRI. No evidence of  acute hemorrhage, mass effect, midline shift, extra-axial fluid collection, or hydrocephalus. Patchy white matter hypodensities, nonspecific but compatible with chronic microvascular ischemic disease. Vascular: Detailed below. Skull: No acute fracture. Sinuses: Mild paranasal sinus mucosal thickening. Orbits: No acute orbital findings. Review of the MIP images confirms the above findings CTA NECK FINDINGS Aortic arch: Calcific atherosclerosis of the aorta and great vessel origins. Ectatic right subclavian artery. Right carotid system: Atherosclerosis at the carotid bifurcation with approximately 80% stenosis. Left carotid system: Atherosclerosis at the  carotid bifurcation without greater than 50% stenosis. Vertebral arteries: Potentially severe stenosis of the right vertebral artery origin due to calcific atherosclerosis. Otherwise, vertebral arteries are patent without significant (greater than 50%) stenosis. Left dominant. Skeleton: Severe multilevel degenerative change. Other neck: No acute findings. Upper chest: Clear sinuses. Review of the MIP images confirms the above findings CTA HEAD FINDINGS Anterior circulation: Bilateral intracranial ICAs are patent with mild narrowing due to calcific atherosclerosis. Bilateral M1 MCAs and left M2 MCAs are patent. Apparent occlusion of a right M2 MCA branch in a region of calcification. Bilateral ACAs are patent. Posterior circulation: Left dominant intradural vertebral artery. Right intradural vertebral artery makes very small contribution to the basilar artery. Basilar artery and bilateral posterior cerebral arteries are patent without proximal hemodynamically significant stenosis. Venous sinuses: As permitted by contrast timing, patent. Anatomic variants: Detailed above. Review of the MIP images confirms the above findings IMPRESSION: CTA: 1. 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 2. Extensive calcific atherosclerosis at the right carotid bifurcation with approximately 80% stenosis of the proximal ICA. 3. 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 CT head: Small right MCA territory infarcts better characterized on same day MRI. No evidence of progressive mass effect or acute hemorrhage. Findings discussed with Dr. Erlinda Hong via telephone at 12:50 p.m. Electronically Signed   By: Margaretha Sheffield M.D.   On: 12/30/2021 13:00   DG Elbow Complete Left  Result Date: 12/29/2021 CLINICAL DATA:  Fall.  Left elbow pain and  bruising. EXAM: LEFT ELBOW - COMPLETE 3+ VIEW COMPARISON:  None Available. FINDINGS: No fracture or dislocation. Narrowed radiocapitellar joint. Marginal osteophytes project from the base of the radial head. No joint effusion. There is soft tissue edema posteriorly. IMPRESSION: No fracture or dislocation. Electronically Signed   By: Lajean Manes M.D.   On: 12/29/2021 18:12   CT HEAD WO CONTRAST  Result Date: 12/29/2021 CLINICAL DATA:  Neurologic deficit EXAM: CT HEAD WITHOUT CONTRAST TECHNIQUE: Contiguous axial images were obtained from the base of the skull through the vertex without intravenous contrast. RADIATION DOSE REDUCTION: This exam was performed according to the departmental dose-optimization program which includes automated exposure control, adjustment of the mA and/or kV according to patient size and/or use of iterative reconstruction technique. COMPARISON:  None Available. FINDINGS: Brain: Confluent hypodensities throughout the periventricular white matter are most consistent with age-indeterminate small vessel ischemic changes, likely chronic. Focal hypodensities in the left basal ganglia and right frontal periventricular white matter consistent with chronic lacunar infarcts. Age-indeterminate lacunar infarct is seen within the right basal ganglia image 18/3. No other signs of acute infarct or hemorrhage. Lateral ventricles and midline structures are otherwise unremarkable. No acute extra-axial fluid collections. No mass effect. Vascular: No hyperdense vessel or unexpected calcification. Skull: Normal. Negative for fracture or focal lesion. Sinuses/Orbits: No acute finding. Other: None. IMPRESSION: 1. Age  indeterminate lacunar infarct within the right basal ganglia. 2. Age-indeterminate small-vessel ischemic changes throughout the periventricular white matter, favor chronic. 3. Chronic left basal ganglia and right frontal white matter lacunar infarcts as above. 4. No acute hemorrhage.  Electronically Signed   By: Randa Ngo M.D.   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 Multiplanar CT image reconstructions were also generated. RADIATION DOSE REDUCTION: This exam was performed according to the departmental dose-optimization program which includes automated exposure control, adjustment of the mA and/or kV according to patient size and/or use of iterative reconstruction technique. COMPARISON:  None Available. FINDINGS: Alignment: Mild degenerative anterolisthesis of C2 and C3. Otherwise alignment is anatomic. Skull base and vertebrae: No acute fracture. No primary bone lesion or focal pathologic process. Soft tissues and spinal canal: No prevertebral fluid or swelling. No visible canal hematoma. Disc levels: Partial bony fusion across the disc spaces at C3-4 and C4-5. There is severe multilevel spondylosis throughout the remainder of the cervical spine, greatest at C5-6 and C6-7. Diffuse facet hypertrophy. Upper chest: Airway is patent. Lung apices are clear. Prominent atherosclerosis of the aortic arch. Other: Reconstructed images demonstrate no additional findings. IMPRESSION: 1. Extensive multilevel cervical degenerative changes. No acute fracture. Electronically Signed   By: Randa Ngo M.D.   On: 12/29/2021 17:38   MR ANGIO HEAD WO CONTRAST  Result Date: 12/30/2021 CLINICAL DATA:  There are deficit with acute stroke suspected EXAM: MRI HEAD WITHOUT CONTRAST MRA HEAD WITHOUT CONTRAST TECHNIQUE: Multiplanar, multi-echo pulse sequences of the brain and surrounding structures were acquired without intravenous contrast. Angiographic images of the Circle of Willis were acquired using MRA technique without intravenous contrast. COMPARISON:  Head CT from  yesterday FINDINGS: MRI HEAD FINDINGS Brain: Cluster of small acute infarcts in the right insular cortex, right frontal cortex, and deep right cerebral white matter. These are in a MCA distribution. Background of advanced chronic small vessel ischemia with confluent gliosis in the hemispheric white matter. Chronic lacunar infarcts in the deep cerebrum. Small remote left cerebral infarct. Gradient signal at the lower right sylvian fissure with there is calcification along the MCA branches by CT. Vascular: Major flow voids are preserved Skull and upper cervical spine: Normal marrow signal Sinuses/Orbits: Bilateral cataract resection MRA HEAD FINDINGS Anterior circulation: High-grade narrowing involving the upper right M2 branch with there is a gradient hypointensity noted above. Elsewhere vessels are smoothly contoured and widely patent. Posterior circulation: Vertebrobasilar arteries are smoothly contoured and widely patent. IMPRESSION: Brain MRI: 1. Cluster of small cortical and white matter infarcts within the right MCA distribution. 2. Background of advanced chronic small vessel ischemia. Intracranial MRA: Focal high-grade narrowing at the right M2 level where there is calcification by CT. This could reflect calcified plaque or a calcified embolus. If an embolus, the proximal nature and degree of mild infarct with suggest additional territory of risk. Electronically Signed   By: Jorje Guild M.D.   On: 12/30/2021 07:53   MR BRAIN WO CONTRAST  Result Date: 12/30/2021 CLINICAL DATA:  There are deficit with acute stroke suspected EXAM: MRI HEAD WITHOUT CONTRAST MRA HEAD WITHOUT CONTRAST TECHNIQUE: Multiplanar, multi-echo pulse sequences of the brain and surrounding structures were acquired without intravenous contrast. Angiographic images of the Circle of Willis were acquired using MRA technique without intravenous contrast. COMPARISON:  Head CT from yesterday FINDINGS: MRI HEAD FINDINGS Brain: Cluster of  small acute infarcts in the right insular cortex, right frontal cortex, and deep right cerebral white matter. These are in a MCA distribution. Background of advanced chronic small vessel ischemia with confluent gliosis in the hemispheric white matter. Chronic lacunar infarcts in the deep cerebrum. Small remote left cerebral infarct. Gradient signal at the lower right sylvian fissure with there is calcification along the MCA branches by CT. Vascular: Major flow voids are preserved Skull and upper cervical spine: Normal marrow signal Sinuses/Orbits: Bilateral cataract resection MRA HEAD FINDINGS Anterior circulation: High-grade narrowing involving the upper right M2 branch with there is a gradient hypointensity noted above. Elsewhere vessels are smoothly contoured and widely patent. Posterior circulation: Vertebrobasilar arteries are smoothly contoured and widely patent. IMPRESSION: Brain MRI: 1. Cluster of small cortical and white matter infarcts within the right MCA distribution. 2. Background of advanced chronic small vessel ischemia. Intracranial MRA: Focal high-grade narrowing at the right M2 level where there is calcification by CT. This could reflect calcified plaque or a calcified embolus. If an embolus, the proximal nature and degree of mild infarct with suggest additional territory of risk. Electronically Signed   By: Jorje Guild M.D.   On: 12/30/2021 07:53   DG Chest Portable 1 View  Result Date: 12/29/2021 CLINICAL DATA:  CVA. Fell last night. Family concerned patient injured left side EXAM: PORTABLE CHEST 1 VIEW COMPARISON:  Chest two views 03/23/2013 FINDINGS: Cardiac silhouette is mildly enlarged. Moderate calcifications within the aortic arch and descending thoracic aorta. Minimal bibasilar horizontal linear chronic scarring is unchanged. No focal airspace opacity to indicate pneumonia. No pleural effusion or pneumothorax. Moderate multilevel degenerative disc changes of the thoracic spine.  IMPRESSION: Mild stable cardiomegaly. No acute lung process. Electronically Signed   By: Yvonne Kendall M.D.   On: 12/29/2021 19:19   DG Hand Complete Left  Result Date: 12/29/2021 CLINICAL DATA:  Fall.  Bruising to posterior left hand. EXAM: LEFT HAND - COMPLETE 3+ VIEW COMPARISON:  None Available. FINDINGS: There is diffuse decreased bone mineralization. A pulse oximeter overlies the distal index finger obscuring portions. Degenerative changes including joint space narrowing, subchondral sclerosis and peripheral osteophytosis are moderate at the triscaphe joint; moderate to severe at the thumb carpometacarpal, thumb metacarpophalangeal, and thumb interphalangeal joints; and moderate to severe at the DIP joints and moderate at the PIP joints of the second through fifth fingers. No acute fracture is seen. No dislocation. IMPRESSION: Osteoarthritis as above. No acute fracture. Electronically Signed   By: Yvonne Kendall M.D.   On: 12/29/2021 18:12   ECHOCARDIOGRAM COMPLETE  Result Date: 12/30/2021    ECHOCARDIOGRAM REPORT   Patient Name:   Chad [name] Date of Exam: 12/30/2021 Medical Rec #:  [id number]         Height:       66.0 in Accession #:    [id number]        Weight:       133.6 lb Date of Birth:  10/23/1931          BSA:          1.685 m Patient Age:    25 years          BP:           161/84 mmHg Patient Gender: M                 HR:  66 bpm. Exam Location:  Inpatient Procedure: 2D Echo Indications:    stroke  History:        Patient has prior history of Echocardiogram examinations, most                 recent 04/25/2020. Chronic kidney disease.; Risk                 Factors:Hypertension and Dyslipidemia.  Sonographer:    Johny Chess RDCS Referring Phys: [place]  1. Left ventricular ejection fraction, by estimation, is 55 to 60%. The left ventricle has normal function. The left ventricle has no regional wall motion abnormalities. There is mild left ventricular  hypertrophy. Left ventricular diastolic parameters are consistent with Grade I diastolic dysfunction (impaired relaxation).  2. Right ventricular systolic function is normal. The right ventricular size is normal.  3. Left atrial size was mildly dilated.  4. The mitral valve is normal in structure. Mild mitral valve regurgitation. No evidence of mitral stenosis.  5. The aortic valve is tricuspid. Aortic valve regurgitation is mild. Aortic valve sclerosis/calcification is present, without any evidence of aortic stenosis.  6. The inferior vena cava is normal in size with greater than 50% respiratory variability, suggesting right atrial pressure of 3 mmHg. FINDINGS  Left Ventricle: Left ventricular ejection fraction, by estimation, is 55 to 60%. The left ventricle has normal function. The left ventricle has no regional wall motion abnormalities. The left ventricular internal cavity size was normal in size. There is  mild left ventricular hypertrophy. Left ventricular diastolic parameters are consistent with Grade I diastolic dysfunction (impaired relaxation). Right Ventricle: The right ventricular size is normal. Right ventricular systolic function is normal. Left Atrium: Left atrial size was mildly dilated. Right Atrium: Right atrial size was normal in size. Pericardium: Trivial pericardial effusion is present. Mitral Valve: The mitral valve is normal in structure. Mild mitral annular calcification. Mild mitral valve regurgitation. No evidence of mitral valve stenosis. Tricuspid Valve: The tricuspid valve is normal in structure. Tricuspid valve regurgitation is trivial. No evidence of tricuspid stenosis. Aortic Valve: The aortic valve is tricuspid. Aortic valve regurgitation is mild. Aortic regurgitation PHT measures 400 msec. Aortic valve sclerosis/calcification is present, without any evidence of aortic stenosis. Pulmonic Valve: The pulmonic valve was normal in structure. Pulmonic valve regurgitation is trivial. No  evidence of pulmonic stenosis. Aorta: The aortic root is normal in size and structure. Venous: The inferior vena cava is normal in size with greater than 50% respiratory variability, suggesting right atrial pressure of 3 mmHg. IAS/Shunts: No atrial level shunt detected by color flow Doppler.  LEFT VENTRICLE PLAX 2D LVIDd:         5.20 cm   Diastology LVIDs:         3.10 cm   LV e' medial:    4.50 cm/s LV PW:         1.20 cm   LV E/e' medial:  12.0 LV IVS:        1.10 cm   LV e' lateral:   4.20 cm/s LVOT diam:     2.10 cm   LV E/e' lateral: 12.9 LV SV:         80 LV SV Index:   47 LVOT Area:     3.46 cm  RIGHT VENTRICLE             IVC RV S prime:     15.70 cm/s  IVC diam: 1.00 cm TAPSE (M-mode): 2.0 cm  LEFT ATRIUM             Index        RIGHT ATRIUM           Index LA diam:        3.40 cm 2.02 cm/m   RA Area:     16.30 cm LA Vol (A2C):   79.2 ml 47.01 ml/m  RA Volume:   39.80 ml  23.62 ml/m LA Vol (A4C):   47.5 ml 28.20 ml/m LA Biplane Vol: 63.0 ml 37.40 ml/m  AORTIC VALVE LVOT Vmax:   102.00 cm/s LVOT Vmean:  67.600 cm/s LVOT VTI:    0.230 m AI PHT:      400 msec  AORTA Ao Root diam: 3.70 cm Ao Asc diam:  3.10 cm MITRAL VALVE MV Area (PHT): 4.60 cm    SHUNTS MV Decel Time: 165 msec    Systemic VTI:  0.23 m MR Peak grad: 134.6 mmHg   Systemic Diam: 2.10 cm MR Mean grad: 82.0 mmHg MR Vmax:      580.00 cm/s MR Vmean:     416.0 cm/s MV E velocity: 54.00 cm/s MV A velocity: 96.40 cm/s MV E/A ratio:  0.56 Kirk Ruths MD Electronically signed by Kirk Ruths MD Signature Date/Time: 12/30/2021/2:40:23 PM    Final    VAS US CAROTID  Result Date: 12/31/2021 Carotid Arterial Duplex Study Patient Name:  Chad [name]  Date of Exam:   12/30/2021 Medical Rec #: [id number]          Accession #:    [id number] Date of Birth: 1932-05-22           Patient Gender: M Patient Age:   15 years Exam Location:  [facility] Procedure:      VAS US CAROTID Referring Phys: Nyoka Lint DOUTOVA  --------------------------------------------------------------------------------  Indications:       CVA. Risk Factors:      Hypertension. Comparison Study:  No prior study Performing Technologist: Maudry Mayhew MHA, RDMS, RVT, RDCS  Examination Guidelines: A complete evaluation includes B-mode imaging, spectral Doppler, color Doppler, and power Doppler as needed of all accessible portions of each vessel. Bilateral testing is considered an integral part of a complete examination. Limited examinations for reoccurring indications may be performed as noted.  Right Carotid Findings: +----------+-------+--------+--------+-------------------------------+---------+           PSV    EDV cm/sStenosisPlaque Description             Comments            cm/s                                                            +----------+-------+--------+--------+-------------------------------+---------+ CCA Prox  54     7               heterogenous and irregular               +----------+-------+--------+--------+-------------------------------+---------+ CCA Distal58     9               smooth and heterogenous                  +----------+-------+--------+--------+-------------------------------+---------+ ICA Prox  132    19  smooth, heterogenous and       Shadowing                                  calcific                                 +----------+-------+--------+--------+-------------------------------+---------+ ICA Distal78     15                                                       +----------+-------+--------+--------+-------------------------------+---------+ ECA       126                                                             +----------+-------+--------+--------+-------------------------------+---------+ +----------+--------+-------+----------------+-------------------+           PSV cm/sEDV cmsDescribe        Arm Pressure (mmHG)  +----------+--------+-------+----------------+-------------------+ [id number]            Multiphasic, WNL                    +----------+--------+-------+----------------+-------------------+ +---------+--------+--+--------+-+---------+ VertebralPSV cm/s51EDV cm/s5Antegrade +---------+--------+--+--------+-+---------+  Left Carotid Findings: +----------+--------+--------+--------+------------------------------+---------+           PSV cm/sEDV cm/sStenosisPlaque Description            Comments  +----------+--------+--------+--------+------------------------------+---------+ CCA Prox  89                      heterogenous and irregular    tortuous  +----------+--------+--------+--------+------------------------------+---------+ CCA Distal70      8                                                       +----------+--------+--------+--------+------------------------------+---------+ ICA Prox  49      12              heterogenous, irregular and   Shadowing                                   calcific                                +----------+--------+--------+--------+------------------------------+---------+ ICA Distal98      18                                                      +----------+--------+--------+--------+------------------------------+---------+ ECA       67      10                                                      +----------+--------+--------+--------+------------------------------+---------+ +----------+--------+--------+----------------+-------------------+  PSV cm/sEDV cm/sDescribe        Arm Pressure (mmHG) +----------+--------+--------+----------------+-------------------+ [id number]             Multiphasic, WNL                    +----------+--------+--------+----------------+-------------------+ +---------+--------+--+--------+--+---------+ VertebralPSV cm/s94EDV cm/s14Antegrade  +---------+--------+--+--------+--+---------+   Summary: Right Carotid: Velocities in the right ICA are consistent with a 1-39% stenosis. Left Carotid: Velocities in the left ICA are consistent with a 1-39% stenosis. Vertebrals:  Bilateral vertebral arteries demonstrate antegrade flow. Subclavians: Normal flow hemodynamics were seen in bilateral subclavian              arteries. *See table(s) above for measurements and observations.  Electronically signed by Antony Contras MD on 12/31/2021 at 3:59:42 PM.    Final      PHYSICAL EXAM  Temp:  [97.8 F (36.6 C)-98.7 F (37.1 C)] 98.1 F (36.7 C) (05/30 1548) Pulse Rate:  [73-86] 73 (05/30 1548) Resp:  [14-22] 18 (05/30 1548) BP: (130-192)/(66-108) 165/79 (05/30 1548) SpO2:  [93 %-100 %] 100 % (05/30 1548)  General - Well nourished, well developed, in no apparent distress.  Ophthalmologic - fundi not visualized due to noncooperation.  Cardiovascular - Regular rhythm and rate.  Neuro - awake, alert, eyes open, hard of hearing, orientated to age, place, time and people. No aphasia, paucity of speech, moderate dysarthria, following all simple commands. Able to name and repeat with dysarthric voice. No gaze palsy, tracking bilaterally, visual field full but with possible left simultagnosia, PERRL. Left facial droop. Tongue midline. RUE at least 4/5 and LUE 2-/5 bicep, otherwise 0/5. RLE 4/5, LLE 3/5 proximal and 2/5 distal DF. Sensation symmetrical bilaterally but not cooperative on sensation inattention testing, right FTN intact grossly, gait not tested.     ASSESSMENT/PLAN Chad [name] is a 88 y.o. male with history of hypertension, hyperlipidemia, hearing loss admitted for left-sided weakness and fell at home. No tPA given due to outside window.    Stroke:  right MCA scattered small infarcts due to right M2 occlusion by calcified plaque,  secondary to large vessel disease source from right ICA high-grade stenosis CT no acute abnormality,  old bilateral BG, right frontal white matter lacunar infarcts. MRA right M 2 high-grade stenosis MRI right MCA scattered small infarcts CT head and neck proximal right M2 occlusion due to calcified embolus.  Extensive calcified atherosclerosis at right ICA bifurcation with 80% stenosis.  Severe stenosis nondominant right VA origin.  Carotid Doppler unremarkable 2D Echo EF 55 to 60% LDL 90 HgbA1c 6.1 Lovenox for VTE prophylaxis No antithrombotic prior to admission, now on aspirin 325 mg daily and clopidogrel 75 mg daily DAPT.  Patient counseled to be compliant with his antithrombotic medications Ongoing aggressive stroke risk factor management Therapy recommendations:  CIR Disposition:  pending  Carotid stenosis CT head and neck extensive calcified atherosclerosis at right ICA bifurcation with 80% stenosis. Likely the source for right M2 occlusion and right MCA stroke Dr. Estanislado Pandy on board Unsuccessful angio 5/30 due to moving too much during procedure plan for angiogram and potential right ICA stenting in am under general anesthesia. On DAPT  Hypertension Stable BP goal 130-160 before carotid revascularization Avoid low BP  Hyperlipidemia Home meds: Lovastatin 40 LDL 90, goal < 70 Now on Crestor 20 Continue statin at discharge  Other Stroke Risk Factors Advanced age  Other Active Problems Hard of hearing Independent lives alone before admission  Hospital day # 2  Rosalin Hawking, MD PhD Stroke Neurology 12/31/2021 4:28 PM    To contact Stroke Continuity provider, please refer to [email or web address] After hours, contact General Neurology

## 2021-12-31 NOTE — Progress Notes (Signed)
Inpatient Rehab Admissions Coordinator:   Met with patient and his daughter at the bedside to discuss CIR recommendations and goals/expectations of CIR stay.  I reviewed 3 hrs/day of therapy, physician following, and average length of stay ~2 weeks (depending on progress) with goals of supervision.  Per daughter, plan for pt to come stay with her at discharge and she can provide 24/7 supervision.  We reviewed insurance auth process and I will start this today.    Shann Medal, PT, DPT Admissions Coordinator [phone] 12/31/21  3:06 PM

## 2021-12-31 NOTE — Sedation Documentation (Signed)
Attempted to call report to 3W, no RN available to take report. Physical handoff is also planned so if no call back will give a bedside report.

## 2021-12-31 NOTE — Progress Notes (Signed)
Physical Therapy Treatment Patient Details Name: Chad [name] MRN: [id number] DOB: 1931/11/26 Today's Date: 12/31/2021   History of Present Illness 88 yo male presenting to the ED on 5/28 with L sided weakness, facial droop, slurred speech, and fall. 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 PMH including hypothyroidism chronic pain syndromes, CKD, HTN, arthritis, bil TKA.    PT Comments    Pt was very participative, following multimodal cues well given time.  Recovered appropriately from muscle fatigue and handled a 40 min session well.  Emphasis on transitions, scooting, transfer safety with stress on coming more forward, standing/balance activities, sitting balance and transferred via stand pivot to the chair.    Recommendations for follow up therapy are one component of a multi-disciplinary discharge planning process, led by the attending physician.  Recommendations may be updated based on patient status, additional functional criteria and insurance authorization.  Follow Up Recommendations  Acute inpatient rehab (3hours/day)     Assistance Recommended at Discharge Frequent or constant Supervision/Assistance  Patient can return home with the following A lot of help with walking and/or transfers;A lot of help with bathing/dressing/bathroom;Assistance with cooking/housework;Help with stairs or ramp for entrance;Assist for transportation   Equipment Recommendations  Other (comment)    Recommendations for Other Services       Precautions / Restrictions Precautions Precautions: Fall     Mobility  Bed Mobility Overal bed mobility: Needs Assistance Bed Mobility: Rolling, Sidelying to Sit Rolling: Mod assist Sidelying to sit: Mod assist       General bed mobility comments: with multimodal cues, pt needed mod assist to facilitate roll to R and truncal assist up from R UE, pt cued to asisst L LE with R LE.     Transfers Overall transfer level: Needs assistance Equipment used: Rolling walker (2 wheels) Transfers: Sit to/from Stand Sit to Stand: Mod assist           General transfer comment: worked on safe appropriate starting position, coming forward until far enough to start standing and boost to standing    Ambulation/Gait               General Gait Details: not able yet   Stairs             Wheelchair Mobility    Modified Rankin (Stroke Patients Only) Modified Rankin (Stroke Patients Only) Pre-Morbid Rankin Score: No symptoms Modified Rankin: Severe disability     Balance Overall balance assessment: Needs assistance Sitting-balance support: No upper extremity supported, Feet supported Sitting balance-Leahy Scale: Fair Sitting balance - Comments: worked on moving in/out of BOS and re-finding midline and upright posture.  Worked on truncal rotation back to midline over 10 min.   Standing balance support: Single extremity supported, Bilateral upper extremity supported, During functional activity Standing balance-Leahy Scale: Poor Standing balance comment: Reliant on UE and external support. L lateral lean in standing.  2 trial of over 1-2 min working on w/shifting with intent of finding and holding midline with dtr used as visual feedback.  pt not able to adequately unload his L LE to step in place, but was able to bear weight enough to lift the R LE without L LE hyperextension.                            Cognition Arousal/Alertness: Awake/alert Behavior During Therapy: WFL for tasks assessed/performed Overall Cognitive Status: Impaired/Different from baseline  Area of Impairment: Attention, Memory, Following commands, Safety/judgement, Awareness, Problem solving                   Current Attention Level: Sustained Memory: Decreased short-term memory Following Commands: Follows one step commands with increased time, Follows one step commands  inconsistently Safety/Judgement: Decreased awareness of deficits, Decreased awareness of safety Awareness: Intellectual Problem Solving: Slow processing, Requires verbal cues, Requires tactile cues General Comments: Pt requiring increased time throughout session. Very pelasant and agreable to therapy. Decreased initation and benefiting from tactile cues. .        Exercises Other Exercises Other Exercises: graded assist/resistance of all 4 extremities for warm up and to assess L side function.    General Comments General comments (skin integrity, edema, etc.): Dtr and son in law witnessed/participated in therapy.      Pertinent Vitals/Pain Pain Assessment Pain Assessment: Faces Faces Pain Scale: No hurt Pain Intervention(s): Monitored during session    Home Living                          Prior Function            PT Goals (current goals can now be found in the care plan section) Acute Rehab PT Goals Patient Stated Goal: to get stronger PT Goal Formulation: With patient/family Time For Goal Achievement: 01/13/22 Potential to Achieve Goals: Good Progress towards PT goals: Progressing toward goals    Frequency    Min 4X/week      PT Plan Current plan remains appropriate    Co-evaluation              AM-PAC PT "6 Clicks" Mobility   Outcome Measure  Help needed turning from your back to your side while in a flat bed without using bedrails?: A Little Help needed moving from lying on your back to sitting on the side of a flat bed without using bedrails?: A Lot Help needed moving to and from a bed to a chair (including a wheelchair)?: A Lot Help needed standing up from a chair using your arms (e.g., wheelchair or bedside chair)?: A Lot Help needed to walk in hospital room?: Total Help needed climbing 3-5 steps with a railing? : Total 6 Click Score: 11    End of Session Equipment Utilized During Treatment: Gait belt Activity Tolerance: Patient  tolerated treatment well Patient left: in chair;with call bell/phone within reach;with chair alarm set;with family/visitor present Nurse Communication: Mobility status PT Visit Diagnosis: Other abnormalities of gait and mobility (R26.89);Unsteadiness on feet (R26.81);Muscle weakness (generalized) (M62.81);Other symptoms and signs involving the nervous system (R29.898)     Time: 2202-5427 PT Time Calculation (min) (ACUTE ONLY): 42 min  Charges:  $Therapeutic Exercise: 8-22 mins $Therapeutic Activity: 8-22 mins $Neuromuscular Re-education: 8-22 mins                     12/31/2021  Ginger Carne., PT Acute Rehabilitation Services [phone]  (pager) [phone]  (office)   Tessie Fass Emri Sample 12/31/2021, 11:24 AM

## 2021-12-31 NOTE — Sedation Documentation (Signed)
Sheath pulled at 1305 from right femoral site. Pressure being held.

## 2021-12-31 NOTE — TOC Benefit Eligibility Note (Signed)
Patient Teacher, English as a foreign language completed.    The patient is currently admitted and upon discharge could be taking Brilinta 90 mg.  The current 30 day co-pay is, $441.25 due to a $394.25 deductible remaining.  Will be $47.00 once deductible is met.   The patient is insured through [place], [place] Patient Advocate Specialist [place] Patient Advocate Team Direct Number: [phone]  Fax: [phone]

## 2021-12-31 NOTE — Progress Notes (Signed)
SLP Cancellation Note  Patient Details Name: Chad [name] MRN: [id number] DOB: 1932-07-03   Cancelled evaluation: MBS was scheduled for 10 am, however, pt is NPO for IR procedure at noon.  Will attempt to reschedule for this afternoon or tomorrow.  Makenah Karas L. Tivis Ringer, [place] Office number [phone] Pager [phone]          Assunta Curtis 12/31/2021, 9:57 AM

## 2021-12-31 NOTE — Procedures (Signed)
INR. Innominate arteriogram.   Right CFA approach. Procedure abandoned due to patient agitation and movement precluding safe continuation of the study. Manual compression with quick clot held at the right common femoral artery puncture site.  Arlean Hopping  MD.

## 2022-01-01 ENCOUNTER — Encounter (HOSPITAL_COMMUNITY)
Admission: EM | Disposition: A | Discharge status: Rehabilitation Facility | Payer: Self-pay | Source: Home / Self Care | Attending: Internal Medicine

## 2022-01-01 ENCOUNTER — Inpatient Hospital Stay (HOSPITAL_COMMUNITY): Payer: Medicare Other | Admitting: Certified Registered Nurse Anesthetist

## 2022-01-01 ENCOUNTER — Other Ambulatory Visit: Payer: Self-pay

## 2022-01-01 ENCOUNTER — Encounter (HOSPITAL_COMMUNITY): Payer: Self-pay | Admitting: Internal Medicine

## 2022-01-01 ENCOUNTER — Inpatient Hospital Stay (HOSPITAL_COMMUNITY): Payer: Medicare Other

## 2022-01-01 DIAGNOSIS — N183 Chronic kidney disease, stage 3 unspecified: Secondary | ICD-10-CM

## 2022-01-01 DIAGNOSIS — I6521 Occlusion and stenosis of right carotid artery: Secondary | ICD-10-CM | POA: Diagnosis present

## 2022-01-01 DIAGNOSIS — D631 Anemia in chronic kidney disease: Secondary | ICD-10-CM

## 2022-01-01 DIAGNOSIS — I129 Hypertensive chronic kidney disease with stage 1 through stage 4 chronic kidney disease, or unspecified chronic kidney disease: Secondary | ICD-10-CM

## 2022-01-01 DIAGNOSIS — I63231 Cerebral infarction due to unspecified occlusion or stenosis of right carotid arteries: Secondary | ICD-10-CM

## 2022-01-01 HISTORY — PX: IR PTA INTRACRANIAL: IMG2344

## 2022-01-01 HISTORY — PX: IR ANGIO INTRA EXTRACRAN SEL COM CAROTID INNOMINATE UNI R MOD SED: IMG5359

## 2022-01-01 HISTORY — PX: RADIOLOGY WITH ANESTHESIA: SHX6223

## 2022-01-01 HISTORY — PX: IR CT HEAD LTD: IMG2386

## 2022-01-01 LAB — HEPARIN LEVEL (UNFRACTIONATED): Heparin Unfractionated: <0.1 IU/mL — ABNORMAL LOW (ref 0.30–0.70)

## 2022-01-01 LAB — POCT ACTIVATED CLOTTING TIME: Activated Clotting Time: 203 seconds

## 2022-01-01 LAB — MRSA NEXT GEN BY PCR, NASAL: MRSA by PCR Next Gen: NOT DETECTED

## 2022-01-01 SURGERY — IR WITH ANESTHESIA
Anesthesia: General

## 2022-01-01 MED ORDER — ACETAMINOPHEN 160 MG/5ML PO SOLN: 650.0000 mg | ORAL | Status: DC | PRN

## 2022-01-01 MED ORDER — AMLODIPINE BESYLATE 5 MG PO TABS
5.0000 mg | ORAL_TABLET | Freq: Every day | ORAL | Status: DC
  Administered 2022-01-02: 5 mg via ORAL
  Filled 2022-01-01: qty 1

## 2022-01-01 MED ORDER — FENTANYL CITRATE (PF) 250 MCG/5ML IJ SOLN
INTRAMUSCULAR | Status: DC | PRN
  Administered 2022-01-01: 25 ug via INTRAVENOUS
  Administered 2022-01-01: 100 ug via INTRAVENOUS

## 2022-01-01 MED ORDER — CHLORHEXIDINE GLUCONATE 0.12 % MT SOLN
OROMUCOSAL | Status: AC
  Administered 2022-01-01: 15 mL
  Filled 2022-01-01: qty 15

## 2022-01-01 MED ORDER — DEXAMETHASONE SODIUM PHOSPHATE 10 MG/ML IJ SOLN
INTRAMUSCULAR | Status: DC | PRN
  Administered 2022-01-01: 5 mg via INTRAVENOUS

## 2022-01-01 MED ORDER — ESMOLOL HCL 100 MG/10ML IV SOLN
INTRAVENOUS | Status: DC | PRN
  Administered 2022-01-01 (×2): 20 mg via INTRAVENOUS

## 2022-01-01 MED ORDER — HEPARIN (PORCINE) 25000 UT/250ML-% IV SOLN
500.0000 [IU]/h | INTRAVENOUS | Status: DC
  Administered 2022-01-01: 500 [IU]/h via INTRAVENOUS

## 2022-01-01 MED ORDER — CHLORHEXIDINE GLUCONATE CLOTH 2 % EX PADS
6.0000 | MEDICATED_PAD | Freq: Every day | CUTANEOUS | Status: DC
  Administered 2022-01-01 – 2022-01-02 (×2): 6 via TOPICAL

## 2022-01-01 MED ORDER — PHENYLEPHRINE HCL-NACL 20-0.9 MG/250ML-% IV SOLN
INTRAVENOUS | Status: DC | PRN
  Administered 2022-01-01: 50 ug/min via INTRAVENOUS

## 2022-01-01 MED ORDER — AMISULPRIDE (ANTIEMETIC) 5 MG/2ML IV SOLN: 10.0000 mg | Freq: Once | INTRAVENOUS | Status: DC | PRN

## 2022-01-01 MED ORDER — ONDANSETRON HCL 4 MG/2ML IJ SOLN
INTRAMUSCULAR | Status: DC | PRN
  Administered 2022-01-01: 4 mg via INTRAVENOUS

## 2022-01-01 MED ORDER — ASPIRIN 81 MG PO CHEW: 81.0000 mg | CHEWABLE_TABLET | Freq: Every day | ORAL | Status: DC

## 2022-01-01 MED ORDER — HEPARIN (PORCINE) 25000 UT/250ML-% IV SOLN
INTRAVENOUS | Status: AC
  Filled 2022-01-01: qty 250

## 2022-01-01 MED ORDER — SODIUM CHLORIDE 0.9 % IV SOLN: INTRAVENOUS | Status: DC

## 2022-01-01 MED ORDER — HEPARIN (PORCINE) 25000 UT/250ML-% IV SOLN
600.0000 [IU]/h | INTRAVENOUS | Status: AC
  Administered 2022-01-01: 600 [IU]/h via INTRAVENOUS

## 2022-01-01 MED ORDER — TICAGRELOR 90 MG PO TABS
90.0000 mg | ORAL_TABLET | Freq: Two times a day (BID) | ORAL | Status: DC
  Administered 2022-01-01 – 2022-01-02 (×2): 90 mg via ORAL
  Filled 2022-01-01 (×2): qty 1

## 2022-01-01 MED ORDER — ENOXAPARIN SODIUM 30 MG/0.3ML IJ SOSY: 30.0000 mg | PREFILLED_SYRINGE | INTRAMUSCULAR | Status: DC

## 2022-01-01 MED ORDER — CLEVIDIPINE BUTYRATE 0.5 MG/ML IV EMUL
0.0000 mg/h | INTRAVENOUS | Status: AC
  Administered 2022-01-01 (×2): 8 mg/h via INTRAVENOUS
  Administered 2022-01-01: 6 mg/h via INTRAVENOUS
  Administered 2022-01-01: 9 mg/h via INTRAVENOUS
  Administered 2022-01-02: 10 mg/h via INTRAVENOUS
  Administered 2022-01-02 (×3): 13 mg/h via INTRAVENOUS
  Administered 2022-01-02: 14 mg/h via INTRAVENOUS
  Filled 2022-01-01 (×8): qty 50

## 2022-01-01 MED ORDER — HEPARIN SODIUM (PORCINE) 1000 UNIT/ML IJ SOLN
INTRAMUSCULAR | Status: DC | PRN
  Administered 2022-01-01: 3000 [IU] via INTRAVENOUS

## 2022-01-01 MED ORDER — EPHEDRINE SULFATE-NACL 50-0.9 MG/10ML-% IV SOSY
PREFILLED_SYRINGE | INTRAVENOUS | Status: DC | PRN
  Administered 2022-01-01: 5 mg via INTRAVENOUS

## 2022-01-01 MED ORDER — CLEVIDIPINE BUTYRATE 0.5 MG/ML IV EMUL
INTRAVENOUS | Status: DC | PRN
  Administered 2022-01-01: 2 mg/h via INTRAVENOUS

## 2022-01-01 MED ORDER — EPTIFIBATIDE 20 MG/10ML IV SOLN
INTRAVENOUS | Status: AC
  Filled 2022-01-01: qty 10

## 2022-01-01 MED ORDER — PROPOFOL 10 MG/ML IV BOLUS
INTRAVENOUS | Status: DC | PRN
  Administered 2022-01-01: 100 mg via INTRAVENOUS

## 2022-01-01 MED ORDER — IOHEXOL 300 MG/ML  SOLN
100.0000 mL | Freq: Once | INTRAMUSCULAR | Status: AC | PRN
  Administered 2022-01-01: 60 mL via INTRA_ARTERIAL

## 2022-01-01 MED ORDER — PHENYLEPHRINE 80 MCG/ML (10ML) SYRINGE FOR IV PUSH (FOR BLOOD PRESSURE SUPPORT)
PREFILLED_SYRINGE | INTRAVENOUS | Status: DC | PRN
  Administered 2022-01-01: 160 ug via INTRAVENOUS

## 2022-01-01 MED ORDER — LIDOCAINE 2% (20 MG/ML) 5 ML SYRINGE
INTRAMUSCULAR | Status: DC | PRN
  Administered 2022-01-01: 40 mg via INTRAVENOUS
  Administered 2022-01-01: 60 mg via INTRAVENOUS

## 2022-01-01 MED ORDER — ACETAMINOPHEN 650 MG RE SUPP: 650.0000 mg | RECTAL | Status: DC | PRN

## 2022-01-01 MED ORDER — ASPIRIN 81 MG PO CHEW
81.0000 mg | CHEWABLE_TABLET | Freq: Every day | ORAL | Status: DC
  Administered 2022-01-02: 81 mg via ORAL
  Filled 2022-01-01: qty 1

## 2022-01-01 MED ORDER — NITROGLYCERIN 1 MG/10 ML FOR IR/CATH LAB
INTRA_ARTERIAL | Status: AC
  Filled 2022-01-01: qty 10

## 2022-01-01 MED ORDER — FENTANYL CITRATE (PF) 100 MCG/2ML IJ SOLN: 25.0000 ug | INTRAMUSCULAR | Status: DC | PRN

## 2022-01-01 MED ORDER — ACETAMINOPHEN 325 MG PO TABS: 650.0000 mg | ORAL_TABLET | ORAL | Status: DC | PRN

## 2022-01-01 MED ORDER — ROCURONIUM BROMIDE 10 MG/ML (PF) SYRINGE
PREFILLED_SYRINGE | INTRAVENOUS | Status: DC | PRN
  Administered 2022-01-01: 30 mg via INTRAVENOUS
  Administered 2022-01-01 (×2): 50 mg via INTRAVENOUS

## 2022-01-01 MED ORDER — TICAGRELOR 90 MG PO TABS: 90.0000 mg | ORAL_TABLET | Freq: Two times a day (BID) | ORAL | Status: DC

## 2022-01-01 MED ORDER — SUGAMMADEX SODIUM 200 MG/2ML IV SOLN
INTRAVENOUS | Status: DC | PRN
  Administered 2022-01-01: 200 mg via INTRAVENOUS

## 2022-01-01 MED ORDER — ATENOLOL 100 MG PO TABS
100.0000 mg | ORAL_TABLET | Freq: Every morning | ORAL | Status: DC
  Administered 2022-01-02: 100 mg via ORAL
  Filled 2022-01-01 (×2): qty 1

## 2022-01-01 NOTE — Progress Notes (Signed)
[place] for heparin Indication:  Post Interventional Neuroradiology Procedure  Heparin Dosing Weight: 59 kg  Labs: Recent Labs    12/30/21 0412 12/31/21 0357 01/01/22 2236  HGB  --  10.0*  --   HCT  --  30.7*  --   PLT  --  170  --   HEPARINUNFRC  --   --  <0.10*  CREATININE  --  1.33*  --   CKTOTAL 227  --   --      Assessment: 88 y.o. male s/p R ICA angioplasty for heparin  Goal of Therapy:  Heparin level 0.1-0.25 units/ml Monitor platelets by anticoagulation protocol: Yes   Plan:  Increase Heparin 600 units/hr  Phillis Knack, PharmD, BCPS  01/01/2022 11:18 PM

## 2022-01-01 NOTE — Progress Notes (Signed)
PT Cancellation Note  Patient Details Name: Chad [name] MRN: [id number] DOB: 1932/06/29   Cancelled Treatment:    Reason Eval/Treat Not Completed: Patient at procedure or test/unavailable   [name] [name] 01/01/2022, 8:25 AM [name], PT Acute Rehabilitation Services Pager: [phone] Office: [phone]

## 2022-01-01 NOTE — Progress Notes (Signed)
STROKE TEAM PROGRESS NOTE   SUBJECTIVE (INTERVAL HISTORY) His daughter is at the bedside. Pt lying in bed after right ICA angioplasty.  Not able to perform right ICA stenting given severe tortuosity of the vessel.   OBJECTIVE Temp:  [98 F (36.7 C)-100.8 F (38.2 C)] 98 F (36.7 C) (05/31 1405) Pulse Rate:  [77-104] 99 (05/31 1530) Cardiac Rhythm: Normal sinus rhythm (05/31 1350) Resp:  [12-23] 22 (05/31 1530) BP: (117-224)/(62-193) 131/79 (05/31 1530) SpO2:  [78 %-100 %] 81 % (05/31 1530) Arterial Line BP: (133-178)/(48-71) 149/58 (05/31 1530) Weight:  [59 kg] 59 kg (05/31 0805)  Recent Labs  Lab 12/29/21 1658  GLUCAP 112*   Recent Labs  Lab 12/29/21 1700 12/29/21 1722 12/31/21 0357  NA 140 141 145  K 4.4 4.4 3.8  CL 108 109 114*  CO2 22  --  22  GLUCOSE 119* 118* 110*  BUN 35* 36* 27*  CREATININE 1.46* 1.40* 1.33*  CALCIUM 9.9  --  8.9  MG 1.9  --   --   PHOS 3.5  --   --    Recent Labs  Lab 12/29/21 1700  AST 25  ALT 12  ALKPHOS 56  BILITOT 0.8  PROT 7.6  ALBUMIN 3.4*   Recent Labs  Lab 12/29/21 1700 12/29/21 1722 12/31/21 0357  WBC 6.9  --  6.4  NEUTROABS 3.8  --   --   HGB 11.6* 11.6* 10.0*  HCT 35.6* 34.0* 30.7*  MCV 86.2  --  84.8  PLT 197  --  170   Recent Labs  Lab 12/29/21 1700 12/30/21 0412  CKTOTAL 328 227   Recent Labs    12/29/21 1700  LABPROT 16.7*  INR 1.4*   Recent Labs    12/29/21 2233  COLORURINE YELLOW  LABSPEC 1.011  PHURINE 5.0  GLUCOSEU NEGATIVE  HGBUR SMALL*  BILIRUBINUR NEGATIVE  KETONESUR NEGATIVE  PROTEINUR 30*  NITRITE NEGATIVE  LEUKOCYTESUR NEGATIVE       Component Value Date/Time   CHOL 149 12/30/2021 0412   TRIG 68 12/30/2021 0412   HDL 45 12/30/2021 0412   CHOLHDL 3.3 12/30/2021 0412   VLDL 14 12/30/2021 0412   LDLCALC 90 12/30/2021 0412   Lab Results  Component Value Date   HGBA1C 6.1 (H) 12/29/2021   No results found for: LABOPIA, COCAINSCRNUR, LABBENZ, AMPHETMU, THCU, LABBARB  No  results for input(s): ETH in the last 168 hours.  I have personally reviewed the radiological images below and agree with the radiology interpretations.  CT ANGIO HEAD NECK W WO CM  Result Date: 12/30/2021 CLINICAL DATA:  Stroke/TIA, determine embolic source EXAM: CT ANGIOGRAPHY HEAD AND NECK TECHNIQUE: Multidetector CT imaging of the head and neck was performed using the standard protocol during bolus administration of intravenous contrast. Multiplanar CT image reconstructions and MIPs were obtained to evaluate the vascular anatomy. Carotid stenosis measurements (when applicable) are obtained utilizing NASCET criteria, using the distal internal carotid diameter as the denominator. RADIATION DOSE REDUCTION: This exam was performed according to the departmental dose-optimization program which includes automated exposure control, adjustment of the mA and/or kV according to patient size and/or use of iterative reconstruction technique. CONTRAST:  26mL OMNIPAQUE IOHEXOL 350 MG/ML SOLN COMPARISON:  Same day MRI/MRA. FINDINGS: CT HEAD FINDINGS Brain: Small right MCA territory infarcts better characterized on same day MRI. No evidence of acute hemorrhage, mass effect, midline shift, extra-axial fluid collection, or hydrocephalus. Patchy white matter hypodensities, nonspecific but compatible with chronic microvascular ischemic disease. Vascular:  Detailed below. Skull: No acute fracture. Sinuses: Mild paranasal sinus mucosal thickening. Orbits: No acute orbital findings. Review of the MIP images confirms the above findings CTA NECK FINDINGS Aortic arch: Calcific atherosclerosis of the aorta and great vessel origins. Ectatic right subclavian artery. Right carotid system: Atherosclerosis at the carotid bifurcation with approximately 80% stenosis. Left carotid system: Atherosclerosis at the carotid bifurcation without greater than 50% stenosis. Vertebral arteries: Potentially severe stenosis of the right vertebral artery  origin due to calcific atherosclerosis. Otherwise, vertebral arteries are patent without significant (greater than 50%) stenosis. Left dominant. Skeleton: Severe multilevel degenerative change. Other neck: No acute findings. Upper chest: Clear sinuses. Review of the MIP images confirms the above findings CTA HEAD FINDINGS Anterior circulation: Bilateral intracranial ICAs are patent with mild narrowing due to calcific atherosclerosis. Bilateral M1 MCAs and left M2 MCAs are patent. Apparent occlusion of a right M2 MCA branch in a region of calcification. Bilateral ACAs are patent. Posterior circulation: Left dominant intradural vertebral artery. Right intradural vertebral artery makes very small contribution to the basilar artery. Basilar artery and bilateral posterior cerebral arteries are patent without proximal hemodynamically significant stenosis. Venous sinuses: As permitted by contrast timing, patent. Anatomic variants: Detailed above. Review of the MIP images confirms the above findings IMPRESSION: CTA: 1. 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 2. Extensive calcific atherosclerosis at the right carotid bifurcation with approximately 80% stenosis of the proximal ICA. 3. 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 CT head: Small right MCA territory infarcts better characterized on same day MRI. No evidence of progressive mass effect or acute hemorrhage. Findings discussed with Dr. Erlinda Hong via telephone at 12:50 p.m. Electronically Signed   By: Margaretha Sheffield M.D.   On: 12/30/2021 13:00   DG Elbow Complete Left  Result Date: 12/29/2021 CLINICAL DATA:  Fall.  Left elbow pain and bruising. EXAM: LEFT ELBOW - COMPLETE 3+ VIEW COMPARISON:  None Available. FINDINGS: No fracture or dislocation. Narrowed radiocapitellar  joint. Marginal osteophytes project from the base of the radial head. No joint effusion. There is soft tissue edema posteriorly. IMPRESSION: No fracture or dislocation. Electronically Signed   By: Lajean Manes M.D.   On: 12/29/2021 18:12   CT HEAD WO CONTRAST  Result Date: 12/29/2021 CLINICAL DATA:  Neurologic deficit EXAM: CT HEAD WITHOUT CONTRAST TECHNIQUE: Contiguous axial images were obtained from the base of the skull through the vertex without intravenous contrast. RADIATION DOSE REDUCTION: This exam was performed according to the departmental dose-optimization program which includes automated exposure control, adjustment of the mA and/or kV according to patient size and/or use of iterative reconstruction technique. COMPARISON:  None Available. FINDINGS: Brain: Confluent hypodensities throughout the periventricular white matter are most consistent with age-indeterminate small vessel ischemic changes, likely chronic. Focal hypodensities in the left basal ganglia and right frontal periventricular white matter consistent with chronic lacunar infarcts. Age-indeterminate lacunar infarct is seen within the right basal ganglia image 18/3. No other signs of acute infarct or hemorrhage. Lateral ventricles and midline structures are otherwise unremarkable. No acute extra-axial fluid collections. No mass effect. Vascular: No hyperdense vessel or unexpected calcification. Skull: Normal. Negative for fracture or focal lesion. Sinuses/Orbits: No acute finding. Other: None. IMPRESSION: 1. Age indeterminate lacunar infarct within the right basal ganglia. 2. Age-indeterminate small-vessel ischemic changes throughout the periventricular white matter, favor chronic. 3. Chronic left basal  ganglia and right frontal white matter lacunar infarcts as above. 4. No acute hemorrhage. Electronically Signed   By: Randa Ngo M.D.   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 Multiplanar CT image reconstructions were also generated. RADIATION DOSE REDUCTION: This exam was performed according to the departmental dose-optimization program which includes automated exposure control, adjustment of the mA and/or kV according to patient size and/or use of iterative reconstruction technique. COMPARISON:  None Available. FINDINGS: Alignment: Mild degenerative anterolisthesis of C2 and C3. Otherwise alignment is anatomic. Skull base and vertebrae: No acute fracture. No primary bone lesion or focal pathologic process. Soft tissues and spinal canal: No prevertebral fluid or swelling. No visible canal hematoma. Disc levels: Partial bony fusion across the disc spaces at C3-4 and C4-5. There is severe multilevel spondylosis throughout the remainder of the cervical spine, greatest at C5-6 and C6-7. Diffuse facet hypertrophy. Upper chest: Airway is patent. Lung apices are clear. Prominent atherosclerosis of the aortic arch. Other: Reconstructed images demonstrate no additional findings. IMPRESSION: 1. Extensive multilevel cervical degenerative changes. No acute fracture. Electronically Signed   By: Randa Ngo M.D.   On: 12/29/2021 17:38   MR ANGIO HEAD WO CONTRAST  Result Date: 12/30/2021 CLINICAL DATA:  There are deficit with acute stroke suspected EXAM: MRI HEAD WITHOUT CONTRAST MRA HEAD WITHOUT CONTRAST TECHNIQUE: Multiplanar, multi-echo pulse sequences of the brain and surrounding structures were acquired without intravenous contrast. Angiographic images of the Circle of Willis were acquired using MRA technique without intravenous contrast. COMPARISON:  Head CT from yesterday FINDINGS: MRI HEAD FINDINGS Brain: Cluster of small acute infarcts in the right insular cortex, right frontal cortex, and deep right cerebral  white matter. These are in a MCA distribution. Background of advanced chronic small vessel ischemia with confluent gliosis in the hemispheric white matter. Chronic lacunar infarcts in the deep cerebrum. Small remote left cerebral infarct. Gradient signal at the lower right sylvian fissure with there is calcification along the MCA branches by CT. Vascular: Major flow voids are preserved Skull and upper cervical spine: Normal marrow signal Sinuses/Orbits: Bilateral cataract resection MRA HEAD FINDINGS Anterior circulation: High-grade narrowing involving the upper right M2 branch with there is a gradient hypointensity noted above. Elsewhere vessels are smoothly contoured and widely patent. Posterior circulation: Vertebrobasilar arteries are smoothly contoured and widely patent. IMPRESSION: Brain MRI: 1. Cluster of small cortical and white matter infarcts within the right MCA distribution. 2. Background of advanced chronic small vessel ischemia. Intracranial MRA: Focal high-grade narrowing at the right M2 level where there is calcification by CT. This could reflect calcified plaque or a calcified embolus. If an embolus, the proximal nature and degree of mild infarct with suggest additional territory of risk. Electronically Signed   By: Jorje Guild M.D.   On: 12/30/2021 07:53   MR BRAIN WO CONTRAST  Result Date: 12/30/2021 CLINICAL DATA:  There are deficit with acute stroke suspected EXAM: MRI HEAD WITHOUT CONTRAST MRA HEAD WITHOUT CONTRAST TECHNIQUE: Multiplanar, multi-echo pulse sequences of the brain and surrounding structures were acquired without intravenous contrast. Angiographic images of the Circle of Willis were acquired using MRA technique without intravenous contrast. COMPARISON:  Head CT from yesterday FINDINGS: MRI HEAD FINDINGS Brain: Cluster of small acute infarcts in the right insular cortex, right frontal cortex, and  deep right cerebral white matter. These are in a MCA distribution. Background of  advanced chronic small vessel ischemia with confluent gliosis in the hemispheric white matter. Chronic lacunar infarcts in the deep cerebrum. Small remote left cerebral infarct. Gradient signal at the lower right sylvian fissure with there is calcification along the MCA branches by CT. Vascular: Major flow voids are preserved Skull and upper cervical spine: Normal marrow signal Sinuses/Orbits: Bilateral cataract resection MRA HEAD FINDINGS Anterior circulation: High-grade narrowing involving the upper right M2 branch with there is a gradient hypointensity noted above. Elsewhere vessels are smoothly contoured and widely patent. Posterior circulation: Vertebrobasilar arteries are smoothly contoured and widely patent. IMPRESSION: Brain MRI: 1. Cluster of small cortical and white matter infarcts within the right MCA distribution. 2. Background of advanced chronic small vessel ischemia. Intracranial MRA: Focal high-grade narrowing at the right M2 level where there is calcification by CT. This could reflect calcified plaque or a calcified embolus. If an embolus, the proximal nature and degree of mild infarct with suggest additional territory of risk. Electronically Signed   By: Jorje Guild M.D.   On: 12/30/2021 07:53   DG Chest Portable 1 View  Result Date: 12/29/2021 CLINICAL DATA:  CVA. Fell last night. Family concerned patient injured left side EXAM: PORTABLE CHEST 1 VIEW COMPARISON:  Chest two views 03/23/2013 FINDINGS: Cardiac silhouette is mildly enlarged. Moderate calcifications within the aortic arch and descending thoracic aorta. Minimal bibasilar horizontal linear chronic scarring is unchanged. No focal airspace opacity to indicate pneumonia. No pleural effusion or pneumothorax. Moderate multilevel degenerative disc changes of the thoracic spine. IMPRESSION: Mild stable cardiomegaly. No acute lung process. Electronically Signed   By: Yvonne Kendall M.D.   On: 12/29/2021 19:19   DG Hand Complete  Left  Result Date: 12/29/2021 CLINICAL DATA:  Fall.  Bruising to posterior left hand. EXAM: LEFT HAND - COMPLETE 3+ VIEW COMPARISON:  None Available. FINDINGS: There is diffuse decreased bone mineralization. A pulse oximeter overlies the distal index finger obscuring portions. Degenerative changes including joint space narrowing, subchondral sclerosis and peripheral osteophytosis are moderate at the triscaphe joint; moderate to severe at the thumb carpometacarpal, thumb metacarpophalangeal, and thumb interphalangeal joints; and moderate to severe at the DIP joints and moderate at the PIP joints of the second through fifth fingers. No acute fracture is seen. No dislocation. IMPRESSION: Osteoarthritis as above. No acute fracture. Electronically Signed   By: Yvonne Kendall M.D.   On: 12/29/2021 18:12   ECHOCARDIOGRAM COMPLETE  Result Date: 12/30/2021    ECHOCARDIOGRAM REPORT   Patient Name:   RAYQUON USELMAN Date of Exam: 12/30/2021 Medical Rec #:  [id number]         Height:       66.0 in Accession #:    [id number]        Weight:       133.6 lb Date of Birth:  1932/04/12          BSA:          1.685 m Patient Age:    38 years          BP:           161/84 mmHg Patient Gender: M                 HR:           66 bpm. Exam Location:  Inpatient Procedure: 2D Echo Indications:    stroke  History:  Patient has prior history of Echocardiogram examinations, most                 recent 04/25/2020. Chronic kidney disease.; Risk                 Factors:Hypertension and Dyslipidemia.  Sonographer:    Johny Chess RDCS Referring Phys: [place]  1. Left ventricular ejection fraction, by estimation, is 55 to 60%. The left ventricle has normal function. The left ventricle has no regional wall motion abnormalities. There is mild left ventricular hypertrophy. Left ventricular diastolic parameters are consistent with Grade I diastolic dysfunction (impaired relaxation).  2. Right ventricular  systolic function is normal. The right ventricular size is normal.  3. Left atrial size was mildly dilated.  4. The mitral valve is normal in structure. Mild mitral valve regurgitation. No evidence of mitral stenosis.  5. The aortic valve is tricuspid. Aortic valve regurgitation is mild. Aortic valve sclerosis/calcification is present, without any evidence of aortic stenosis.  6. The inferior vena cava is normal in size with greater than 50% respiratory variability, suggesting right atrial pressure of 3 mmHg. FINDINGS  Left Ventricle: Left ventricular ejection fraction, by estimation, is 55 to 60%. The left ventricle has normal function. The left ventricle has no regional wall motion abnormalities. The left ventricular internal cavity size was normal in size. There is  mild left ventricular hypertrophy. Left ventricular diastolic parameters are consistent with Grade I diastolic dysfunction (impaired relaxation). Right Ventricle: The right ventricular size is normal. Right ventricular systolic function is normal. Left Atrium: Left atrial size was mildly dilated. Right Atrium: Right atrial size was normal in size. Pericardium: Trivial pericardial effusion is present. Mitral Valve: The mitral valve is normal in structure. Mild mitral annular calcification. Mild mitral valve regurgitation. No evidence of mitral valve stenosis. Tricuspid Valve: The tricuspid valve is normal in structure. Tricuspid valve regurgitation is trivial. No evidence of tricuspid stenosis. Aortic Valve: The aortic valve is tricuspid. Aortic valve regurgitation is mild. Aortic regurgitation PHT measures 400 msec. Aortic valve sclerosis/calcification is present, without any evidence of aortic stenosis. Pulmonic Valve: The pulmonic valve was normal in structure. Pulmonic valve regurgitation is trivial. No evidence of pulmonic stenosis. Aorta: The aortic root is normal in size and structure. Venous: The inferior vena cava is normal in size with  greater than 50% respiratory variability, suggesting right atrial pressure of 3 mmHg. IAS/Shunts: No atrial level shunt detected by color flow Doppler.  LEFT VENTRICLE PLAX 2D LVIDd:         5.20 cm   Diastology LVIDs:         3.10 cm   LV e' medial:    4.50 cm/s LV PW:         1.20 cm   LV E/e' medial:  12.0 LV IVS:        1.10 cm   LV e' lateral:   4.20 cm/s LVOT diam:     2.10 cm   LV E/e' lateral: 12.9 LV SV:         80 LV SV Index:   47 LVOT Area:     3.46 cm  RIGHT VENTRICLE             IVC RV S prime:     15.70 cm/s  IVC diam: 1.00 cm TAPSE (M-mode): 2.0 cm LEFT ATRIUM             Index        RIGHT  ATRIUM           Index LA diam:        3.40 cm 2.02 cm/m   RA Area:     16.30 cm LA Vol (A2C):   79.2 ml 47.01 ml/m  RA Volume:   39.80 ml  23.62 ml/m LA Vol (A4C):   47.5 ml 28.20 ml/m LA Biplane Vol: 63.0 ml 37.40 ml/m  AORTIC VALVE LVOT Vmax:   102.00 cm/s LVOT Vmean:  67.600 cm/s LVOT VTI:    0.230 m AI PHT:      400 msec  AORTA Ao Root diam: 3.70 cm Ao Asc diam:  3.10 cm MITRAL VALVE MV Area (PHT): 4.60 cm    SHUNTS MV Decel Time: 165 msec    Systemic VTI:  0.23 m MR Peak grad: 134.6 mmHg   Systemic Diam: 2.10 cm MR Mean grad: 82.0 mmHg MR Vmax:      580.00 cm/s MR Vmean:     416.0 cm/s MV E velocity: 54.00 cm/s MV A velocity: 96.40 cm/s MV E/A ratio:  0.56 Kirk Ruths MD Electronically signed by Kirk Ruths MD Signature Date/Time: 12/30/2021/2:40:23 PM    Final    VAS US CAROTID  Result Date: 12/31/2021 Carotid Arterial Duplex Study Patient Name:  MARKOS THEIL  Date of Exam:   12/30/2021 Medical Rec #: [id number]          Accession #:    [id number] Date of Birth: 24-Jun-1932           Patient Gender: M Patient Age:   56 years Exam Location:  [facility] [place] Procedure:      VAS US CAROTID Referring Phys: Nyoka Lint DOUTOVA --------------------------------------------------------------------------------  Indications:       CVA. Risk Factors:      Hypertension. Comparison Study:  No prior  study Performing Technologist: Maudry Mayhew MHA, RDMS, RVT, RDCS  Examination Guidelines: A complete evaluation includes B-mode imaging, spectral Doppler, color Doppler, and power Doppler as needed of all accessible portions of each vessel. Bilateral testing is considered an integral part of a complete examination. Limited examinations for reoccurring indications may be performed as noted.  Right Carotid Findings: +----------+-------+--------+--------+-------------------------------+---------+           PSV    EDV cm/sStenosisPlaque Description             Comments            cm/s                                                            +----------+-------+--------+--------+-------------------------------+---------+ CCA Prox  54     7               heterogenous and irregular               +----------+-------+--------+--------+-------------------------------+---------+ CCA Distal58     9               smooth and heterogenous                  +----------+-------+--------+--------+-------------------------------+---------+ ICA Prox  132    19              smooth, heterogenous and       Shadowing  calcific                                 +----------+-------+--------+--------+-------------------------------+---------+ ICA Distal78     15                                                       +----------+-------+--------+--------+-------------------------------+---------+ ECA       126                                                             +----------+-------+--------+--------+-------------------------------+---------+ +----------+--------+-------+----------------+-------------------+           PSV cm/sEDV cmsDescribe        Arm Pressure (mmHG) +----------+--------+-------+----------------+-------------------+ [id number]            Multiphasic, WNL                     +----------+--------+-------+----------------+-------------------+ +---------+--------+--+--------+-+---------+ VertebralPSV cm/s51EDV cm/s5Antegrade +---------+--------+--+--------+-+---------+  Left Carotid Findings: +----------+--------+--------+--------+------------------------------+---------+           PSV cm/sEDV cm/sStenosisPlaque Description            Comments  +----------+--------+--------+--------+------------------------------+---------+ CCA Prox  89                      heterogenous and irregular    tortuous  +----------+--------+--------+--------+------------------------------+---------+ CCA Distal70      8                                                       +----------+--------+--------+--------+------------------------------+---------+ ICA Prox  49      12              heterogenous, irregular and   Shadowing                                   calcific                                +----------+--------+--------+--------+------------------------------+---------+ ICA Distal98      18                                                      +----------+--------+--------+--------+------------------------------+---------+ ECA       67      10                                                      +----------+--------+--------+--------+------------------------------+---------+ +----------+--------+--------+----------------+-------------------+           PSV cm/sEDV cm/sDescribe  Arm Pressure (mmHG) +----------+--------+--------+----------------+-------------------+ [id number]             Multiphasic, WNL                    +----------+--------+--------+----------------+-------------------+ +---------+--------+--+--------+--+---------+ VertebralPSV cm/s94EDV cm/s14Antegrade +---------+--------+--+--------+--+---------+   Summary: Right Carotid: Velocities in the right ICA are consistent with a 1-39% stenosis. Left Carotid:  Velocities in the left ICA are consistent with a 1-39% stenosis. Vertebrals:  Bilateral vertebral arteries demonstrate antegrade flow. Subclavians: Normal flow hemodynamics were seen in bilateral subclavian              arteries. *See table(s) above for measurements and observations.  Electronically signed by Antony Contras MD on 12/31/2021 at 3:59:42 PM.    Final      PHYSICAL EXAM  Temp:  [98 F (36.7 C)-100.8 F (38.2 C)] 98 F (36.7 C) (05/31 1405) Pulse Rate:  [77-104] 99 (05/31 1530) Resp:  [12-23] 22 (05/31 1530) BP: (117-224)/(62-193) 131/79 (05/31 1530) SpO2:  [78 %-100 %] 81 % (05/31 1530) Arterial Line BP: (133-178)/(48-71) 149/58 (05/31 1530) Weight:  [59 kg] 59 kg (05/31 0805)  General - Well nourished, well developed, in no apparent distress.  Ophthalmologic - fundi not visualized due to noncooperation.  Cardiovascular - Regular rhythm and rate.  Neuro - awake, alert, eyes open, hard of hearing, orientated to age, place, time and people. No aphasia, paucity of speech, moderate dysarthria, following all simple commands. Able to name and repeat with dysarthric voice. No gaze palsy, tracking bilaterally, visual field full but with possible left simultagnosia, PERRL. Left facial droop. Tongue midline. RUE at least 4/5 and LUE 2-/5 bicep, otherwise 0/5. RLE 4/5, LLE proximal not able to check due to femoral access but 2/5 distal DF. Sensation not cooperative, right FTN intact grossly, gait not tested.    ASSESSMENT/PLAN Mr. GOPAL MALTER is a 88 y.o. male with history of hypertension, hyperlipidemia, hearing loss admitted for left-sided weakness and fell at home. No tPA given due to outside window.    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 MRA right M 2 high-grade  stenosis MRI right MCA scattered small infarcts CT head and neck proximal right M2 occlusion due to calcified embolus.  Extensive calcified atherosclerosis at right ICA bifurcation with 80% stenosis.  Severe stenosis nondominant right VA origin.  Carotid Doppler unremarkable 2D Echo EF 55 to 60% LDL 90 HgbA1c 6.1 Lovenox for VTE prophylaxis No antithrombotic prior to admission, now on aspirin 325 mg daily and clopidogrel 75 mg daily DAPT.  Patient counseled to be compliant with his antithrombotic medications Ongoing aggressive stroke risk factor management Therapy recommendations:  CIR Disposition:  pending  Carotid stenosis CT head and neck extensive calcified atherosclerosis at right ICA bifurcation with 80% stenosis. Likely the source for right M2 occlusion and right MCA stroke Dr. Estanislado Pandy on board Unsuccessful angio 5/30 due to moving too much during procedure S/p right carotid angioplasty 5/31 On DAPT Follow up with Dr. Estanislado Pandy in 1-2 months, if restenosis, may need to consider right CEA  Hypertension Stable BP goal 120-140 post carotid angioplasty  Long term BP goal normotensive  Hyperlipidemia Home meds: Lovastatin 40 LDL 90, goal < 70 Now on Crestor 20 Continue statin at discharge  Other Stroke Risk Factors Advanced age  Other Active Problems  Hard of hearing Independent lives alone before admission  Hospital day # 3   Rosalin Hawking, MD PhD Stroke Neurology 01/01/2022 3:56 PM    To contact Stroke Continuity provider, please refer to [email or web address] After hours, contact General Neurology

## 2022-01-01 NOTE — Sedation Documentation (Addendum)
Pressure released at 1240 and dressing applied to left groin site.  Quick clot, guaze, and tegaderm applied.

## 2022-01-01 NOTE — Procedures (Addendum)
INR Right common carotid arteriogram. Left CFA approach . Findings.   Severe high-grade stenosis of the right internal carotid artery proximally secondary to complex atherosclerotic plaque. Procedure. Status post balloon angioplasty of severe high-grade symptomatic stenosis of the proximal right ICA to approximately a 60% patency. Post CT of the brain demonstrates no hemorrhagic complications.  Manual compression with quick clot for hemostasis at the left common femoral artery puncture site.  Both dorsalis pedis pulses palpable.  Patient extubated.  Moving right side spontaneously.  No movement in the left upper extremity hour the left lower extremity yet.  Pupils 2 to 3 mm bilaterally sluggish and equal. S.Selmer Adduci MD

## 2022-01-01 NOTE — Anesthesia Preprocedure Evaluation (Addendum)
Anesthesia Evaluation  Patient identified by MRN, date of birth, ID band Patient awake    Reviewed: Allergy & Precautions, NPO status , Patient's Chart, lab work & pertinent test results  Airway Mallampati: II  TM Distance: >3 FB Neck ROM: Full    Dental  (+) Dental Advisory Given   Pulmonary neg pulmonary ROS,    breath sounds clear to auscultation       Cardiovascular hypertension, Pt. on medications and Pt. on home beta blockers  Rhythm:Regular Rate:Normal     Neuro/Psych CVA    GI/Hepatic negative GI ROS, Neg liver ROS,   Endo/Other  negative endocrine ROS  Renal/GU CRFRenal disease     Musculoskeletal  (+) Arthritis ,   Abdominal   Peds  Hematology  (+) Blood dyscrasia, anemia ,   Anesthesia Other Findings   Reproductive/Obstetrics                             Anesthesia Physical Anesthesia Plan  ASA: 3  Anesthesia Plan: General   Post-op Pain Management: Minimal or no pain anticipated   Induction: Intravenous  PONV Risk Score and Plan: 2 and Dexamethasone, Ondansetron and Treatment may vary due to age or medical condition  Airway Management Planned: Oral ETT  Additional Equipment: Arterial line  Intra-op Plan:   Post-operative Plan: Extubation in OR  Informed Consent: I have reviewed the patients History and Physical, chart, labs and discussed the procedure including the risks, benefits and alternatives for the proposed anesthesia with the patient or authorized representative who has indicated his/her understanding and acceptance.     Dental advisory given  Plan Discussed with: CRNA  Anesthesia Plan Comments:        Anesthesia Quick Evaluation

## 2022-01-01 NOTE — Transfer of Care (Signed)
Immediate Anesthesia Transfer of Care Note  Patient: Chad [name]  Procedure(s) Performed: Angiogram  Patient Location: PACU  Anesthesia Type:General  Level of Consciousness: drowsy, patient cooperative and responds to stimulation  Airway & Oxygen Therapy: Patient Spontanous Breathing  Post-op Assessment: Report given to RN and Post -op Vital signs reviewed and stable  Post vital signs: Reviewed and stable  Last Vitals:  Vitals Value Taken Time  BP 125/65 01/01/22 1304  Temp    Pulse 97 01/01/22 1306  Resp 17 01/01/22 1306  SpO2 99 % 01/01/22 1306  Vitals shown include unvalidated device data.  Last Pain:  Vitals:   01/01/22 0814  TempSrc:   PainSc: 0-No pain         Complications: No notable events documented.

## 2022-01-01 NOTE — Progress Notes (Signed)
PROGRESS NOTE  Chad [name]  [id number] DOB: March 06, 1932 DOA: 12/29/2021 PCP: [name], MD   Brief Narrative: Patient is a 88 year old male with history of hypothyroidism, chronic pain syndrome, CKD stage IIIa hypertension, arthritis who presented with a fall from home.  Patient lives alone.  Patient was also found to have left-sided weakness, left-sided facial droop, slurred speech.  CT head showed age-indeterminate lacunar infarct within the right basal ganglia.  Stroke suspected and was admitted.  Neurology consulted.  MRI showed right MCA distribution infarcts,.  Stroke work-up initiated.  Imagings also showed occlusion of a proximal right M2 MCA branch .  Cerebral angiogram showed severe high-grade stenosis of right internal carotid artery status post balloon angioplasty.  PT/OT recommending CIR on discharge.   Assessment & Plan:  Principal Problem:   Stroke (cerebrum) (HCC) Active Problems:   CKD (chronic kidney disease) stage 3, GFR 30-59 ml/min (HCC)   Anemia   Hyperlipidemia   Essential hypertension   Internal carotid artery stenosis, right  Acute ischemic stroke: Presented with fall.  Found to have left-sided weakness.  Neurology following.   MRI showed cluster of small cortical and white matter infarcts within the right MCA distribution.Intracranial MRA showed focal high-grade narrowing at the right M2 level .  CT angio head and neck also showed 80% stenosis of the proximal ICA,severe stenosis of the non dominant right vertebral artery origin. IR consulted,cerebral angiogram showed severe high-grade stenosis of right internal carotid artery status post balloon angioplasty.  Echo showed EF of 55 to 35%, grade 1 diastolic dysfunction no atrial level shunt or any intracardiac source of emboli.   Hemoglobin A1c 6.1.  LDL 90.  Started on a statin, aspirin, Brilinta. Currently he is alert oriented.  At baseline, he lives alone, can walk, cooks his own food.  Hypertension:  Takes amlodipine, atenolol at home.  Resumed  CKD stage IIIa: Currently kidney.  Baseline.  Hyperlipidemia: Continue crestor  Arthritis: Continue supportive care.            DVT prophylaxis:enoxaparin (LOVENOX) injection 30 mg Start: 12/30/21 2000 SCD's Start: 12/29/21 2003     Code Status: Full Code  Family Communication: Daughter at bedside  Patient status:Inpatient  Patient is from :Home  Anticipated discharge to:CIR  Estimated DC date:after full stroke work-up   Consultants: Neurology  Procedures:None yet  Antimicrobials:  Anti-infectives (From admission, onward)    Start     Dose/Rate Route Frequency Ordered Stop   12/31/21 1800  [MAR Hold]  ceFAZolin (ANCEF) IVPB 2g/100 mL premix        (MAR Hold since Wed 01/01/2022 at [place].Hold Reason: Transfer to a Procedural area)   2 g 200 mL/hr over 30 Minutes Intravenous To Radiology 12/31/21 1712 01/01/22 0934       Subjective: Patient seen and examined at [facility] after cerebral angiogram.hemodynamically stable. Slighlty drowsy/sleepy from anesthesia effect. Looks comfortable  Objective: Vitals:   12/31/21 2349 01/01/22 0020 01/01/22 0417 01/01/22 0805  BP: (!) 165/73 (!) 168/74 (!) 149/83 (!) 172/89  Pulse: 87 77 95 93  Resp:  18 18 18   Temp: 98.4 F (36.9 C) 98.4 F (36.9 C) 98.1 F (36.7 C) 98.9 F (37.2 C)  TempSrc: Oral Oral Oral Oral  SpO2: 100% 100% 97% 98%  Weight:    59 kg  Height:    5\' 1"  (1.549 m)    Intake/Output Summary (Last 24 hours) at 01/01/2022 1322 Last data filed at 01/01/2022 1307 Gross per 24 hour  Intake 1813.75  ml  Output 500 ml  Net 1313.75 ml   Filed Weights   01/01/22 0805  Weight: 59 kg    Examination:  General exam: Lying on bed.drowsy Respiratory system:  no wheezes or crackles  Cardiovascular system: S1 & S2 heard, RRR.  Gastrointestinal system: Abdomen is nondistended, soft and nontender. Central nervous system: sleepy/drowsy.left hemiplegia,left facial  droop Extremities: No edema, no clubbing ,no cyanosis Skin: No rashes, no ulcers,no icterus     Data Reviewed: I have personally reviewed following labs and imaging studies  CBC: Recent Labs  Lab 12/29/21 1700 12/29/21 1722 12/31/21 0357  WBC 6.9  --  6.4  NEUTROABS 3.8  --   --   HGB 11.6* 11.6* 10.0*  HCT 35.6* 34.0* 30.7*  MCV 86.2  --  84.8  PLT 197  --  371   Basic Metabolic Panel: Recent Labs  Lab 12/29/21 1700 12/29/21 1722 12/31/21 0357  NA 140 141 145  K 4.4 4.4 3.8  CL 108 109 114*  CO2 22  --  22  GLUCOSE 119* 118* 110*  BUN 35* 36* 27*  CREATININE 1.46* 1.40* 1.33*  CALCIUM 9.9  --  8.9  MG 1.9  --   --   PHOS 3.5  --   --      No results found for this or any previous visit (from the past 240 hour(s)).   Radiology Studies: ECHOCARDIOGRAM COMPLETE  Result Date: 12/30/2021    ECHOCARDIOGRAM REPORT   Patient Name:   Chad [name] Date of Exam: 12/30/2021 Medical Rec #:  [id number]         Height:       66.0 in Accession #:    [id number]        Weight:       133.6 lb Date of Birth:  [birth year]          BSA:          1.685 m Patient Age:    89 years          BP:           161/84 mmHg Patient Gender: M                 HR:           66 bpm. Exam Location:  Inpatient Procedure: 2D Echo Indications:    stroke  History:        Patient has prior history of Echocardiogram examinations, most                 recent 04/25/2020. Chronic kidney disease.; Risk                 Factors:Hypertension and Dyslipidemia.  Sonographer:    Johny Chess RDCS Referring Phys: [place]  1. Left ventricular ejection fraction, by estimation, is 55 to 60%. The left ventricle has normal function. The left ventricle has no regional wall motion abnormalities. There is mild left ventricular hypertrophy. Left ventricular diastolic parameters are consistent with Grade I diastolic dysfunction (impaired relaxation).  2. Right ventricular systolic function is normal. The  right ventricular size is normal.  3. Left atrial size was mildly dilated.  4. The mitral valve is normal in structure. Mild mitral valve regurgitation. No evidence of mitral stenosis.  5. The aortic valve is tricuspid. Aortic valve regurgitation is mild. Aortic valve sclerosis/calcification is present, without any evidence of aortic stenosis.  6. The inferior vena cava is normal in  size with greater than 50% respiratory variability, suggesting right atrial pressure of 3 mmHg. FINDINGS  Left Ventricle: Left ventricular ejection fraction, by estimation, is 55 to 60%. The left ventricle has normal function. The left ventricle has no regional wall motion abnormalities. The left ventricular internal cavity size was normal in size. There is  mild left ventricular hypertrophy. Left ventricular diastolic parameters are consistent with Grade I diastolic dysfunction (impaired relaxation). Right Ventricle: The right ventricular size is normal. Right ventricular systolic function is normal. Left Atrium: Left atrial size was mildly dilated. Right Atrium: Right atrial size was normal in size. Pericardium: Trivial pericardial effusion is present. Mitral Valve: The mitral valve is normal in structure. Mild mitral annular calcification. Mild mitral valve regurgitation. No evidence of mitral valve stenosis. Tricuspid Valve: The tricuspid valve is normal in structure. Tricuspid valve regurgitation is trivial. No evidence of tricuspid stenosis. Aortic Valve: The aortic valve is tricuspid. Aortic valve regurgitation is mild. Aortic regurgitation PHT measures 400 msec. Aortic valve sclerosis/calcification is present, without any evidence of aortic stenosis. Pulmonic Valve: The pulmonic valve was normal in structure. Pulmonic valve regurgitation is trivial. No evidence of pulmonic stenosis. Aorta: The aortic root is normal in size and structure. Venous: The inferior vena cava is normal in size with greater than 50% respiratory  variability, suggesting right atrial pressure of 3 mmHg. IAS/Shunts: No atrial level shunt detected by color flow Doppler.  LEFT VENTRICLE PLAX 2D LVIDd:         5.20 cm   Diastology LVIDs:         3.10 cm   LV e' medial:    4.50 cm/s LV PW:         1.20 cm   LV E/e' medial:  12.0 LV IVS:        1.10 cm   LV e' lateral:   4.20 cm/s LVOT diam:     2.10 cm   LV E/e' lateral: 12.9 LV SV:         80 LV SV Index:   47 LVOT Area:     3.46 cm  RIGHT VENTRICLE             IVC RV S prime:     15.70 cm/s  IVC diam: 1.00 cm TAPSE (M-mode): 2.0 cm LEFT ATRIUM             Index        RIGHT ATRIUM           Index LA diam:        3.40 cm 2.02 cm/m   RA Area:     16.30 cm LA Vol (A2C):   79.2 ml 47.01 ml/m  RA Volume:   39.80 ml  23.62 ml/m LA Vol (A4C):   47.5 ml 28.20 ml/m LA Biplane Vol: 63.0 ml 37.40 ml/m  AORTIC VALVE LVOT Vmax:   102.00 cm/s LVOT Vmean:  67.600 cm/s LVOT VTI:    0.230 m AI PHT:      400 msec  AORTA Ao Root diam: 3.70 cm Ao Asc diam:  3.10 cm MITRAL VALVE MV Area (PHT): 4.60 cm    SHUNTS MV Decel Time: 165 msec    Systemic VTI:  0.23 m MR Peak grad: 134.6 mmHg   Systemic Diam: 2.10 cm MR Mean grad: 82.0 mmHg MR Vmax:      580.00 cm/s MR Vmean:     416.0 cm/s MV E velocity: 54.00 cm/s MV A velocity: 96.40 cm/s MV E/A  ratio:  0.56 Kirk Ruths MD Electronically signed by Kirk Ruths MD Signature Date/Time: 12/30/2021/2:40:23 PM    Final     Scheduled Meds:  [MAR Hold] aspirin EC  81 mg Oral Daily   [MAR Hold] enoxaparin (LOVENOX) injection  30 mg Subcutaneous Q24H   nitroGLYCERIN       [MAR Hold] rosuvastatin  20 mg Oral Daily   [MAR Hold] tamsulosin  0.4 mg Oral Daily   [MAR Hold] ticagrelor  90 mg Oral BID   Continuous Infusions:  sodium chloride 75 mL/hr at 12/31/21 2014   sodium chloride 75 mL/hr at 12/31/21 2019   heparin     heparin       LOS: 3 days   Shelly Coss, MD [facility] P5/31/2023, 1:22 PM

## 2022-01-01 NOTE — Sedation Documentation (Signed)
Left femoral sheath pulled at 1204 and pressure being held.

## 2022-01-01 NOTE — H&P (Signed)
HPI:  The patient has had a H&P performed within the last 30 days, all history, medications, and exam have been reviewed. The patient denies any interval changes since the H&P.  Medications: Prior to Admission medications   Medication Sig Start Date End Date Taking? Authorizing Provider  amLODipine (NORVASC) 5 MG tablet Take 5 mg by mouth daily. 11/21/21  Yes [provider]  atenolol (TENORMIN) 100 MG tablet Take 100 mg by mouth every morning.   Yes [provider]  HYDROcodone-acetaminophen (NORCO) 7.5-325 MG tablet Take 1 tablet by mouth 3 (three) times daily as needed for moderate pain. 11/25/21  Yes [provider]  lovastatin (MEVACOR) 40 MG tablet Take 40 mg by mouth at bedtime.   Yes [provider]  tamsulosin (FLOMAX) 0.4 MG CAPS capsule Take 0.4 mg by mouth daily.   Yes [provider]  docusate sodium (COLACE) 100 MG capsule Take 1 capsule (100 mg total) by mouth 2 (two) times daily. Patient not taking: Reported on 12/29/2021 10/10/20   Irving Copas, PA-C  ferrous sulfate 325 (65 FE) MG tablet Take 1 tablet (325 mg total) by mouth 2 (two) times daily with a meal for 14 days. 10/10/20 10/24/20  Irving Copas, PA-C  HYDROcodone-acetaminophen (NORCO/VICODIN) 5-325 MG tablet Take 1-2 tablets by mouth every 6 (six) hours as needed for moderate pain (pain score 4-6). Patient not taking: Reported on 12/29/2021 10/10/20   Irving Copas, PA-C  methocarbamol (ROBAXIN) 500 MG tablet Take 1 tablet (500 mg total) by mouth every 6 (six) hours as needed for muscle spasms. Patient not taking: Reported on 12/29/2021 10/10/20   Irving Copas, PA-C  polyethylene glycol (MIRALAX / GLYCOLAX) 17 g packet Take 17 g by mouth 2 (two) times daily. Patient not taking: Reported on 12/29/2021 10/10/20   Irving Copas, PA-C     Vital Signs: BP (!) 149/83 (BP Location: Right Arm)   Pulse 95   Temp 98.1 F (36.7 C) (Oral)   Resp 18   SpO2 97%   Physical  Exam Vitals and nursing note reviewed.  Constitutional:      General: He is not in acute distress. HENT:     Head: Normocephalic.  Cardiovascular:     Rate and Rhythm: Normal rate and regular rhythm.  Pulmonary:     Effort: Pulmonary effort is normal.     Breath sounds: Normal breath sounds.  Abdominal:     Palpations: Abdomen is soft.  Skin:    General: Skin is warm and dry.  Neurological:     Mental Status: He is alert. Mental status is at baseline. He is disoriented.    Mallampati Score:  MD Evaluation Airway: WNL Heart: WNL Abdomen: WNL Chest/ Lungs: WNL ASA  Classification: 3 Mallampati/Airway Score: Two  Labs:  CBC: Recent Labs    12/29/21 1700 12/29/21 1722 12/31/21 0357  WBC 6.9  --  6.4  HGB 11.6* 11.6* 10.0*  HCT 35.6* 34.0* 30.7*  PLT 197  --  170    COAGS: Recent Labs    12/29/21 1700  INR 1.4*  APTT 38*    BMP: Recent Labs    12/29/21 1700 12/29/21 1722 12/31/21 0357  NA 140 141 145  K 4.4 4.4 3.8  CL 108 109 114*  CO2 22  --  22  GLUCOSE 119* 118* 110*  BUN 35* 36* 27*  CALCIUM 9.9  --  8.9  CREATININE 1.46* 1.40* 1.33*  GFRNONAA 46*  --  51*    LIVER FUNCTION TESTS: Recent Labs    12/29/21 1700  BILITOT 0.8  AST 25  ALT 12  ALKPHOS 56  PROT 7.6  ALBUMIN 3.4*    Assessment/Plan:   88 y/o M with right ICA occlusion who underwent attempted diagnostic cerebral angiogram yesterday with moderate sedation however procedure was aborted due to patient's inability to lay still causing patient safety concerns, he is planned for cerebral angiogram with general anesthesia today in NIR.  Patient and daughter remain in agreement with plan. NIR plans to proceed today at 8:30 am.  Patient scheduled to receive ASA 81 mg + Brilinta 90 mg at 0800 today. Lovenox 30 mg held last night.  Signed: Joaquim Nam 01/01/2022, 7:31 AM

## 2022-01-01 NOTE — Anesthesia Procedure Notes (Signed)
Arterial Line Insertion Start/End5/31/2023 8:45 AM, 01/01/2022 9:00 AM Performed by: Rande Brunt, CRNA  Patient location: Pre-op. Preanesthetic checklist: patient identified, IV checked, site marked, risks and benefits discussed, surgical consent, monitors and equipment checked, pre-op evaluation, timeout performed and anesthesia consent Lidocaine 1% used for infiltration Left, radial was placed Catheter size: 20 G Hand hygiene performed  and maximum sterile barriers used   Attempts: 2 Procedure performed without using ultrasound guided technique. Following insertion, dressing applied and Biopatch. Post procedure assessment: normal and unchanged

## 2022-01-01 NOTE — Progress Notes (Signed)
Pt seen post procedure by myself and Dr. Estanislado Pandy. Family at bedside. Pt awake and responsive to some commands. More responsive to daughter's voice/commands. Tending to gaze right but will move eyes left across midline but not all the way. Still no movement left UE Per daughter and RN, some movement of (L)LE but not during this exam. Heparin gtt running.  Ascencion Dike PA-C Interventional Radiology 01/01/2022 4:17 PM

## 2022-01-01 NOTE — Progress Notes (Signed)
Inpatient Rehab Admissions Coordinator:   Pt for angio and possible stenting today.  Insurance auth for [employer] pending.  Will follow.   Shann Medal, PT, DPT Admissions Coordinator [phone] 01/01/22  11:21 AM

## 2022-01-01 NOTE — Progress Notes (Signed)
[facility] for heparin Indication:  Post Interventional Neuroradiology Procedure  Heparin Dosing Weight: 59 kg  Labs: Recent Labs    12/29/21 1700 12/29/21 1722 12/30/21 0412 12/31/21 0357  HGB 11.6* 11.6*  --  10.0*  HCT 35.6* 34.0*  --  30.7*  PLT 197  --   --  170  APTT 38*  --   --   --   LABPROT 16.7*  --   --   --   INR 1.4*  --   --   --   CREATININE 1.46* 1.40*  --  1.33*  CKTOTAL 328  --  227  --     Assessment: 18 yom presenting with acute ischemic stroke, initial IR intervention aborted 5/30 due to inability to lie still. Re-attempt successful on 5/31. Pharmacy consulted to dose heparin per post-interventional neuroradiology procedure protocol - heparin already started at 500 units/hr in PACU. Patient is not on anticoagulation PTA. Previously on Lovenox prophylactic dosing inpatient. Hg down a bit to 10, plt WNL (last 5/30). SCr stable 1.33. No bleed issues reported.  Goal of Therapy:  Heparin level 0.1-0.25 units/ml Monitor platelets by anticoagulation protocol: Yes   Plan:  No bolus. Continue heparin at 500 units/hr Check 8hr heparin level Monitor CBC, s/sx bleeding Heparin off at 0800 on 6/1 per protocol - MD already ordered Lovenox prophylaxis to resume tomorrow   Arturo Morton, PharmD, BCPS Clinical Pharmacist 01/01/2022 2:26 PM

## 2022-01-01 NOTE — Anesthesia Procedure Notes (Signed)
Procedure Name: Intubation Date/Time: 01/01/2022 9:27 AM Performed by: Rande Brunt, CRNA Pre-anesthesia Checklist: Patient identified, Emergency Drugs available, Suction available and Patient being monitored Patient Re-evaluated:Patient Re-evaluated prior to induction Oxygen Delivery Method: Circle System Utilized Preoxygenation: Pre-oxygenation with 100% oxygen Induction Type: IV induction Ventilation: Mask ventilation without difficulty Laryngoscope Size: Mac and 4 Grade View: Grade I Tube type: Oral Tube size: 7.5 mm Number of attempts: 1 Airway Equipment and Method: Stylet Placement Confirmation: ETT inserted through vocal cords under direct vision, positive ETCO2 and breath sounds checked- equal and bilateral Secured at: 23 cm Tube secured with: Tape Dental Injury: Teeth and Oropharynx as per pre-operative assessment

## 2022-01-01 NOTE — Progress Notes (Signed)
Inpatient Rehab Admissions Coordinator:   We did receive insurance approval for CIR.  I will follow for potential admit in the next day or so pending clearance from medical team.   Shann Medal, PT, DPT Admissions Coordinator [phone] 01/01/22  2:25 PM

## 2022-01-02 ENCOUNTER — Other Ambulatory Visit: Payer: Self-pay

## 2022-01-02 ENCOUNTER — Inpatient Hospital Stay (HOSPITAL_COMMUNITY): Payer: Medicare Other

## 2022-01-02 ENCOUNTER — Encounter (HOSPITAL_COMMUNITY): Payer: Self-pay | Admitting: Interventional Radiology

## 2022-01-02 ENCOUNTER — Inpatient Hospital Stay (HOSPITAL_COMMUNITY)
Admission: RE | Admit: 2022-01-02 | Discharge: 2022-01-15 | DRG: 056 | Disposition: A | Discharge status: Other Acute Inpatient Hospital | Payer: Medicare Other | Source: Intra-hospital | Attending: Physical Medicine & Rehabilitation | Admitting: Physical Medicine & Rehabilitation

## 2022-01-02 DIAGNOSIS — A419 Sepsis, unspecified organism: Secondary | ICD-10-CM | POA: Diagnosis not present

## 2022-01-02 DIAGNOSIS — Z9889 Other specified postprocedural states: Secondary | ICD-10-CM | POA: Diagnosis not present

## 2022-01-02 DIAGNOSIS — R569 Unspecified convulsions: Secondary | ICD-10-CM | POA: Diagnosis not present

## 2022-01-02 DIAGNOSIS — M47816 Spondylosis without myelopathy or radiculopathy, lumbar region: Secondary | ICD-10-CM | POA: Diagnosis not present

## 2022-01-02 DIAGNOSIS — J9601 Acute respiratory failure with hypoxia: Secondary | ICD-10-CM | POA: Diagnosis not present

## 2022-01-02 DIAGNOSIS — K5900 Constipation, unspecified: Secondary | ICD-10-CM | POA: Diagnosis present

## 2022-01-02 DIAGNOSIS — Z515 Encounter for palliative care: Secondary | ICD-10-CM | POA: Diagnosis not present

## 2022-01-02 DIAGNOSIS — N400 Enlarged prostate without lower urinary tract symptoms: Secondary | ICD-10-CM | POA: Diagnosis present

## 2022-01-02 DIAGNOSIS — E876 Hypokalemia: Secondary | ICD-10-CM | POA: Diagnosis not present

## 2022-01-02 DIAGNOSIS — N183 Chronic kidney disease, stage 3 unspecified: Secondary | ICD-10-CM | POA: Diagnosis not present

## 2022-01-02 DIAGNOSIS — R000 Tachycardia, unspecified: Secondary | ICD-10-CM | POA: Diagnosis not present

## 2022-01-02 DIAGNOSIS — Z66 Do not resuscitate: Secondary | ICD-10-CM | POA: Diagnosis not present

## 2022-01-02 DIAGNOSIS — R0682 Tachypnea, not elsewhere classified: Secondary | ICD-10-CM | POA: Diagnosis not present

## 2022-01-02 DIAGNOSIS — R1312 Dysphagia, oropharyngeal phase: Secondary | ICD-10-CM | POA: Diagnosis present

## 2022-01-02 DIAGNOSIS — E1122 Type 2 diabetes mellitus with diabetic chronic kidney disease: Secondary | ICD-10-CM | POA: Diagnosis not present

## 2022-01-02 DIAGNOSIS — E785 Hyperlipidemia, unspecified: Secondary | ICD-10-CM | POA: Diagnosis present

## 2022-01-02 DIAGNOSIS — K5730 Diverticulosis of large intestine without perforation or abscess without bleeding: Secondary | ICD-10-CM | POA: Diagnosis not present

## 2022-01-02 DIAGNOSIS — E8809 Other disorders of plasma-protein metabolism, not elsewhere classified: Secondary | ICD-10-CM | POA: Diagnosis not present

## 2022-01-02 DIAGNOSIS — E870 Hyperosmolality and hypernatremia: Secondary | ICD-10-CM | POA: Diagnosis present

## 2022-01-02 DIAGNOSIS — I6521 Occlusion and stenosis of right carotid artery: Secondary | ICD-10-CM | POA: Diagnosis not present

## 2022-01-02 DIAGNOSIS — J9600 Acute respiratory failure, unspecified whether with hypoxia or hypercapnia: Secondary | ICD-10-CM | POA: Diagnosis not present

## 2022-01-02 DIAGNOSIS — E871 Hypo-osmolality and hyponatremia: Secondary | ICD-10-CM | POA: Diagnosis not present

## 2022-01-02 DIAGNOSIS — I63231 Cerebral infarction due to unspecified occlusion or stenosis of right carotid arteries: Secondary | ICD-10-CM | POA: Diagnosis not present

## 2022-01-02 DIAGNOSIS — J690 Pneumonitis due to inhalation of food and vomit: Secondary | ICD-10-CM | POA: Diagnosis not present

## 2022-01-02 DIAGNOSIS — I63511 Cerebral infarction due to unspecified occlusion or stenosis of right middle cerebral artery: Secondary | ICD-10-CM | POA: Diagnosis present

## 2022-01-02 DIAGNOSIS — I10 Essential (primary) hypertension: Secondary | ICD-10-CM | POA: Diagnosis not present

## 2022-01-02 DIAGNOSIS — Z96653 Presence of artificial knee joint, bilateral: Secondary | ICD-10-CM | POA: Diagnosis not present

## 2022-01-02 DIAGNOSIS — H9190 Unspecified hearing loss, unspecified ear: Secondary | ICD-10-CM | POA: Diagnosis not present

## 2022-01-02 DIAGNOSIS — Z4682 Encounter for fitting and adjustment of non-vascular catheter: Secondary | ICD-10-CM | POA: Diagnosis not present

## 2022-01-02 DIAGNOSIS — R6521 Severe sepsis with septic shock: Secondary | ICD-10-CM | POA: Diagnosis not present

## 2022-01-02 DIAGNOSIS — E039 Hypothyroidism, unspecified: Secondary | ICD-10-CM | POA: Diagnosis not present

## 2022-01-02 DIAGNOSIS — I639 Cerebral infarction, unspecified: Secondary | ICD-10-CM | POA: Diagnosis not present

## 2022-01-02 DIAGNOSIS — R42 Dizziness and giddiness: Secondary | ICD-10-CM | POA: Diagnosis not present

## 2022-01-02 DIAGNOSIS — Z7189 Other specified counseling: Secondary | ICD-10-CM | POA: Diagnosis not present

## 2022-01-02 DIAGNOSIS — Z79899 Other long term (current) drug therapy: Secondary | ICD-10-CM | POA: Diagnosis not present

## 2022-01-02 DIAGNOSIS — I6930 Unspecified sequelae of cerebral infarction: Secondary | ICD-10-CM | POA: Diagnosis not present

## 2022-01-02 DIAGNOSIS — I129 Hypertensive chronic kidney disease with stage 1 through stage 4 chronic kidney disease, or unspecified chronic kidney disease: Secondary | ICD-10-CM | POA: Diagnosis present

## 2022-01-02 DIAGNOSIS — R001 Bradycardia, unspecified: Secondary | ICD-10-CM | POA: Diagnosis not present

## 2022-01-02 DIAGNOSIS — I69354 Hemiplegia and hemiparesis following cerebral infarction affecting left non-dominant side: Secondary | ICD-10-CM | POA: Diagnosis not present

## 2022-01-02 DIAGNOSIS — G8929 Other chronic pain: Secondary | ICD-10-CM | POA: Diagnosis present

## 2022-01-02 DIAGNOSIS — R29810 Facial weakness: Secondary | ICD-10-CM | POA: Diagnosis present

## 2022-01-02 DIAGNOSIS — R6520 Severe sepsis without septic shock: Secondary | ICD-10-CM | POA: Diagnosis not present

## 2022-01-02 DIAGNOSIS — N179 Acute kidney failure, unspecified: Secondary | ICD-10-CM | POA: Diagnosis not present

## 2022-01-02 DIAGNOSIS — I69391 Dysphagia following cerebral infarction: Secondary | ICD-10-CM | POA: Diagnosis not present

## 2022-01-02 DIAGNOSIS — E43 Unspecified severe protein-calorie malnutrition: Secondary | ICD-10-CM | POA: Insufficient documentation

## 2022-01-02 DIAGNOSIS — I69322 Dysarthria following cerebral infarction: Secondary | ICD-10-CM

## 2022-01-02 DIAGNOSIS — E875 Hyperkalemia: Secondary | ICD-10-CM | POA: Diagnosis not present

## 2022-01-02 DIAGNOSIS — G8194 Hemiplegia, unspecified affecting left nondominant side: Secondary | ICD-10-CM | POA: Diagnosis not present

## 2022-01-02 DIAGNOSIS — E440 Moderate protein-calorie malnutrition: Secondary | ICD-10-CM | POA: Diagnosis not present

## 2022-01-02 DIAGNOSIS — Z9862 Peripheral vascular angioplasty status: Secondary | ICD-10-CM

## 2022-01-02 LAB — CBC WITH DIFFERENTIAL/PLATELET
Abs Immature Granulocytes: 0.11 10*3/uL — ABNORMAL HIGH (ref 0.00–0.07)
Basophils Absolute: 0 10*3/uL (ref 0.0–0.1)
Basophils Relative: 0 %
Eosinophils Absolute: 0 10*3/uL (ref 0.0–0.5)
Eosinophils Relative: 0 %
HCT: 24.7 % — ABNORMAL LOW (ref 39.0–52.0)
Hemoglobin: 8.2 g/dL — ABNORMAL LOW (ref 13.0–17.0)
Immature Granulocytes: 1 %
Lymphocytes Relative: 10 %
Lymphs Abs: 0.8 10*3/uL (ref 0.7–4.0)
MCH: 28.3 pg (ref 26.0–34.0)
MCHC: 33.2 g/dL (ref 30.0–36.0)
MCV: 85.2 fL (ref 80.0–100.0)
Monocytes Absolute: 2.2 10*3/uL — ABNORMAL HIGH (ref 0.1–1.0)
Monocytes Relative: 26 %
Neutro Abs: 5.2 10*3/uL (ref 1.7–7.7)
Neutrophils Relative %: 63 %
Platelets: 154 10*3/uL (ref 150–400)
RBC: 2.9 MIL/uL — ABNORMAL LOW (ref 4.22–5.81)
RDW: 16.6 % — ABNORMAL HIGH (ref 11.5–15.5)
WBC: 8.3 10*3/uL (ref 4.0–10.5)
nRBC: 0 % (ref 0.0–0.2)

## 2022-01-02 LAB — BASIC METABOLIC PANEL
Anion gap: 7 (ref 5–15)
BUN: 25 mg/dL — ABNORMAL HIGH (ref 8–23)
CO2: 17 mmol/L — ABNORMAL LOW (ref 22–32)
Calcium: 8.1 mg/dL — ABNORMAL LOW (ref 8.9–10.3)
Chloride: 123 mmol/L — ABNORMAL HIGH (ref 98–111)
Creatinine, Ser: 1.45 mg/dL — ABNORMAL HIGH (ref 0.61–1.24)
GFR, Estimated: 46 mL/min — ABNORMAL LOW (ref >60)
Glucose, Bld: 123 mg/dL — ABNORMAL HIGH (ref 70–99)
Potassium: 4.1 mmol/L (ref 3.5–5.1)
Sodium: 147 mmol/L — ABNORMAL HIGH (ref 135–145)

## 2022-01-02 MED ORDER — ASPIRIN 81 MG PO CHEW
81.0000 mg | CHEWABLE_TABLET | Freq: Every day | ORAL | Status: DC
  Administered 2022-01-03 – 2022-01-10 (×8): 81 mg via ORAL
  Filled 2022-01-02 (×8): qty 1

## 2022-01-02 MED ORDER — ACETAMINOPHEN 325 MG PO TABS
650.0000 mg | ORAL_TABLET | ORAL | Status: DC | PRN
  Administered 2022-01-03 – 2022-01-06 (×3): 650 mg via ORAL
  Filled 2022-01-02 (×3): qty 2

## 2022-01-02 MED ORDER — AMLODIPINE BESYLATE 5 MG PO TABS
5.0000 mg | ORAL_TABLET | Freq: Every day | ORAL | Status: DC
  Administered 2022-01-03 – 2022-01-10 (×8): 5 mg via ORAL
  Filled 2022-01-02 (×8): qty 1

## 2022-01-02 MED ORDER — ENOXAPARIN SODIUM 30 MG/0.3ML IJ SOSY: 30.0000 mg | PREFILLED_SYRINGE | INTRAMUSCULAR | Status: DC

## 2022-01-02 MED ORDER — ASPIRIN 81 MG PO CHEW
81.0000 mg | CHEWABLE_TABLET | Freq: Every day | ORAL | Status: DC
  Filled 2022-01-02 (×3): qty 1

## 2022-01-02 MED ORDER — ROSUVASTATIN CALCIUM 20 MG PO TABS
20.0000 mg | ORAL_TABLET | Freq: Every day | ORAL | Status: DC
  Administered 2022-01-03 – 2022-01-10 (×8): 20 mg via ORAL
  Filled 2022-01-02 (×8): qty 1

## 2022-01-02 MED ORDER — TICAGRELOR 90 MG PO TABS: 90.0000 mg | ORAL_TABLET | Freq: Two times a day (BID) | ORAL | Status: AC

## 2022-01-02 MED ORDER — SORBITOL 70 % SOLN
30.0000 mL | Freq: Once | Status: AC
  Administered 2022-01-02: 30 mL via ORAL
  Filled 2022-01-02: qty 30

## 2022-01-02 MED ORDER — ASPIRIN 81 MG PO CHEW: 81.0000 mg | CHEWABLE_TABLET | Freq: Every day | ORAL | Status: AC

## 2022-01-02 MED ORDER — ACETAMINOPHEN 650 MG RE SUPP: 650.0000 mg | RECTAL | Status: DC | PRN

## 2022-01-02 MED ORDER — TICAGRELOR 90 MG PO TABS
90.0000 mg | ORAL_TABLET | Freq: Two times a day (BID) | ORAL | Status: DC
  Filled 2022-01-02 (×4): qty 1

## 2022-01-02 MED ORDER — TICAGRELOR 90 MG PO TABS
90.0000 mg | ORAL_TABLET | Freq: Two times a day (BID) | ORAL | Status: DC
  Administered 2022-01-02 – 2022-01-10 (×16): 90 mg via ORAL
  Filled 2022-01-02 (×15): qty 1

## 2022-01-02 MED ORDER — ATENOLOL 25 MG PO TABS
100.0000 mg | ORAL_TABLET | Freq: Every morning | ORAL | Status: DC
  Administered 2022-01-03 – 2022-01-10 (×8): 100 mg via ORAL
  Filled 2022-01-02 (×8): qty 4

## 2022-01-02 MED ORDER — HYDROCODONE-ACETAMINOPHEN 7.5-325 MG PO TABS
1.0000 | ORAL_TABLET | Freq: Three times a day (TID) | ORAL | Status: DC | PRN
  Administered 2022-01-05 – 2022-01-10 (×2): 1 via ORAL
  Filled 2022-01-02 (×2): qty 1

## 2022-01-02 MED ORDER — ENOXAPARIN SODIUM 30 MG/0.3ML IJ SOSY
30.0000 mg | PREFILLED_SYRINGE | INTRAMUSCULAR | Status: DC
  Administered 2022-01-02 – 2022-01-14 (×13): 30 mg via SUBCUTANEOUS
  Filled 2022-01-02 (×13): qty 0.3

## 2022-01-02 MED ORDER — ACETAMINOPHEN 160 MG/5ML PO SOLN
650.0000 mg | ORAL | Status: DC | PRN
  Administered 2022-01-14: 650 mg
  Filled 2022-01-02: qty 20.3

## 2022-01-02 MED ORDER — ROSUVASTATIN CALCIUM 20 MG PO TABS: 20.0000 mg | ORAL_TABLET | Freq: Every day | ORAL | Status: AC

## 2022-01-02 MED ORDER — TAMSULOSIN HCL 0.4 MG PO CAPS
0.4000 mg | ORAL_CAPSULE | Freq: Every day | ORAL | Status: DC
  Administered 2022-01-03 – 2022-01-06 (×4): 0.4 mg via ORAL
  Filled 2022-01-02 (×4): qty 1

## 2022-01-02 NOTE — Progress Notes (Deleted)
Physician Discharge Summary  Chad [name] [id number] DOB: 1932-03-18 DOA: 12/29/2021  PCP: [name], MD  Admit date: 12/29/2021 Discharge date: 01/02/2022  Admitted From: Home Disposition: CIR  Discharge Condition:Stable CODE STATUS:FULL Diet recommendation: Dysphagia 1  Brief/Interim Summary:  Patient is a 88 year old male with history of hypothyroidism, chronic pain syndrome, CKD stage IIIa hypertension, arthritis who presented with a fall from home.  Patient lives alone.  Patient was also found to have left-sided weakness, left-sided facial droop, slurred speech.  CT head showed age-indeterminate lacunar infarct within the right basal ganglia.  Stroke suspected and was admitted.  Neurology consulted.  MRI showed right MCA distribution infarcts,.  Stroke work-up initiated.  Imagings also showed occlusion of a proximal right M2 MCA branch .  Cerebral angiogram showed severe high-grade stenosis of right internal carotid artery status post balloon angioplasty.  PT/OT recommending CIR on discharge.  Medically stable for discharge today.  Following problems were addressed during his hospitalization:   Acute ischemic stroke: Presented with fall.  Found to have left-sided weakness.  Neurology were following.   MRI showed cluster of small cortical and white matter infarcts within the right MCA distribution.Intracranial MRA showed focal high-grade narrowing at the right M2 level .  CT angio head and neck also showed 80% stenosis of the proximal ICA,severe stenosis of the non dominant right vertebral artery origin. IR consulted,cerebral angiogram showed severe high-grade stenosis of right internal carotid artery status post balloon angioplasty.  Echo showed EF of 55 to 88%, grade 1 diastolic dysfunction no atrial level shunt or any intracardiac source of emboli.   Hemoglobin A1c 6.1.  LDL 90.  Started on a statin, aspirin, Brilinta. Currently he is alert oriented.  At baseline, he lives  alone, can walk, cooks his own food.  Has dense left hemiplegia now   Hypertension: Takes amlodipine, atenolol at home.  Resumed   CKD stage IIIa: Currently kidney function at Baseline.   Hyperlipidemia: Continue crestor   Arthritis: Continue supportive care.     Discharge Diagnoses:  Principal Problem:   Stroke (cerebrum) ([place]) Active Problems:   CKD (chronic kidney disease) stage 3, GFR 30-59 ml/min ([facility]CC)   Anemia   Hyperlipidemia   Essential hypertension   Internal carotid artery stenosis, right    Discharge Instructions  Discharge Instructions     Diet general   Complete by: As directed    Dysphagia 1 diet   Discharge instructions   Complete by: As directed    1)Follow up with neurology 2)Take prescribed medication as instructed 3)Do a Bmp test in 3 days   Increase activity slowly   Complete by: As directed    No wound care   Complete by: As directed       Allergies as of 01/02/2022   No Known Allergies      Medication List     STOP taking these medications    lovastatin 40 MG tablet Commonly known as: MEVACOR   methocarbamol 500 MG tablet Commonly known as: ROBAXIN   polyethylene glycol 17 g packet Commonly known as: MIRALAX / GLYCOLAX       TAKE these medications    amLODipine 5 MG tablet Commonly known as: NORVASC Take 5 mg by mouth daily.   aspirin 81 MG chewable tablet Chew 1 tablet (81 mg total) by mouth daily. Start taking on: January 03, 2022   atenolol 100 MG tablet Commonly known as: TENORMIN Take 100 mg by mouth every morning.   docusate sodium 100  MG capsule Commonly known as: COLACE Take 1 capsule (100 mg total) by mouth 2 (two) times daily.   ferrous sulfate 325 (65 FE) MG tablet Take 1 tablet (325 mg total) by mouth 2 (two) times daily with a meal for 14 days.   HYDROcodone-acetaminophen 7.5-325 MG tablet Commonly known as: NORCO Take 1 tablet by mouth 3 (three) times daily as needed for moderate pain. What changed:  Another medication with the same name was removed. Continue taking this medication, and follow the directions you see here.   rosuvastatin 20 MG tablet Commonly known as: CRESTOR Take 1 tablet (20 mg total) by mouth daily. Start taking on: January 03, 2022   tamsulosin 0.4 MG Caps capsule Commonly known as: FLOMAX Take 0.4 mg by mouth daily.   ticagrelor 90 MG Tabs tablet Commonly known as: BRILINTA Take 1 tablet (90 mg total) by mouth 2 (two) times daily.        No Known Allergies  Consultations: Neurology   Procedures/Studies: CT ANGIO HEAD NECK W WO CM  Result Date: 12/30/2021 CLINICAL DATA:  Stroke/TIA, determine embolic source EXAM: CT ANGIOGRAPHY HEAD AND NECK TECHNIQUE: Multidetector CT imaging of the head and neck was performed using the standard protocol during bolus administration of intravenous contrast. Multiplanar CT image reconstructions and MIPs were obtained to evaluate the vascular anatomy. Carotid stenosis measurements (when applicable) are obtained utilizing NASCET criteria, using the distal internal carotid diameter as the denominator. RADIATION DOSE REDUCTION: This exam was performed according to the departmental dose-optimization program which includes automated exposure control, adjustment of the mA and/or kV according to patient size and/or use of iterative reconstruction technique. CONTRAST:  59mL OMNIPAQUE IOHEXOL 350 MG/ML SOLN COMPARISON:  Same day MRI/MRA. FINDINGS: CT HEAD FINDINGS Brain: Small right MCA territory infarcts better characterized on same day MRI. No evidence of acute hemorrhage, mass effect, midline shift, extra-axial fluid collection, or hydrocephalus. Patchy white matter hypodensities, nonspecific but compatible with chronic microvascular ischemic disease. Vascular: Detailed below. Skull: No acute fracture. Sinuses: Mild paranasal sinus mucosal thickening. Orbits: No acute orbital findings. Review of the MIP images confirms the above findings CTA  NECK FINDINGS Aortic arch: Calcific atherosclerosis of the aorta and great vessel origins. Ectatic right subclavian artery. Right carotid system: Atherosclerosis at the carotid bifurcation with approximately 80% stenosis. Left carotid system: Atherosclerosis at the carotid bifurcation without greater than 50% stenosis. Vertebral arteries: Potentially severe stenosis of the right vertebral artery origin due to calcific atherosclerosis. Otherwise, vertebral arteries are patent without significant (greater than 50%) stenosis. Left dominant. Skeleton: Severe multilevel degenerative change. Other neck: No acute findings. Upper chest: Clear sinuses. Review of the MIP images confirms the above findings CTA HEAD FINDINGS Anterior circulation: Bilateral intracranial ICAs are patent with mild narrowing due to calcific atherosclerosis. Bilateral M1 MCAs and left M2 MCAs are patent. Apparent occlusion of a right M2 MCA branch in a region of calcification. Bilateral ACAs are patent. Posterior circulation: Left dominant intradural vertebral artery. Right intradural vertebral artery makes very small contribution to the basilar artery. Basilar artery and bilateral posterior cerebral arteries are patent without proximal hemodynamically significant stenosis. Venous sinuses: As permitted by contrast timing, patent. Anatomic variants: Detailed above. Review of the MIP images confirms the above findings IMPRESSION: CTA: 1. 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 2. Extensive calcific atherosclerosis at the right carotid bifurcation with approximately 80%  stenosis of the proximal ICA. 3. 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 CT head: Small right MCA territory infarcts better characterized on same day MRI. No evidence of progressive  mass effect or acute hemorrhage. Findings discussed with Dr. Erlinda Hong via telephone at 12:50 p.m. Electronically Signed   By: Margaretha Sheffield M.D.   On: 12/30/2021 13:00   DG Elbow Complete Left  Result Date: 12/29/2021 CLINICAL DATA:  Fall.  Left elbow pain and bruising. EXAM: LEFT ELBOW - COMPLETE 3+ VIEW COMPARISON:  None Available. FINDINGS: No fracture or dislocation. Narrowed radiocapitellar joint. Marginal osteophytes project from the base of the radial head. No joint effusion. There is soft tissue edema posteriorly. IMPRESSION: No fracture or dislocation. Electronically Signed   By: Lajean Manes M.D.   On: 12/29/2021 18:12   CT HEAD WO CONTRAST  Result Date: 12/29/2021 CLINICAL DATA:  Neurologic deficit EXAM: CT HEAD WITHOUT CONTRAST TECHNIQUE: Contiguous axial images were obtained from the base of the skull through the vertex without intravenous contrast. RADIATION DOSE REDUCTION: This exam was performed according to the departmental dose-optimization program which includes automated exposure control, adjustment of the mA and/or kV according to patient size and/or use of iterative reconstruction technique. COMPARISON:  None Available. FINDINGS: Brain: Confluent hypodensities throughout the periventricular white matter are most consistent with age-indeterminate small vessel ischemic changes, likely chronic. Focal hypodensities in the left basal ganglia and right frontal periventricular white matter consistent with chronic lacunar infarcts. Age-indeterminate lacunar infarct is seen within the right basal ganglia image 18/3. No other signs of acute infarct or hemorrhage. Lateral ventricles and midline structures are otherwise unremarkable. No acute extra-axial fluid collections. No mass effect. Vascular: No hyperdense vessel or unexpected calcification. Skull: Normal. Negative for fracture or focal lesion. Sinuses/Orbits: No acute finding. Other: None. IMPRESSION: 1. Age indeterminate lacunar infarct  within the right basal ganglia. 2. Age-indeterminate small-vessel ischemic changes throughout the periventricular white matter, favor chronic. 3. Chronic left basal ganglia and right frontal white matter lacunar infarcts as above. 4. No acute hemorrhage. Electronically Signed   By: Randa Ngo M.D.   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 Multiplanar CT image reconstructions were also generated. RADIATION DOSE REDUCTION: This exam was performed according to the departmental dose-optimization program which includes automated exposure control, adjustment of the mA and/or kV according to patient size and/or use of iterative reconstruction technique. COMPARISON:  None Available. FINDINGS: Alignment: Mild degenerative anterolisthesis of C2 and C3. Otherwise alignment is anatomic. Skull base and vertebrae: No acute fracture. No primary bone lesion or focal pathologic process. Soft tissues and spinal canal: No prevertebral fluid or swelling. No visible canal hematoma. Disc levels: Partial bony fusion across the disc spaces at C3-4 and C4-5. There is severe multilevel spondylosis throughout the remainder of the cervical spine, greatest at C5-6 and C6-7. Diffuse facet hypertrophy. Upper chest: Airway is patent. Lung apices are clear. Prominent atherosclerosis of the aortic arch. Other: Reconstructed images demonstrate no additional findings. IMPRESSION: 1. Extensive multilevel cervical degenerative changes. No acute fracture. Electronically Signed   By: Randa Ngo M.D.   On: 12/29/2021 17:38   MR ANGIO HEAD WO CONTRAST  Result Date: 12/30/2021 CLINICAL DATA:  There are deficit with acute stroke suspected EXAM: MRI HEAD WITHOUT CONTRAST MRA  HEAD WITHOUT CONTRAST TECHNIQUE:  Multiplanar, multi-echo pulse sequences of the brain and surrounding structures were acquired without intravenous contrast. Angiographic images of the Circle of Willis were acquired using MRA technique without intravenous contrast. COMPARISON:  Head CT from yesterday FINDINGS: MRI HEAD FINDINGS Brain: Cluster of small acute infarcts in the right insular cortex, right frontal cortex, and deep right cerebral white matter. These are in a MCA distribution. Background of advanced chronic small vessel ischemia with confluent gliosis in the hemispheric white matter. Chronic lacunar infarcts in the deep cerebrum. Small remote left cerebral infarct. Gradient signal at the lower right sylvian fissure with there is calcification along the MCA branches by CT. Vascular: Major flow voids are preserved Skull and upper cervical spine: Normal marrow signal Sinuses/Orbits: Bilateral cataract resection MRA HEAD FINDINGS Anterior circulation: High-grade narrowing involving the upper right M2 branch with there is a gradient hypointensity noted above. Elsewhere vessels are smoothly contoured and widely patent. Posterior circulation: Vertebrobasilar arteries are smoothly contoured and widely patent. IMPRESSION: Brain MRI: 1. Cluster of small cortical and white matter infarcts within the right MCA distribution. 2. Background of advanced chronic small vessel ischemia. Intracranial MRA: Focal high-grade narrowing at the right M2 level where there is calcification by CT. This could reflect calcified plaque or a calcified embolus. If an embolus, the proximal nature and degree of mild infarct with suggest additional territory of risk. Electronically Signed   By: Jorje Guild M.D.   On: 12/30/2021 07:53   MR BRAIN WO CONTRAST  Result Date: 12/30/2021 CLINICAL DATA:  There are deficit with acute stroke suspected EXAM: MRI HEAD WITHOUT CONTRAST MRA HEAD WITHOUT CONTRAST TECHNIQUE: Multiplanar, multi-echo pulse sequences of the brain and  surrounding structures were acquired without intravenous contrast. Angiographic images of the Circle of Willis were acquired using MRA technique without intravenous contrast. COMPARISON:  Head CT from yesterday FINDINGS: MRI HEAD FINDINGS Brain: Cluster of small acute infarcts in the right insular cortex, right frontal cortex, and deep right cerebral white matter. These are in a MCA distribution. Background of advanced chronic small vessel ischemia with confluent gliosis in the hemispheric white matter. Chronic lacunar infarcts in the deep cerebrum. Small remote left cerebral infarct. Gradient signal at the lower right sylvian fissure with there is calcification along the MCA branches by CT. Vascular: Major flow voids are preserved Skull and upper cervical spine: Normal marrow signal Sinuses/Orbits: Bilateral cataract resection MRA HEAD FINDINGS Anterior circulation: High-grade narrowing involving the upper right M2 branch with there is a gradient hypointensity noted above. Elsewhere vessels are smoothly contoured and widely patent. Posterior circulation: Vertebrobasilar arteries are smoothly contoured and widely patent. IMPRESSION: Brain MRI: 1. Cluster of small cortical and white matter infarcts within the right MCA distribution. 2. Background of advanced chronic small vessel ischemia. Intracranial MRA: Focal high-grade narrowing at the right M2 level where there is calcification by CT. This could reflect calcified plaque or a calcified embolus. If an embolus, the proximal nature and degree of mild infarct with suggest additional territory of risk. Electronically Signed   By: Jorje Guild M.D.   On: 12/30/2021 07:53   DG Chest Portable 1 View  Result Date: 12/29/2021 CLINICAL DATA:  CVA. Fell last night. Family concerned patient injured left side EXAM: PORTABLE CHEST 1 VIEW COMPARISON:  Chest two views 03/23/2013 FINDINGS: Cardiac silhouette is mildly enlarged. Moderate calcifications within the aortic arch  and descending thoracic aorta. Minimal bibasilar horizontal linear chronic scarring is unchanged. No focal airspace opacity to indicate  pneumonia. No pleural effusion or pneumothorax. Moderate multilevel degenerative disc changes of the thoracic spine. IMPRESSION: Mild stable cardiomegaly. No acute lung process. Electronically Signed   By: Yvonne Kendall M.D.   On: 12/29/2021 19:19   DG Hand Complete Left  Result Date: 12/29/2021 CLINICAL DATA:  Fall.  Bruising to posterior left hand. EXAM: LEFT HAND - COMPLETE 3+ VIEW COMPARISON:  None Available. FINDINGS: There is diffuse decreased bone mineralization. A pulse oximeter overlies the distal index finger obscuring portions. Degenerative changes including joint space narrowing, subchondral sclerosis and peripheral osteophytosis are moderate at the triscaphe joint; moderate to severe at the thumb carpometacarpal, thumb metacarpophalangeal, and thumb interphalangeal joints; and moderate to severe at the DIP joints and moderate at the PIP joints of the second through fifth fingers. No acute fracture is seen. No dislocation. IMPRESSION: Osteoarthritis as above. No acute fracture. Electronically Signed   By: Yvonne Kendall M.D.   On: 12/29/2021 18:12   DG Swallowing Func-Speech Pathology  Result Date: 01/02/2022 Table formatting from the original result was not included. Objective Swallowing Evaluation: Type of Study: MBS-Modified Barium Swallow Study  Patient Details Name: Chad [name] MRN: [id number] Date of Birth: [birth year] Today's Date: 01/02/2022 Time: SLP Start Time (ACUTE ONLY): 1100 -SLP Stop Time (ACUTE ONLY): 1123 SLP Time Calculation (min) (ACUTE ONLY): 23 min Past Medical History: Past Medical History: Diagnosis Date  Arthritis   Heart murmur   asa child   Hypertension   Stroke [facility]  Past Surgical History: Past Surgical History: Procedure Laterality Date  EYE SURGERY Bilateral 2022  JOINT REPLACEMENT    RADIOLOGY WITH ANESTHESIA N/A 01/01/2022  Procedure:  Angiogram;  Surgeon: Luanne Bras, MD;  Location: [facility];  Service: Radiology;  Laterality: N/A;  TONSILLECTOMY    TOTAL KNEE ARTHROPLASTY Right 03/30/2013  Procedure: RIGHT TOTAL KNEE ARTHROPLASTY;  Surgeon: Tobi Bastos, MD;  Location: [facility];  Service: Orthopedics;  Laterality: Right;  TOTAL KNEE ARTHROPLASTY Left 10/09/2020  Procedure: TOTAL KNEE ARTHROPLASTY;  Surgeon: Paralee Cancel, MD;  Location: [facility];  Service: Orthopedics;  Laterality: Left;  70 mins HPI: 88 yo male presenting to the ED on 5/28 with L sided weakness, facial droop, slurred speech, and fall. 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 PMH including hypothyroidism chronic pain syndromes, CKD, HTN, arthritis, bil TKA.  Subjective: alert, friendly  Recommendations for follow up therapy are one component of a multi-disciplinary discharge planning process, led by the attending physician.  Recommendations may be updated based on patient status, additional functional criteria and insurance authorization. Assessment / Plan / Recommendation   01/02/2022  11:00 AM Clinical Impressions Clinical Impression Pt presents with oropharyngeal dysphagia characterized by reduced labial seal, impaired mastication, a pharyngeal delay and reduction in bolus cohesion, posterior bolus propulsion, tongue base retraction, anterior laryngeal movement, and pharyngeal constriction. He demonstrated left-sided anterior spillage, lingual residue, difficulty with A-P transport of solids, premature spillage to the valleculae and pyriform sinuses, and posterior pharyngeal wall residue. A liquid bolus was necessary to facilitate mastication and A-P transport of a nutrigrain bar. Liquid washes reduced pharyngeal residue to a more functional level and a left head turn was effective in reducing pyriform sinus residue with thin liquids, but pt exhibited notable difficulty maintaining this position. Penetration (PAS 3,  5) and aspiration (PAS 7, 8) were noted with thin liquids. Larger amounts of aspirate did trigger a cough which was mostly effective, but smaller quantities inconsistently triggered throat clearing  which was inadequate for expulsion of material. No functional benefit was noted with prompted coughing. A dysphagia 1 diet with nectar thick liquids is recommended at this time. SLP will follow for dysphagia treatment. SLP Visit Diagnosis Dysphagia, oropharyngeal phase (R13.12) Impact on safety and function Mild aspiration risk     01/02/2022  11:00 AM Treatment Recommendations Treatment Recommendations Therapy as outlined in treatment plan below     01/02/2022  11:00 AM Prognosis Prognosis for Safe Diet Advancement Good   01/02/2022  11:00 AM Diet Recommendations SLP Diet Recommendations Dysphagia 1 (Puree) solids;Nectar thick liquid Liquid Administration via Cup;Straw Medication Administration Whole meds with puree Compensations Slow rate;Small sips/bites;Monitor for anterior loss;Follow solids with liquid Postural Changes Seated upright at 90 degrees     01/02/2022  11:00 AM Other Recommendations Other Recommendations Order thickener from pharmacy Follow Up Recommendations Acute inpatient rehab (3hours/day) Assistance recommended at discharge Frequent or constant Supervision/Assistance Functional Status Assessment Patient has had a recent decline in their functional status and demonstrates the ability to make significant improvements in function in a reasonable and predictable amount of time.   01/02/2022  11:00 AM Frequency and Duration  Speech Therapy Frequency (ACUTE ONLY) min 2x/week Treatment Duration 2 weeks     01/02/2022  11:00 AM Oral Phase Oral Phase Impaired Oral - Nectar Cup Left anterior bolus loss;Reduced posterior propulsion;Lingual/palatal residue;Delayed oral transit;Decreased bolus cohesion;Premature spillage Oral - Nectar Straw Left anterior bolus loss;Reduced posterior propulsion;Lingual/palatal residue;Delayed  oral transit;Decreased bolus cohesion;Premature spillage Oral - Thin Cup Left anterior bolus loss;Reduced posterior propulsion;Lingual/palatal residue;Delayed oral transit;Decreased bolus cohesion;Premature spillage Oral - Thin Straw Left anterior bolus loss;Reduced posterior propulsion;Lingual/palatal residue;Delayed oral transit;Decreased bolus cohesion;Premature spillage Oral - Puree Left anterior bolus loss;Reduced posterior propulsion;Lingual/palatal residue;Delayed oral transit;Decreased bolus cohesion;Weak lingual manipulation Oral - Mech Soft Left anterior bolus loss;Reduced posterior propulsion;Lingual/palatal residue;Delayed oral transit;Decreased bolus cohesion;Weak lingual manipulation;Impaired mastication Oral - Pill Left anterior bolus loss;Reduced posterior propulsion;Lingual/palatal residue;Delayed oral transit;Decreased bolus cohesion    01/02/2022  11:00 AM Pharyngeal Phase Pharyngeal Phase Impaired Pharyngeal- Thin Cup Reduced tongue base retraction;Reduced anterior laryngeal mobility;Delayed swallow initiation-pyriform sinuses;Reduced pharyngeal peristalsis;Penetration/Aspiration during swallow;Penetration/Apiration after swallow;Pharyngeal residue - valleculae;Pharyngeal residue - pyriform;Pharyngeal residue - posterior pharnyx Pharyngeal Material enters airway, remains ABOVE vocal cords and not ejected out;Material enters airway, CONTACTS cords and not ejected out;Material enters airway, passes BELOW cords and not ejected out despite cough attempt by patient;Material enters airway, passes BELOW cords without attempt by patient to eject out (silent aspiration) Pharyngeal- Thin Straw Reduced tongue base retraction;Reduced anterior laryngeal mobility;Delayed swallow initiation-pyriform sinuses;Reduced pharyngeal peristalsis;Penetration/Aspiration during swallow;Penetration/Apiration after swallow;Pharyngeal residue - valleculae;Pharyngeal residue - pyriform;Pharyngeal residue - posterior pharnyx  Pharyngeal Material enters airway, passes BELOW cords and not ejected out despite cough attempt by patient;Material enters airway, passes BELOW cords without attempt by patient to eject out (silent aspiration);Material enters airway, CONTACTS cords and then ejected out Pharyngeal- Puree Reduced tongue base retraction;Reduced anterior laryngeal mobility;Delayed swallow initiation-pyriform sinuses;Reduced pharyngeal peristalsis;Pharyngeal residue - valleculae;Pharyngeal residue - pyriform;Pharyngeal residue - posterior pharnyx Pharyngeal- Mechanical Soft Reduced tongue base retraction;Reduced anterior laryngeal mobility;Delayed swallow initiation-pyriform sinuses;Reduced pharyngeal peristalsis;Pharyngeal residue - valleculae;Pharyngeal residue - pyriform;Pharyngeal residue - posterior pharnyx Pharyngeal- Pill Reduced tongue base retraction;Reduced anterior laryngeal mobility;Delayed swallow initiation-pyriform sinuses;Reduced pharyngeal peristalsis;Pharyngeal residue - valleculae;Pharyngeal residue - pyriform;Pharyngeal residue - posterior pharnyx    01/02/2022  11:00 AM Cervical Esophageal Phase  Cervical Esophageal Phase [facility] Shanika I. Hardin Negus, [place], [place] Office number [phone] Pager Plato 01/02/2022, 12:20 PM  ECHOCARDIOGRAM COMPLETE  Result Date: 12/30/2021    ECHOCARDIOGRAM REPORT   Patient Name:   Chad [name] Date of Exam: 12/30/2021 Medical Rec #:  [id number]         Height:       66.0 in Accession #:    [id number]        Weight:       133.6 lb Date of Birth:  1932/04/06          BSA:          1.685 m Patient Age:    32 years          BP:           161/84 mmHg Patient Gender: M                 HR:           66 bpm. Exam Location:  Inpatient Procedure: 2D Echo Indications:    stroke  History:        Patient has prior history of Echocardiogram examinations, most                 recent 04/25/2020. Chronic kidney disease.; Risk                  Factors:Hypertension and Dyslipidemia.  Sonographer:    Johny Chess RDCS Referring Phys: Del Muerto  1. Left ventricular ejection fraction, by estimation, is 55 to 60%. The left ventricle has normal function. The left ventricle has no regional wall motion abnormalities. There is mild left ventricular hypertrophy. Left ventricular diastolic parameters are consistent with Grade I diastolic dysfunction (impaired relaxation).  2. Right ventricular systolic function is normal. The right ventricular size is normal.  3. Left atrial size was mildly dilated.  4. The mitral valve is normal in structure. Mild mitral valve regurgitation. No evidence of mitral stenosis.  5. The aortic valve is tricuspid. Aortic valve regurgitation is mild. Aortic valve sclerosis/calcification is present, without any evidence of aortic stenosis.  6. The inferior vena cava is normal in size with greater than 50% respiratory variability, suggesting right atrial pressure of 3 mmHg. FINDINGS  Left Ventricle: Left ventricular ejection fraction, by estimation, is 55 to 60%. The left ventricle has normal function. The left ventricle has no regional wall motion abnormalities. The left ventricular internal cavity size was normal in size. There is  mild left ventricular hypertrophy. Left ventricular diastolic parameters are consistent with Grade I diastolic dysfunction (impaired relaxation). Right Ventricle: The right ventricular size is normal. Right ventricular systolic function is normal. Left Atrium: Left atrial size was mildly dilated. Right Atrium: Right atrial size was normal in size. Pericardium: Trivial pericardial effusion is present. Mitral Valve: The mitral valve is normal in structure. Mild mitral annular calcification. Mild mitral valve regurgitation. No evidence of mitral valve stenosis. Tricuspid Valve: The tricuspid valve is normal in structure. Tricuspid valve regurgitation is trivial. No evidence  of tricuspid stenosis. Aortic Valve: The aortic valve is tricuspid. Aortic valve regurgitation is mild. Aortic regurgitation PHT measures 400 msec. Aortic valve sclerosis/calcification is present, without any evidence of aortic stenosis. Pulmonic Valve: The pulmonic valve was normal in structure. Pulmonic valve regurgitation is trivial. No evidence of pulmonic stenosis. Aorta: The aortic root is normal in size and structure. Venous: The inferior vena cava is normal in size with greater than 50% respiratory variability, suggesting right atrial pressure of 3 mmHg. IAS/Shunts: No atrial level shunt detected by color flow  Doppler.  LEFT VENTRICLE PLAX 2D LVIDd:         5.20 cm   Diastology LVIDs:         3.10 cm   LV e' medial:    4.50 cm/s LV PW:         1.20 cm   LV E/e' medial:  12.0 LV IVS:        1.10 cm   LV e' lateral:   4.20 cm/s LVOT diam:     2.10 cm   LV E/e' lateral: 12.9 LV SV:         80 LV SV Index:   47 LVOT Area:     3.46 cm  RIGHT VENTRICLE             IVC RV S prime:     15.70 cm/s  IVC diam: 1.00 cm TAPSE (M-mode): 2.0 cm LEFT ATRIUM             Index        RIGHT ATRIUM           Index LA diam:        3.40 cm 2.02 cm/m   RA Area:     16.30 cm LA Vol (A2C):   79.2 ml 47.01 ml/m  RA Volume:   39.80 ml  23.62 ml/m LA Vol (A4C):   47.5 ml 28.20 ml/m LA Biplane Vol: 63.0 ml 37.40 ml/m  AORTIC VALVE LVOT Vmax:   102.00 cm/s LVOT Vmean:  67.600 cm/s LVOT VTI:    0.230 m AI PHT:      400 msec  AORTA Ao Root diam: 3.70 cm Ao Asc diam:  3.10 cm MITRAL VALVE MV Area (PHT): 4.60 cm    SHUNTS MV Decel Time: 165 msec    Systemic VTI:  0.23 m MR Peak grad: 134.6 mmHg   Systemic Diam: 2.10 cm MR Mean grad: 82.0 mmHg MR Vmax:      580.00 cm/s MR Vmean:     416.0 cm/s MV E velocity: 54.00 cm/s MV A velocity: 96.40 cm/s MV E/A ratio:  0.56 Kirk Ruths MD Electronically signed by Kirk Ruths MD Signature Date/Time: 12/30/2021/2:40:23 PM    Final    VAS US CAROTID  Result Date: 12/31/2021 Carotid  Arterial Duplex Study Patient Name:  Chad [name]  Date of Exam:   12/30/2021 Medical Rec #: [id number]          Accession #:    [id number] Date of Birth: 31-Mar-1932           Patient Gender: M Patient Age:   53 years Exam Location:  [facility] Procedure:      VAS US CAROTID Referring Phys: Nyoka Lint DOUTOVA --------------------------------------------------------------------------------  Indications:       CVA. Risk Factors:      Hypertension. Comparison Study:  No prior study Performing Technologist: Maudry Mayhew MHA, RDMS, RVT, RDCS  Examination Guidelines: A complete evaluation includes B-mode imaging, spectral Doppler, color Doppler, and power Doppler as needed of all accessible portions of each vessel. Bilateral testing is considered an integral part of a complete examination. Limited examinations for reoccurring indications may be performed as noted.  Right Carotid Findings: +----------+-------+--------+--------+-------------------------------+---------+           PSV    EDV cm/sStenosisPlaque Description             Comments            cm/s                                                            +----------+-------+--------+--------+-------------------------------+---------+  CCA Prox  54     7               heterogenous and irregular               +----------+-------+--------+--------+-------------------------------+---------+ CCA Distal58     9               smooth and heterogenous                  +----------+-------+--------+--------+-------------------------------+---------+ ICA Prox  132    19              smooth, heterogenous and       Shadowing                                  calcific                                 +----------+-------+--------+--------+-------------------------------+---------+ ICA Distal78     15                                                        +----------+-------+--------+--------+-------------------------------+---------+ ECA       126                                                             +----------+-------+--------+--------+-------------------------------+---------+ +----------+--------+-------+----------------+-------------------+           PSV cm/sEDV cmsDescribe        Arm Pressure (mmHG) +----------+--------+-------+----------------+-------------------+ [id number]            Multiphasic, WNL                    +----------+--------+-------+----------------+-------------------+ +---------+--------+--+--------+-+---------+ VertebralPSV cm/s51EDV cm/s5Antegrade +---------+--------+--+--------+-+---------+  Left Carotid Findings: +----------+--------+--------+--------+------------------------------+---------+           PSV cm/sEDV cm/sStenosisPlaque Description            Comments  +----------+--------+--------+--------+------------------------------+---------+ CCA Prox  89                      heterogenous and irregular    tortuous  +----------+--------+--------+--------+------------------------------+---------+ CCA Distal70      8                                                       +----------+--------+--------+--------+------------------------------+---------+ ICA Prox  49      12              heterogenous, irregular and   Shadowing                                   calcific                                +----------+--------+--------+--------+------------------------------+---------+  ICA Distal98      18                                                      +----------+--------+--------+--------+------------------------------+---------+ ECA       67      10                                                      +----------+--------+--------+--------+------------------------------+---------+ +----------+--------+--------+----------------+-------------------+            PSV cm/sEDV cm/sDescribe        Arm Pressure (mmHG) +----------+--------+--------+----------------+-------------------+ [id number]             Multiphasic, WNL                    +----------+--------+--------+----------------+-------------------+ +---------+--------+--+--------+--+---------+ VertebralPSV cm/s94EDV cm/s14Antegrade +---------+--------+--+--------+--+---------+   Summary: Right Carotid: Velocities in the right ICA are consistent with a 1-39% stenosis. Left Carotid: Velocities in the left ICA are consistent with a 1-39% stenosis. Vertebrals:  Bilateral vertebral arteries demonstrate antegrade flow. Subclavians: Normal flow hemodynamics were seen in bilateral subclavian              arteries. *See table(s) above for measurements and observations.  Electronically signed by Antony Contras MD on 12/31/2021 at 3:59:42 PM.    Final       Subjective: Patient seen and examined at the bedside this morning.  Medically stable for discharge today.  Discharge Exam: Vitals:   01/02/22 1130 01/02/22 1200  BP: (!) 156/66 (!) 149/69  Pulse:  88  Resp:  (!) 22  Temp:    SpO2:  100%   Vitals:   01/02/22 0930 01/02/22 1000 01/02/22 1130 01/02/22 1200  BP: (!) 134/59 134/81 (!) 156/66 (!) 149/69  Pulse: (!) 105 (!) 109  88  Resp: 19 (!) 22  (!) 22  Temp:      TempSrc:      SpO2: 100% 100%  100%  Weight:      Height:        General: Pt is alert, awake, not in acute distress Cardiovascular: RRR, S1/S2 +, no rubs, no gallops Respiratory: CTA bilaterally, no wheezing, no rhonchi Abdominal: Soft, NT, ND, bowel sounds + Extremities: no edema, no cyanosis, left hemiparesis    The results of significant diagnostics from this hospitalization (including imaging, microbiology, ancillary and laboratory) are listed below for reference.     Microbiology: Recent Results (from the past 240 hour(s))  MRSA Next Gen by PCR, Nasal     Status: None   Collection Time: 01/01/22  2:40 PM    Specimen: Nasal Mucosa; Nasal Swab  Result Value Ref Range Status   MRSA by PCR Next Gen NOT DETECTED NOT DETECTED Final    Comment: (NOTE) The GeneXpert MRSA Assay (FDA approved for NASAL specimens only), is one component of a comprehensive MRSA colonization surveillance program. It is not intended to diagnose MRSA infection nor to guide or monitor treatment for MRSA infections. Test performance is not FDA approved in patients less than 51 years old. Performed at [facility], [place] [address], [place], [place] [address]      Labs: BNP (last  3 results) No results for input(s): BNP in the last 8760 hours. Basic Metabolic Panel: Recent Labs  Lab 12/29/21 1700 12/29/21 1722 12/31/21 0357 01/02/22 0548  NA 140 141 145 147*  K 4.4 4.4 3.8 4.1  CL 108 109 114* 123*  CO2 22  --  22 17*  GLUCOSE 119* 118* 110* 123*  BUN 35* 36* 27* 25*  CREATININE 1.46* 1.40* 1.33* 1.45*  CALCIUM 9.9  --  8.9 8.1*  MG 1.9  --   --   --   PHOS 3.5  --   --   --    Liver Function Tests: Recent Labs  Lab 12/29/21 1700  AST 25  ALT 12  ALKPHOS 56  BILITOT 0.8  PROT 7.6  ALBUMIN 3.4*   No results for input(s): LIPASE, AMYLASE in the last 168 hours. No results for input(s): AMMONIA in the last 168 hours. CBC: Recent Labs  Lab 12/29/21 1700 12/29/21 1722 12/31/21 0357 01/02/22 0548  WBC 6.9  --  6.4 8.3  NEUTROABS 3.8  --   --  5.2  HGB 11.6* 11.6* 10.0* 8.2*  HCT 35.6* 34.0* 30.7* 24.7*  MCV 86.2  --  84.8 85.2  PLT 197  --  170 154   Cardiac Enzymes: Recent Labs  Lab 12/29/21 1700 12/30/21 0412  CKTOTAL 328 227   BNP: Invalid input(s): POCBNP CBG: Recent Labs  Lab 12/29/21 1658  GLUCAP 112*   D-Dimer No results for input(s): DDIMER in the last 72 hours. Hgb A1c No results for input(s): HGBA1C in the last 72 hours. Lipid Profile No results for input(s): CHOL, HDL, LDLCALC, TRIG, CHOLHDL, LDLDIRECT in the last 72 hours. Thyroid function studies No results  for input(s): TSH, T4TOTAL, T3FREE, THYROIDAB in the last 72 hours.  Invalid input(s): FREET3 Anemia work up No results for input(s): VITAMINB12, FOLATE, FERRITIN, TIBC, IRON, RETICCTPCT in the last 72 hours. Urinalysis    Component Value Date/Time   COLORURINE YELLOW 12/29/2021 2233   APPEARANCEUR CLEAR 12/29/2021 2233   LABSPEC 1.011 12/29/2021 2233   PHURINE 5.0 12/29/2021 2233   GLUCOSEU NEGATIVE 12/29/2021 2233   HGBUR SMALL (A) 12/29/2021 2233   BILIRUBINUR NEGATIVE 12/29/2021 2233   KETONESUR NEGATIVE 12/29/2021 2233   PROTEINUR 30 (A) 12/29/2021 2233   UROBILINOGEN 0.2 03/23/2013 0908   NITRITE NEGATIVE 12/29/2021 2233   LEUKOCYTESUR NEGATIVE 12/29/2021 2233   Sepsis Labs Invalid input(s): PROCALCITONIN,  WBC,  LACTICIDVEN Microbiology Recent Results (from the past 240 hour(s))  MRSA Next Gen by PCR, Nasal     Status: None   Collection Time: 01/01/22  2:40 PM   Specimen: Nasal Mucosa; Nasal Swab  Result Value Ref Range Status   MRSA by PCR Next Gen NOT DETECTED NOT DETECTED Final    Comment: (NOTE) The GeneXpert MRSA Assay (FDA approved for NASAL specimens only), is one component of a comprehensive MRSA colonization surveillance program. It is not intended to diagnose MRSA infection nor to guide or monitor treatment for MRSA infections. Test performance is not FDA approved in patients less than 32 years old. Performed at [facility], [place] [address] [place], [place] [address]     Please note: You were cared for by a hospitalist during your hospital stay. Once you are discharged, your primary care physician will handle any further medical issues. Please note that NO REFILLS for any discharge medications will be authorized once you are discharged, as it is imperative that you return to your primary care physician (  or establish a relationship with a primary care physician if you do not have one) for your post hospital discharge needs so that they can reassess  your need for medications and monitor your lab values.    Time coordinating discharge: 40 minutes  SIGNED:   Shelly Coss, MD  [facility] 01/02/2022, 1:39 PM Pager [phone]  If 7PM-7AM, please contact night-coverage [email or web address] Password [email or web address]

## 2022-01-02 NOTE — Progress Notes (Signed)
Modified Barium Swallow Progress Note  Patient Details  Name: Chad [name] MRN: [id number] Date of Birth: 22-Aug-1931  Today's Date: 01/02/2022  Modified Barium Swallow completed.  Full report located under Chart Review in the Imaging Section.  Brief recommendations include the following:  Clinical Impression  Pt presents with oropharyngeal dysphagia characterized by reduced labial seal, impaired mastication, a pharyngeal delay and reduction in bolus cohesion, posterior bolus propulsion, tongue base retraction, anterior laryngeal movement, and pharyngeal constriction. He demonstrated left-sided anterior spillage, lingual residue, difficulty with A-P transport of solids, premature spillage to the valleculae and pyriform sinuses, and posterior pharyngeal wall residue. A liquid bolus was necessary to facilitate mastication and A-P transport of a nutrigrain bar. Liquid washes reduced pharyngeal residue to a more functional level and a left head turn was effective in reducing pyriform sinus residue with thin liquids, but pt exhibited notable difficulty maintaining this position. Penetration (PAS 3, 5) and aspiration (PAS 7, 8) were noted with thin liquids. Larger amounts of aspirate did trigger a cough which was mostly effective, but smaller quantities inconsistently triggered throat clearing which was inadequate for expulsion of material. No functional benefit was noted with prompted coughing. A dysphagia 1 diet with nectar thick liquids is recommended at this time. SLP will follow for dysphagia treatment.   Swallow Evaluation Recommendations       SLP Diet Recommendations: Dysphagia 1 (Puree) solids;Nectar thick liquid   Liquid Administration via: Cup;Straw   Medication Administration: Whole meds with puree   Supervision: Staff to assist with self feeding   Compensations: Slow rate;Small sips/bites;Monitor for anterior loss;Follow solids with liquid   Postural Changes: Seated upright at 90  degrees       Other Recommendations: Order thickener from pharmacy  [place] I. Hardin Negus, [place], [place] Office number [phone] Pager [phone]  Horton Marshall 01/02/2022,12:11 PM

## 2022-01-02 NOTE — H&P (Signed)
Physical Medicine and Rehabilitation Admission H&P        Chief Complaint  Patient presents with   Fall   Stroke Symptoms  : HPI: Chad [name] is an 88 year old right-handed male with history of hypertension, CKD stage III, hyperlipidemia, mild aortic regurgitation, bilateral total knee arthroplasty, chronic pain using hydrocodone 7.5-325 mg 3 times daily as needed and BPH maintained on Flomax.  Per chart review patient lives alone independent with ADLs using a straight cane for mobility.  1 level apartment.  He does have family in the area.  Presented 12/29/2021 with acute onset of left-sided weakness resulting in a fall.  Denied loss of consciousness.  Cranial CT scan showed age-indeterminate lacunar infarction within the right basal ganglia.  Age-indeterminate small vessel ischemic changes throughout the periventricular white matter, favor chronic.  No acute hemorrhage.  CT cervical spine negative for fracture.  Extensive multilevel cervical degenerative changes.  MRI/MRA showed cluster of small cortical and white matter infarcts within the right MCA distribution.  MRI focal high-grade narrowing at the right M2 level where there was calcification by CT.  CTA of the neck apparent occlusion of proximal right M2 MCA branch in the region of calcification possibly calcified embolus or calcified plaque.  Extensive calcific atherosclerosis of the right carotid bifurcation with approximately 80% stenosis of the proximal ICA.  Potentially severe stenosis of the nondominant right vertebral artery origin.  Patient did not receive tPA.  Echocardiogram with ejection fraction of 55 to 60% no wall motion abnormalities.  Admission chemistries unremarkable except glucose 119 BUN 35 creatinine 1.46, CK within normal limits, troponin 76, hemoglobin A1c 6.1, lactic acid within normal limits.  Hospital course interventional radiology consulted for extensive calcified atherosclerosis of the right ICA bifurcation  with 80% stenosis status post right carotid angioplasty 5/31.  Placed on full dose aspirin and Brilinta therapy for CVA prophylaxis.  Plan is to follow-up outpatient interventional radiology 1 to 2 months if restenosis to consider right CEA.  Maintained on Lovenox for DVT prophylaxis.  Blood pressure monitored initially maintained on Cleviprex.  He is currently maintained on a dysphagia #1 pudding thick liquid diet advanced to nectar liquids 01/02/2022.  Therapy evaluations completed due to patient left-sided weakness and dysphagia was admitted for a comprehensive rehab program.     Pt reports is pooping, however no BM lately per pt and daughter.  Denies pain;  Is peeing OK.      Review of Systems  Constitutional:  Negative for chills and fever.  HENT:  Positive for hearing loss.   Eyes:  Negative for blurred vision and double vision.  Respiratory:  Negative for cough and shortness of breath.   Cardiovascular:  Negative for chest pain and palpitations.  Gastrointestinal:  Positive for constipation. Negative for heartburn and nausea.  Genitourinary:  Positive for urgency. Negative for dysuria, flank pain and hematuria.  Musculoskeletal:  Positive for joint pain and myalgias.  Skin:  Negative for rash.  Neurological:  Positive for weakness.  All other systems reviewed and are negative.     Past Medical History:  Diagnosis Date   Arthritis     Heart murmur      asa child    Hypertension     Stroke [facility])           Past Surgical History:  Procedure Laterality Date   EYE SURGERY Bilateral 2022   JOINT REPLACEMENT       TONSILLECTOMY       TOTAL  KNEE ARTHROPLASTY Right 03/30/2013    Procedure: RIGHT TOTAL KNEE ARTHROPLASTY;  Surgeon: Tobi Bastos, MD;  Location: [facility];  Service: Orthopedics;  Laterality: Right;   TOTAL KNEE ARTHROPLASTY Left 10/09/2020    Procedure: TOTAL KNEE ARTHROPLASTY;  Surgeon: Paralee Cancel, MD;  Location: [facility];  Service: Orthopedics;  Laterality: Left;  70  mins    History reviewed. No pertinent family history. Social History:  reports that he has never smoked. He has never used smokeless tobacco. He reports that he does not drink alcohol and does not use drugs. Allergies: No Known Allergies       Medications Prior to Admission  Medication Sig Dispense Refill   amLODipine (NORVASC) 5 MG tablet Take 5 mg by mouth daily.       atenolol (TENORMIN) 100 MG tablet Take 100 mg by mouth every morning.       HYDROcodone-acetaminophen (NORCO) 7.5-325 MG tablet Take 1 tablet by mouth 3 (three) times daily as needed for moderate pain.       lovastatin (MEVACOR) 40 MG tablet Take 40 mg by mouth at bedtime.       tamsulosin (FLOMAX) 0.4 MG CAPS capsule Take 0.4 mg by mouth daily.       docusate sodium (COLACE) 100 MG capsule Take 1 capsule (100 mg total) by mouth 2 (two) times daily. (Patient not taking: Reported on 12/29/2021) 10 capsule 0   ferrous sulfate 325 (65 FE) MG tablet Take 1 tablet (325 mg total) by mouth 2 (two) times daily with a meal for 14 days. 28 tablet 0   HYDROcodone-acetaminophen (NORCO/VICODIN) 5-325 MG tablet Take 1-2 tablets by mouth every 6 (six) hours as needed for moderate pain (pain score 4-6). (Patient not taking: Reported on 12/29/2021) 42 tablet 0   methocarbamol (ROBAXIN) 500 MG tablet Take 1 tablet (500 mg total) by mouth every 6 (six) hours as needed for muscle spasms. (Patient not taking: Reported on 12/29/2021) 30 tablet 0   polyethylene glycol (MIRALAX / GLYCOLAX) 17 g packet Take 17 g by mouth 2 (two) times daily. (Patient not taking: Reported on 12/29/2021) 14 each 0          Home: Home Living Family/patient expects to be discharged to:: Private residence Living Arrangements: Alone Available Help at Discharge: Family, Available 24 hours/day Type of Home: Apartment Home Access: Level entry Home Layout: One level Bathroom Shower/Tub: Chiropodist: Standard Home Equipment: Conservation officer, nature (2 wheels),  [employer] - single point, Geneticist, molecular, BSC/3in1  Lives With: Alone   Functional History: Prior Function Prior Level of Function : Independent/Modified Independent Mobility Comments: Uses SPC ADLs Comments: Pt performs ADLs and IADLs. Daughter drives. Unsure on medication management   Functional Status:  Mobility: Bed Mobility Overal bed mobility: Needs Assistance Bed Mobility: Rolling, Sidelying to Sit Rolling: Mod assist Sidelying to sit: Mod assist Supine to sit: Mod assist Sit to supine: Mod assist General bed mobility comments: with multimodal cues, pt needed mod assist to facilitate roll to R and truncal assist up from R UE, pt cued to asisst L LE with R LE. Transfers Overall transfer level: Needs assistance Equipment used: Rolling walker (2 wheels) Transfers: Sit to/from Stand Sit to Stand: Mod assist General transfer comment: worked on safe appropriate starting position, coming forward until far enough to start standing and boost to standing Ambulation/Gait General Gait Details: not able yet   ADL: ADL Overall ADL's : Needs assistance/impaired Eating/Feeding: Moderate assistance, Bed level Grooming: Moderate assistance, Bed  level Upper Body Bathing: Moderate assistance, Sitting Lower Body Bathing: Maximal assistance, Sit to/from stand Upper Body Dressing : Moderate assistance, Sitting Lower Body Dressing: Maximal assistance, Sit to/from stand Lower Body Dressing Details (indicate cue type and reason): donning socks at EOB Toilet Transfer: Moderate assistance Toilet Transfer Details (indicate cue type and reason): simulated with side steps towards HOB Functional mobility during ADLs: Moderate assistance (side steps towards HOB)   Cognition: Cognition Overall Cognitive Status: Impaired/Different from baseline Arousal/Alertness: Awake/alert Orientation Level: Oriented X4 Attention: Sustained, Selective Sustained Attention: Impaired Sustained Attention Impairment:  Verbal basic Selective Attention: Impaired Selective Attention Impairment: Verbal basic Memory: Impaired Memory Impairment: Retrieval deficit Awareness: Impaired Awareness Impairment: Emergent impairment Problem Solving: Impaired Cognition Arousal/Alertness: Awake/alert Behavior During Therapy: WFL for tasks assessed/performed Overall Cognitive Status: Impaired/Different from baseline Area of Impairment: Attention, Memory, Following commands, Safety/judgement, Awareness, Problem solving Current Attention Level: Sustained Memory: Decreased short-term memory Following Commands: Follows one step commands with increased time, Follows one step commands inconsistently Safety/Judgement: Decreased awareness of deficits, Decreased awareness of safety Awareness: Intellectual Problem Solving: Slow processing, Requires verbal cues, Requires tactile cues General Comments: Pt requiring increased time throughout session. Very pelasant and agreable to therapy. Decreased initation and benefiting from tactile cues. .   Physical Exam: Blood pressure 134/62, pulse 90, temperature 99 F (37.2 C), temperature source Oral, resp. rate 17, height 5\' 1"  (1.549 m), weight 62.2 kg, SpO2 100 %. Physical Exam Vitals and nursing note reviewed. Exam conducted with a chaperone present.  Constitutional:      Comments: Pt elderly, frail;  appears older than stated age; daughter at bedside; NAD  HENT:     Head: Normocephalic.     Comments: L facial droop- severe- not wearing dentures L tongue deviation    Right Ear: External ear normal.     Left Ear: External ear normal.     Nose: Nose normal. No congestion.     Mouth/Throat:     Mouth: Mucous membranes are dry.     Pharynx: Oropharynx is clear. No oropharyngeal exudate.  Eyes:     General:        Right eye: No discharge.        Left eye: No discharge.     Comments: R gaze preference cannot look past midline to left-  No nystagmus  Cardiovascular:     Rate  and Rhythm: Normal rate and regular rhythm.     Heart sounds: Normal heart sounds. No murmur heard.   No gallop.  Pulmonary:     Effort: Pulmonary effort is normal. No respiratory distress.     Breath sounds: Normal breath sounds. No wheezing, rhonchi or rales.  Abdominal:     Comments: Soft, NT; slightly distended; hypoactive  Musculoskeletal:     Cervical back: Neck supple. No tenderness.     Comments: LUE 0/5 LLE 0/5 RUE/RLE 5/5 in same muscles tested  Skin:    General: Skin is warm and dry.     Comments: R AC fossa IV-  has a little blood in line; L forearm IV- looks OK LUE significant pitting edema in hand/fingers and wrist/forearm and less so above elbow   Neurological:     Comments: Patient is awake alert sitting up in bed.  Makes eye contact with examiner.  He is somewhat hard of hearing.  Moderate dysarthria but intelligible.  Provides name and age.  Follows simple commands. L inattention Decreased to light touch in LUE/LLE and L face, but not absent Perseverative  on "you're here to help".  Pupils enlarged B/L  Psychiatric:     Comments: Flat, delayed responses      Lab Results Last 48 Hours        Results for orders placed or performed during the hospital encounter of 12/29/21 (from the past 48 hour(s))  POCT Activated clotting time     Status: None    Collection Time: 01/01/22 11:01 AM  Result Value Ref Range    Activated Clotting Time 203 seconds      Comment: Reference range 74-137 seconds for patients not on anticoagulant therapy.  MRSA Next Gen by PCR, Nasal     Status: None    Collection Time: 01/01/22  2:40 PM    Specimen: Nasal Mucosa; Nasal Swab  Result Value Ref Range    MRSA by PCR Next Gen NOT DETECTED NOT DETECTED      Comment: (NOTE) The GeneXpert MRSA Assay (FDA approved for NASAL specimens only), is one component of a comprehensive MRSA colonization surveillance program. It is not intended to diagnose MRSA infection nor to guide or monitor  treatment for MRSA infections. Test performance is not FDA approved in patients less than 8 years old. Performed at [facility], [place] [address], [place], [place] [address]    Heparin level (unfractionated)     Status: Abnormal    Collection Time: 01/01/22 10:36 PM  Result Value Ref Range    Heparin Unfractionated <0.10 (L) 0.30 - 0.70 IU/mL      Comment: (NOTE) The clinical reportable range upper limit is being lowered to >1.10 to align with the FDA approved guidance for the current laboratory assay.   If heparin results are below expected values, and patient dosage has  been confirmed, suggest follow up testing of antithrombin III levels. Performed at [facility], [place] [address] [place], [place] [address]        Imaging Results (Last 48 hours)  No results found.         Blood pressure 134/62, pulse 90, temperature 99 F (37.2 C), temperature source Oral, resp. rate 17, height 5\' 1"  (1.549 m), weight 62.2 kg, SpO2 100 %.   Medical Problem List and Plan: 1. Functional deficits secondary to right MCA scattered infarct due to right M2 occlusion, secondary to large vessel disease from high-grade right ICA stenosis status post right carotid angioplasty 5/31             -patient may  shower             -ELOS/Goals: 21- 24 days- min to mod A SLP and min A PT/OT 2.  Antithrombotics: -DVT/anticoagulation:  Pharmaceutical: Lovenox             -antiplatelet therapy: Aspirin 81 mg daily and Brilinta 90 mg twice daily 3. Pain Management: Hydrocodone as needed 4. Mood: Provide emotional support             -antipsychotic agents: N/A 5. Neuropsych: This patient is capable of making decisions on his own behalf. 6. Skin/Wound Care: Routine skin checks 7. Fluids/Electrolytes/Nutrition: Routine in and outs with follow-up chemistry 8.  Dysphagia.  Dysphagia #1 nectar thick liquid.  Monitor hydration.  Follow-up speech therapy 9.  Hypertension.  Norvasc 5 mg daily, Tenormin 100  mg daily.  Monitor with increased mobility 10.  Hyperlipidemia.  Crestor 11.  BPH.  Flomax 0.4 mg daily.  Check PVR 12.  CKD stage III.  Follow-up chemistries 13. Constipation- will give Sorbitol if  no BM 14, Dispo- family member can stay overnight     I have personally performed a face to face diagnostic evaluation of this patient and formulated the key components of the plan.  Additionally, I have personally reviewed laboratory data, imaging studies, as well as relevant notes and concur with the physician assistant's documentation above.   The patient's status has not changed from the original H&P.  Any changes in documentation from the acute care chart have been noted above.           Lavon Paganini Angiulli, PA-C 01/02/2022

## 2022-01-02 NOTE — Progress Notes (Signed)
Patient ID: Chad [name], male   DOB: 1932/03/05, 88 y.o.   MRN: [id number] Pt arrived to 4W03per bed. Daughter at bedside. Pt/family oriented to rehab and policies reviewed. Pt/family in agreement. Assessment completed, vitals obtained. No complications noted.  Call light in reach, bed alarm on. Reinforced safety educated with pt/family. Sheela Stack, LPN

## 2022-01-02 NOTE — Progress Notes (Signed)
Physical Therapy Treatment Patient Details Name: Chad [name] MRN: [id number] DOB: 09/25/1931 Today's Date: 01/02/2022   History of Present Illness 88 yo male admitted 5/28 with L sided weakness, facial droop, slurred speech, and fall. MPN:[id number] of small cortical and Chad [name] matter infarcts within the right MCA distribution. On 5/31, Status post balloon angioplasty of severe high-grade symptomatic stenosis of the proximal right ICA to approximately a 60% patency.PMHx: hypothyroidism, chronic pain, CKD, HTN, arthritis, bil TKA.    PT Comments    Pt admitted with above diagnosis. Pt was able to sit EOB for about 20 min working on sitting balance and midline orientation. Pt needs +1 min to mod assist to sit EOB.  Pt  needs max assist of 2 to stand with bil UE support.  Pt limited by significant left neglect.  Very little active movement noted in left hemibody (trace hip and shoulder only ).  Daughter in room and states that 24 hour care will be arranged. Will continue to follow pt.  Pt currently with functional limitations due to balance and endurance deficits. Pt will benefit from skilled PT to increase their independence and safety with mobility to allow discharge to the venue listed below.      Recommendations for follow up therapy are one component of a multi-disciplinary discharge planning process, led by the attending physician.  Recommendations may be updated based on patient status, additional functional criteria and insurance authorization.  Follow Up Recommendations  Acute inpatient rehab (3hours/day)     Assistance Recommended at Discharge Frequent or constant Supervision/Assistance  Patient can return home with the following A lot of help with walking and/or transfers;A lot of help with bathing/dressing/bathroom;Assistance with cooking/housework;Help with stairs or ramp for entrance;Assist for transportation   Equipment Recommendations  Other (comment)    Recommendations for Other  Services       Precautions / Restrictions Precautions Precautions: Fall Restrictions Weight Bearing Restrictions: No     Mobility  Bed Mobility Overal bed mobility: Needs Assistance Bed Mobility: Rolling, Sidelying to Sit Rolling: Mod assist Sidelying to sit: Mod assist, Max assist   Sit to supine: Max assist   General bed mobility comments: with multimodal cues, pt needed mod assist to facilitate roll to R and truncal assist up from R UE, pt cued to asisst L LE with R LE but difficulty doing so therefore needed assist with LEs out and into bed. Pt initiated movement to EOB with cues but could not assist with sliding left hip out and difficulty even with right hip.    Transfers Overall transfer level: Needs assistance Equipment used: 2 person hand held assist Transfers: Sit to/from Stand Sit to Stand: Total assist, Max assist, +2 physical assistance           General transfer comment: First stand attempt total assist of 2 with pt leaning quite a bit to right unless assisted.  Second attempt, Pt initiated stand therefore max assist of 2  but continues to have difficulty maintaining stand without constant cues and assist with use of pad to maintain hip and trunk extension.  Attempts at weight shifting with use of pad to faciliate. When pt asked to complete weight shift, he moved minimially at his trunk and hips only.    Ambulation/Gait               General Gait Details: not able yet   Chief Strategy Officer  Modified Rankin (Stroke Patients Only) Modified Rankin (Stroke Patients Only) Pre-Morbid Rankin Score: No symptoms Modified Rankin: Severe disability     Balance Overall balance assessment: Needs assistance Sitting-balance support: No upper extremity supported, Feet supported, right UE support Sitting balance-Leahy Scale: Fair Sitting balance - Comments: worked on moving in/out of BOS and re-finding midline and upright posture.   Pt leans to left and is unaware unless cued.  Pt cannot correct without assist.  Pt sits at times with min guard assist but most of the time needs min to mod assist due to loss of balance to his left.  Pts balance also worsens as he fatigues.   Standing balance support: Bilateral upper extremity supported, During functional activity, single UE support Standing balance-Leahy Scale: Poor Standing balance comment: Reliant on UE and external support. L lateral lean in standing.  2 trials of 1 min working on w/shifting with intent of trying to get pt to stand upright.    Pt not able to adequately stand without +2 support wtih forward flexed trunk, head and neck.  Did not get much activation of left hemibody.                            Cognition Arousal/Alertness: Awake/alert Behavior During Therapy: WFL for tasks assessed/performed Overall Cognitive Status: Impaired/Different from baseline Area of Impairment: Attention, Memory, Following commands, Safety/judgement, Awareness, Problem solving                   Current Attention Level: Sustained Memory: Decreased short-term memory Following Commands: Follows one step commands with increased time, Follows one step commands inconsistently Safety/Judgement: Decreased awareness of deficits, Decreased awareness of safety Awareness: Intellectual Problem Solving: Slow processing, Requires verbal cues, Requires tactile cues General Comments: Pt requiring increased time throughout session. Pt initially agreable to therapy. Stated he was irritated at his deficits toward end of session. Decreased initation and benefiting from verbal and tactile cues. Pt with signigicant left neglect and left field cut likely.  Difficult to get pt to look beyond midline to his left with right gaze preference.        Exercises      General Comments General comments (skin integrity, edema, etc.): Daughter present and states that they are arranging 24 hour  care at home      Pertinent Vitals/Pain      Home Living                          Prior Function            PT Goals (current goals can now be found in the care plan section) Acute Rehab PT Goals Patient Stated Goal: to get stronger Progress towards PT goals: Progressing toward goals    Frequency    Min 4X/week      PT Plan Current plan remains appropriate    Co-evaluation              AM-PAC PT "6 Clicks" Mobility   Outcome Measure  Help needed turning from your back to your side while in a flat bed without using bedrails?: A Little Help needed moving from lying on your back to sitting on the side of a flat bed without using bedrails?: A Lot Help needed moving to and from a bed to a chair (including a wheelchair)?: Total Help needed standing up from a chair using your arms (e.g., wheelchair or bedside  chair)?: Total Help needed to walk in hospital room?: Total Help needed climbing 3-5 steps with a railing? : Total 6 Click Score: 9    End of Session Equipment Utilized During Treatment: Gait belt Activity Tolerance: Patient limited by fatigue Patient left: with call bell/phone within reach;with family/visitor present;in bed;with bed alarm set Nurse Communication: Mobility status PT Visit Diagnosis: Other abnormalities of gait and mobility (R26.89);Unsteadiness on feet (R26.81);Muscle weakness (generalized) (M62.81);Other symptoms and signs involving the nervous system (R29.898)     Time: 6579-0383 PT Time Calculation (min) (ACUTE ONLY): 42 min  Charges:  $Therapeutic Activity: 8-22 mins $Neuromuscular Re-education: 8-22 mins                     Karl Knarr M,PT Acute Rehab Services [phone] (pager)    Alvira Philips 01/02/2022, 11:26 AM

## 2022-01-02 NOTE — H&P (Signed)
Physical Medicine and Rehabilitation Admission H&P    Chief Complaint  Patient presents with   Fall   Stroke Symptoms  : HPI: Chad [name]. Koller is an 88 year old right-handed male with history of hypertension, CKD stage III, hyperlipidemia, mild aortic regurgitation, bilateral total knee arthroplasty, chronic pain using hydrocodone 7.5-325 mg 3 times daily as needed and BPH maintained on Flomax.  Per chart review patient lives alone independent with ADLs using a straight cane for mobility.  1 level apartment.  He does have family in the area.  Presented 12/29/2021 with acute onset of left-sided weakness resulting in a fall.  Denied loss of consciousness.  Cranial CT scan showed age-indeterminate lacunar infarction within the right basal ganglia.  Age-indeterminate small vessel ischemic changes throughout the periventricular white matter, favor chronic.  No acute hemorrhage.  CT cervical spine negative for fracture.  Extensive multilevel cervical degenerative changes.  MRI/MRA showed cluster of small cortical and white matter infarcts within the right MCA distribution.  MRI focal high-grade narrowing at the right M2 level where there was calcification by CT.  CTA of the neck apparent occlusion of proximal right M2 MCA branch in the region of calcification possibly calcified embolus or calcified plaque.  Extensive calcific atherosclerosis of the right carotid bifurcation with approximately 80% stenosis of the proximal ICA.  Potentially severe stenosis of the nondominant right vertebral artery origin.  Patient did not receive tPA.  Echocardiogram with ejection fraction of 55 to 60% no wall motion abnormalities.  Admission chemistries unremarkable except glucose 119 BUN 35 creatinine 1.46, CK within normal limits, troponin 76, hemoglobin A1c 6.1, lactic acid within normal limits.  Hospital course interventional radiology consulted for extensive calcified atherosclerosis of the right ICA bifurcation with  80% stenosis status post right carotid angioplasty 5/31.  Placed on full dose aspirin and Brilinta therapy for CVA prophylaxis.  Plan is to follow-up outpatient interventional radiology 1 to 2 months if restenosis to consider right CEA.  Maintained on Lovenox for DVT prophylaxis.  Blood pressure monitored initially maintained on Cleviprex.  He is currently maintained on a dysphagia #1 pudding thick liquid diet advanced to nectar liquids 01/02/2022.  Therapy evaluations completed due to patient left-sided weakness and dysphagia was admitted for a comprehensive rehab program.   Pt reports is pooping, however no BM lately per pt and daughter.  Denies pain;  Is peeing OK.    Review of Systems  Constitutional:  Negative for chills and fever.  HENT:  Positive for hearing loss.   Eyes:  Negative for blurred vision and double vision.  Respiratory:  Negative for cough and shortness of breath.   Cardiovascular:  Negative for chest pain and palpitations.  Gastrointestinal:  Positive for constipation. Negative for heartburn and nausea.  Genitourinary:  Positive for urgency. Negative for dysuria, flank pain and hematuria.  Musculoskeletal:  Positive for joint pain and myalgias.  Skin:  Negative for rash.  Neurological:  Positive for weakness.  All other systems reviewed and are negative. Past Medical History:  Diagnosis Date   Arthritis    Heart murmur    asa child    Hypertension    Stroke [facility])    Past Surgical History:  Procedure Laterality Date   EYE SURGERY Bilateral 2022   JOINT REPLACEMENT     TONSILLECTOMY     TOTAL KNEE ARTHROPLASTY Right 03/30/2013   Procedure: RIGHT TOTAL KNEE ARTHROPLASTY;  Surgeon: Tobi Bastos, MD;  Location: [facility];  Service: Orthopedics;  Laterality: Right;  TOTAL KNEE ARTHROPLASTY Left 10/09/2020   Procedure: TOTAL KNEE ARTHROPLASTY;  Surgeon: Paralee Cancel, MD;  Location: [facility];  Service: Orthopedics;  Laterality: Left;  70 mins   History reviewed. No  pertinent family history. Social History:  reports that he has never smoked. He has never used smokeless tobacco. He reports that he does not drink alcohol and does not use drugs. Allergies: No Known Allergies Medications Prior to Admission  Medication Sig Dispense Refill   amLODipine (NORVASC) 5 MG tablet Take 5 mg by mouth daily.     atenolol (TENORMIN) 100 MG tablet Take 100 mg by mouth every morning.     HYDROcodone-acetaminophen (NORCO) 7.5-325 MG tablet Take 1 tablet by mouth 3 (three) times daily as needed for moderate pain.     lovastatin (MEVACOR) 40 MG tablet Take 40 mg by mouth at bedtime.     tamsulosin (FLOMAX) 0.4 MG CAPS capsule Take 0.4 mg by mouth daily.     docusate sodium (COLACE) 100 MG capsule Take 1 capsule (100 mg total) by mouth 2 (two) times daily. (Patient not taking: Reported on 12/29/2021) 10 capsule 0   ferrous sulfate 325 (65 FE) MG tablet Take 1 tablet (325 mg total) by mouth 2 (two) times daily with a meal for 14 days. 28 tablet 0   HYDROcodone-acetaminophen (NORCO/VICODIN) 5-325 MG tablet Take 1-2 tablets by mouth every 6 (six) hours as needed for moderate pain (pain score 4-6). (Patient not taking: Reported on 12/29/2021) 42 tablet 0   methocarbamol (ROBAXIN) 500 MG tablet Take 1 tablet (500 mg total) by mouth every 6 (six) hours as needed for muscle spasms. (Patient not taking: Reported on 12/29/2021) 30 tablet 0   polyethylene glycol (MIRALAX / GLYCOLAX) 17 g packet Take 17 g by mouth 2 (two) times daily. (Patient not taking: Reported on 12/29/2021) 14 each 0      Home: Home Living Family/patient expects to be discharged to:: Private residence Living Arrangements: Alone Available Help at Discharge: Family, Available 24 hours/day Type of Home: Apartment Home Access: Level entry Home Layout: One level Bathroom Shower/Tub: Chiropodist: Standard Home Equipment: Conservation officer, nature (2 wheels), [employer] - single point, Geneticist, molecular, BSC/3in1  Lives  With: Alone   Functional History: Prior Function Prior Level of Function : Independent/Modified Independent Mobility Comments: Uses SPC ADLs Comments: Pt performs ADLs and IADLs. Daughter drives. Unsure on medication management  Functional Status:  Mobility: Bed Mobility Overal bed mobility: Needs Assistance Bed Mobility: Rolling, Sidelying to Sit Rolling: Mod assist Sidelying to sit: Mod assist Supine to sit: Mod assist Sit to supine: Mod assist General bed mobility comments: with multimodal cues, pt needed mod assist to facilitate roll to R and truncal assist up from R UE, pt cued to asisst L LE with R LE. Transfers Overall transfer level: Needs assistance Equipment used: Rolling walker (2 wheels) Transfers: Sit to/from Stand Sit to Stand: Mod assist General transfer comment: worked on safe appropriate starting position, coming forward until far enough to start standing and boost to standing Ambulation/Gait General Gait Details: not able yet    ADL: ADL Overall ADL's : Needs assistance/impaired Eating/Feeding: Moderate assistance, Bed level Grooming: Moderate assistance, Bed level Upper Body Bathing: Moderate assistance, Sitting Lower Body Bathing: Maximal assistance, Sit to/from stand Upper Body Dressing : Moderate assistance, Sitting Lower Body Dressing: Maximal assistance, Sit to/from stand Lower Body Dressing Details (indicate cue type and reason): donning socks at EOB Toilet Transfer: Moderate assistance Toilet Transfer Details (indicate cue  type and reason): simulated with side steps towards [facility] Functional mobility during ADLs: Moderate assistance (side steps towards HOB)  Cognition: Cognition Overall Cognitive Status: Impaired/Different from baseline Arousal/Alertness: Awake/alert Orientation Level: Oriented X4 Attention: Sustained, Selective Sustained Attention: Impaired Sustained Attention Impairment: Verbal basic Selective Attention: Impaired Selective  Attention Impairment: Verbal basic Memory: Impaired Memory Impairment: Retrieval deficit Awareness: Impaired Awareness Impairment: Emergent impairment Problem Solving: Impaired Cognition Arousal/Alertness: Awake/alert Behavior During Therapy: WFL for tasks assessed/performed Overall Cognitive Status: Impaired/Different from baseline Area of Impairment: Attention, Memory, Following commands, Safety/judgement, Awareness, Problem solving Current Attention Level: Sustained Memory: Decreased short-term memory Following Commands: Follows one step commands with increased time, Follows one step commands inconsistently Safety/Judgement: Decreased awareness of deficits, Decreased awareness of safety Awareness: Intellectual Problem Solving: Slow processing, Requires verbal cues, Requires tactile cues General Comments: Pt requiring increased time throughout session. Very pelasant and agreable to therapy. Decreased initation and benefiting from tactile cues. .  Physical Exam: Blood pressure 134/62, pulse 90, temperature 99 F (37.2 C), temperature source Oral, resp. rate 17, height 5\' 1"  (1.549 m), weight 62.2 kg, SpO2 100 %. Physical Exam Vitals and nursing note reviewed. Exam conducted with a chaperone present.  Constitutional:      Comments: Pt elderly, frail;  appears older than stated age; daughter at bedside; NAD  HENT:     Head: Normocephalic.     Comments: L facial droop- severe- not wearing dentures L tongue deviation    Right Ear: External ear normal.     Left Ear: External ear normal.     Nose: Nose normal. No congestion.     Mouth/Throat:     Mouth: Mucous membranes are dry.     Pharynx: Oropharynx is clear. No oropharyngeal exudate.  Eyes:     General:        Right eye: No discharge.        Left eye: No discharge.     Comments: R gaze preference cannot look past midline to left-  No nystagmus  Cardiovascular:     Rate and Rhythm: Normal rate and regular rhythm.     Heart  sounds: Normal heart sounds. No murmur heard.   No gallop.  Pulmonary:     Effort: Pulmonary effort is normal. No respiratory distress.     Breath sounds: Normal breath sounds. No wheezing, rhonchi or rales.  Abdominal:     Comments: Soft, NT; slightly distended; hypoactive  Musculoskeletal:     Cervical back: Neck supple. No tenderness.     Comments: LUE 0/5 LLE 0/5 RUE/RLE 5/5 in same muscles tested  Skin:    General: Skin is warm and dry.     Comments: R AC fossa IV-  has a little blood in line; L forearm IV- looks OK LUE significant pitting edema in hand/fingers and wrist/forearm and less so above elbow   Neurological:     Comments: Patient is awake alert sitting up in bed.  Makes eye contact with examiner.  He is somewhat hard of hearing.  Moderate dysarthria but intelligible.  Provides name and age.  Follows simple commands. L inattention Decreased to light touch in LUE/LLE and L face, but not absent Perseverative on "you're here to help".  Pupils enlarged B/L  Psychiatric:     Comments: Flat, delayed responses    Results for orders placed or performed during the hospital encounter of 12/29/21 (from the past 48 hour(s))  POCT Activated clotting time     Status: None   Collection Time:  01/01/22 11:01 AM  Result Value Ref Range   Activated Clotting Time 203 seconds    Comment: Reference range 74-137 seconds for patients not on anticoagulant therapy.  MRSA Next Gen by PCR, Nasal     Status: None   Collection Time: 01/01/22  2:40 PM   Specimen: Nasal Mucosa; Nasal Swab  Result Value Ref Range   MRSA by PCR Next Gen NOT DETECTED NOT DETECTED    Comment: (NOTE) The GeneXpert MRSA Assay (FDA approved for NASAL specimens only), is one component of a comprehensive MRSA colonization surveillance program. It is not intended to diagnose MRSA infection nor to guide or monitor treatment for MRSA infections. Test performance is not FDA approved in patients less than 38  years old. Performed at [facility], [place] [address] [place], [place] [address]   Heparin level (unfractionated)     Status: Abnormal   Collection Time: 01/01/22 10:36 PM  Result Value Ref Range   Heparin Unfractionated <0.10 (L) 0.30 - 0.70 IU/mL    Comment: (NOTE) The clinical reportable range upper limit is being lowered to >1.10 to align with the FDA approved guidance for the current laboratory assay.  If heparin results are below expected values, and patient dosage has  been confirmed, suggest follow up testing of antithrombin III levels. Performed at [facility], [place] [address] [place], [place] [address]    No results found.    Blood pressure 134/62, pulse 90, temperature 99 F (37.2 C), temperature source Oral, resp. rate 17, height 5\' 1"  (1.549 m), weight 62.2 kg, SpO2 100 %.  Medical Problem List and Plan: 1. Functional deficits secondary to right MCA scattered infarct due to right M2 occlusion, secondary to large vessel disease from high-grade right ICA stenosis status post right carotid angioplasty 5/31  -patient may  shower  -ELOS/Goals: 21- 24 days- min to mod A SLP and min A PT/OT 2.  Antithrombotics: -DVT/anticoagulation:  Pharmaceutical: Lovenox  -antiplatelet therapy: Aspirin 81 mg daily and Brilinta 90 mg twice daily 3. Pain Management: Hydrocodone as needed 4. Mood: Provide emotional support  -antipsychotic agents: N/A 5. Neuropsych: This patient is capable of making decisions on his own behalf. 6. Skin/Wound Care: Routine skin checks 7. Fluids/Electrolytes/Nutrition: Routine in and outs with follow-up chemistry 8.  Dysphagia.  Dysphagia #1 nectar thick liquid.  Monitor hydration.  Follow-up speech therapy 9.  Hypertension.  Norvasc 5 mg daily, Tenormin 100 mg daily.  Monitor with increased mobility 10.  Hyperlipidemia.  Crestor 11.  BPH.  Flomax 0.4 mg daily.  Check PVR 12.  CKD stage III.  Follow-up chemistries 13. Constipation- will  give Sorbitol if no BM 14, Dispo- family member can stay overnight   I have personally performed a face to face diagnostic evaluation of this patient and formulated the key components of the plan.  Additionally, I have personally reviewed laboratory data, imaging studies, as well as relevant notes and concur with the physician assistant's documentation above.   The patient's status has not changed from the original H&P.  Any changes in documentation from the acute care chart have been noted above.       Lavon Paganini Angiulli, PA-C 01/02/2022

## 2022-01-02 NOTE — Progress Notes (Signed)
Occupational Therapy Treatment Patient Details Name: Chad [name] MRN: [id number] DOB: [birth year] Today's Date: 01/02/2022   History of present illness 88 yo male admitted 5/28 with L sided weakness, facial droop, slurred speech, and fall. QIW:[id number] of small cortical and white matter infarcts within the right MCA distribution. On 5/31, Status post balloon angioplasty of severe high-grade symptomatic stenosis of the proximal right ICA to approximately a 60% patency.PMHx: hypothyroidism, chronic pain, CKD, HTN, arthritis, bil TKA.   OT comments  Pt with apparent decline in functional status as compared to initial eval after speaking with evaluating therapist. Pt requires Max A +2 with limited mobility and max to total A with ADL tasks. Pt is a good rehab candidate however at this time most likely will DC home needing 24/7 physical assistance for all aspects of ADL and mobility. VSS during session. Will continue to follow.    Recommendations for follow up therapy are one component of a multi-disciplinary discharge planning process, led by the attending physician.  Recommendations may be updated based on patient status, additional functional criteria and insurance authorization.    Follow Up Recommendations  Acute inpatient rehab (3hours/day)    Assistance Recommended at Discharge Frequent or constant Supervision/Assistance  Patient can return home with the following  Two people to help with bathing/dressing/bathroom;Two people to help with walking and/or transfers;Direct supervision/assist for medications management;Direct supervision/assist for financial management;Assist for transportation;Assistance with cooking/housework   Equipment Recommendations  None recommended by OT    Recommendations for Other Services      Precautions / Restrictions Precautions Precautions: Fall Precaution Comments: L neglect Restrictions Weight Bearing Restrictions: No       Mobility Bed  Mobility Overal bed mobility: Needs Assistance Bed Mobility: Rolling, Sidelying to Sit Rolling: Mod assist Sidelying to sit: Mod assist, Max assist   Sit to supine: Max assist   General bed mobility comments: with multimodal cues, pt needed mod assist to facilitate roll to R and truncal assist up from R UE, pt cued to asisst L LE with R LE but difficulty doing so therefore needed assist with LEs out and into bed. Pt initiated movement to EOB with cues but could not assist with sliding left hip out and difficulty even with right hip.    Transfers Overall transfer level: Needs assistance Equipment used: 2 person hand held assist Transfers: Sit to/from Stand Sit to Stand: Max assist, +2 physical assistance           General transfer comment: First stand attempt total assist of 2 with pt leaning quite a bit to right unless assisted.  Second attempt, Pt initiated stand therefore max assist of 2  but continues to have difficulty maintaining stand without constant cues and assist with use of pad to maintain hip and trunk extension.  Attempts at weight shifting with use of pad to faciliate. When pt asked to complete weight shift, he moved minimially at his trunk and hips only.     Balance Overall balance assessment: Needs assistance Sitting-balance support: No upper extremity supported, Feet supported Sitting balance-Leahy Scale: Fair Sitting balance - Comments: worked on moving in/out of BOS and re-finding midline and upright posture.  Pt leans to left and is unaware unless cued.  Pt cannot correct without assist.  Pt sits at times with min guard assist but most of the time needs min to mod assist due to loss of balance to his left.  Pts balance also worsens as he fatigues.   Standing balance support: Bilateral  upper extremity supported, During functional activity Standing balance-Leahy Scale: Poor Standing balance comment: Reliant on UE and external support. L lateral lean in standing.  2  trials of 1 min working on w/shifting with intent of trying to get pt to stand upright.    Pt not able to adequately stand without +2 support wtih forward flexed trunk, head and neck.  Did not get much activation of left hemibody.                           ADL either performed or assessed with clinical judgement   ADL Overall ADL's : Needs assistance/impaired Eating/Feeding: Maximal assistance   Grooming: Maximal assistance       Lower Body Bathing: Maximal assistance   Upper Body Dressing : Maximal assistance   Lower Body Dressing: Maximal assistance   Toilet Transfer: Maximal assistance;+2 for physical assistance   Toileting- Clothing Manipulation and Hygiene: Total assistance       Functional mobility during ADLs: Maximal assistance;+2 for physical assistance      Extremity/Trunk Assessment Upper Extremity Assessment Upper Extremity Assessment: LUE deficits/detail LUE Deficits / Details: increased  tone noted; pt moving spontaneously at times however unaware of movement LUE Sensation: decreased light touch;decreased proprioception   Lower Extremity Assessment Lower Extremity Assessment: Defer to PT evaluation        Vision   Eye Alignment: Within Functional Limits Additional Comments: R gaze preferecne; able to track to midlein; poor visual attention   Perception Perception Perception: Impaired (L neglect)   Praxis Praxis Praxis: Impaired    Cognition Arousal/Alertness: Awake/alert Behavior During Therapy: Impulsive, Restless Overall Cognitive Status: Impaired/Different from baseline Area of Impairment: Attention, Memory, Following commands, Safety/judgement, Awareness, Problem solving                   Current Attention Level: Sustained Memory: Decreased short-term memory Following Commands: Follows one step commands with increased time, Follows one step commands inconsistently Safety/Judgement: Decreased awareness of deficits,  Decreased awareness of safety Awareness: Intellectual Problem Solving: Slow processing, Requires verbal cues, Requires tactile cues, Decreased initiation, Difficulty sequencing General Comments: Pt requiring increased time throughout session. Pt initially agreable to therapy. Stated he was irritated at his deficits toward end of session. Decreased initation and benefiting from verbal and tactile cues. Pt with signigicant left neglect and left field cut likely.  Difficult to get pt to look beyond midline to his left with right gaze preference.        Exercises      Shoulder Instructions       General Comments daughter present for session - educated on L neglect and need to sit toward his midline for therapeutic reasons    Pertinent Vitals/ Pain       Pain Assessment Pain Assessment: No/denies pain  Home Living                                          Prior Functioning/Environment              Frequency  Min 2X/week        Progress Toward Goals  OT Goals(current goals can now be found in the care plan section)  Progress towards OT goals: OT to reassess next treatment (worse performance this date)  Acute Rehab OT Goals Patient Stated Goal: Go home OT Goal Formulation: With  patient/family Time For Goal Achievement: 01/13/22 Potential to Achieve Goals: Good ADL Goals Pt Will Perform Grooming: with set-up;with supervision;sitting Pt Will Perform Upper Body Dressing: with min assist;sitting Pt Will Perform Lower Body Dressing: sit to/from stand;with min assist Pt Will Transfer to Toilet: with min guard assist;ambulating;bedside commode Pt Will Perform Toileting - Clothing Manipulation and hygiene: with supervision;sitting/lateral leans;sit to/from stand  Plan Discharge plan remains appropriate    Co-evaluation    PT/OT/SLP Co-Evaluation/Treatment: Yes Reason for Co-Treatment: Complexity of the patient's impairments (multi-system involvement);To  address functional/ADL transfers;For patient/therapist safety   OT goals addressed during session: ADL's and self-care      AM-PAC OT "6 Clicks" Daily Activity     Outcome Measure   Help from another person eating meals?: A Lot Help from another person taking care of personal grooming?: A Lot Help from another person toileting, which includes using toliet, bedpan, or urinal?: Total Help from another person bathing (including washing, rinsing, drying)?: A Lot Help from another person to put on and taking off regular upper body clothing?: A Lot Help from another person to put on and taking off regular lower body clothing?: A Lot 6 Click Score: 11    End of Session Equipment Utilized During Treatment: Gait belt  OT Visit Diagnosis: Unsteadiness on feet (R26.81);Other abnormalities of gait and mobility (R26.89);Muscle weakness (generalized) (M62.81)   Activity Tolerance Patient tolerated treatment well   Patient Left in bed;with call bell/phone within reach;with family/visitor present   Nurse Communication Mobility status        Time: 9924-2683 OT Time Calculation (min): 29 min  Charges: OT General Charges $OT Visit: 1 Visit OT Treatments $Self Care/Home Management : 8-22 mins  Maurie Boettcher, OT/L   Acute OT Clinical Specialist [place] Pager [phone] Office [phone]   [facility] 01/02/2022, 1:00 PM

## 2022-01-02 NOTE — Progress Notes (Signed)
STROKE TEAM PROGRESS NOTE   SUBJECTIVE (INTERVAL HISTORY) His daughter is at the bedside. Pt lying in bed, awake alert, still has dysarthria but improving.  He stated that he felt "perfect".  Has a swallow on dysphagia 1 and nectar thick liquid.  Pending CIR.   OBJECTIVE Temp:  [98.2 F (36.8 C)-99 F (37.2 C)] 98.3 F (36.8 C) (06/01 1541) Pulse Rate:  [75-112] 95 (06/01 1541) Cardiac Rhythm: Normal sinus rhythm (06/01 0800) Resp:  [14-29] 17 (06/01 1541) BP: (108-166)/(57-93) 150/81 (06/01 1541) SpO2:  [96 %-100 %] 100 % (06/01 1541) Arterial Line BP: (139-158)/(48-66) 147/50 (06/01 0900) Weight:  [61.3 kg] 61.3 kg (06/01 1541)  Recent Labs  Lab 12/29/21 1658  GLUCAP 112*   Recent Labs  Lab 12/29/21 1700 12/29/21 1722 12/31/21 0357 01/02/22 0548  NA 140 141 145 147*  K 4.4 4.4 3.8 4.1  CL 108 109 114* 123*  CO2 22  --  22 17*  GLUCOSE 119* 118* 110* 123*  BUN 35* 36* 27* 25*  CREATININE 1.46* 1.40* 1.33* 1.45*  CALCIUM 9.9  --  8.9 8.1*  MG 1.9  --   --   --   PHOS 3.5  --   --   --    Recent Labs  Lab 12/29/21 1700  AST 25  ALT 12  ALKPHOS 56  BILITOT 0.8  PROT 7.6  ALBUMIN 3.4*   Recent Labs  Lab 12/29/21 1700 12/29/21 1722 12/31/21 0357 01/02/22 0548  WBC 6.9  --  6.4 8.3  NEUTROABS 3.8  --   --  5.2  HGB 11.6* 11.6* 10.0* 8.2*  HCT 35.6* 34.0* 30.7* 24.7*  MCV 86.2  --  84.8 85.2  PLT 197  --  170 154   Recent Labs  Lab 12/29/21 1700 12/30/21 0412  CKTOTAL 328 227   No results for input(s): LABPROT, INR in the last 72 hours.  No results for input(s): COLORURINE, LABSPEC, [place], GLUCOSEU, HGBUR, BILIRUBINUR, KETONESUR, PROTEINUR, UROBILINOGEN, NITRITE, LEUKOCYTESUR in the last 72 hours.  Invalid input(s): APPERANCEUR      Component Value Date/Time   CHOL 149 12/30/2021 0412   TRIG 68 12/30/2021 0412   HDL 45 12/30/2021 0412   CHOLHDL 3.3 12/30/2021 0412   VLDL 14 12/30/2021 0412   LDLCALC 90 12/30/2021 0412   Lab Results   Component Value Date   HGBA1C 6.1 (H) 12/29/2021   No results found for: LABOPIA, COCAINSCRNUR, LABBENZ, AMPHETMU, THCU, LABBARB  No results for input(s): ETH in the last 168 hours.  I have personally reviewed the radiological images below and agree with the radiology interpretations.  CT ANGIO HEAD NECK W WO CM  Result Date: 12/30/2021 CLINICAL DATA:  Stroke/TIA, determine embolic source EXAM: CT ANGIOGRAPHY HEAD AND NECK TECHNIQUE: Multidetector CT imaging of the head and neck was performed using the standard protocol during bolus administration of intravenous contrast. Multiplanar CT image reconstructions and MIPs were obtained to evaluate the vascular anatomy. Carotid stenosis measurements (when applicable) are obtained utilizing NASCET criteria, using the distal internal carotid diameter as the denominator. RADIATION DOSE REDUCTION: This exam was performed according to the departmental dose-optimization program which includes automated exposure control, adjustment of the mA and/or kV according to patient size and/or use of iterative reconstruction technique. CONTRAST:  69mL OMNIPAQUE IOHEXOL 350 MG/ML SOLN COMPARISON:  Same day MRI/MRA. FINDINGS: CT HEAD FINDINGS Brain: Small right MCA territory infarcts better characterized on same day MRI. No evidence of acute hemorrhage, mass effect, midline shift, extra-axial  fluid collection, or hydrocephalus. Patchy white matter hypodensities, nonspecific but compatible with chronic microvascular ischemic disease. Vascular: Detailed below. Skull: No acute fracture. Sinuses: Mild paranasal sinus mucosal thickening. Orbits: No acute orbital findings. Review of the MIP images confirms the above findings CTA NECK FINDINGS Aortic arch: Calcific atherosclerosis of the aorta and great vessel origins. Ectatic right subclavian artery. Right carotid system: Atherosclerosis at the carotid bifurcation with approximately 80% stenosis. Left carotid system: Atherosclerosis  at the carotid bifurcation without greater than 50% stenosis. Vertebral arteries: Potentially severe stenosis of the right vertebral artery origin due to calcific atherosclerosis. Otherwise, vertebral arteries are patent without significant (greater than 50%) stenosis. Left dominant. Skeleton: Severe multilevel degenerative change. Other neck: No acute findings. Upper chest: Clear sinuses. Review of the MIP images confirms the above findings CTA HEAD FINDINGS Anterior circulation: Bilateral intracranial ICAs are patent with mild narrowing due to calcific atherosclerosis. Bilateral M1 MCAs and left M2 MCAs are patent. Apparent occlusion of a right M2 MCA branch in a region of calcification. Bilateral ACAs are patent. Posterior circulation: Left dominant intradural vertebral artery. Right intradural vertebral artery makes very small contribution to the basilar artery. Basilar artery and bilateral posterior cerebral arteries are patent without proximal hemodynamically significant stenosis. Venous sinuses: As permitted by contrast timing, patent. Anatomic variants: Detailed above. Review of the MIP images confirms the above findings IMPRESSION: CTA: 1. 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 2. Extensive calcific atherosclerosis at the right carotid bifurcation with approximately 80% stenosis of the proximal ICA. 3. 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 CT head: Small right MCA territory infarcts better characterized on same day MRI. No evidence of progressive mass effect or acute hemorrhage. Findings discussed with Dr. Erlinda Hong via telephone at 12:50 p.m. Electronically Signed   By: Margaretha Sheffield M.D.   On: 12/30/2021 13:00   DG Elbow Complete Left  Result Date: 12/29/2021 CLINICAL DATA:  Fall.  Left elbow pain  and bruising. EXAM: LEFT ELBOW - COMPLETE 3+ VIEW COMPARISON:  None Available. FINDINGS: No fracture or dislocation. Narrowed radiocapitellar joint. Marginal osteophytes project from the base of the radial head. No joint effusion. There is soft tissue edema posteriorly. IMPRESSION: No fracture or dislocation. Electronically Signed   By: Lajean Manes M.D.   On: 12/29/2021 18:12   CT HEAD WO CONTRAST  Result Date: 12/29/2021 CLINICAL DATA:  Neurologic deficit EXAM: CT HEAD WITHOUT CONTRAST TECHNIQUE: Contiguous axial images were obtained from the base of the skull through the vertex without intravenous contrast. RADIATION DOSE REDUCTION: This exam was performed according to the departmental dose-optimization program which includes automated exposure control, adjustment of the mA and/or kV according to patient size and/or use of iterative reconstruction technique. COMPARISON:  None Available. FINDINGS: Brain: Confluent hypodensities throughout the periventricular white matter are most consistent with age-indeterminate small vessel ischemic changes, likely chronic. Focal hypodensities in the left basal ganglia and right frontal periventricular white matter consistent with chronic lacunar infarcts. Age-indeterminate lacunar infarct is seen within the right basal ganglia image 18/3. No other signs of acute infarct or hemorrhage. Lateral ventricles and midline structures are otherwise unremarkable. No acute extra-axial fluid collections. No mass effect. Vascular: No hyperdense vessel or unexpected calcification. Skull: Normal. Negative for fracture or focal lesion. Sinuses/Orbits: No acute finding. Other: None. IMPRESSION: 1. Age indeterminate lacunar infarct within the right basal  ganglia. 2. Age-indeterminate small-vessel ischemic changes throughout the periventricular white matter, favor chronic. 3. Chronic left basal ganglia and right frontal white matter lacunar infarcts as above. 4. No acute hemorrhage.  Electronically Signed   By: Randa Ngo M.D.   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 Multiplanar CT image reconstructions were also generated. RADIATION DOSE REDUCTION: This exam was performed according to the departmental dose-optimization program which includes automated exposure control, adjustment of the mA and/or kV according to patient size and/or use of iterative reconstruction technique. COMPARISON:  None Available. FINDINGS: Alignment: Mild degenerative anterolisthesis of C2 and C3. Otherwise alignment is anatomic. Skull base and vertebrae: No acute fracture. No primary bone lesion or focal pathologic process. Soft tissues and spinal canal: No prevertebral fluid or swelling. No visible canal hematoma. Disc levels: Partial bony fusion across the disc spaces at C3-4 and C4-5. There is severe multilevel spondylosis throughout the remainder of the cervical spine, greatest at C5-6 and C6-7. Diffuse facet hypertrophy. Upper chest: Airway is patent. Lung apices are clear. Prominent atherosclerosis of the aortic arch. Other: Reconstructed images demonstrate no additional findings. IMPRESSION: 1. Extensive multilevel cervical degenerative changes. No acute fracture. Electronically Signed   By: Randa Ngo M.D.   On: 12/29/2021 17:38   MR ANGIO HEAD WO CONTRAST  Result Date: 12/30/2021 CLINICAL DATA:  There are deficit with acute stroke suspected EXAM: MRI HEAD WITHOUT CONTRAST MRA HEAD WITHOUT CONTRAST TECHNIQUE: Multiplanar, multi-echo pulse sequences of the brain and surrounding structures were acquired without intravenous contrast. Angiographic images of the Circle of Willis were acquired using MRA technique without intravenous contrast. COMPARISON:  Head CT from  yesterday FINDINGS: MRI HEAD FINDINGS Brain: Cluster of small acute infarcts in the right insular cortex, right frontal cortex, and deep right cerebral white matter. These are in a MCA distribution. Background of advanced chronic small vessel ischemia with confluent gliosis in the hemispheric white matter. Chronic lacunar infarcts in the deep cerebrum. Small remote left cerebral infarct. Gradient signal at the lower right sylvian fissure with there is calcification along the MCA branches by CT. Vascular: Major flow voids are preserved Skull and upper cervical spine: Normal marrow signal Sinuses/Orbits: Bilateral cataract resection MRA HEAD FINDINGS Anterior circulation: High-grade narrowing involving the upper right M2 branch with there is a gradient hypointensity noted above. Elsewhere vessels are smoothly contoured and widely patent. Posterior circulation: Vertebrobasilar arteries are smoothly contoured and widely patent. IMPRESSION: Brain MRI: 1. Cluster of small cortical and white matter infarcts within the right MCA distribution. 2. Background of advanced chronic small vessel ischemia. Intracranial MRA: Focal high-grade narrowing at the right M2 level where there is calcification by CT. This could reflect calcified plaque or a calcified embolus. If an embolus, the proximal nature and degree of mild infarct with suggest additional territory of risk. Electronically Signed   By: Jorje Guild M.D.   On: 12/30/2021 07:53   MR BRAIN WO CONTRAST  Result Date: 12/30/2021 CLINICAL DATA:  There are deficit with acute stroke suspected EXAM: MRI HEAD WITHOUT CONTRAST MRA HEAD WITHOUT CONTRAST TECHNIQUE: Multiplanar, multi-echo pulse sequences of the brain and surrounding structures were acquired without intravenous contrast. Angiographic images of the Circle of Willis were acquired using MRA technique without intravenous contrast. COMPARISON:  Head CT from yesterday FINDINGS: MRI  HEAD FINDINGS Brain: Cluster of  small acute infarcts in the right insular cortex, right frontal cortex, and deep right cerebral white matter. These are in a MCA distribution. Background of advanced chronic small vessel ischemia with confluent gliosis in the hemispheric white matter. Chronic lacunar infarcts in the deep cerebrum. Small remote left cerebral infarct. Gradient signal at the lower right sylvian fissure with there is calcification along the MCA branches by CT. Vascular: Major flow voids are preserved Skull and upper cervical spine: Normal marrow signal Sinuses/Orbits: Bilateral cataract resection MRA HEAD FINDINGS Anterior circulation: High-grade narrowing involving the upper right M2 branch with there is a gradient hypointensity noted above. Elsewhere vessels are smoothly contoured and widely patent. Posterior circulation: Vertebrobasilar arteries are smoothly contoured and widely patent. IMPRESSION: Brain MRI: 1. Cluster of small cortical and white matter infarcts within the right MCA distribution. 2. Background of advanced chronic small vessel ischemia. Intracranial MRA: Focal high-grade narrowing at the right M2 level where there is calcification by CT. This could reflect calcified plaque or a calcified embolus. If an embolus, the proximal nature and degree of mild infarct with suggest additional territory of risk. Electronically Signed   By: Jorje Guild M.D.   On: 12/30/2021 07:53   DG Chest Portable 1 View  Result Date: 12/29/2021 CLINICAL DATA:  CVA. Fell last night. Family concerned patient injured left side EXAM: PORTABLE CHEST 1 VIEW COMPARISON:  Chest two views 03/23/2013 FINDINGS: Cardiac silhouette is mildly enlarged. Moderate calcifications within the aortic arch and descending thoracic aorta. Minimal bibasilar horizontal linear chronic scarring is unchanged. No focal airspace opacity to indicate pneumonia. No pleural effusion or pneumothorax. Moderate multilevel degenerative disc changes of the thoracic spine.  IMPRESSION: Mild stable cardiomegaly. No acute lung process. Electronically Signed   By: Yvonne Kendall M.D.   On: 12/29/2021 19:19   DG Hand Complete Left  Result Date: 12/29/2021 CLINICAL DATA:  Fall.  Bruising to posterior left hand. EXAM: LEFT HAND - COMPLETE 3+ VIEW COMPARISON:  None Available. FINDINGS: There is diffuse decreased bone mineralization. A pulse oximeter overlies the distal index finger obscuring portions. Degenerative changes including joint space narrowing, subchondral sclerosis and peripheral osteophytosis are moderate at the triscaphe joint; moderate to severe at the thumb carpometacarpal, thumb metacarpophalangeal, and thumb interphalangeal joints; and moderate to severe at the DIP joints and moderate at the PIP joints of the second through fifth fingers. No acute fracture is seen. No dislocation. IMPRESSION: Osteoarthritis as above. No acute fracture. Electronically Signed   By: Yvonne Kendall M.D.   On: 12/29/2021 18:12   DG Swallowing Func-Speech Pathology  Result Date: 01/02/2022 Table formatting from the original result was not included. Objective Swallowing Evaluation: Type of Study: MBS-Modified Barium Swallow Study  Patient Details Name: Chad [name] MRN: [id number] Date of Birth: 10-04-1931 Today's Date: 01/02/2022 Time: SLP Start Time (ACUTE ONLY): 1100 -SLP Stop Time (ACUTE ONLY): 1123 SLP Time Calculation (min) (ACUTE ONLY): 23 min Past Medical History: Past Medical History: Diagnosis Date  Arthritis   Heart murmur   asa child   Hypertension   Stroke [facility])  Past Surgical History: Past Surgical History: Procedure Laterality Date  EYE SURGERY Bilateral 2022  JOINT REPLACEMENT    RADIOLOGY WITH ANESTHESIA N/A 01/01/2022  Procedure: Angiogram;  Surgeon: Luanne Bras, MD;  Location: [facility];  Service: Radiology;  Laterality: N/A;  TONSILLECTOMY    TOTAL KNEE ARTHROPLASTY Right 03/30/2013  Procedure: RIGHT TOTAL KNEE ARTHROPLASTY;  Surgeon: Tobi Bastos, MD;  Location:  [facility]  [facility];  Service: Orthopedics;  Laterality: Right;  TOTAL KNEE ARTHROPLASTY Left 10/09/2020  Procedure: TOTAL KNEE ARTHROPLASTY;  Surgeon: Paralee Cancel, MD;  Location: [facility];  Service: Orthopedics;  Laterality: Left;  70 mins HPI: 89 yo male presenting to the ED on 5/28 with L sided weakness, facial droop, slurred speech, and fall. 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 PMH including hypothyroidism chronic pain syndromes, CKD, HTN, arthritis, bil TKA.  Subjective: alert, friendly  Recommendations for follow up therapy are one component of a multi-disciplinary discharge planning process, led by the attending physician.  Recommendations may be updated based on patient status, additional functional criteria and insurance authorization. Assessment / Plan / Recommendation   01/02/2022  11:00 AM Clinical Impressions Clinical Impression Pt presents with oropharyngeal dysphagia characterized by reduced labial seal, impaired mastication, a pharyngeal delay and reduction in bolus cohesion, posterior bolus propulsion, tongue base retraction, anterior laryngeal movement, and pharyngeal constriction. He demonstrated left-sided anterior spillage, lingual residue, difficulty with A-P transport of solids, premature spillage to the valleculae and pyriform sinuses, and posterior pharyngeal wall residue. A liquid bolus was necessary to facilitate mastication and A-P transport of a nutrigrain bar. Liquid washes reduced pharyngeal residue to a more functional level and a left head turn was effective in reducing pyriform sinus residue with thin liquids, but pt exhibited notable difficulty maintaining this position. Penetration (PAS 3, 5) and aspiration (PAS 7, 8) were noted with thin liquids. Larger amounts of aspirate did trigger a cough which was mostly effective, but smaller quantities inconsistently triggered throat clearing which was inadequate for expulsion of material. No  functional benefit was noted with prompted coughing. A dysphagia 1 diet with nectar thick liquids is recommended at this time. SLP will follow for dysphagia treatment. SLP Visit Diagnosis Dysphagia, oropharyngeal phase (R13.12) Impact on safety and function Mild aspiration risk     01/02/2022  11:00 AM Treatment Recommendations Treatment Recommendations Therapy as outlined in treatment plan below     01/02/2022  11:00 AM Prognosis Prognosis for Safe Diet Advancement Good   01/02/2022  11:00 AM Diet Recommendations SLP Diet Recommendations Dysphagia 1 (Puree) solids;Nectar thick liquid Liquid Administration via Cup;Straw Medication Administration Whole meds with puree Compensations Slow rate;Small sips/bites;Monitor for anterior loss;Follow solids with liquid Postural Changes Seated upright at 90 degrees     01/02/2022  11:00 AM Other Recommendations Other Recommendations Order thickener from pharmacy Follow Up Recommendations Acute inpatient rehab (3hours/day) Assistance recommended at discharge Frequent or constant Supervision/Assistance Functional Status Assessment Patient has had a recent decline in their functional status and demonstrates the ability to make significant improvements in function in a reasonable and predictable amount of time.   01/02/2022  11:00 AM Frequency and Duration  Speech Therapy Frequency (ACUTE ONLY) min 2x/week Treatment Duration 2 weeks     01/02/2022  11:00 AM Oral Phase Oral Phase Impaired Oral - Nectar Cup Left anterior bolus loss;Reduced posterior propulsion;Lingual/palatal residue;Delayed oral transit;Decreased bolus cohesion;Premature spillage Oral - Nectar Straw Left anterior bolus loss;Reduced posterior propulsion;Lingual/palatal residue;Delayed oral transit;Decreased bolus cohesion;Premature spillage Oral - Thin Cup Left anterior bolus loss;Reduced posterior propulsion;Lingual/palatal residue;Delayed oral transit;Decreased bolus cohesion;Premature spillage Oral - Thin Straw Left anterior  bolus loss;Reduced posterior propulsion;Lingual/palatal residue;Delayed oral transit;Decreased bolus cohesion;Premature spillage Oral - Puree Left anterior bolus loss;Reduced posterior propulsion;Lingual/palatal residue;Delayed oral transit;Decreased bolus cohesion;Weak lingual manipulation Oral - Mech Soft Left anterior bolus loss;Reduced posterior propulsion;Lingual/palatal residue;Delayed oral transit;Decreased bolus cohesion;Weak lingual manipulation;Impaired mastication  Oral - Pill Left anterior bolus loss;Reduced posterior propulsion;Lingual/palatal residue;Delayed oral transit;Decreased bolus cohesion    01/02/2022  11:00 AM Pharyngeal Phase Pharyngeal Phase Impaired Pharyngeal- Thin Cup Reduced tongue base retraction;Reduced anterior laryngeal mobility;Delayed swallow initiation-pyriform sinuses;Reduced pharyngeal peristalsis;Penetration/Aspiration during swallow;Penetration/Apiration after swallow;Pharyngeal residue - valleculae;Pharyngeal residue - pyriform;Pharyngeal residue - posterior pharnyx Pharyngeal Material enters airway, remains ABOVE vocal cords and not ejected out;Material enters airway, CONTACTS cords and not ejected out;Material enters airway, passes BELOW cords and not ejected out despite cough attempt by patient;Material enters airway, passes BELOW cords without attempt by patient to eject out (silent aspiration) Pharyngeal- Thin Straw Reduced tongue base retraction;Reduced anterior laryngeal mobility;Delayed swallow initiation-pyriform sinuses;Reduced pharyngeal peristalsis;Penetration/Aspiration during swallow;Penetration/Apiration after swallow;Pharyngeal residue - valleculae;Pharyngeal residue - pyriform;Pharyngeal residue - posterior pharnyx Pharyngeal Material enters airway, passes BELOW cords and not ejected out despite cough attempt by patient;Material enters airway, passes BELOW cords without attempt by patient to eject out (silent aspiration);Material enters airway, CONTACTS cords  and then ejected out Pharyngeal- Puree Reduced tongue base retraction;Reduced anterior laryngeal mobility;Delayed swallow initiation-pyriform sinuses;Reduced pharyngeal peristalsis;Pharyngeal residue - valleculae;Pharyngeal residue - pyriform;Pharyngeal residue - posterior pharnyx Pharyngeal- Mechanical Soft Reduced tongue base retraction;Reduced anterior laryngeal mobility;Delayed swallow initiation-pyriform sinuses;Reduced pharyngeal peristalsis;Pharyngeal residue - valleculae;Pharyngeal residue - pyriform;Pharyngeal residue - posterior pharnyx Pharyngeal- Pill Reduced tongue base retraction;Reduced anterior laryngeal mobility;Delayed swallow initiation-pyriform sinuses;Reduced pharyngeal peristalsis;Pharyngeal residue - valleculae;Pharyngeal residue - pyriform;Pharyngeal residue - posterior pharnyx    01/02/2022  11:00 AM Cervical Esophageal Phase  Cervical Esophageal Phase [facility] Shanika I. Hardin Negus, [place], [place] Office number [phone] Pager [phone] Horton Marshall 01/02/2022, 12:20 PM                     ECHOCARDIOGRAM COMPLETE  Result Date: 12/30/2021    ECHOCARDIOGRAM REPORT   Patient Name:   HUGO LYBRAND Date of Exam: 12/30/2021 Medical Rec #:  [id number]         Height:       66.0 in Accession #:    [id number]        Weight:       133.6 lb Date of Birth:  Feb 22, 1932          BSA:          1.685 m Patient Age:    69 years          BP:           161/84 mmHg Patient Gender: M                 HR:           66 bpm. Exam Location:  Inpatient Procedure: 2D Echo Indications:    stroke  History:        Patient has prior history of Echocardiogram examinations, most                 recent 04/25/2020. Chronic kidney disease.; Risk                 Factors:Hypertension and Dyslipidemia.  Sonographer:    Johny Chess RDCS Referring Phys: [place]  1. Left ventricular ejection fraction, by estimation, is 55 to 60%. The left ventricle has normal  function. The left ventricle has no regional wall motion abnormalities. There is mild left ventricular hypertrophy. Left ventricular diastolic parameters are consistent with Grade I diastolic dysfunction (impaired relaxation).  2. Right ventricular systolic function is normal. The right ventricular size is normal.  3.  Left atrial size was mildly dilated.  4. The mitral valve is normal in structure. Mild mitral valve regurgitation. No evidence of mitral stenosis.  5. The aortic valve is tricuspid. Aortic valve regurgitation is mild. Aortic valve sclerosis/calcification is present, without any evidence of aortic stenosis.  6. The inferior vena cava is normal in size with greater than 50% respiratory variability, suggesting right atrial pressure of 3 mmHg. FINDINGS  Left Ventricle: Left ventricular ejection fraction, by estimation, is 55 to 60%. The left ventricle has normal function. The left ventricle has no regional wall motion abnormalities. The left ventricular internal cavity size was normal in size. There is  mild left ventricular hypertrophy. Left ventricular diastolic parameters are consistent with Grade I diastolic dysfunction (impaired relaxation). Right Ventricle: The right ventricular size is normal. Right ventricular systolic function is normal. Left Atrium: Left atrial size was mildly dilated. Right Atrium: Right atrial size was normal in size. Pericardium: Trivial pericardial effusion is present. Mitral Valve: The mitral valve is normal in structure. Mild mitral annular calcification. Mild mitral valve regurgitation. No evidence of mitral valve stenosis. Tricuspid Valve: The tricuspid valve is normal in structure. Tricuspid valve regurgitation is trivial. No evidence of tricuspid stenosis. Aortic Valve: The aortic valve is tricuspid. Aortic valve regurgitation is mild. Aortic regurgitation PHT measures 400 msec. Aortic valve sclerosis/calcification is present, without any evidence of aortic stenosis.  Pulmonic Valve: The pulmonic valve was normal in structure. Pulmonic valve regurgitation is trivial. No evidence of pulmonic stenosis. Aorta: The aortic root is normal in size and structure. Venous: The inferior vena cava is normal in size with greater than 50% respiratory variability, suggesting right atrial pressure of 3 mmHg. IAS/Shunts: No atrial level shunt detected by color flow Doppler.  LEFT VENTRICLE PLAX 2D LVIDd:         5.20 cm   Diastology LVIDs:         3.10 cm   LV e' medial:    4.50 cm/s LV PW:         1.20 cm   LV E/e' medial:  12.0 LV IVS:        1.10 cm   LV e' lateral:   4.20 cm/s LVOT diam:     2.10 cm   LV E/e' lateral: 12.9 LV SV:         80 LV SV Index:   47 LVOT Area:     3.46 cm  RIGHT VENTRICLE             IVC RV S prime:     15.70 cm/s  IVC diam: 1.00 cm TAPSE (M-mode): 2.0 cm LEFT ATRIUM             Index        RIGHT ATRIUM           Index LA diam:        3.40 cm 2.02 cm/m   RA Area:     16.30 cm LA Vol (A2C):   79.2 ml 47.01 ml/m  RA Volume:   39.80 ml  23.62 ml/m LA Vol (A4C):   47.5 ml 28.20 ml/m LA Biplane Vol: 63.0 ml 37.40 ml/m  AORTIC VALVE LVOT Vmax:   102.00 cm/s LVOT Vmean:  67.600 cm/s LVOT VTI:    0.230 m AI PHT:      400 msec  AORTA Ao Root diam: 3.70 cm Ao Asc diam:  3.10 cm MITRAL VALVE MV Area (PHT): 4.60 cm    SHUNTS MV Decel  Time: 165 msec    Systemic VTI:  0.23 m MR Peak grad: 134.6 mmHg   Systemic Diam: 2.10 cm MR Mean grad: 82.0 mmHg MR Vmax:      580.00 cm/s MR Vmean:     416.0 cm/s MV E velocity: 54.00 cm/s MV A velocity: 96.40 cm/s MV E/A ratio:  0.56 Kirk Ruths MD Electronically signed by Kirk Ruths MD Signature Date/Time: 12/30/2021/2:40:23 PM    Final    VAS US CAROTID  Result Date: 12/31/2021 Carotid Arterial Duplex Study Patient Name:  Chad [name]  Date of Exam:   12/30/2021 Medical Rec #: [id number]          Accession #:    [id number] Date of Birth: [birth year]           Patient Gender: M Patient Age:   33 years Exam Location:  [facility] Procedure:      VAS US CAROTID Referring Phys: Nyoka Lint DOUTOVA --------------------------------------------------------------------------------  Indications:       CVA. Risk Factors:      Hypertension. Comparison Study:  No prior study Performing Technologist: Maudry Mayhew MHA, RDMS, RVT, RDCS  Examination Guidelines: A complete evaluation includes B-mode imaging, spectral Doppler, color Doppler, and power Doppler as needed of all accessible portions of each vessel. Bilateral testing is considered an integral part of a complete examination. Limited examinations for reoccurring indications may be performed as noted.  Right Carotid Findings: +----------+-------+--------+--------+-------------------------------+---------+           PSV    EDV cm/sStenosisPlaque Description             Comments            cm/s                                                            +----------+-------+--------+--------+-------------------------------+---------+ CCA Prox  54     7               heterogenous and irregular               +----------+-------+--------+--------+-------------------------------+---------+ CCA Distal58     9               smooth and heterogenous                  +----------+-------+--------+--------+-------------------------------+---------+ ICA Prox  132    19              smooth, heterogenous and       Shadowing                                  calcific                                 +----------+-------+--------+--------+-------------------------------+---------+ ICA Distal78     15                                                       +----------+-------+--------+--------+-------------------------------+---------+ ECA  126                                                             +----------+-------+--------+--------+-------------------------------+---------+  +----------+--------+-------+----------------+-------------------+           PSV cm/sEDV cmsDescribe        Arm Pressure (mmHG) +----------+--------+-------+----------------+-------------------+ [id number]            Multiphasic, WNL                    +----------+--------+-------+----------------+-------------------+ +---------+--------+--+--------+-+---------+ VertebralPSV cm/s51EDV cm/s5Antegrade +---------+--------+--+--------+-+---------+  Left Carotid Findings: +----------+--------+--------+--------+------------------------------+---------+           PSV cm/sEDV cm/sStenosisPlaque Description            Comments  +----------+--------+--------+--------+------------------------------+---------+ CCA Prox  89                      heterogenous and irregular    tortuous  +----------+--------+--------+--------+------------------------------+---------+ CCA Distal70      8                                                       +----------+--------+--------+--------+------------------------------+---------+ ICA Prox  49      12              heterogenous, irregular and   Shadowing                                   calcific                                +----------+--------+--------+--------+------------------------------+---------+ ICA Distal98      18                                                      +----------+--------+--------+--------+------------------------------+---------+ ECA       67      10                                                      +----------+--------+--------+--------+------------------------------+---------+ +----------+--------+--------+----------------+-------------------+           PSV cm/sEDV cm/sDescribe        Arm Pressure (mmHG) +----------+--------+--------+----------------+-------------------+ [id number]             Multiphasic, WNL                     +----------+--------+--------+----------------+-------------------+ +---------+--------+--+--------+--+---------+ VertebralPSV cm/s94EDV cm/s14Antegrade +---------+--------+--+--------+--+---------+   Summary: Right Carotid: Velocities in the right ICA are consistent with a 1-39% stenosis. Left Carotid: Velocities in the left ICA are consistent with a 1-39% stenosis. Vertebrals:  Bilateral vertebral arteries demonstrate antegrade flow. Subclavians: Normal flow hemodynamics were seen in bilateral subclavian  arteries. *See table(s) above for measurements and observations.  Electronically signed by Antony Contras MD on 12/31/2021 at 3:59:42 PM.    Final      PHYSICAL EXAM  Temp:  [98.2 F (36.8 C)-99 F (37.2 C)] 98.3 F (36.8 C) (06/01 1541) Pulse Rate:  [75-112] 95 (06/01 1541) Resp:  [14-29] 17 (06/01 1541) BP: (108-166)/(57-93) 150/81 (06/01 1541) SpO2:  [96 %-100 %] 100 % (06/01 1541) Arterial Line BP: (139-158)/(48-66) 147/50 (06/01 0900) Weight:  [61.3 kg] 61.3 kg (06/01 1541)  General - Well nourished, well developed, in no apparent distress.  Ophthalmologic - fundi not visualized due to noncooperation.  Cardiovascular - Regular rhythm and rate.  Neuro - awake, alert, eyes open, hard of hearing, orientated to age, place, time and people. No aphasia, paucity of speech, moderate dysarthria but improving from yesterday, following all simple commands. Able to name and repeat with dysarthric voice. No gaze palsy, tracking bilaterally, visual field full but with possible left simultagnosia, PERRL. Left facial droop. Tongue midline. RUE at least 4/5 and LUE 2-/5 bicep, otherwise 0/5. RLE 4/5, LLE proximal not able to check due to femoral access but 2/5 distal DF. Sensation not cooperative, right FTN intact grossly, gait not tested.    ASSESSMENT/PLAN Chad [name] is a 88 y.o. male with history of hypertension, hyperlipidemia, hearing loss admitted for left-sided  weakness and fell at home. No tPA given due to outside window.    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 MRA right M 2 high-grade stenosis MRI right MCA scattered small infarcts CT head and neck proximal right M2 occlusion due to calcified embolus.  Extensive calcified atherosclerosis at right ICA bifurcation with 80% stenosis.  Severe stenosis nondominant right VA origin.  Carotid Doppler unremarkable 2D Echo EF 55 to 60% LDL 90 HgbA1c 6.1 Lovenox for VTE prophylaxis No antithrombotic prior to admission, now on aspirin 325 mg daily and clopidogrel 75 mg daily DAPT for 3 months and then ASA alone.  Patient counseled to be compliant with his antithrombotic medications Ongoing aggressive stroke risk factor management Therapy recommendations:  CIR Disposition:  pending  Carotid stenosis CT head and neck extensive calcified atherosclerosis at right ICA bifurcation with 80% stenosis. Likely the source for right M2 occlusion and right MCA stroke Dr. Estanislado Pandy on board S/p right carotid angioplasty 5/31, not able to have stent placement due to tortuosity of the vessel On DAPT Follow up with Dr. Estanislado Pandy in 1-2 months, if re-stenosis, may need to consider right CEA  Hypertension Stable Long term BP goal normotensive  Hyperlipidemia Home meds: Lovastatin 40 LDL 90, goal < 70 Now on Crestor 20 Continue statin at discharge  Other Stroke Risk Factors Advanced age  Other Active Problems Hard of hearing Independent lives alone before admission  Hospital day # 4  Neurology will sign off. Please call with questions. Pt will follow up with stroke clinic NP at [facility] in about 4 weeks. Thanks for the consult.   Rosalin Hawking, MD PhD Stroke Neurology 01/02/2022 5:25 PM    To  contact Stroke Continuity provider, please refer to [email or web address] After hours, contact General Neurology

## 2022-01-02 NOTE — Discharge Instructions (Signed)
Inpatient Rehab Discharge Instructions  Chad [name] Discharge date and time: No discharge date for patient encounter.   Activities/Precautions/ Functional Status: Activity: As tolerated Diet:  Wound Care: Routine skin checks Functional status:  ___ No restrictions     ___ Walk up steps independently ___ 24/7 supervision/assistance   ___ Walk up steps with assistance ___ Intermittent supervision/assistance  ___ Bathe/dress independently ___ Walk with walker     _x__ Bathe/dress with assistance ___ Walk Independently    ___ Shower independently ___ Walk with assistance    ___ Shower with assistance ___ No alcohol     ___ Return to work/school ________  Special Instructions: No driving smoking or alcohol   My questions have been answered and I understand these instructions. I will adhere to these goals and the provided educational materials after my discharge from the hospital.  Patient/Caregiver Signature _______________________________ Date __________  Clinician Signature _______________________________________ Date __________  Please bring this form and your medication list with you to all your follow-up doctor's appointments.  STROKE/TIA DISCHARGE INSTRUCTIONS SMOKING Cigarette smoking nearly doubles your risk of having a stroke & is the single most alterable risk factor  If you smoke or have smoked in the last 12 months, you are advised to quit smoking for your health. Most of the excess cardiovascular risk related to smoking disappears within a year of stopping. Ask you doctor about anti-smoking medications [place] Quit Line: 1-800-QUIT NOW Free Smoking Cessation Classes [phone]  CHOLESTEROL Know your levels; limit fat & cholesterol in your diet  Lipid Panel     Component Value Date/Time   CHOL 149 12/30/2021 0412   TRIG 68 12/30/2021 0412   HDL 45 12/30/2021 0412   CHOLHDL 3.3 12/30/2021 0412   VLDL 14 12/30/2021 0412   LDLCALC 90 12/30/2021 0412     Many  patients benefit from treatment even if their cholesterol is at goal. Goal: Total Cholesterol (CHOL) less than 160 Goal:  Triglycerides (TRIG) less than 150 Goal:  HDL greater than 40 Goal:  LDL (LDLCALC) less than 100   BLOOD PRESSURE American Stroke Association blood pressure target is less that 120/80 mm/Hg  Your discharge blood pressure is:  BP: (!) 178/90 Monitor your blood pressure Limit your salt and alcohol intake Many individuals will require more than one medication for high blood pressure  DIABETES (A1c is a blood sugar average for last 3 months) Goal HGBA1c is under 7% (HBGA1c is blood sugar average for last 3 months)  Diabetes:   Lab Results  Component Value Date   HGBA1C 6.1 (H) 12/29/2021    Your HGBA1c can be lowered with medications, healthy diet, and exercise. Check your blood sugar as directed by your physician Call your physician if you experience unexplained or low blood sugars.  PHYSICAL ACTIVITY/REHABILITATION Goal is 30 minutes at least 4 days per week  Activity: Increase activity slowly, Therapies: Physical Therapy: Home Health Return to work:  Activity decreases your risk of heart attack and stroke and makes your heart stronger.  It helps control your weight and blood pressure; helps you relax and can improve your mood. Participate in a regular exercise program. Talk with your doctor about the best form of exercise for you (dancing, walking, swimming, cycling).  DIET/WEIGHT Goal is to maintain a healthy weight  Your discharge diet is:  Diet Order             DIET - DYS 1 Room service appropriate? Yes with Assist; Fluid consistency: Nectar Thick  Diet effective now                   liquids Your height is:  Height: 5\' 1"  (154.9 cm) Your current weight is: Weight: 61.3 kg Your Body Mass Index (BMI) is:  BMI (Calculated): 25.55 Following the type of diet specifically designed for you will help prevent another stroke. Your goal weight range is:   Your  goal Body Mass Index (BMI) is 19-24. Healthy food habits can help reduce 3 risk factors for stroke:  High cholesterol, hypertension, and excess weight.  RESOURCES Stroke/Support Group:  Call [phone]   STROKE EDUCATION PROVIDED/REVIEWED AND GIVEN TO PATIENT Stroke warning signs and symptoms How to activate emergency medical system (call 911). Medications prescribed at discharge. Need for follow-up after discharge. Personal risk factors for stroke. Pneumonia vaccine given: No Flu vaccine given: No My questions have been answered, the writing is legible, and I understand these instructions.  I will adhere to these goals & educational materials that have been provided to me after my discharge from the hospital.

## 2022-01-02 NOTE — Progress Notes (Signed)
Speech Language Pathology Treatment: Dysphagia  Patient Details Name: Chad [name] MRN: [id number] DOB: 06-Apr-1932 Today's Date: 01/02/2022 Time: 8502-7741 SLP Time Calculation (min) (ACUTE ONLY): 10 min  Assessment / Plan / Recommendation Clinical Impression  Pt was seen for dysphagia treatment with his daughter present. [name] swallow function appears improved with tolerance of thin liquids via cup without overt s/sx of aspiration. However, coughing was inconsistently noted with larger boluses of thin liquids via straw or with consecutive swallows. Anterior spillage noted to the left and labial stripping reduced with purees. [name] swallow function appears improved, but an MBS still appears to be clinically indicated and it is currently scheduled for 10:00. [name] current diet may be continued until the MBS is completed.    HPI HPI: 88 yo male presenting to the ED on 5/28 with L sided weakness, facial droop, slurred speech, and fall. 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 PMH including hypothyroidism chronic pain syndromes, CKD, HTN, arthritis, bil TKA.      SLP Plan  Continue with current plan of care;MBS      Recommendations for follow up therapy are one component of a multi-disciplinary discharge planning process, led by the attending physician.  Recommendations may be updated based on patient status, additional functional criteria and insurance authorization.    Recommendations  Diet recommendations: Dysphagia 1 (puree);Pudding-thick liquid Liquids provided via: Teaspoon Medication Administration: Crushed with puree Supervision: Full supervision/cueing for compensatory strategies Compensations: Slow rate;Small sips/bites;Monitor for anterior loss                Oral Care Recommendations: Oral care BID Follow Up Recommendations: Acute inpatient rehab (3hours/day) Assistance recommended at discharge: Frequent or constant  Supervision/Assistance SLP Visit Diagnosis: Dysphagia, oropharyngeal phase (R13.12) Plan: Continue with current plan of care;MBS         Jatziri Goffredo I. Hardin Negus, [place], [place] Office number [phone] Pager [place]  01/02/2022, 9:32 AM

## 2022-01-02 NOTE — Anesthesia Postprocedure Evaluation (Signed)
Anesthesia Post Note  Patient: Chad [name]  Procedure(s) Performed: Angiogram     Patient location during evaluation: PACU Anesthesia Type: General Level of consciousness: awake and alert Pain management: pain level controlled Vital Signs Assessment: post-procedure vital signs reviewed and stable Respiratory status: spontaneous breathing, nonlabored ventilation, respiratory function stable and patient connected to nasal cannula oxygen Cardiovascular status: blood pressure returned to baseline and stable Postop Assessment: no apparent nausea or vomiting Anesthetic complications: no   No notable events documented.  Last Vitals:  Vitals:   01/02/22 0600 01/02/22 0700  BP: (!) 141/65 132/70  Pulse: (!) 104 (!) 103  Resp: 19 18  Temp:    SpO2: 100% 100%    Last Pain:  Vitals:   01/02/22 0600  TempSrc:   PainSc: 0-No pain                 Tiajuana Amass

## 2022-01-02 NOTE — Progress Notes (Signed)
Inpatient Rehabilitation Admission Medication Review by a Pharmacist  A complete drug regimen review was completed for this patient to identify any potential clinically significant medication issues.  High Risk Drug Classes Is patient taking? Indication by Medication  Antipsychotic No   Anticoagulant Yes Lovenox for VTE ppx  Antibiotic No   Opioid Yes Vicodin prn pain  Antiplatelet Yes Brilinita/Aspirin - CVA stent  Hypoglycemics/insulin No   Vasoactive Medication Yes Atenolol, amlodipine for BP  Chemotherapy No   Other Yes Flomax for BPH Rosuvastatin for HLD     Type of Medication Issue Identified Description of Issue Recommendation(s)  Drug Interaction(s) (clinically significant)     Duplicate Therapy     Allergy     No Medication Administration End Date     Incorrect Dose     Additional Drug Therapy Needed     Significant med changes from prior encounter (inform family/care partners about these prior to discharge).    Other       Clinically significant medication issues were identified that warrant physician communication and completion of prescribed/recommended actions by midnight of the next day:  No  Pharmacist comments: None  Time spent performing this drug regimen review (minutes):  20 minutes   Tad Moore 01/02/2022 1:32 PM

## 2022-01-02 NOTE — Discharge Summary (Signed)
Physician Discharge Summary  Chad [name] [id number] DOB: 06-29-1932 DOA: 12/29/2021   PCP: Chad [name], MD   Admit date: 12/29/2021 Discharge date: 01/02/2022   Admitted From: Home Disposition: CIR   Discharge Condition:Stable CODE STATUS:FULL Diet recommendation: Dysphagia 1   Brief/Interim Summary:   Patient is a 88 year old male with history of hypothyroidism, chronic pain syndrome, CKD stage IIIa hypertension, arthritis who presented with a fall from home.  Patient lives alone.  Patient was also found to have left-sided weakness, left-sided facial droop, slurred speech.  CT head showed age-indeterminate lacunar infarct within the right basal ganglia.  Stroke suspected and was admitted.  Neurology consulted.  MRI showed right MCA distribution infarcts,.  Stroke work-up initiated.  Imagings also showed occlusion of a proximal right M2 MCA branch .  Cerebral angiogram showed severe high-grade stenosis of right internal carotid artery status post balloon angioplasty.  PT/OT recommending CIR on discharge.  Medically stable for discharge today.   Following problems were addressed during his hospitalization:    Acute ischemic stroke: Presented with fall.  Found to have left-sided weakness.  Neurology were following.   MRI showed cluster of small cortical and white matter infarcts within the right MCA distribution.Intracranial MRA showed focal high-grade narrowing at the right M2 level .  CT angio head and neck also showed 80% stenosis of the proximal ICA,severe stenosis of the non dominant right vertebral artery origin. IR consulted,cerebral angiogram showed severe high-grade stenosis of right internal carotid artery status post balloon angioplasty.  Echo showed EF of 55 to 76%, grade 1 diastolic dysfunction no atrial level shunt or any intracardiac source of emboli.   Hemoglobin A1c 6.1.  LDL 90.  Started on a statin, aspirin, Brilinta. Currently he is alert oriented.  At baseline, he  lives alone, can walk, cooks his own food.  Has dense left hemiplegia now   Hypertension: Takes amlodipine, atenolol at home.  Resumed   CKD stage IIIa: Currently kidney function at Baseline.   Hyperlipidemia: Continue crestor   Arthritis: Continue supportive care.       Discharge Diagnoses:  Principal Problem:   Stroke (cerebrum) ([place]) Active Problems:   CKD (chronic kidney disease) stage 3, GFR 30-59 ml/min ([facility])   Anemia   Hyperlipidemia   Essential hypertension   Internal carotid artery stenosis, right       Discharge Instructions   Discharge Instructions       Diet general   Complete by: As directed      Dysphagia 1 diet    Discharge instructions   Complete by: As directed      1)Follow up with neurology 2)Take prescribed medication as instructed 3)Do a Bmp test in 3 days    Increase activity slowly   Complete by: As directed      No wound care   Complete by: As directed           Allergies as of 01/02/2022   No Known Allergies         Medication List       STOP taking these medications     lovastatin 40 MG tablet Commonly known as: MEVACOR    methocarbamol 500 MG tablet Commonly known as: ROBAXIN    polyethylene glycol 17 g packet Commonly known as: MIRALAX / GLYCOLAX           TAKE these medications     amLODipine 5 MG tablet Commonly known as: NORVASC Take 5 mg by mouth daily.  aspirin 81 MG chewable tablet Chew 1 tablet (81 mg total) by mouth daily. Start taking on: January 03, 2022    atenolol 100 MG tablet Commonly known as: TENORMIN Take 100 mg by mouth every morning.    docusate sodium 100 MG capsule Commonly known as: COLACE Take 1 capsule (100 mg total) by mouth 2 (two) times daily.    ferrous sulfate 325 (65 FE) MG tablet Take 1 tablet (325 mg total) by mouth 2 (two) times daily with a meal for 14 days.    HYDROcodone-acetaminophen 7.5-325 MG tablet Commonly known as: NORCO Take 1 tablet by mouth 3 (three) times daily  as needed for moderate pain. What changed: Another medication with the same name was removed. Continue taking this medication, and follow the directions you see here.    rosuvastatin 20 MG tablet Commonly known as: CRESTOR Take 1 tablet (20 mg total) by mouth daily. Start taking on: January 03, 2022    tamsulosin 0.4 MG Caps capsule Commonly known as: FLOMAX Take 0.4 mg by mouth daily.    ticagrelor 90 MG Tabs tablet Commonly known as: BRILINTA Take 1 tablet (90 mg total) by mouth 2 (two) times daily.             No Known Allergies   Consultations: Neurology     Procedures/Studies: CT ANGIO HEAD NECK W WO CM   Result Date: 12/30/2021 CLINICAL DATA:  Stroke/TIA, determine embolic source EXAM: CT ANGIOGRAPHY HEAD AND NECK TECHNIQUE: Multidetector CT imaging of the head and neck was performed using the standard protocol during bolus administration of intravenous contrast. Multiplanar CT image reconstructions and MIPs were obtained to evaluate the vascular anatomy. Carotid stenosis measurements (when applicable) are obtained utilizing NASCET criteria, using the distal internal carotid diameter as the denominator. RADIATION DOSE REDUCTION: This exam was performed according to the departmental dose-optimization program which includes automated exposure control, adjustment of the mA and/or kV according to patient size and/or use of iterative reconstruction technique. CONTRAST:  28mL OMNIPAQUE IOHEXOL 350 MG/ML SOLN COMPARISON:  Same day MRI/MRA. FINDINGS: CT HEAD FINDINGS Brain: Small right MCA territory infarcts better characterized on same day MRI. No evidence of acute hemorrhage, mass effect, midline shift, extra-axial fluid collection, or hydrocephalus. Patchy white matter hypodensities, nonspecific but compatible with chronic microvascular ischemic disease. Vascular: Detailed below. Skull: No acute fracture. Sinuses: Mild paranasal sinus mucosal thickening. Orbits: No acute orbital findings.  Review of the MIP images confirms the above findings CTA NECK FINDINGS Aortic arch: Calcific atherosclerosis of the aorta and great vessel origins. Ectatic right subclavian artery. Right carotid system: Atherosclerosis at the carotid bifurcation with approximately 80% stenosis. Left carotid system: Atherosclerosis at the carotid bifurcation without greater than 50% stenosis. Vertebral arteries: Potentially severe stenosis of the right vertebral artery origin due to calcific atherosclerosis. Otherwise, vertebral arteries are patent without significant (greater than 50%) stenosis. Left dominant. Skeleton: Severe multilevel degenerative change. Other neck: No acute findings. Upper chest: Clear sinuses. Review of the MIP images confirms the above findings CTA HEAD FINDINGS Anterior circulation: Bilateral intracranial ICAs are patent with mild narrowing due to calcific atherosclerosis. Bilateral M1 MCAs and left M2 MCAs are patent. Apparent occlusion of a right M2 MCA branch in a region of calcification. Bilateral ACAs are patent. Posterior circulation: Left dominant intradural vertebral artery. Right intradural vertebral artery makes very small contribution to the basilar artery. Basilar artery and bilateral posterior cerebral arteries are patent without proximal hemodynamically significant stenosis. Venous sinuses: As permitted by contrast  timing, patent. Anatomic variants: Detailed above. Review of the MIP images confirms the above findings IMPRESSION: CTA: 1. 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 2. Extensive calcific atherosclerosis at the right carotid bifurcation with approximately 80% stenosis of the proximal ICA. 3. 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 CT head: Small right MCA territory infarcts better  characterized on same day MRI. No evidence of progressive mass effect or acute hemorrhage. Findings discussed with Dr. Erlinda Hong via telephone at 12:50 p.m. Electronically Signed   By: Margaretha Sheffield M.D.   On: 12/30/2021 13:00    DG Elbow Complete Left   Result Date: 12/29/2021 CLINICAL DATA:  Fall.  Left elbow pain and bruising. EXAM: LEFT ELBOW - COMPLETE 3+ VIEW COMPARISON:  None Available. FINDINGS: No fracture or dislocation. Narrowed radiocapitellar joint. Marginal osteophytes project from the base of the radial head. No joint effusion. There is soft tissue edema posteriorly. IMPRESSION: No fracture or dislocation. Electronically Signed   By: Lajean Manes M.D.   On: 12/29/2021 18:12    CT HEAD WO CONTRAST   Result Date: 12/29/2021 CLINICAL DATA:  Neurologic deficit EXAM: CT HEAD WITHOUT CONTRAST TECHNIQUE: Contiguous axial images were obtained from the base of the skull through the vertex without intravenous contrast. RADIATION DOSE REDUCTION: This exam was performed according to the departmental dose-optimization program which includes automated exposure control, adjustment of the mA and/or kV according to patient size and/or use of iterative reconstruction technique. COMPARISON:  None Available. FINDINGS: Brain: Confluent hypodensities throughout the periventricular white matter are most consistent with age-indeterminate small vessel ischemic changes, likely chronic. Focal hypodensities in the left basal ganglia and right frontal periventricular white matter consistent with chronic lacunar infarcts. Age-indeterminate lacunar infarct is seen within the right basal ganglia image 18/3. No other signs of acute infarct or hemorrhage. Lateral ventricles and midline structures are otherwise unremarkable. No acute extra-axial fluid collections. No mass effect. Vascular: No hyperdense vessel or unexpected calcification. Skull: Normal. Negative for fracture or focal lesion. Sinuses/Orbits: No acute finding.  Other: None. IMPRESSION: 1. Age indeterminate lacunar infarct within the right basal ganglia. 2. Age-indeterminate small-vessel ischemic changes throughout the periventricular white matter, favor chronic. 3. Chronic left basal ganglia and right frontal white matter lacunar infarcts as above. 4. No acute hemorrhage. Electronically Signed   By: Randa Ngo M.D.   On: 12/29/2021 17:35    CT Cervical Spine Wo Contrast   Result Date: 12/29/2021 CLINICAL DATA:  There logic deaf sick, neck trauma EXAM: CT CERVICAL SPINE WITHOUT CONTRAST TECHNIQUE: Multidetector CT imaging of the cervical spine was performed without intravenous contrast. Multiplanar CT image reconstructions were also generated. RADIATION DOSE REDUCTION: This exam was performed according to the departmental dose-optimization program which includes automated exposure control, adjustment of the mA and/or kV according to patient size and/or use of iterative reconstruction technique. COMPARISON:  None Available. FINDINGS: Alignment: Mild degenerative anterolisthesis of C2 and C3. Otherwise alignment is anatomic. Skull base and vertebrae: No acute fracture. No primary bone lesion or focal pathologic process. Soft tissues and spinal canal: No prevertebral fluid or swelling. No visible canal hematoma. Disc levels: Partial bony fusion across the disc spaces at C3-4 and C4-5. There is severe multilevel spondylosis throughout the remainder of the cervical spine, greatest at C5-6 and C6-7. Diffuse facet hypertrophy. Upper chest: Airway is patent. Lung apices are clear.  Prominent atherosclerosis of the aortic arch. Other: Reconstructed images demonstrate no additional findings. IMPRESSION: 1. Extensive multilevel cervical degenerative changes. No acute fracture. Electronically Signed   By: Randa Ngo M.D.   On: 12/29/2021 17:38    MR ANGIO HEAD WO CONTRAST   Result Date: 12/30/2021 CLINICAL DATA:  There are deficit with acute stroke suspected EXAM: MRI  HEAD WITHOUT CONTRAST MRA HEAD WITHOUT CONTRAST TECHNIQUE: Multiplanar, multi-echo pulse sequences of the brain and surrounding structures were acquired without intravenous contrast. Angiographic images of the Circle of Willis were acquired using MRA technique without intravenous contrast. COMPARISON:  Head CT from yesterday FINDINGS: MRI HEAD FINDINGS Brain: Cluster of small acute infarcts in the right insular cortex, right frontal cortex, and deep right cerebral white matter. These are in a MCA distribution. Background of advanced chronic small vessel ischemia with confluent gliosis in the hemispheric white matter. Chronic lacunar infarcts in the deep cerebrum. Small remote left cerebral infarct. Gradient signal at the lower right sylvian fissure with there is calcification along the MCA branches by CT. Vascular: Major flow voids are preserved Skull and upper cervical spine: Normal marrow signal Sinuses/Orbits: Bilateral cataract resection MRA HEAD FINDINGS Anterior circulation: High-grade narrowing involving the upper right M2 branch with there is a gradient hypointensity noted above. Elsewhere vessels are smoothly contoured and widely patent. Posterior circulation: Vertebrobasilar arteries are smoothly contoured and widely patent. IMPRESSION: Brain MRI: 1. Cluster of small cortical and white matter infarcts within the right MCA distribution. 2. Background of advanced chronic small vessel ischemia. Intracranial MRA: Focal high-grade narrowing at the right M2 level where there is calcification by CT. This could reflect calcified plaque or a calcified embolus. If an embolus, the proximal nature and degree of mild infarct with suggest additional territory of risk. Electronically Signed   By: Jorje Guild M.D.   On: 12/30/2021 07:53    MR BRAIN WO CONTRAST   Result Date: 12/30/2021 CLINICAL DATA:  There are deficit with acute stroke suspected EXAM: MRI HEAD WITHOUT CONTRAST MRA HEAD WITHOUT CONTRAST TECHNIQUE:  Multiplanar, multi-echo pulse sequences of the brain and surrounding structures were acquired without intravenous contrast. Angiographic images of the Circle of Willis were acquired using MRA technique without intravenous contrast. COMPARISON:  Head CT from yesterday FINDINGS: MRI HEAD FINDINGS Brain: Cluster of small acute infarcts in the right insular cortex, right frontal cortex, and deep right cerebral white matter. These are in a MCA distribution. Background of advanced chronic small vessel ischemia with confluent gliosis in the hemispheric white matter. Chronic lacunar infarcts in the deep cerebrum. Small remote left cerebral infarct. Gradient signal at the lower right sylvian fissure with there is calcification along the MCA branches by CT. Vascular: Major flow voids are preserved Skull and upper cervical spine: Normal marrow signal Sinuses/Orbits: Bilateral cataract resection MRA HEAD FINDINGS Anterior circulation: High-grade narrowing involving the upper right M2 branch with there is a gradient hypointensity noted above. Elsewhere vessels are smoothly contoured and widely patent. Posterior circulation: Vertebrobasilar arteries are smoothly contoured and widely patent. IMPRESSION: Brain MRI: 1. Cluster of small cortical and white matter infarcts within the right MCA distribution. 2. Background of advanced chronic small vessel ischemia. Intracranial MRA: Focal high-grade narrowing at the right M2 level where there is calcification by CT. This could reflect calcified plaque or a calcified embolus. If an embolus, the proximal nature and degree of mild infarct with suggest additional territory of risk. Electronically Signed   By: Jorje Guild M.D.   On:  12/30/2021 07:53    DG Chest Portable 1 View   Result Date: 12/29/2021 CLINICAL DATA:  CVA. Fell last night. Family concerned patient injured left side EXAM: PORTABLE CHEST 1 VIEW COMPARISON:  Chest two views 03/23/2013 FINDINGS: Cardiac silhouette is  mildly enlarged. Moderate calcifications within the aortic arch and descending thoracic aorta. Minimal bibasilar horizontal linear chronic scarring is unchanged. No focal airspace opacity to indicate pneumonia. No pleural effusion or pneumothorax. Moderate multilevel degenerative disc changes of the thoracic spine. IMPRESSION: Mild stable cardiomegaly. No acute lung process. Electronically Signed   By: Yvonne Kendall M.D.   On: 12/29/2021 19:19    DG Hand Complete Left   Result Date: 12/29/2021 CLINICAL DATA:  Fall.  Bruising to posterior left hand. EXAM: LEFT HAND - COMPLETE 3+ VIEW COMPARISON:  None Available. FINDINGS: There is diffuse decreased bone mineralization. A pulse oximeter overlies the distal index finger obscuring portions. Degenerative changes including joint space narrowing, subchondral sclerosis and peripheral osteophytosis are moderate at the triscaphe joint; moderate to severe at the thumb carpometacarpal, thumb metacarpophalangeal, and thumb interphalangeal joints; and moderate to severe at the DIP joints and moderate at the PIP joints of the second through fifth fingers. No acute fracture is seen. No dislocation. IMPRESSION: Osteoarthritis as above. No acute fracture. Electronically Signed   By: Yvonne Kendall M.D.   On: 12/29/2021 18:12    DG Swallowing Func-Speech Pathology   Result Date: 01/02/2022 Table formatting from the original result was not included. Objective Swallowing Evaluation: Type of Study: MBS-Modified Barium Swallow Study  Patient Details Name: Chad [name] MRN: [id number] Date of Birth: 17-Feb-1932 Today's Date: 01/02/2022 Time: SLP Start Time (ACUTE ONLY): 1100 -SLP Stop Time (ACUTE ONLY): 1123 SLP Time Calculation (min) (ACUTE ONLY): 23 min Past Medical History: Past Medical History: Diagnosis Date  Arthritis   Heart murmur   asa child   Hypertension   Stroke [facility])  Past Surgical History: Past Surgical History: Procedure Laterality Date  EYE SURGERY Bilateral 2022   JOINT REPLACEMENT    RADIOLOGY WITH ANESTHESIA N/A 01/01/2022  Procedure: Angiogram;  Surgeon: Luanne Bras, MD;  Location: [place];  Service: Radiology;  Laterality: N/A;  TONSILLECTOMY    TOTAL KNEE ARTHROPLASTY Right 03/30/2013  Procedure: RIGHT TOTAL KNEE ARTHROPLASTY;  Surgeon: Tobi Bastos, MD;  Location: [facility];  Service: Orthopedics;  Laterality: Right;  TOTAL KNEE ARTHROPLASTY Left 10/09/2020  Procedure: TOTAL KNEE ARTHROPLASTY;  Surgeon: Paralee Cancel, MD;  Location: [facility];  Service: Orthopedics;  Laterality: Left;  70 mins HPI: 88 yo male presenting to the ED on 5/28 with L sided weakness, facial droop, slurred speech, and fall. 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 PMH including hypothyroidism chronic pain syndromes, CKD, HTN, arthritis, bil TKA.  Subjective: alert, friendly  Recommendations for follow up therapy are one component of a multi-disciplinary discharge planning process, led by the attending physician.  Recommendations may be updated based on patient status, additional functional criteria and insurance authorization. Assessment / Plan / Recommendation   01/02/2022  11:00 AM Clinical Impressions Clinical Impression Pt presents with oropharyngeal dysphagia characterized by reduced labial seal, impaired mastication, a pharyngeal delay and reduction in bolus cohesion, posterior bolus propulsion, tongue base retraction, anterior laryngeal movement, and pharyngeal constriction. He demonstrated left-sided anterior spillage, lingual residue, difficulty with A-P transport of solids, premature spillage to the valleculae and pyriform sinuses, and posterior pharyngeal wall residue. A liquid bolus was necessary to facilitate mastication  and A-P transport of a nutrigrain bar. Liquid washes reduced pharyngeal residue to a more functional level and a left head turn was effective in reducing pyriform sinus residue with thin liquids, but pt  exhibited notable difficulty maintaining this position. Penetration (PAS 3, 5) and aspiration (PAS 7, 8) were noted with thin liquids. Larger amounts of aspirate did trigger a cough which was mostly effective, but smaller quantities inconsistently triggered throat clearing which was inadequate for expulsion of material. No functional benefit was noted with prompted coughing. A dysphagia 1 diet with nectar thick liquids is recommended at this time. SLP will follow for dysphagia treatment. SLP Visit Diagnosis Dysphagia, oropharyngeal phase (R13.12) Impact on safety and function Mild aspiration risk     01/02/2022  11:00 AM Treatment Recommendations Treatment Recommendations Therapy as outlined in treatment plan below     01/02/2022  11:00 AM Prognosis Prognosis for Safe Diet Advancement Good   01/02/2022  11:00 AM Diet Recommendations SLP Diet Recommendations Dysphagia 1 (Puree) solids;Nectar thick liquid Liquid Administration via Cup;Straw Medication Administration Whole meds with puree Compensations Slow rate;Small sips/bites;Monitor for anterior loss;Follow solids with liquid Postural Changes Seated upright at 90 degrees     01/02/2022  11:00 AM Other Recommendations Other Recommendations Order thickener from pharmacy Follow Up Recommendations Acute inpatient rehab (3hours/day) Assistance recommended at discharge Frequent or constant Supervision/Assistance Functional Status Assessment Patient has had a recent decline in their functional status and demonstrates the ability to make significant improvements in function in a reasonable and predictable amount of time.   01/02/2022  11:00 AM Frequency and Duration  Speech Therapy Frequency (ACUTE ONLY) min 2x/week Treatment Duration 2 weeks     01/02/2022  11:00 AM Oral Phase Oral Phase Impaired Oral - Nectar Cup Left anterior bolus loss;Reduced posterior propulsion;Lingual/palatal residue;Delayed oral transit;Decreased bolus cohesion;Premature spillage Oral - Nectar Straw Left  anterior bolus loss;Reduced posterior propulsion;Lingual/palatal residue;Delayed oral transit;Decreased bolus cohesion;Premature spillage Oral - Thin Cup Left anterior bolus loss;Reduced posterior propulsion;Lingual/palatal residue;Delayed oral transit;Decreased bolus cohesion;Premature spillage Oral - Thin Straw Left anterior bolus loss;Reduced posterior propulsion;Lingual/palatal residue;Delayed oral transit;Decreased bolus cohesion;Premature spillage Oral - Puree Left anterior bolus loss;Reduced posterior propulsion;Lingual/palatal residue;Delayed oral transit;Decreased bolus cohesion;Weak lingual manipulation Oral - Mech Soft Left anterior bolus loss;Reduced posterior propulsion;Lingual/palatal residue;Delayed oral transit;Decreased bolus cohesion;Weak lingual manipulation;Impaired mastication Oral - Pill Left anterior bolus loss;Reduced posterior propulsion;Lingual/palatal residue;Delayed oral transit;Decreased bolus cohesion    01/02/2022  11:00 AM Pharyngeal Phase Pharyngeal Phase Impaired Pharyngeal- Thin Cup Reduced tongue base retraction;Reduced anterior laryngeal mobility;Delayed swallow initiation-pyriform sinuses;Reduced pharyngeal peristalsis;Penetration/Aspiration during swallow;Penetration/Apiration after swallow;Pharyngeal residue - valleculae;Pharyngeal residue - pyriform;Pharyngeal residue - posterior pharnyx Pharyngeal Material enters airway, remains ABOVE vocal cords and not ejected out;Material enters airway, CONTACTS cords and not ejected out;Material enters airway, passes BELOW cords and not ejected out despite cough attempt by patient;Material enters airway, passes BELOW cords without attempt by patient to eject out (silent aspiration) Pharyngeal- Thin Straw Reduced tongue base retraction;Reduced anterior laryngeal mobility;Delayed swallow initiation-pyriform sinuses;Reduced pharyngeal peristalsis;Penetration/Aspiration during swallow;Penetration/Apiration after swallow;Pharyngeal residue -  valleculae;Pharyngeal residue - pyriform;Pharyngeal residue - posterior pharnyx Pharyngeal Material enters airway, passes BELOW cords and not ejected out despite cough attempt by patient;Material enters airway, passes BELOW cords without attempt by patient to eject out (silent aspiration);Material enters airway, CONTACTS cords and then ejected out Pharyngeal- Puree Reduced tongue base retraction;Reduced anterior laryngeal mobility;Delayed swallow initiation-pyriform sinuses;Reduced pharyngeal peristalsis;Pharyngeal residue - valleculae;Pharyngeal residue - pyriform;Pharyngeal residue - posterior pharnyx Pharyngeal- Mechanical Soft Reduced tongue base retraction;Reduced anterior laryngeal  mobility;Delayed swallow initiation-pyriform sinuses;Reduced pharyngeal peristalsis;Pharyngeal residue - valleculae;Pharyngeal residue - pyriform;Pharyngeal residue - posterior pharnyx Pharyngeal- Pill Reduced tongue base retraction;Reduced anterior laryngeal mobility;Delayed swallow initiation-pyriform sinuses;Reduced pharyngeal peristalsis;Pharyngeal residue - valleculae;Pharyngeal residue - pyriform;Pharyngeal residue - posterior pharnyx    01/02/2022  11:00 AM Cervical Esophageal Phase  Cervical Esophageal Phase [facility] Shanika I. Hardin Negus, [place], [place] Office number [phone] Pager [phone] Horton Marshall 01/02/2022, 12:20 PM                      ECHOCARDIOGRAM COMPLETE   Result Date: 12/30/2021    ECHOCARDIOGRAM REPORT   Patient Name:   Chad [name] Date of Exam: 12/30/2021 Medical Rec #:  [id number]         Height:       66.0 in Accession #:    [id number]        Weight:       133.6 lb Date of Birth:  08/14/1931          BSA:          1.685 m Patient Age:    67 years          BP:           161/84 mmHg Patient Gender: M                 HR:           66 bpm. Exam Location:  Inpatient Procedure: 2D Echo Indications:    stroke  History:        Patient has prior history of Echocardiogram  examinations, most                 recent 04/25/2020. Chronic kidney disease.; Risk                 Factors:Hypertension and Dyslipidemia.  Sonographer:    Johny Chess RDCS Referring Phys: [place]  1. Left ventricular ejection fraction, by estimation, is 55 to 60%. The left ventricle has normal function. The left ventricle has no regional wall motion abnormalities. There is mild left ventricular hypertrophy. Left ventricular diastolic parameters are consistent with Grade I diastolic dysfunction (impaired relaxation).  2. Right ventricular systolic function is normal. The right ventricular size is normal.  3. Left atrial size was mildly dilated.  4. The mitral valve is normal in structure. Mild mitral valve regurgitation. No evidence of mitral stenosis.  5. The aortic valve is tricuspid. Aortic valve regurgitation is mild. Aortic valve sclerosis/calcification is present, without any evidence of aortic stenosis.  6. The inferior vena cava is normal in size with greater than 50% respiratory variability, suggesting right atrial pressure of 3 mmHg. FINDINGS  Left Ventricle: Left ventricular ejection fraction, by estimation, is 55 to 60%. The left ventricle has normal function. The left ventricle has no regional wall motion abnormalities. The left ventricular internal cavity size was normal in size. There is  mild left ventricular hypertrophy. Left ventricular diastolic parameters are consistent with Grade I diastolic dysfunction (impaired relaxation). Right Ventricle: The right ventricular size is normal. Right ventricular systolic function is normal. Left Atrium: Left atrial size was mildly dilated. Right Atrium: Right atrial size was normal in size. Pericardium: Trivial pericardial effusion is present. Mitral Valve: The mitral valve is normal in structure. Mild mitral annular calcification. Mild mitral valve regurgitation. No evidence of mitral valve stenosis. Tricuspid Valve: The  tricuspid valve is normal in structure. Tricuspid valve regurgitation  is trivial. No evidence of tricuspid stenosis. Aortic Valve: The aortic valve is tricuspid. Aortic valve regurgitation is mild. Aortic regurgitation PHT measures 400 msec. Aortic valve sclerosis/calcification is present, without any evidence of aortic stenosis. Pulmonic Valve: The pulmonic valve was normal in structure. Pulmonic valve regurgitation is trivial. No evidence of pulmonic stenosis. Aorta: The aortic root is normal in size and structure. Venous: The inferior vena cava is normal in size with greater than 50% respiratory variability, suggesting right atrial pressure of 3 mmHg. IAS/Shunts: No atrial level shunt detected by color flow Doppler.  LEFT VENTRICLE PLAX 2D LVIDd:         5.20 cm   Diastology LVIDs:         3.10 cm   LV e' medial:    4.50 cm/s LV PW:         1.20 cm   LV E/e' medial:  12.0 LV IVS:        1.10 cm   LV e' lateral:   4.20 cm/s LVOT diam:     2.10 cm   LV E/e' lateral: 12.9 LV SV:         80 LV SV Index:   47 LVOT Area:     3.46 cm  RIGHT VENTRICLE             IVC RV S prime:     15.70 cm/s  IVC diam: 1.00 cm TAPSE (M-mode): 2.0 cm LEFT ATRIUM             Index        RIGHT ATRIUM           Index LA diam:        3.40 cm 2.02 cm/m   RA Area:     16.30 cm LA Vol (A2C):   79.2 ml 47.01 ml/m  RA Volume:   39.80 ml  23.62 ml/m LA Vol (A4C):   47.5 ml 28.20 ml/m LA Biplane Vol: 63.0 ml 37.40 ml/m  AORTIC VALVE LVOT Vmax:   102.00 cm/s LVOT Vmean:  67.600 cm/s LVOT VTI:    0.230 m AI PHT:      400 msec  AORTA Ao Root diam: 3.70 cm Ao Asc diam:  3.10 cm MITRAL VALVE MV Area (PHT): 4.60 cm    SHUNTS MV Decel Time: 165 msec    Systemic VTI:  0.23 m MR Peak grad: 134.6 mmHg   Systemic Diam: 2.10 cm MR Mean grad: 82.0 mmHg MR Vmax:      580.00 cm/s MR Vmean:     416.0 cm/s MV E velocity: 54.00 cm/s MV A velocity: 96.40 cm/s MV E/A ratio:  0.56 Kirk Ruths MD Electronically signed by Kirk Ruths MD Signature  Date/Time: 12/30/2021/2:40:23 PM    Final     VAS US CAROTID   Result Date: 12/31/2021 Carotid Arterial Duplex Study Patient Name:  Chad [name]  Date of Exam:   12/30/2021 Medical Rec #: [id number]          Accession #:    [id number] Date of Birth: 04/23/1932           Patient Gender: M Patient Age:   39 years Exam Location:  [facility] Procedure:      VAS US CAROTID Referring Phys: Nyoka Lint DOUTOVA --------------------------------------------------------------------------------  Indications:       CVA. Risk Factors:      Hypertension. Comparison Study:  No prior study Performing Technologist: Maudry Mayhew MHA, RDMS, RVT, RDCS  Examination Guidelines:  A complete evaluation includes B-mode imaging, spectral Doppler, color Doppler, and power Doppler as needed of all accessible portions of each vessel. Bilateral testing is considered an integral part of a complete examination. Limited examinations for reoccurring indications may be performed as noted.  Right Carotid Findings: +----------+-------+--------+--------+-------------------------------+---------+           PSV    EDV cm/sStenosisPlaque Description             Comments            cm/s                                                            +----------+-------+--------+--------+-------------------------------+---------+ CCA Prox  54     7               heterogenous and irregular               +----------+-------+--------+--------+-------------------------------+---------+ CCA Distal58     9               smooth and heterogenous                  +----------+-------+--------+--------+-------------------------------+---------+ ICA Prox  132    19              smooth, heterogenous and       Shadowing                                  calcific                                 +----------+-------+--------+--------+-------------------------------+---------+ ICA Distal78     15                                                        +----------+-------+--------+--------+-------------------------------+---------+ ECA       126                                                             +----------+-------+--------+--------+-------------------------------+---------+ +----------+--------+-------+----------------+-------------------+           PSV cm/sEDV cmsDescribe        Arm Pressure (mmHG) +----------+--------+-------+----------------+-------------------+ [id number]            Multiphasic, WNL                    +----------+--------+-------+----------------+-------------------+ +---------+--------+--+--------+-+---------+ VertebralPSV cm/s51EDV cm/s5Antegrade +---------+--------+--+--------+-+---------+  Left Carotid Findings: +----------+--------+--------+--------+------------------------------+---------+           PSV cm/sEDV cm/sStenosisPlaque Description            Comments  +----------+--------+--------+--------+------------------------------+---------+ CCA Prox  89                      heterogenous and irregular    tortuous  +----------+--------+--------+--------+------------------------------+---------+ CCA Distal70  8                                                       +----------+--------+--------+--------+------------------------------+---------+ ICA Prox  49      12              heterogenous, irregular and   Shadowing                                   calcific                                +----------+--------+--------+--------+------------------------------+---------+ ICA Distal98      18                                                      +----------+--------+--------+--------+------------------------------+---------+ ECA       67      10                                                      +----------+--------+--------+--------+------------------------------+---------+  +----------+--------+--------+----------------+-------------------+           PSV cm/sEDV cm/sDescribe        Arm Pressure (mmHG) +----------+--------+--------+----------------+-------------------+ [id number]             Multiphasic, WNL                    +----------+--------+--------+----------------+-------------------+ +---------+--------+--+--------+--+---------+ VertebralPSV cm/s94EDV cm/s14Antegrade +---------+--------+--+--------+--+---------+   Summary: Right Carotid: Velocities in the right ICA are consistent with a 1-39% stenosis. Left Carotid: Velocities in the left ICA are consistent with a 1-39% stenosis. Vertebrals:  Bilateral vertebral arteries demonstrate antegrade flow. Subclavians: Normal flow hemodynamics were seen in bilateral subclavian              arteries. *See table(s) above for measurements and observations.  Electronically signed by Antony Contras MD on 12/31/2021 at 3:59:42 PM.    Final    Imaging Results          Subjective: Patient seen and examined at the bedside this morning.  Medically stable for discharge today.   Discharge Exam:     Vitals:    01/02/22 1130 01/02/22 1200  BP: (!) 156/66 (!) 149/69  Pulse:   88  Resp:   (!) 22  Temp:      SpO2:   100%          Vitals:    01/02/22 0930 01/02/22 1000 01/02/22 1130 01/02/22 1200  BP: (!) 134/59 134/81 (!) 156/66 (!) 149/69  Pulse: (!) 105 (!) 109   88  Resp: 19 (!) 22   (!) 22  Temp:          TempSrc:          SpO2: 100% 100%   100%  Weight:          Height:  General: Pt is alert, awake, not in acute distress Cardiovascular: RRR, S1/S2 +, no rubs, no gallops Respiratory: CTA bilaterally, no wheezing, no rhonchi Abdominal: Soft, NT, ND, bowel sounds + Extremities: no edema, no cyanosis, left hemiparesis       The results of significant diagnostics from this hospitalization (including imaging, microbiology, ancillary and laboratory) are listed below for reference.        Microbiology:        Recent Results (from the past 240 hour(s))  MRSA Next Gen by PCR, Nasal     Status: None    Collection Time: 01/01/22  2:40 PM    Specimen: Nasal Mucosa; Nasal Swab  Result Value Ref Range Status    MRSA by PCR Next Gen NOT DETECTED NOT DETECTED Final      Comment: (NOTE) The GeneXpert MRSA Assay (FDA approved for NASAL specimens only), is one component of a comprehensive MRSA colonization surveillance program. It is not intended to diagnose MRSA infection nor to guide or monitor treatment for MRSA infections. Test performance is not FDA approved in patients less than 17 years old. Performed at [facility], [place] [address], [place], [place] [address]        Labs: BNP (last 3 results) Recent Labs (within last 365 days)  No results for input(s): BNP in the last 8760 hours.   Basic Metabolic Panel: Last Labs         Recent Labs  Lab 12/29/21 1700 12/29/21 1722 12/31/21 0357 01/02/22 0548  NA 140 141 145 147*  K 4.4 4.4 3.8 4.1  CL 108 109 114* 123*  CO2 22  --  22 17*  GLUCOSE 119* 118* 110* 123*  BUN 35* 36* 27* 25*  CREATININE 1.46* 1.40* 1.33* 1.45*  CALCIUM 9.9  --  8.9 8.1*  MG 1.9  --   --   --   PHOS 3.5  --   --   --       Liver Function Tests: Last Labs      Recent Labs  Lab 12/29/21 1700  AST 25  ALT 12  ALKPHOS 56  BILITOT 0.8  PROT 7.6  ALBUMIN 3.4*      Last Labs   No results for input(s): LIPASE, AMYLASE in the last 168 hours.   Last Labs   No results for input(s): AMMONIA in the last 168 hours.   CBC: Last Labs         Recent Labs  Lab 12/29/21 1700 12/29/21 1722 12/31/21 0357 01/02/22 0548  WBC 6.9  --  6.4 8.3  NEUTROABS 3.8  --   --  5.2  HGB 11.6* 11.6* 10.0* 8.2*  HCT 35.6* 34.0* 30.7* 24.7*  MCV 86.2  --  84.8 85.2  PLT 197  --  170 154      Cardiac Enzymes: Last Labs       Recent Labs  Lab 12/29/21 1700 12/30/21 0412  CKTOTAL 328 227      BNP: Last Labs   Invalid  input(s): POCBNP   CBG: Last Labs      Recent Labs  Lab 12/29/21 1658  GLUCAP 112*      D-Dimer Recent Labs (last 2 labs)   No results for input(s): DDIMER in the last 72 hours.   Hgb A1c Recent Labs (last 2 labs)   No results for input(s): HGBA1C in the last 72 hours.   Lipid Profile Recent Labs (last 2 labs)   No results for  input(s): CHOL, HDL, LDLCALC, TRIG, CHOLHDL, LDLDIRECT in the last 72 hours.   Thyroid function studies  Recent Labs (last 2 labs)   No results for input(s): TSH, T4TOTAL, T3FREE, THYROIDAB in the last 72 hours.   Invalid input(s): FREET3   Anemia work up [employer] (last 2 labs)   No results for input(s): VITAMINB12, FOLATE, FERRITIN, TIBC, IRON, RETICCTPCT in the last 72 hours.   Urinalysis Labs (Brief)          Component Value Date/Time    COLORURINE YELLOW 12/29/2021 2233    APPEARANCEUR CLEAR 12/29/2021 2233    LABSPEC 1.011 12/29/2021 2233    PHURINE 5.0 12/29/2021 2233    GLUCOSEU NEGATIVE 12/29/2021 2233    HGBUR SMALL (A) 12/29/2021 2233    BILIRUBINUR NEGATIVE 12/29/2021 2233    KETONESUR NEGATIVE 12/29/2021 2233    PROTEINUR 30 (A) 12/29/2021 2233    UROBILINOGEN 0.2 03/23/2013 0908    NITRITE NEGATIVE 12/29/2021 2233    LEUKOCYTESUR NEGATIVE 12/29/2021 2233      Sepsis Labs Last Labs   Invalid input(s): PROCALCITONIN,  WBC,  LACTICIDVEN   Microbiology        Recent Results (from the past 240 hour(s))  MRSA Next Gen by PCR, Nasal     Status: None    Collection Time: 01/01/22  2:40 PM    Specimen: Nasal Mucosa; Nasal Swab  Result Value Ref Range Status    MRSA by PCR Next Gen NOT DETECTED NOT DETECTED Final      Comment: (NOTE) The GeneXpert MRSA Assay (FDA approved for NASAL specimens only), is one component of a comprehensive MRSA colonization surveillance program. It is not intended to diagnose MRSA infection nor to guide or monitor treatment for MRSA infections. Test performance is not FDA approved in patients  less than 8 years old. Performed at [facility], [place] [address], [place], [place] [address]        Please note: You were cared for by a hospitalist during your hospital stay. Once you are discharged, your primary care physician will handle any further medical issues. Please note that NO REFILLS for any discharge medications will be authorized once you are discharged, as it is imperative that you return to your primary care physician (or establish a relationship with a primary care physician if you do not have one) for your post hospital discharge needs so that they can reassess your need for medications and monitor your lab values.       Time coordinating discharge: 40 minutes   SIGNED:     Shelly Coss, MD       [facility] 01/02/2022, 1:39 PM Pager [phone]   If 7PM-7AM, please contact night-coverage [email or web address] Password [email or web address]          Note Details  Valda Lamb, MD File Time 01/02/2022  1:41 PM  Author Type Physician Status Signed  Last Editor Chad Coss, [place] # [phone] Admit Date 12/29/2021

## 2022-01-02 NOTE — Progress Notes (Signed)
Inpatient Rehab Admissions Coordinator:   I have insurance approval and a bed available for pt to admit to CIR today.  Dr. Tawanna Solo in agreement.  Pt/family and TOC aware.  Discussed updated goals of w/c level min assist at d/c and daughter confirms that between her and her brother they will be able to provide this.    Shann Medal, PT, DPT Admissions Coordinator [phone] 01/02/22  12:05 PM

## 2022-01-02 NOTE — Progress Notes (Signed)
Referring Physician(s): Code Stroke  Supervising Physician: Luanne Bras  Patient Status:  [facility] - In-pt  Chief Complaint: Code Stroke  Subjective: Patient lying in bed.  Ruminating on events of fall/stroke at home and calling his daughter.  Speech overall clear.  Still with left-sided facial droop and no movement in LU/LLE.   Allergies: Patient has no known allergies.  Medications: Prior to Admission medications   Medication Sig Start Date End Date Taking? Authorizing Provider  amLODipine (NORVASC) 5 MG tablet Take 5 mg by mouth daily. 11/21/21  Yes [provider]  atenolol (TENORMIN) 100 MG tablet Take 100 mg by mouth every morning.   Yes [provider]  HYDROcodone-acetaminophen (NORCO) 7.5-325 MG tablet Take 1 tablet by mouth 3 (three) times daily as needed for moderate pain. 11/25/21  Yes [provider]  lovastatin (MEVACOR) 40 MG tablet Take 40 mg by mouth at bedtime.   Yes [provider]  tamsulosin (FLOMAX) 0.4 MG CAPS capsule Take 0.4 mg by mouth daily.   Yes [provider]  docusate sodium (COLACE) 100 MG capsule Take 1 capsule (100 mg total) by mouth 2 (two) times daily. Patient not taking: Reported on 12/29/2021 10/10/20   Irving Copas, PA-C  ferrous sulfate 325 (65 FE) MG tablet Take 1 tablet (325 mg total) by mouth 2 (two) times daily with a meal for 14 days. 10/10/20 10/24/20  Irving Copas, PA-C  HYDROcodone-acetaminophen (NORCO/VICODIN) 5-325 MG tablet Take 1-2 tablets by mouth every 6 (six) hours as needed for moderate pain (pain score 4-6). Patient not taking: Reported on 12/29/2021 10/10/20   Irving Copas, PA-C  methocarbamol (ROBAXIN) 500 MG tablet Take 1 tablet (500 mg total) by mouth every 6 (six) hours as needed for muscle spasms. Patient not taking: Reported on 12/29/2021 10/10/20   Irving Copas, PA-C  polyethylene glycol (MIRALAX / GLYCOLAX) 17 g packet Take 17 g by mouth 2 (two) times  daily. Patient not taking: Reported on 12/29/2021 10/10/20   Irving Copas, PA-C     Vital Signs: BP (!) 131/58   Pulse 99   Temp 99 F (37.2 C) (Oral)   Resp (!) 21   Ht 5\' 1"  (1.549 m)   Wt 137 lb 2 oz (62.2 kg)   SpO2 99%   BMI 25.91 kg/m   Physical Exam Vitals and nursing note reviewed.  Constitutional:      General: He is not in acute distress.    Appearance: Normal appearance. He is not ill-appearing.  HENT:     Mouth/Throat:     Mouth: Mucous membranes are moist.     Pharynx: Oropharynx is clear.  Cardiovascular:     Rate and Rhythm: Normal rate and regular rhythm.  Pulmonary:     Effort: Pulmonary effort is normal. No respiratory distress.  Skin:    General: Skin is warm and dry.  Neurological:     General: No focal deficit present.     Mental Status: He is alert and oriented to person, place, and time. Mental status is at baseline.  Psychiatric:        Mood and Affect: Mood normal.        Behavior: Behavior normal.        Thought Content: Thought content normal.        Judgment: Judgment normal.   Alert, NAD Neuro: alert, cognitively intact, conversing easily.  Left-sided facial droop, no movement Ieft upper or lower extremitiy.  Groin site  intact, clean, dry. No hematoma.   Imaging: CT ANGIO HEAD NECK W WO CM  Result Date: 12/30/2021 CLINICAL DATA:  Stroke/TIA, determine embolic source EXAM: CT ANGIOGRAPHY HEAD AND NECK TECHNIQUE: Multidetector CT imaging of the head and neck was performed using the standard protocol during bolus administration of intravenous contrast. Multiplanar CT image reconstructions and MIPs were obtained to evaluate the vascular anatomy. Carotid stenosis measurements (when applicable) are obtained utilizing NASCET criteria, using the distal internal carotid diameter as the denominator. RADIATION DOSE REDUCTION: This exam was performed according to the departmental dose-optimization program which includes automated exposure control,  adjustment of the mA and/or kV according to patient size and/or use of iterative reconstruction technique. CONTRAST:  72mL OMNIPAQUE IOHEXOL 350 MG/ML SOLN COMPARISON:  Same day MRI/MRA. FINDINGS: CT HEAD FINDINGS Brain: Small right MCA territory infarcts better characterized on same day MRI. No evidence of acute hemorrhage, mass effect, midline shift, extra-axial fluid collection, or hydrocephalus. Patchy white matter hypodensities, nonspecific but compatible with chronic microvascular ischemic disease. Vascular: Detailed below. Skull: No acute fracture. Sinuses: Mild paranasal sinus mucosal thickening. Orbits: No acute orbital findings. Review of the MIP images confirms the above findings CTA NECK FINDINGS Aortic arch: Calcific atherosclerosis of the aorta and great vessel origins. Ectatic right subclavian artery. Right carotid system: Atherosclerosis at the carotid bifurcation with approximately 80% stenosis. Left carotid system: Atherosclerosis at the carotid bifurcation without greater than 50% stenosis. Vertebral arteries: Potentially severe stenosis of the right vertebral artery origin due to calcific atherosclerosis. Otherwise, vertebral arteries are patent without significant (greater than 50%) stenosis. Left dominant. Skeleton: Severe multilevel degenerative change. Other neck: No acute findings. Upper chest: Clear sinuses. Review of the MIP images confirms the above findings CTA HEAD FINDINGS Anterior circulation: Bilateral intracranial ICAs are patent with mild narrowing due to calcific atherosclerosis. Bilateral M1 MCAs and left M2 MCAs are patent. Apparent occlusion of a right M2 MCA branch in a region of calcification. Bilateral ACAs are patent. Posterior circulation: Left dominant intradural vertebral artery. Right intradural vertebral artery makes very small contribution to the basilar artery. Basilar artery and bilateral posterior cerebral arteries are patent without proximal hemodynamically  significant stenosis. Venous sinuses: As permitted by contrast timing, patent. Anatomic variants: Detailed above. Review of the MIP images confirms the above findings IMPRESSION: CTA: 1. 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 2. Extensive calcific atherosclerosis at the right carotid bifurcation with approximately 80% stenosis of the proximal ICA. 3. 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 CT head: Small right MCA territory infarcts better characterized on same day MRI. No evidence of progressive mass effect or acute hemorrhage. Findings discussed with Dr. Erlinda Hong via telephone at 12:50 p.m. Electronically Signed   By: Margaretha Sheffield M.D.   On: 12/30/2021 13:00   DG Elbow Complete Left  Result Date: 12/29/2021 CLINICAL DATA:  Fall.  Left elbow pain and bruising. EXAM: LEFT ELBOW - COMPLETE 3+ VIEW COMPARISON:  None Available. FINDINGS: No fracture or dislocation. Narrowed radiocapitellar joint. Marginal osteophytes project from the base of the radial head. No joint effusion. There is soft tissue edema posteriorly. IMPRESSION: No fracture or dislocation. Electronically Signed   By: Lajean Manes M.D.   On: 12/29/2021 18:12   CT HEAD WO CONTRAST  Result Date: 12/29/2021 CLINICAL DATA:  Neurologic deficit EXAM: CT HEAD WITHOUT CONTRAST TECHNIQUE: Contiguous axial  images were obtained from the base of the skull through the vertex without intravenous contrast. RADIATION DOSE REDUCTION: This exam was performed according to the departmental dose-optimization program which includes automated exposure control, adjustment of the mA and/or kV according to patient size and/or use of iterative reconstruction technique. COMPARISON:  None Available. FINDINGS: Brain: Confluent hypodensities throughout the periventricular  white matter are most consistent with age-indeterminate small vessel ischemic changes, likely chronic. Focal hypodensities in the left basal ganglia and right frontal periventricular white matter consistent with chronic lacunar infarcts. Age-indeterminate lacunar infarct is seen within the right basal ganglia image 18/3. No other signs of acute infarct or hemorrhage. Lateral ventricles and midline structures are otherwise unremarkable. No acute extra-axial fluid collections. No mass effect. Vascular: No hyperdense vessel or unexpected calcification. Skull: Normal. Negative for fracture or focal lesion. Sinuses/Orbits: No acute finding. Other: None. IMPRESSION: 1. Age indeterminate lacunar infarct within the right basal ganglia. 2. Age-indeterminate small-vessel ischemic changes throughout the periventricular white matter, favor chronic. 3. Chronic left basal ganglia and right frontal white matter lacunar infarcts as above. 4. No acute hemorrhage. Electronically Signed   By: Randa Ngo M.D.   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 Multiplanar CT image reconstructions were also generated. RADIATION DOSE REDUCTION: This exam was performed according to the departmental dose-optimization program which includes automated exposure control, adjustment of the mA and/or kV according to patient size and/or use of iterative reconstruction technique. COMPARISON:  None Available. FINDINGS: Alignment: Mild degenerative anterolisthesis of C2 and C3. Otherwise alignment is anatomic. Skull base and vertebrae: No acute fracture. No primary bone lesion or focal pathologic process. Soft tissues and spinal canal: No prevertebral fluid or swelling. No visible canal hematoma. Disc levels: Partial bony  fusion across the disc spaces at C3-4 and C4-5. There is severe multilevel spondylosis throughout the remainder of the cervical spine, greatest at C5-6 and C6-7. Diffuse facet hypertrophy. Upper chest: Airway is patent. Lung apices are clear. Prominent atherosclerosis of the aortic arch. Other: Reconstructed images demonstrate no additional findings. IMPRESSION: 1. Extensive multilevel cervical degenerative changes. No acute fracture. Electronically Signed   By: Randa Ngo M.D.   On: 12/29/2021 17:38   MR ANGIO HEAD WO CONTRAST  Result Date: 12/30/2021 CLINICAL DATA:  There are deficit with acute stroke suspected EXAM: MRI HEAD WITHOUT CONTRAST MRA HEAD WITHOUT CONTRAST TECHNIQUE: Multiplanar, multi-echo pulse sequences of the brain and surrounding structures were acquired without intravenous contrast. Angiographic images of the Circle of Willis were acquired using MRA technique without intravenous contrast. COMPARISON:  Head CT from yesterday FINDINGS: MRI HEAD FINDINGS Brain: Cluster of small acute infarcts in the right insular cortex, right frontal cortex, and deep right cerebral white matter. These are in a MCA distribution. Background of advanced chronic small vessel ischemia with confluent gliosis in the hemispheric white matter. Chronic lacunar infarcts in the deep cerebrum. Small remote left cerebral infarct. Gradient signal at the lower right sylvian fissure with there is calcification along the MCA branches by CT. Vascular: Major flow voids are preserved Skull and upper cervical spine: Normal marrow signal Sinuses/Orbits: Bilateral cataract resection MRA HEAD FINDINGS Anterior circulation: High-grade narrowing involving the upper right M2 branch with there is a gradient hypointensity noted above. Elsewhere vessels are smoothly contoured and widely patent. Posterior  circulation: Vertebrobasilar arteries are smoothly contoured and widely patent. IMPRESSION: Brain MRI: 1. Cluster of small cortical and  white matter infarcts within the right MCA distribution. 2. Background of advanced chronic small vessel ischemia. Intracranial MRA: Focal high-grade narrowing at the right M2 level where there is calcification by CT. This could reflect calcified plaque or a calcified embolus. If an embolus, the proximal nature and degree of mild infarct with suggest additional territory of risk. Electronically Signed   By: Jorje Guild M.D.   On: 12/30/2021 07:53   MR BRAIN WO CONTRAST  Result Date: 12/30/2021 CLINICAL DATA:  There are deficit with acute stroke suspected EXAM: MRI HEAD WITHOUT CONTRAST MRA HEAD WITHOUT CONTRAST TECHNIQUE: Multiplanar, multi-echo pulse sequences of the brain and surrounding structures were acquired without intravenous contrast. Angiographic images of the Circle of Willis were acquired using MRA technique without intravenous contrast. COMPARISON:  Head CT from yesterday FINDINGS: MRI HEAD FINDINGS Brain: Cluster of small acute infarcts in the right insular cortex, right frontal cortex, and deep right cerebral white matter. These are in a MCA distribution. Background of advanced chronic small vessel ischemia with confluent gliosis in the hemispheric white matter. Chronic lacunar infarcts in the deep cerebrum. Small remote left cerebral infarct. Gradient signal at the lower right sylvian fissure with there is calcification along the MCA branches by CT. Vascular: Major flow voids are preserved Skull and upper cervical spine: Normal marrow signal Sinuses/Orbits: Bilateral cataract resection MRA HEAD FINDINGS Anterior circulation: High-grade narrowing involving the upper right M2 branch with there is a gradient hypointensity noted above. Elsewhere vessels are smoothly contoured and widely patent. Posterior circulation: Vertebrobasilar arteries are smoothly contoured and widely patent. IMPRESSION: Brain MRI: 1. Cluster of small cortical and white matter infarcts within the right MCA distribution. 2.  Background of advanced chronic small vessel ischemia. Intracranial MRA: Focal high-grade narrowing at the right M2 level where there is calcification by CT. This could reflect calcified plaque or a calcified embolus. If an embolus, the proximal nature and degree of mild infarct with suggest additional territory of risk. Electronically Signed   By: Jorje Guild M.D.   On: 12/30/2021 07:53   DG Chest Portable 1 View  Result Date: 12/29/2021 CLINICAL DATA:  CVA. Fell last night. Family concerned patient injured left side EXAM: PORTABLE CHEST 1 VIEW COMPARISON:  Chest two views 03/23/2013 FINDINGS: Cardiac silhouette is mildly enlarged. Moderate calcifications within the aortic arch and descending thoracic aorta. Minimal bibasilar horizontal linear chronic scarring is unchanged. No focal airspace opacity to indicate pneumonia. No pleural effusion or pneumothorax. Moderate multilevel degenerative disc changes of the thoracic spine. IMPRESSION: Mild stable cardiomegaly. No acute lung process. Electronically Signed   By: Yvonne Kendall M.D.   On: 12/29/2021 19:19   DG Hand Complete Left  Result Date: 12/29/2021 CLINICAL DATA:  Fall.  Bruising to posterior left hand. EXAM: LEFT HAND - COMPLETE 3+ VIEW COMPARISON:  None Available. FINDINGS: There is diffuse decreased bone mineralization. A pulse oximeter overlies the distal index finger obscuring portions. Degenerative changes including joint space narrowing, subchondral sclerosis and peripheral osteophytosis are moderate at the triscaphe joint; moderate to severe at the thumb carpometacarpal, thumb metacarpophalangeal, and thumb interphalangeal joints; and moderate to severe at the DIP joints and moderate at the PIP joints of the second through fifth fingers. No acute fracture is seen. No dislocation. IMPRESSION: Osteoarthritis as above. No acute fracture. Electronically Signed   By: Yvonne Kendall M.D.   On: 12/29/2021 18:12  ECHOCARDIOGRAM COMPLETE  Result  Date: 12/30/2021    ECHOCARDIOGRAM REPORT   Patient Name:   BREANNA MCDANIEL Date of Exam: 12/30/2021 Medical Rec #:  [id number]         Height:       66.0 in Accession #:    [id number]        Weight:       133.6 lb Date of Birth:  09/03/1931          BSA:          1.685 m Patient Age:    84 years          BP:           161/84 mmHg Patient Gender: M                 HR:           66 bpm. Exam Location:  Inpatient Procedure: 2D Echo Indications:    stroke  History:        Patient has prior history of Echocardiogram examinations, most                 recent 04/25/2020. Chronic kidney disease.; Risk                 Factors:Hypertension and Dyslipidemia.  Sonographer:    Johny Chess RDCS Referring Phys: [place]  1. Left ventricular ejection fraction, by estimation, is 55 to 60%. The left ventricle has normal function. The left ventricle has no regional wall motion abnormalities. There is mild left ventricular hypertrophy. Left ventricular diastolic parameters are consistent with Grade I diastolic dysfunction (impaired relaxation).  2. Right ventricular systolic function is normal. The right ventricular size is normal.  3. Left atrial size was mildly dilated.  4. The mitral valve is normal in structure. Mild mitral valve regurgitation. No evidence of mitral stenosis.  5. The aortic valve is tricuspid. Aortic valve regurgitation is mild. Aortic valve sclerosis/calcification is present, without any evidence of aortic stenosis.  6. The inferior vena cava is normal in size with greater than 50% respiratory variability, suggesting right atrial pressure of 3 mmHg. FINDINGS  Left Ventricle: Left ventricular ejection fraction, by estimation, is 55 to 60%. The left ventricle has normal function. The left ventricle has no regional wall motion abnormalities. The left ventricular internal cavity size was normal in size. There is  mild left ventricular hypertrophy. Left ventricular diastolic parameters  are consistent with Grade I diastolic dysfunction (impaired relaxation). Right Ventricle: The right ventricular size is normal. Right ventricular systolic function is normal. Left Atrium: Left atrial size was mildly dilated. Right Atrium: Right atrial size was normal in size. Pericardium: Trivial pericardial effusion is present. Mitral Valve: The mitral valve is normal in structure. Mild mitral annular calcification. Mild mitral valve regurgitation. No evidence of mitral valve stenosis. Tricuspid Valve: The tricuspid valve is normal in structure. Tricuspid valve regurgitation is trivial. No evidence of tricuspid stenosis. Aortic Valve: The aortic valve is tricuspid. Aortic valve regurgitation is mild. Aortic regurgitation PHT measures 400 msec. Aortic valve sclerosis/calcification is present, without any evidence of aortic stenosis. Pulmonic Valve: The pulmonic valve was normal in structure. Pulmonic valve regurgitation is trivial. No evidence of pulmonic stenosis. Aorta: The aortic root is normal in size and structure. Venous: The inferior vena cava is normal in size with greater than 50% respiratory variability, suggesting right atrial pressure of 3 mmHg. IAS/Shunts: No atrial level shunt detected by color flow  Doppler.  LEFT VENTRICLE PLAX 2D LVIDd:         5.20 cm   Diastology LVIDs:         3.10 cm   LV e' medial:    4.50 cm/s LV PW:         1.20 cm   LV E/e' medial:  12.0 LV IVS:        1.10 cm   LV e' lateral:   4.20 cm/s LVOT diam:     2.10 cm   LV E/e' lateral: 12.9 LV SV:         80 LV SV Index:   47 LVOT Area:     3.46 cm  RIGHT VENTRICLE             IVC RV S prime:     15.70 cm/s  IVC diam: 1.00 cm TAPSE (M-mode): 2.0 cm LEFT ATRIUM             Index        RIGHT ATRIUM           Index LA diam:        3.40 cm 2.02 cm/m   RA Area:     16.30 cm LA Vol (A2C):   79.2 ml 47.01 ml/m  RA Volume:   39.80 ml  23.62 ml/m LA Vol (A4C):   47.5 ml 28.20 ml/m LA Biplane Vol: 63.0 ml 37.40 ml/m  AORTIC VALVE  LVOT Vmax:   102.00 cm/s LVOT Vmean:  67.600 cm/s LVOT VTI:    0.230 m AI PHT:      400 msec  AORTA Ao Root diam: 3.70 cm Ao Asc diam:  3.10 cm MITRAL VALVE MV Area (PHT): 4.60 cm    SHUNTS MV Decel Time: 165 msec    Systemic VTI:  0.23 m MR Peak grad: 134.6 mmHg   Systemic Diam: 2.10 cm MR Mean grad: 82.0 mmHg MR Vmax:      580.00 cm/s MR Vmean:     416.0 cm/s MV E velocity: 54.00 cm/s MV A velocity: 96.40 cm/s MV E/A ratio:  0.56 Kirk Ruths MD Electronically signed by Kirk Ruths MD Signature Date/Time: 12/30/2021/2:40:23 PM    Final    VAS US CAROTID  Result Date: 12/31/2021 Carotid Arterial Duplex Study Patient Name:  AZARIA BARTELL  Date of Exam:   12/30/2021 Medical Rec #: [id number]          Accession #:    [id number] Date of Birth: 08/16/1931           Patient Gender: M Patient Age:   39 years Exam Location:  [facility] Procedure:      VAS US CAROTID Referring Phys: Nyoka Lint DOUTOVA --------------------------------------------------------------------------------  Indications:       CVA. Risk Factors:      Hypertension. Comparison Study:  No prior study Performing Technologist: Maudry Mayhew MHA, RDMS, RVT, RDCS  Examination Guidelines: A complete evaluation includes B-mode imaging, spectral Doppler, color Doppler, and power Doppler as needed of all accessible portions of each vessel. Bilateral testing is considered an integral part of a complete examination. Limited examinations for reoccurring indications may be performed as noted.  Right Carotid Findings: +----------+-------+--------+--------+-------------------------------+---------+           PSV    EDV cm/sStenosisPlaque Description             Comments            cm/s                                                            +----------+-------+--------+--------+-------------------------------+---------+  CCA Prox  54     7               heterogenous and irregular                +----------+-------+--------+--------+-------------------------------+---------+ CCA Distal58     9               smooth and heterogenous                  +----------+-------+--------+--------+-------------------------------+---------+ ICA Prox  132    19              smooth, heterogenous and       Shadowing                                  calcific                                 +----------+-------+--------+--------+-------------------------------+---------+ ICA Distal78     15                                                       +----------+-------+--------+--------+-------------------------------+---------+ ECA       126                                                             +----------+-------+--------+--------+-------------------------------+---------+ +----------+--------+-------+----------------+-------------------+           PSV cm/sEDV cmsDescribe        Arm Pressure (mmHG) +----------+--------+-------+----------------+-------------------+ [id number]            Multiphasic, WNL                    +----------+--------+-------+----------------+-------------------+ +---------+--------+--+--------+-+---------+ VertebralPSV cm/s51EDV cm/s5Antegrade +---------+--------+--+--------+-+---------+  Left Carotid Findings: +----------+--------+--------+--------+------------------------------+---------+           PSV cm/sEDV cm/sStenosisPlaque Description            Comments  +----------+--------+--------+--------+------------------------------+---------+ CCA Prox  89                      heterogenous and irregular    tortuous  +----------+--------+--------+--------+------------------------------+---------+ CCA Distal70      8                                                       +----------+--------+--------+--------+------------------------------+---------+ ICA Prox  49      12              heterogenous, irregular and    Shadowing                                   calcific                                +----------+--------+--------+--------+------------------------------+---------+  ICA Distal98      18                                                      +----------+--------+--------+--------+------------------------------+---------+ ECA       67      10                                                      +----------+--------+--------+--------+------------------------------+---------+ +----------+--------+--------+----------------+-------------------+           PSV cm/sEDV cm/sDescribe        Arm Pressure (mmHG) +----------+--------+--------+----------------+-------------------+ [id number]             Multiphasic, WNL                    +----------+--------+--------+----------------+-------------------+ +---------+--------+--+--------+--+---------+ VertebralPSV cm/s94EDV cm/s14Antegrade +---------+--------+--+--------+--+---------+   Summary: Right Carotid: Velocities in the right ICA are consistent with a 1-39% stenosis. Left Carotid: Velocities in the left ICA are consistent with a 1-39% stenosis. Vertebrals:  Bilateral vertebral arteries demonstrate antegrade flow. Subclavians: Normal flow hemodynamics were seen in bilateral subclavian              arteries. *See table(s) above for measurements and observations.  Electronically signed by Antony Contras MD on 12/31/2021 at 3:59:42 PM.    Final     Labs:  CBC: Recent Labs    12/29/21 1700 12/29/21 1722 12/31/21 0357 01/02/22 0548  WBC 6.9  --  6.4 8.3  HGB 11.6* 11.6* 10.0* 8.2*  HCT 35.6* 34.0* 30.7* 24.7*  PLT 197  --  170 154    COAGS: Recent Labs    12/29/21 1700  INR 1.4*  APTT 38*    BMP: Recent Labs    12/29/21 1700 12/29/21 1722 12/31/21 0357 01/02/22 0548  NA 140 141 145 147*  K 4.4 4.4 3.8 4.1  CL 108 109 114* 123*  CO2 22  --  22 17*  GLUCOSE 119* 118* 110* 123*  BUN 35* 36* 27* 25*   CALCIUM 9.9  --  8.9 8.1*  CREATININE 1.46* 1.40* 1.33* 1.45*  GFRNONAA 46*  --  51* 46*    LIVER FUNCTION TESTS: Recent Labs    12/29/21 1700  BILITOT 0.8  AST 25  ALT 12  ALKPHOS 56  PROT 7.6  ALBUMIN 3.4*    Assessment and Plan: R MCA infarct s/p angioplasty of 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  Patient stable after intervention yesterday.  Daughter at bedside.  Patient being considered for CIR.  Groin site intact. No new neuro deficits.  On Brilinta 90mg  BID, aspirin 81mg  daily.   Electronically Signed: Docia Barrier, PA 01/02/2022, 9:08 AM   I spent a total of 15 Minutes at the the patient's bedside AND on the patient's hospital floor or unit, greater than 50% of which was counseling/coordinating care for stroke.

## 2022-01-02 NOTE — Progress Notes (Signed)
PMR Admission Coordinator Pre-Admission Assessment   Patient: Chad [name] is an 88 y.o., male MRN: [id number] DOB: 12/31/1931 Height: 5' 1"  (154.9 cm) Weight: 62.2 kg   Insurance Information HMO: yes    PPO:      PCP:      IPA:      80/20:      OTHER:  PRIMARY: [employer] Medicare      Policy#: [id number]      Subscriber: pt CM Name: Eddie Dibbles      Phone#: [phone]     Fax#: [phone] Pre-Cert#: [id number] auth for CIR for 7 days with updates due 6/7     Employer:  Benefits:  Phone #: [phone]     Name: n/a Eff. Date: 08/04/21     Deduct: $0      Out of Pocket Max: [phone] (met $45)      Life Max: n/a CIR: $430/day for days 1-4      SNF: 20 full days Outpatient:      Co-Pay: $20/visit Home Health: 100%      Co-Pay:  DME: 80%     Co-Ins: 20% Providers:  SECONDARY:       Policy#:      Phone#:    Development worker, community:       Phone#:    The "Data Collection Information Summary" for patients in Inpatient Rehabilitation Facilities with attached "Privacy Act [place] Records" was provided and verbally reviewed with: Patient and Family   Emergency Contact Information Contact Information       Name Relation Home Work Ch[place] Daughter [phone]   [phone]           Current Medical History  Patient Admitting Diagnosis: CVA    History of Present Illness: Chad [name]. [place] is an 88 year old right-handed male with history of hypertension, CKD stage III, hyperlipidemia, mild aortic regurgitation, bilateral total knee arthroplasty, chronic pain using hydrocodone 7.5-325 mg 3 times daily as needed, and BPH maintained on Flomax.   Presented 12/29/2021 with acute onset of left-sided weakness resulting in a fall.  Denied loss of consciousness.  Cranial CT scan showed age-indeterminate lacunar infarction within the right basal ganglia.  Age-indeterminate small vessel ischemic changes throughout the periventricular white matter, favor chronic.  No acute hemorrhage.  CT  cervical spine negative for fracture.  Extensive multilevel cervical degenerative changes.  MRI/MRA showed cluster of small cortical and white matter infarcts within the right MCA distribution.  MRI focal high-grade narrowing at the right M2 level where there was calcification by CT.  CTA of the neck apparent occlusion of proximal right M2 MCA branch in the region of calcification possibly calcified embolus or calcified plaque.  Extensive calcific atherosclerosis of the right carotid bifurcation with approximately 80% stenosis of the proximal ICA.  Potentially severe stenosis of the nondominant right vertebral artery origin.  Patient did not receive tPA.  Echocardiogram with ejection fraction of 55 to 60% no wall motion abnormalities.  Admission chemistries unremarkable except glucose 119 BUN 35 creatinine 1.46, CK within normal limits, troponin 76, hemoglobin A1c 6.1, lactic acid within normal limits.  Hospital course interventional radiology consulted for extensive calcified atherosclerosis of the right ICA bifurcation with 80% stenosis status post right carotid angioplasty 5/31.  Placed on full dose aspirin and Brilinta therapy for CVA prophylaxis.  Plan is to follow-up outpatient interventional radiology 1 to 2 months if restenosis to consider right CEA.  Maintained on Lovenox for DVT prophylaxis.  Blood pressure monitored initially  maintained on Cleviprex.  He is currently maintained on a dysphagia #1 pudding thick liquid diet.  Therapy evaluations completed and pt was recommended for a comprehensive rehab program.   Complete NIHSS TOTAL: 17   Patient's medical record from [facility] has been reviewed by the rehabilitation admission coordinator and physician.   Past Medical History      Past Medical History:  Diagnosis Date   Arthritis     Heart murmur      asa child    Hypertension     Stroke [facility])        Has the patient had major surgery during 100 days prior to admission? Yes   Family  History   family history is not on file.   Current Medications   Current Facility-Administered Medications:    0.9 %  sodium chloride infusion, , Intravenous, Continuous, Rosalin Hawking, MD, Last Rate: 75 mL/hr at 12/31/21 2014, New Bag at 01/01/22 1307   0.9 %  sodium chloride infusion, , Intravenous, Continuous, Adhikari, Amrit, MD, Last Rate: 75 mL/hr at 12/31/21 2019, Restarted at 01/01/22 0911   0.9 %  sodium chloride infusion, , Intravenous, Continuous, Deveshwar, Sanjeev, MD, Last Rate: 75 mL/hr at 01/02/22 1200, Infusion Verify at 01/02/22 1200   acetaminophen (TYLENOL) tablet 650 mg, 650 mg, Oral, Q4H PRN **OR** acetaminophen (TYLENOL) 160 MG/5ML solution 650 mg, 650 mg, Per Tube, Q4H PRN **OR** acetaminophen (TYLENOL) suppository 650 mg, 650 mg, Rectal, Q4H PRN, Deveshwar, Sanjeev, MD   amLODipine (NORVASC) tablet 5 mg, 5 mg, Oral, Daily, Adhikari, Amrit, MD, 5 mg at 01/02/22 0935   aspirin chewable tablet 81 mg, 81 mg, Oral, Daily, 81 mg at 01/02/22 0936 **OR** aspirin chewable tablet 81 mg, 81 mg, Per Tube, Daily, Deveshwar, Sanjeev, MD   atenolol (TENORMIN) tablet 100 mg, 100 mg, Oral, q morning, Adhikari, Amrit, MD, 100 mg at 01/02/22 0936   Chlorhexidine Gluconate Cloth 2 % PADS 6 each, 6 each, Topical, Daily, Rosalin Hawking, MD, 6 each at 01/02/22 0938   clevidipine (CLEVIPREX) infusion 0.5 mg/mL, 0-21 mg/hr, Intravenous, Continuous, Deveshwar, Sanjeev, MD, Stopped at 01/02/22 0936   enoxaparin (LOVENOX) injection 30 mg, 30 mg, Subcutaneous, Q24H, Pierce, Dwayne A, RPH   fentaNYL (SUBLIMAZE) injection 25 mcg, 25 mcg, Intravenous, Q2H PRN, Doutova, Anastassia, MD   HYDROcodone-acetaminophen (NORCO) 7.5-325 MG per tablet 1 tablet, 1 tablet, Oral, TID PRN, Roel Cluck, Anastassia, MD, 1 tablet at 01/01/22 0322   labetalol (NORMODYNE) injection 10 mg, 10 mg, Intravenous, Q2H PRN, Adhikari, Amrit, MD, 10 mg at 12/31/21 2028   rosuvastatin (CRESTOR) tablet 20 mg, 20 mg, Oral, Daily, Rosalin Hawking,  MD, 20 mg at 01/02/22 0935   tamsulosin (FLOMAX) capsule 0.4 mg, 0.4 mg, Oral, Daily, Doutova, Anastassia, MD, 0.4 mg at 12/31/21 1026   ticagrelor (BRILINTA) tablet 90 mg, 90 mg, Oral, BID, 90 mg at 01/02/22 0935 **OR** ticagrelor (BRILINTA) tablet 90 mg, 90 mg, Per Tube, BID, Deveshwar, Sanjeev, MD   Patients Current Diet:  Diet Order                  DIET - DYS 1 Room service appropriate? Yes with Assist; Fluid consistency: Pudding Thick  Diet effective now                         Precautions / Restrictions Precautions Precautions: Fall Restrictions Weight Bearing Restrictions: No    Has the patient had 2 or more falls or a fall with injury  in the past year? Yes   Prior Activity Level Household: uses SPC/RW for mobilization and ADLs, mod I at baseline, not driving (dtr drives to appointments)   Prior Functional Level Self Care: Did the patient need help bathing, dressing, using the toilet or eating? Independent   Indoor Mobility: Did the patient need assistance with walking from room to room (with or without device)? Independent   Stairs: Did the patient need assistance with internal or external stairs (with or without device)? Independent   Functional Cognition: Did the patient need help planning regular tasks such as shopping or remembering to take medications? Independent   Patient Information Are you of Hispanic, Latino/a,or Spanish origin?: A. No, not of Hispanic, Latino/a, or Spanish origin What is your race?: B. Black or African American, C. American [place] or [place] Native Do you need or want an interpreter to communicate with a doctor or health care staff?: 0. No   Patient's Response To:  Health Literacy and Transportation Is the patient able to respond to health literacy and transportation needs?: No Health Literacy - How often do you need to have someone help you when you read instructions, pamphlets, or other written material from your doctor or pharmacy?:  Patient unable to respond In the past 12 months, has lack of transportation kept you from medical appointments or from getting medications?: No (per proxy) In the past 12 months, has lack of transportation kept you from meetings, work, or from getting things needed for daily living?: No (per proxy)   Home Assistive Devices / Equipment Home Equipment: Conservation officer, nature (2 wheels), [employer] - single point, Geneticist, molecular, BSC/3in1   Prior Device Use: Indicate devices/aids used by the patient prior to current illness, exacerbation or injury? Walker and SPC   Current Functional Level Cognition   Arousal/Alertness: Awake/alert Overall Cognitive Status: Impaired/Different from baseline Current Attention Level: Sustained Orientation Level: Oriented X4 Following Commands: Follows one step commands with increased time, Follows one step commands inconsistently Safety/Judgement: Decreased awareness of deficits, Decreased awareness of safety General Comments: Pt requiring increased time throughout session. Pt initially agreable to therapy. Stated he was irritated at his deficits toward end of session. Decreased initation and benefiting from verbal and tactile cues. Pt with signigicant left neglect and left field cut likely.  Difficult to get pt to look beyond midline to his left with right gaze preference. Attention: Sustained, Selective Sustained Attention: Impaired Sustained Attention Impairment: Verbal basic Selective Attention: Impaired Selective Attention Impairment: Verbal basic Memory: Impaired Memory Impairment: Retrieval deficit Awareness: Impaired Awareness Impairment: Emergent impairment Problem Solving: Impaired    Extremity Assessment (includes Sensation/Coordination)   Upper Extremity Assessment: Defer to OT evaluation LUE Deficits / Details: no initation of active movement. Tone noted with flexion at elbow and digits. Able to achieve extension of digits. Elbow extension to 120. Decreased  sensation LUE Sensation: decreased light touch, decreased proprioception LUE Coordination: decreased gross motor, decreased fine motor  Lower Extremity Assessment: LLE deficits/detail LLE Deficits / Details: Grossly 3-/5 at knee extensors and hip flexors. 0/5 at ankle     ADLs   Overall ADL's : Needs assistance/impaired Eating/Feeding: Moderate assistance, Bed level Grooming: Moderate assistance, Bed level Upper Body Bathing: Moderate assistance, Sitting Lower Body Bathing: Maximal assistance, Sit to/from stand Upper Body Dressing : Moderate assistance, Sitting Lower Body Dressing: Maximal assistance, Sit to/from stand Lower Body Dressing Details (indicate cue type and reason): donning socks at EOB Toilet Transfer: Moderate assistance Toilet Transfer Details (indicate cue type and reason): simulated  with side steps towards [facility] Functional mobility during ADLs: Moderate assistance (side steps towards HOB)     Mobility   Overal bed mobility: Needs Assistance Bed Mobility: Rolling, Sidelying to Sit Rolling: Mod assist Sidelying to sit: Mod assist, Max assist Supine to sit: Mod assist Sit to supine: Max assist General bed mobility comments: with multimodal cues, pt needed mod assist to facilitate roll to R and truncal assist up from R UE, pt cued to asisst L LE with R LE but difficulty doing so therefore needed assist with LEs out and into bed. Pt initiated movement to EOB with cues but could not assist with sliding left hip out and difficulty even with right hip.     Transfers   Overall transfer level: Needs assistance Equipment used: 2 person hand held assist Transfers: Sit to/from Stand Sit to Stand: Total assist, Max assist, +2 physical assistance General transfer comment: First stand attempt total assist of 2 with pt leaning quite a bit to right unless assisted.  Second attempt, Pt initiated stand therefore max assist of 2  but continues to have difficulty maintaining stand without  constant cues and assist with use of pad to maintain hip and trunk extension.  Attempts at weight shifting with use of pad to faciliate. When pt asked to complete weight shift, he moved minimially at his trunk and hips only.     Ambulation / Gait / Stairs / Wheelchair Mobility   Ambulation/Gait General Gait Details: not able yet     Posture / Balance Dynamic Sitting Balance Sitting balance - Comments: worked on moving in/out of BOS and re-finding midline and upright posture.  Pt leans to left and is unaware unless cued.  Pt cannot correct without assist.  Pt sits at times with min guard assist but most of the time needs min to mod assist due to loss of balance to his left.  Pts balance also worsens as he fatigues. Balance Overall balance assessment: Needs assistance Sitting-balance support: No upper extremity supported, Feet supported Sitting balance-Leahy Scale: Fair Sitting balance - Comments: worked on moving in/out of BOS and re-finding midline and upright posture.  Pt leans to left and is unaware unless cued.  Pt cannot correct without assist.  Pt sits at times with min guard assist but most of the time needs min to mod assist due to loss of balance to his left.  Pts balance also worsens as he fatigues. Standing balance support: Bilateral upper extremity supported, During functional activity Standing balance-Leahy Scale: Poor Standing balance comment: Reliant on UE and external support. L lateral lean in standing.  2 trials of 1 min working on w/shifting with intent of trying to get pt to stand upright.    Pt not able to adequately stand without +2 support wtih forward flexed trunk, head and neck.  Did not get much activation of left hemibody.     Special needs/care consideration N/A    Previous Home Environment (from acute therapy documentation) Living Arrangements: Alone  Lives With: Alone Available Help at Discharge: Family, Available 24 hours/day Type of Home: Apartment Home Layout:  One level Home Access: Level entry Bathroom Shower/Tub: Chiropodist: Standard   Discharge Living Setting Plans for Discharge Living Setting: Lives with (comment) (daughter, April) Type of Home at Discharge: House Discharge Home Layout: One level (sunken den, pt doesn't need to access) Discharge Home Access: Stairs to enter Entrance Stairs-Rails: Right Entrance Stairs-Number of Steps: 3 Discharge Bathroom Shower/Tub: Tub/shower unit Discharge Bathroom  Toilet: Handicapped height Discharge Bathroom Accessibility: Yes How Accessible: Accessible via walker Does the patient have any problems obtaining your medications?: No   Social/Family/Support Systems Anticipated Caregiver: daughter, April Albers Anticipated Caregiver's Contact Information: [phone] Ability/Limitations of Caregiver: n/a Caregiver Availability: 24/7 Discharge Plan Discussed with Primary Caregiver: Yes Is Caregiver In Agreement with Plan?: Yes Does Caregiver/Family have Issues with Lodging/Transportation while Pt is in Rehab?: No   Goals Patient/Family Goal for Rehab: PT/OT min assist w/c level, SLP min to mod assist Expected length of stay: 21-24 days Additional Information: reviewed updated goals for min assist w/c level with daughter day of admit and she confirms family can provide Pt/Family Agrees to Admission and willing to participate: Yes Program Orientation Provided & Reviewed with Pt/Caregiver Including Roles  & Responsibilities: Yes  Barriers to Discharge: Insurance for SNF coverage   Decrease burden of Care through IP rehab admission: n/a   Possible need for SNF placement upon discharge: not anticipated. Pt with good support from daughter at discharge and aware insurance will not pay for CIR and SNF.    Patient Condition: I have reviewed medical records from [facility], spoken with CSW, and patient and daughter. I met with patient at the bedside for inpatient rehabilitation  assessment.  Patient will benefit from ongoing PT, OT, and SLP, can actively participate in 3 hours of therapy a day 5 days of the week, and can make measurable gains during the admission.  Patient will also benefit from the coordinated team approach during an Inpatient Acute Rehabilitation admission.  The patient will receive intensive therapy as well as Rehabilitation physician, nursing, social worker, and care management interventions.  Due to bladder management, bowel management, safety, skin/wound care, disease management, medication administration, pain management, and patient education the patient requires 24 hour a day rehabilitation nursing.  The patient is currently mod assist with mobility and mod to max assist with basic ADLs.  Discharge setting and therapy post discharge at  home  is anticipated.  Patient has agreed to participate in the Acute Inpatient Rehabilitation Program and will admit today.   Preadmission Screen Completed By:  Michel Santee, PT, DPT 01/02/2022 12:15 PM ______________________________________________________________________   Discussed status with Dr. Dagoberto Ligas on 01/02/22  at 12:15 PM  and received approval for admission today.   Admission Coordinator:  Michel Santee, PT, DPT time 12:15 PM Sudie Grumbling 01/02/22     Assessment/Plan: Diagnosis: Does the need for close, 24 hr/day Medical supervision in concert with the patient's rehab needs make it unreasonable for this patient to be served in a less intensive setting? Yes Co-Morbidities requiring supervision/potential complications: R MCA strokes; L hemiparesis/chronic pain, HTN, CKD, BPH and insulin resistance which is new A1c 6.1 Due to bladder management, bowel management, safety, skin/wound care, disease management, medication administration, pain management, and patient education, does the patient require 24 hr/day rehab nursing? Yes Does the patient require coordinated care of a physician, rehab nurse, PT, OT, and SLP  to address physical and functional deficits in the context of the above medical diagnosis(es)? Yes Addressing deficits in the following areas: balance, endurance, locomotion, strength, transferring, bowel/bladder control, bathing, dressing, feeding, grooming, toileting, cognition, speech, language, and swallowing Can the patient actively participate in an intensive therapy program of at least 3 hrs of therapy 5 days a week? Yes The potential for patient to make measurable gains while on inpatient rehab is good and fair Anticipated functional outcomes upon discharge from inpatient rehab: min assist PT, min assist OT, min  assist and mod assist SLP Estimated rehab length of stay to reach the above functional goals is: 21-24 days Anticipated discharge destination: Home 10. Overall Rehab/Functional Prognosis: fair     MD Signature:

## 2022-01-03 DIAGNOSIS — K5900 Constipation, unspecified: Secondary | ICD-10-CM

## 2022-01-03 DIAGNOSIS — I10 Essential (primary) hypertension: Secondary | ICD-10-CM

## 2022-01-03 DIAGNOSIS — E871 Hypo-osmolality and hyponatremia: Secondary | ICD-10-CM

## 2022-01-03 DIAGNOSIS — N183 Chronic kidney disease, stage 3 unspecified: Secondary | ICD-10-CM

## 2022-01-03 LAB — CBC WITH DIFFERENTIAL/PLATELET
Abs Immature Granulocytes: 0.05 10*3/uL (ref 0.00–0.07)
Basophils Absolute: 0 10*3/uL (ref 0.0–0.1)
Basophils Relative: 0 %
Eosinophils Absolute: 0 10*3/uL (ref 0.0–0.5)
Eosinophils Relative: 0 %
HCT: 23.4 % — ABNORMAL LOW (ref 39.0–52.0)
Hemoglobin: 7.6 g/dL — ABNORMAL LOW (ref 13.0–17.0)
Immature Granulocytes: 1 %
Lymphocytes Relative: 21 %
Lymphs Abs: 1.2 10*3/uL (ref 0.7–4.0)
MCH: 27.5 pg (ref 26.0–34.0)
MCHC: 32.5 g/dL (ref 30.0–36.0)
MCV: 84.8 fL (ref 80.0–100.0)
Monocytes Absolute: 1.2 10*3/uL — ABNORMAL HIGH (ref 0.1–1.0)
Monocytes Relative: 20 %
Neutro Abs: 3.4 10*3/uL (ref 1.7–7.7)
Neutrophils Relative %: 58 %
Platelets: 170 10*3/uL (ref 150–400)
RBC: 2.76 MIL/uL — ABNORMAL LOW (ref 4.22–5.81)
RDW: 16.8 % — ABNORMAL HIGH (ref 11.5–15.5)
WBC: 5.9 10*3/uL (ref 4.0–10.5)
nRBC: 0.3 % — ABNORMAL HIGH (ref 0.0–0.2)

## 2022-01-03 LAB — COMPREHENSIVE METABOLIC PANEL
ALT: 10 U/L (ref 0–44)
AST: 20 U/L (ref 15–41)
Albumin: 2.4 g/dL — ABNORMAL LOW (ref 3.5–5.0)
Alkaline Phosphatase: 46 U/L (ref 38–126)
Anion gap: 6 (ref 5–15)
BUN: 29 mg/dL — ABNORMAL HIGH (ref 8–23)
CO2: 20 mmol/L — ABNORMAL LOW (ref 22–32)
Calcium: 8.4 mg/dL — ABNORMAL LOW (ref 8.9–10.3)
Chloride: 122 mmol/L — ABNORMAL HIGH (ref 98–111)
Creatinine, Ser: 1.48 mg/dL — ABNORMAL HIGH (ref 0.61–1.24)
GFR, Estimated: 45 mL/min — ABNORMAL LOW (ref >60)
Glucose, Bld: 110 mg/dL — ABNORMAL HIGH (ref 70–99)
Potassium: 3.5 mmol/L (ref 3.5–5.1)
Sodium: 148 mmol/L — ABNORMAL HIGH (ref 135–145)
Total Bilirubin: 0.3 mg/dL (ref 0.3–1.2)
Total Protein: 6.1 g/dL — ABNORMAL LOW (ref 6.5–8.1)

## 2022-01-03 NOTE — Evaluation (Signed)
Speech Language Pathology Assessment and Plan  Patient Details  Name: Chad [name] MRN: [id number] Date of Birth: 1932/03/28  SLP Diagnosis: Dysarthria;Cognitive Impairments;Dysphagia  Rehab Potential: Good ELOS: 4-4.5 weeks   Today's Date: 01/03/2022 SLP Individual Time: 0901-1000 SLP Individual Time Calculation (min): 41 min  Hospital Problem: Principal Problem:   Right middle cerebral artery stroke [facility])  Past Medical History:  Past Medical History:  Diagnosis Date   Arthritis    Heart murmur    asa child    Hypertension    Stroke [facility])    Past Surgical History:  Past Surgical History:  Procedure Laterality Date   EYE SURGERY Bilateral 2022   IR ANGIO INTRA EXTRACRAN SEL COM CAROTID INNOMINATE UNI R MOD SED  01/01/2022   IR CT HEAD LTD  01/01/2022   IR PTA INTRACRANIAL  01/01/2022   JOINT REPLACEMENT     RADIOLOGY WITH ANESTHESIA N/A 01/01/2022   Procedure: Angiogram;  Surgeon: Luanne Bras, MD;  Location: [place];  Service: Radiology;  Laterality: N/A;   TONSILLECTOMY     TOTAL KNEE ARTHROPLASTY Right 03/30/2013   Procedure: RIGHT TOTAL KNEE ARTHROPLASTY;  Surgeon: Tobi Bastos, MD;  Location: [facility];  Service: Orthopedics;  Laterality: Right;   TOTAL KNEE ARTHROPLASTY Left 10/09/2020   Procedure: TOTAL KNEE ARTHROPLASTY;  Surgeon: Paralee Cancel, MD;  Location: [facility];  Service: Orthopedics;  Laterality: Left;  70 mins    Assessment / Plan / Recommendation Clinical Impression  Patient is a 88 y.o. year old male with history of hypertension, CKD stage III, hyperlipidemia, mild aortic regurgitation, bilateral total knee arthroplasty, chronic pain, BPH maintained on Flomax.  Per chart review patient lives alone independent with ADLs using a straight cane for mobility.  1 level apartment.  He does have family in the area.  Presented 12/29/2021 with acute onset of left-sided weakness resulting in a fall.  Cranial CT scan showed age-indeterminate lacunar infarction  within the right basal ganglia.  Age-indeterminate small vessel ischemic changes throughout the periventricular white matter, favor chronic.  No acute hemorrhage.  CT cervical spine negative for fracture.  Extensive multilevel cervical degenerative changes.  MRI/MRA showed cluster of small cortical and white matter infarcts within the right MCA distribution. He is currently maintained on a dysphagia #1 pudding thick liquid diet advanced to nectar liquids 01/02/2022.  Therapy evaluations completed due to patient left-sided weakness and dysphagia was admitted for a comprehensive rehab program. Patient transferred to CIR on 01/02/2022.  Pt presents with moderate oropharyngeal swallow impairment at this time as well as moderate L facial droop and lingual deviation. Pt noted with AM meal untouched, daughter reports she is nervous to feed him at this time due to change in swallow function. Pt is a dependent feeder at this time. Pt tolerating puree solids with decreased oral motor control and multiple swallows per bolus, no overt s/s aspiration. Pt administered trial of Dys 2 texture which resulted in severely prolonged mastication, piecemeal deglutition and multiple swallows. Pt does have dentures which daughter encouraged to bring. Pt tolerating single sips NTL via cup and straw with no significant difficulty or anterior loss. With successive sips NTL, pt with large coughing episode and wet vocal quality after. Pt trialed with single ice chips and thin water via spoon with no overt s/s aspiration. Large boluses appear to cause patient most difficulty. Pt requiring extensive suctioning after trials due to poor secretion management. Recommend continuing Dys 1/NTL with full supervision, strict swallow precautions and aggressive oral  care.  With informal assessment at this time due to time constraints, pt presents with moderate to moderately-severe cognitive changes and impairments primarily in attention, problem solving,  recall, L scan, self-monitoring and awareness. Pt perseverating on topic of "gumbo" and "jambalaya" with difficulty redirection, is able to follow simple directions and answer simple questions. Pt with significant R gaze with inability to scan L of midline. Further assessment in upcoming tx sessions. Pt also demonstrates significant dysarthric speech which, when coupled with decreased management of secretions, makes intelligibility <50% at phrase level. Pt was independent at home and family and patient would like for him to return to management of functional iADLs if possible. Pt will likely required 24/7 supervision at discharge, has supportive family. Pt will benefit from skilled ST in CIR to increase safety and independence with daily routine, increase speech intelligibility and improve oropharyngeal swallow function.    Skilled Therapeutic Interventions          Pt participating in Bedside Swallow Evaluation and speech, language and cognitive assessment. Please see above.    SLP Assessment  Patient will need skilled Speech Lanaguage Pathology Services during CIR admission    Recommendations  SLP Diet Recommendations: Dysphagia 1 (Puree);Nectar Liquid Administration via: Cup;Straw Medication Administration: Crushed with puree Supervision: Full supervision/cueing for compensatory strategies Compensations: Slow rate;Small sips/bites;Monitor for anterior loss;Follow solids with liquid Postural Changes and/or Swallow Maneuvers: Seated upright 90 degrees Oral Care Recommendations: Oral care QID Patient destination: Home Follow up Recommendations: 24 hour supervision/assistance;Home Health SLP;Outpatient SLP Equipment Recommended: To be determined    SLP Frequency 3 to 5 out of 7 days   SLP Duration  SLP Intensity  SLP Treatment/Interventions 3-3.5 weeks  Minumum of 1-2 x/day, 30 to 90 minutes  Cognitive remediation/compensation;Environmental controls;Internal/external aids;Therapeutic  Exercise;Therapeutic Activities;Patient/family education;Functional tasks;Dysphagia/aspiration precaution training;Cueing hierarchy    Pain Pain Assessment Pain Scale: 0-10 Pain Score: 0-No pain  Prior Functioning Cognitive/Linguistic Baseline: Within functional limits Type of Home: Apartment (Simultaneous filing. User may not have seen previous data.)  Lives With: Alone (Simultaneous filing. User may not have seen previous data.) Available Help at Discharge: Family;Available 24 hours/day (Simultaneous filing. User may not have seen previous data.) Vocation: Retired  Programmer, systems Overall Cognitive Status: Impaired/Different from baseline Arousal/Alertness: Awake/alert Orientation Level: Oriented to person;Oriented to place Attention: Sustained;Selective Sustained Attention: Impaired Sustained Attention Impairment: Verbal basic Selective Attention: Impaired Selective Attention Impairment: Verbal basic Memory: Impaired Memory Impairment: Retrieval deficit;Decreased recall of new information Awareness: Impaired Awareness Impairment: Intellectual impairment Problem Solving: Impaired Problem Solving Impairment: Verbal basic;Functional basic Behaviors: Perseveration Safety/Judgment: Other (comment)  Comprehension Auditory Comprehension Overall Auditory Comprehension: Appears within functional limits for tasks assessed Expression Expression Primary Mode of Expression: Verbal Verbal Expression Overall Verbal Expression: Appears within functional limits for tasks assessed Written Expression Dominant Hand: Right Oral Motor Oral Motor/Sensory Function Overall Oral Motor/Sensory Function: Moderate impairment Facial ROM: Reduced left;Suspected CN VII (facial) dysfunction Facial Symmetry: Abnormal symmetry left;Suspected CN VII (facial) dysfunction Facial Strength: Reduced left;Suspected CN VII (facial) dysfunction Facial Sensation: Reduced left;Suspected CN V  (Trigeminal) dysfunction Lingual ROM: Reduced left;Suspected CN XII (hypoglossal) dysfunction Lingual Symmetry: Abnormal symmetry left;Suspected CN XII (hypoglossal) dysfunction Lingual Strength: Reduced Motor Speech Overall Motor Speech: Impaired Respiration: Within functional limits Phonation: Wet Resonance: Within functional limits Articulation: Impaired Intelligibility: Intelligibility reduced Word: 50-74% accurate Phrase: 25-49% accurate Sentence: 25-49% accurate Motor Speech Errors: Aware Interfering Components: Inadequate dentition Effective Techniques: Over-articulate;Increased vocal intensity  Care Tool Care Tool Cognition Ability to hear (with hearing  aid or hearing appliances if normally used Ability to hear (with hearing aid or hearing appliances if normally used): 2. Moderate difficulty - speaker has to increase volume and speak distinctly   Expression of Ideas and Wants Expression of Ideas and Wants: 2. Frequent difficulty - frequently exhibits difficulty with expressing needs and ideas   Understanding Verbal and Non-Verbal Content Understanding Verbal and Non-Verbal Content: 2. Sometimes understands - understands only basic conversations or simple, direct phrases. Frequently requires cues to understand  Memory/Recall Ability Memory/Recall Ability : Current season   Intelligibility: Intelligibility reduced Word: 50-74% accurate Phrase: 25-49% accurate Sentence: 25-49% accurate  Bedside Swallowing Assessment General Date of Onset: 12/29/21 Previous Swallow Assessment: BSE in acute Diet Prior to this Study: Dysphagia 1 (puree);Nectar-thick liquids Temperature Spikes Noted: No Respiratory Status: Room air History of Recent Intubation: No Behavior/Cognition: Alert;Cooperative;Pleasant mood;Requires cueing Oral Cavity - Dentition: Edentulous (has dentures, not present) Self-Feeding Abilities: Needs assist Patient Positioning: Upright in bed Baseline Vocal Quality:  Low vocal intensity;Wet Volitional Cough: Strong Volitional Swallow: Able to elicit  Oral Care Assessment   Ice Chips Ice chips: Within functional limits Thin Liquid Thin Liquid: Impaired Presentation: Spoon Oral Phase Impairments: Poor awareness of bolus Nectar Thick Nectar Thick Liquid: Impaired Presentation: Cup;Straw Oral Phase Impairments: Poor awareness of bolus Pharyngeal Phase Impairments: Cough - Immediate (via consecutive straw sip) Honey Thick Honey Thick Liquid: Not tested Puree Puree: Impaired Presentation: Spoon Oral Phase Impairments: Reduced lingual movement/coordination Pharyngeal Phase Impairments: Multiple swallows;Throat Clearing - Delayed Solid Solid: Impaired Presentation: Spoon Oral Phase Impairments: Impaired mastication Oral Phase Functional Implications: Impaired mastication;Prolonged oral transit BSE Assessment Risk for Aspiration Impact on safety and function: Mild aspiration risk Other Related Risk Factors: Decreased management of secretions;Cognitive impairment  Short Term Goals: Week 1: SLP Short Term Goal 1 (Week 1): Pt will tolerate Dys 1/NTL diet with minimal s/s aspiration provided mod A cues for swallow precautions SLP Short Term Goal 2 (Week 1): Pt will tolerate trials of Dys 2 and thin liquids with SLP only with minimal s/s aspiration to determine appropriateness for follow up MBS or diet upgrade SLP Short Term Goal 3 (Week 1): Pt will participate in further cognitive testing to determine current impairments and guide POC SLP Short Term Goal 4 (Week 1): Pt will increase sustained attention during functional tasks to 5-7 minutes provided mod A cues SLP Short Term Goal 5 (Week 1): Pt will solve basic problems during functional tasks provided mod A cues SLP Short Term Goal 6 (Week 1): Pt will utilize speech intelligibility strategies as trained provided max A cues to achieve 70% intelligibility at phrase level  Refer to Care Plan for Long  Term Goals  Recommendations for other services: None   Discharge Criteria: Patient will be discharged from SLP if patient refuses treatment 3 consecutive times without medical reason, if treatment goals not met, if there is a change in medical status, if patient makes no progress towards goals or if patient is discharged from hospital.  The above assessment, treatment plan, treatment alternatives and goals were discussed and mutually agreed upon: by patient and by family  Dewaine Conger 01/03/2022, 12:52 PM

## 2022-01-03 NOTE — Progress Notes (Signed)
Inpatient Rehabilitation Care Coordinator Assessment and Plan Patient Details  Name: Chad [name] MRN: [id number] Date of Birth: Mar 03, 1932  Today's Date: 01/03/2022  Hospital Problems: Principal Problem:   Right middle cerebral artery stroke [facility])  Past Medical History:  Past Medical History:  Diagnosis Date   Arthritis    Heart murmur    asa child    Hypertension    Stroke [facility])    Past Surgical History:  Past Surgical History:  Procedure Laterality Date   EYE SURGERY Bilateral 2022   IR ANGIO INTRA EXTRACRAN SEL COM CAROTID INNOMINATE UNI R MOD SED  01/01/2022   IR CT HEAD LTD  01/01/2022   IR PTA INTRACRANIAL  01/01/2022   JOINT REPLACEMENT     RADIOLOGY WITH ANESTHESIA N/A 01/01/2022   Procedure: Angiogram;  Surgeon: Luanne Bras, MD;  Location: [place];  Service: Radiology;  Laterality: N/A;   TONSILLECTOMY     TOTAL KNEE ARTHROPLASTY Right 03/30/2013   Procedure: RIGHT TOTAL KNEE ARTHROPLASTY;  Surgeon: Tobi Bastos, MD;  [facility];  Service: Orthopedics;  Laterality: Right;   TOTAL KNEE ARTHROPLASTY Left 10/09/2020   Procedure: TOTAL KNEE ARTHROPLASTY;  Surgeon: Paralee Cancel, MD;  Location[facility];  Service: Orthopedics;  Laterality: Left;  70 mins   Social History:  reports that he has never smoked. He has never used smokeless tobacco. He reports that he does not drink alcohol and does not use drugs.  Family / Support Systems Marital Status: Widow/Widower Patient Roles: Parent Children: April-daughter [phone]  Monica-daughter currently here from [place] unsure who long staying -working remotely Other Supports: Four other childre-total of 6 Anticipated Caregiver: April local daughter others are out of state Ability/Limitations of Caregiver: April is trying to work remotely and figuring out plan for Dad.  Aware Dad will need 24/7 care at [place] Caregiver Availability: Other (Comment) (working on a 24/7 plan) Family Dynamics: Close with all of his  chidlren he has four girls and two boys. Most are in [place] since they were born and raised there. Pt tells worker he is the boss of the family  Social History Preferred language: English Religion: Baptist Cultural Background: No issues Education: [place] - How often do you need to have someone help you when you read instructions, pamphlets, or other written material from your doctor or pharmacy?: Rarely Writes: Yes Employment Status: Retired Public relations account executive Issues: No issues Guardian/Conservator: None-according to MD pt is capable of making his own decisions while here. Will consult with daughter due to pt has some processing problems from his CVA   Abuse/Neglect Abuse/Neglect Assessment Can Be Completed: Yes Physical Abuse: Denies Verbal Abuse: Denies Sexual Abuse: Denies Exploitation of patient/patient's resources: Denies Self-Neglect: Denies  Patient response to: Social Isolation - How often do you feel lonely or isolated from those around you?: Rarely  Emotional Status [name] affect, behavior and adjustment status: Pt has always been very independent and wants to get back to this level. He lived alone and took care of himself, he is not used to relying upon others. His daughter drives him to where he needs to go Recent Psychosocial Issues: other health issues Psychiatric History: No history or issues. Pt is aware of his CVA and taking each day at a time. Substance Abuse History: No issues  Patient / Family Perceptions, Expectations & Goals Pt/Family understanding of illness & functional limitations: Pt and daughter can explain his stroke and his deficits. Both have spoken with the MD and feel  they understand his treatment plan moving forward.  Pt expects to recover from this before he leaves here Premorbid pt/family roles/activities: Father, grandfather, retiree, neighbor, etc Anticipated changes in roles/activities/participation: resume Pt/family  expectations/goals: Pt states: " I want to go back home and do for myself."  Daughter states: " I know he will work hard because he is stuborn and very independent."  Recruitment consultant: None Premorbid Home Care/DME Agencies: Other (Comment) (bsc, cane, riser, rw) Transportation available at discharge: Daughter-April drives him to his appointments Is the patient able to respond to transportation needs?: Yes In the past 12 months, has lack of transportation kept you from medical appointments or from getting medications?: No In the past 12 months, has lack of transportation kept you from meetings, work, or from getting things needed for daily living?: No  Discharge Planning Living Arrangements: Alone Support Systems: Children, Other relatives, Friends/neighbors Type of Residence: Private residence Insurance Resources: Multimedia programmer (specify) ([employer] Medicare) Financial Resources: Radio broadcast assistant Screen Referred: No Living Expenses: Rent Money Management: Patient Does the patient have any problems obtaining your medications?: No Home Management: Self family to assist now Patient/Family Preliminary Plans: Either return back to his apartment or will go to [employer] home. Daughter needing to see how well he does and then work on a plan for him. She is trying to work remotely and then can be there for him. Family to work on a plan for him. Made aware team feels he will need 24/7 care at discharge. Care Coordinator Barriers to Discharge: Insurance for SNF coverage Care Coordinator Anticipated Follow Up Needs: HH/OP  Clinical Impression Pleasant independent gentleman who is motivated to return to his independent level and back to his apartment. His children are involved but only one daughter-April is local the others are out of state. Aware pt will need 24/7 care and will need to work on a plan for his. Monica-daughter is here from [place] and unsure who long she will be  here. Await team evaluations and work on discharge needs.  Elease Hashimoto 01/03/2022, 12:38 PM

## 2022-01-03 NOTE — Evaluation (Signed)
Occupational Therapy Assessment and Plan  Patient Details  Name: Chad [name] MRN: [id number] Date of Birth: 05/16/1932  OT Diagnosis: abnormal posture, cognitive deficits, disturbance of vision, hemiplegia affecting non-dominant side, and muscle weakness (generalized) Rehab Potential: Rehab Potential (ACUTE ONLY): Fair ELOS: 4-4.5 weeks   Today's Date: 01/03/2022 OT Individual Time: 1050-1207 OT Individual Time Calculation (min): 77 min     Hospital Problem: Principal Problem:   Right middle cerebral artery stroke [facility])   Past Medical History:  Past Medical History:  Diagnosis Date   Arthritis    Heart murmur    asa child    Hypertension    Stroke [facility])    Past Surgical History:  Past Surgical History:  Procedure Laterality Date   EYE SURGERY Bilateral 2022   IR ANGIO INTRA EXTRACRAN SEL COM CAROTID INNOMINATE UNI R MOD SED  01/01/2022   IR CT HEAD LTD  01/01/2022   IR PTA INTRACRANIAL  01/01/2022   JOINT REPLACEMENT     RADIOLOGY WITH ANESTHESIA N/A 01/01/2022   Procedure: Angiogram;  Surgeon: Luanne Bras, MD;  Location: [place];  Service: Radiology;  Laterality: N/A;   TONSILLECTOMY     TOTAL KNEE ARTHROPLASTY Right 03/30/2013   Procedure: RIGHT TOTAL KNEE ARTHROPLASTY;  Surgeon: Tobi Bastos, MD;  Location: [facility];  Service: Orthopedics;  Laterality: Right;   TOTAL KNEE ARTHROPLASTY Left 10/09/2020   Procedure: TOTAL KNEE ARTHROPLASTY;  Surgeon: Paralee Cancel, MD;  Location: [facility];  Service: Orthopedics;  Laterality: Left;  70 mins    Assessment & Plan Clinical Impression: Patient is a 88 y.o. year old male with history of hypertension, CKD stage III, hyperlipidemia, mild aortic regurgitation, bilateral total knee arthroplasty, chronic pain using hydrocodone 7.5-325 mg 3 times daily as needed and BPH maintained on Flomax.  Per chart review patient lives alone independent with ADLs using a straight cane for mobility.  1 level apartment.  He does have family in  the area.  Presented 12/29/2021 with acute onset of left-sided weakness resulting in a fall.  Denied loss of consciousness.  Cranial CT scan showed age-indeterminate lacunar infarction within the right basal ganglia.  Age-indeterminate small vessel ischemic changes throughout the periventricular white matter, favor chronic.  No acute hemorrhage.  CT cervical spine negative for fracture.  Extensive multilevel cervical degenerative changes.  MRI/MRA showed cluster of small cortical and white matter infarcts within the right MCA distribution.  MRI focal high-grade narrowing at the right M2 level where there was calcification by CT.  CTA of the neck apparent occlusion of proximal right M2 MCA branch in the region of calcification possibly calcified embolus or calcified plaque.  Extensive calcific atherosclerosis of the right carotid bifurcation with approximately 80% stenosis of the proximal ICA.  Potentially severe stenosis of the nondominant right vertebral artery origin.  Patient did not receive tPA.  Echocardiogram with ejection fraction of 55 to 60% no wall motion abnormalities.  Admission chemistries unremarkable except glucose 119 BUN 35 creatinine 1.46, CK within normal limits, troponin 76, hemoglobin A1c 6.1, lactic acid within normal limits.  Hospital course interventional radiology consulted for extensive calcified atherosclerosis of the right ICA bifurcation with 80% stenosis status post right carotid angioplasty 5/31.  Placed on full dose aspirin and Brilinta therapy for CVA prophylaxis.  Plan is to follow-up outpatient interventional radiology 1 to 2 months if restenosis to consider right CEA.  Maintained on Lovenox for DVT prophylaxis.  Blood pressure monitored initially maintained on Cleviprex.  He is currently  maintained on a dysphagia #1 pudding thick liquid diet advanced to nectar liquids 01/02/2022.  Therapy evaluations completed due to patient left-sided weakness and dysphagia was admitted for a  comprehensive rehab program.  .  Patient transferred to CIR on 01/02/2022 .    Patient currently requires total with basic self-care skills secondary to muscle weakness, impaired timing and sequencing, abnormal tone, unbalanced muscle activation, motor apraxia, decreased coordination, and decreased motor planning, decreased visual motor skills and field cut, decreased midline orientation, decreased attention to left, left side neglect, and decreased motor planning, decreased initiation, decreased attention, decreased awareness, decreased problem solving, decreased safety awareness, decreased memory, and delayed processing, and decreased sitting balance, decreased standing balance, decreased postural control, hemiplegia, and decreased balance strategies.  Prior to hospitalization, patient could complete BADL/mobility with modified independent .  Patient will benefit from skilled intervention to decrease level of assist with basic self-care skills and increase independence with basic self-care skills prior to discharge home with care partner.  Anticipate patient will require 24 hour supervision and moderate physical assestance and follow up home health.  OT - End of Session Activity Tolerance: Tolerates 10 - 20 min activity with multiple rests Endurance Deficit: Yes OT Assessment Rehab Potential (ACUTE ONLY): Fair OT Barriers to Discharge: Decreased caregiver support;Home environment access/layout;Incontinence OT Patient demonstrates impairments in the following area(s): Balance;Behavior;Cognition;Endurance;Motor;Safety;Perception;Sensory;Vision OT Basic ADL's Functional Problem(s): Eating;Grooming;Bathing;Dressing;Toileting OT Transfers Functional Problem(s): Toilet;Tub/Shower OT Additional Impairment(s): Fuctional Use of Upper Extremity OT Plan OT Intensity: Minimum of 1-2 x/day, 45 to 90 minutes OT Frequency: 5 out of 7 days OT Duration/Estimated Length of Stay: 4-4.5 weeks OT  Treatment/Interventions: Balance/vestibular training;Disease mangement/prevention;Neuromuscular re-education;Self Care/advanced ADL retraining;Therapeutic Exercise;Wheelchair propulsion/positioning;Pain management;DME/adaptive equipment instruction;Cognitive remediation/compensation;Community reintegration;Functional electrical stimulation;Patient/family education;Splinting/orthotics;UE/LE Coordination activities;UE/LE Strength taining/ROM;Visual/perceptual remediation/compensation;Functional mobility training;Discharge planning;Psychosocial support;Therapeutic Activities OT Self Feeding Anticipated Outcome(s): S OT Basic Self-Care Anticipated Outcome(s): mod A OT Toileting Anticipated Outcome(s): mod A OT Bathroom Transfers Anticipated Outcome(s): mod A OT Recommendation Patient destination: Home Follow Up Recommendations: Home health OT;Outpatient OT;24 hour supervision/assistance Equipment Recommended: To be determined   OT Evaluation Precautions/Restrictions  Precautions Precautions: Fall Precaution Comments: dense L hemi/neglect Restrictions Weight Bearing Restrictions: No General Chart Reviewed: Yes Response to Previous Treatment: Patient with no complaints from previous session Family/Caregiver Present: Yes (daughter Brayton Layman) Vital Signs  Pain Pain Assessment Pain Scale: 0-10 Pain Score: 0-No pain Home Living/Prior Functioning Home Living Living Arrangements: Alone Available Help at Discharge: Family, Available 24 hours/day (Simultaneous filing. User may not have seen previous data.) Type of Home: Apartment (Simultaneous filing. User may not have seen previous data.) Home Access: Level entry Home Layout: One level Bathroom Shower/Tub: Chiropodist: Standard Additional Comments: Information above on daughter's house  Lives With: Alone (Simultaneous filing. User may not have seen previous data.) IADL History Homemaking Responsibilities: Yes Meal Prep  Responsibility: Primary Laundry Responsibility: Primary Cleaning Responsibility: Primary Bill Paying/Finance Responsibility: Primary Shopping Responsibility: Secondary Occupation: Retired Prior Function Level of Independence: Requires assistive device for independence ([place] indoors, RW outdoors) Driving: No Vocation: Retired Surveyor, mining Baseline Vision/History: 1 Wears glasses Ability to See in Adequate Light: 1 Impaired Patient Visual Report: No change from baseline Vision Assessment?: Yes Ocular Range of Motion: Restricted on the left Alignment/Gaze Preference: Head turned;Gaze right Tracking/Visual Pursuits: Decreased smoothness of horizontal tracking;Decreased smoothness of vertical tracking;Requires cues, head turns, or add eye shifts to track;Unable to hold eye position out of midline Saccades: Decreased speed of saccadic movement;Additional head turns occurred during testing Convergence: Impaired - to be  further tested in functional context Visual Fields: Left visual field deficit Additional Comments: poor visual attention and ability to follow commands Perception  Perception: Impaired Inattention/Neglect: Does not attend to left visual field;Does not attend to left side of body Praxis Praxis: Impaired Praxis Impairment Details: Motor planning;Perseveration;Initiation Cognition Cognition Overall Cognitive Status: Impaired/Different from baseline Arousal/Alertness: Awake/alert Orientation Level: Person Memory: Impaired Memory Impairment: Retrieval deficit;Decreased recall of new information Attention: Sustained;Selective Sustained Attention: Impaired Sustained Attention Impairment: Verbal basic Selective Attention: Impaired Selective Attention Impairment: Verbal basic Awareness: Impaired Awareness Impairment: Intellectual impairment Problem Solving: Impaired Problem Solving Impairment: Verbal basic;Functional basic Behaviors: Perseveration Safety/Judgment: Other  (comment) Brief Interview for Mental Status (BIMS) Repetition of Three Words (First Attempt): 3 Temporal Orientation: Year: Correct Temporal Orientation: Month: Accurate within 5 days Temporal Orientation: Day: Correct Recall: "Sock": No, could not recall Recall: "Blue": No, could not recall Recall: "Bed": No, could not recall BIMS Summary Score: 9 Sensation Sensation Light Touch: Impaired by gross assessment Hot/Cold: Not tested Proprioception: Impaired by gross assessment Stereognosis: Impaired by gross assessment Coordination Gross Motor Movements are Fluid and Coordinated: No Fine Motor Movements are Fluid and Coordinated: No Coordination and Movement Description: dense L hemi with sensorimotor deficits Finger Nose Finger Test: unable to follow commands on R, unable to complete on L Motor  Motor Motor: Hemiplegia;Abnormal tone Motor - Skilled Clinical Observations: dense L hemi with sensorimotor deficits  Trunk/Postural Assessment  Cervical Assessment Cervical Assessment: Exceptions to [facility] (head turned R) Thoracic Assessment Thoracic Assessment: Exceptions to [facility] (rounded shoulders) Lumbar Assessment Lumbar Assessment: Within Functional Limits Postural Control Postural Control: Deficits on evaluation (L Lean/LOB)  Balance Balance Balance Assessed: Yes Static Sitting Balance Static Sitting - Balance Support: Feet supported;No upper extremity supported Static Sitting - Level of Assistance: 4: Min assist;3: Mod assist Static Standing Balance Static Standing - Balance Support: During functional activity;Right upper extremity supported Static Standing - Level of Assistance: 2: Max assist Extremity/Trunk Assessment RUE Assessment RUE Assessment: Exceptions to [facility] General Strength Comments: generalized weakness and poor initation/perseveration impairs functional use LUE Assessment LUE Assessment: Exceptions to [facility] LUE Body System: Neuro Brunstrum levels for arm and  hand: Arm;Hand Brunstrum level for arm: Stage I Presynergy Brunstrum level for hand: Stage I Flaccidity  Care Tool Care Tool Self Care Eating   Eating Assist Level: Maximal Assistance - Patient 25 - 49%    Oral Care         Bathing   Body parts bathed by patient: Face;Left arm;Right upper leg Body parts bathed by helper: Right arm;Chest;Abdomen;Left upper leg;Right lower leg;Left lower leg   Assist Level: Total Assistance - Patient < 25%    Upper Body Dressing(including orthotics)   What is the patient wearing?: Pull over shirt   Assist Level: Total Assistance - Patient < 25%    Lower Body Dressing (excluding footwear)   What is the patient wearing?: Pants Assist for lower body dressing: 2 Helpers    Putting on/Taking off footwear   What is the patient wearing?: Non-skid slipper socks Assist for footwear: Total Assistance - Patient < 25%       Care Tool Toileting Toileting activity   Assist for toileting: Total Assistance - Patient < 25%     Care Tool Bed Mobility Roll left and right activity        Sit to lying activity        Lying to sitting on side of bed activity   Lying to sitting on side of bed assist  level: the ability to move from lying on the back to sitting on the side of the bed with no back support.: Total Assistance - Patient < 25%     Care Tool Transfers Sit to stand transfer   Sit to stand assist level: Maximal Assistance - Patient 25 - 49%    Chair/bed transfer   Chair/bed transfer assist level: Dependent - mechanical lift (stedy)     Toilet transfer Toilet transfer activity did not occur: Safety/medical concerns       Care Tool Cognition  Expression of Ideas and Wants Expression of Ideas and Wants: 2. Frequent difficulty - frequently exhibits difficulty with expressing needs and ideas  Understanding Verbal and Non-Verbal Content Understanding Verbal and Non-Verbal Content: 2. Sometimes understands - understands only basic conversations  or simple, direct phrases. Frequently requires cues to understand   Memory/Recall Ability Memory/Recall Ability : Current season   Refer to Care Plan for Long Term Goals  SHORT TERM GOAL WEEK 1 OT Short Term Goal 1 (Week 1): Pt will maintain static sitting balance EOB with CGA > 5 min. OT Short Term Goal 2 (Week 1): Pt will don shirt with mod A. OT Short Term Goal 3 (Week 1): Pt will complete >2 grooming tasks seated at sink with S. OT Short Term Goal 4 (Week 1): Pt will complete toilet transfer with maxA and LRAD.  Recommendations for other services: None    Skilled Therapeutic Intervention ADL ADL Eating: Maximal assistance Where Assessed-Eating: Wheelchair Grooming: Maximal assistance Where Assessed-Grooming: Sitting at sink Upper Body Bathing: Maximal assistance Where Assessed-Upper Body Bathing: Sitting at sink Lower Body Bathing: Maximal assistance Where Assessed-Lower Body Bathing: Sitting at sink Upper Body Dressing: Maximal assistance Where Assessed-Upper Body Dressing: Edge of bed Lower Body Dressing: Maximal assistance Where Assessed-Lower Body Dressing: Standing at sink Toileting: Maximal assistance Where Assessed-Toileting: Bed level Toilet Transfer: Unable to assess Tub/Shower Transfer: Not assessed Social research officer, government: Not assessed Mobility  Bed Mobility Bed Mobility: Supine to Sit Supine to Sit: Total Assistance - Patient < 25% Transfers Sit to Stand: Maximal Assistance - Patient 25-49% Stand to Sit: Maximal Assistance - Patient 25-49%  Session Eval: Pt received supine in bed , RN and NT present performing pericare/brief change post incontinent void of bowel. Daughter present throughout sesison. No c/o pain and pt agreeable to OT eval. Reviewed role of CIR OT, evaluation process, ADL/func mobility retraining, goals for therapy, and safety plan. Evaluation completed as documented above. Pt came to sitting EOB on his L with total A. CGA up to mod A for  balance seated EOB due to L LOB. Pt with noted L inattention/R gaze preference. Often tangential in conversation or poor initiation. Stedy transfer > TIS with max A of 1 from elevated surface and to maintain hemi body in alignment. Pt able to wash face and L arm, brush hair when cued, but requires max to total A for UB/LB bathing dressing due to poor sustained attention to task and initiation. Noted to perseverate on rubbing head. Completed sit to stand with RUE on sink with max A of 1, +2 present to pull pt's pants up. Pt left tilted back in TIS with safety belt alarm engaged, call bell in reach, and all immediate needs met.  Discharge Criteria: Patient will be discharged from OT if patient refuses treatment 3 consecutive times without medical reason, if treatment goals not met, if there is a change in medical status, if patient makes no progress towards goals or if patient  is discharged from hospital.  The above assessment, treatment plan, treatment alternatives and goals were discussed and mutually agreed upon: by patient and by family  Volanda Napoleon MS, OTR/L  01/03/2022, 12:54 PM

## 2022-01-03 NOTE — Progress Notes (Addendum)
PROGRESS NOTE   Subjective/Complaints: Working with SLP this AM. No new concerns or complaints this AM. Family in the room.   ROS No CP, SOB, no nausea or vomiting.  Denies pain.   Objective:   DG Swallowing Func-Speech Pathology  Result Date: 01/02/2022 Table formatting from the original result was not included. Objective Swallowing Evaluation: Type of Study: MBS-Modified Barium Swallow Study  Patient Details Name: Chad [name] MRN: [id number] Date of Birth: 10/13/1931 Today's Date: 01/02/2022 Time: SLP Start Time (ACUTE ONLY): 1100 -SLP Stop Time (ACUTE ONLY): 1123 SLP Time Calculation (min) (ACUTE ONLY): 23 min Past Medical History: Past Medical History: Diagnosis Date  Arthritis   Heart murmur   asa child   Hypertension   Stroke [facility])  Past Surgical History: Past Surgical History: Procedure Laterality Date  EYE SURGERY Bilateral 2022  JOINT REPLACEMENT    RADIOLOGY WITH ANESTHESIA N/A 01/01/2022  Procedure: Angiogram;  Surgeon: Luanne Bras, MD;  Location: [place];  Service: Radiology;  Laterality: N/A;  TONSILLECTOMY    TOTAL KNEE ARTHROPLASTY Right 03/30/2013  Procedure: RIGHT TOTAL KNEE ARTHROPLASTY;  Surgeon: Tobi Bastos, MD;  Location: [facility];  Service: Orthopedics;  Laterality: Right;  TOTAL KNEE ARTHROPLASTY Left 10/09/2020  Procedure: TOTAL KNEE ARTHROPLASTY;  Surgeon: Paralee Cancel, MD;  Location: [facility];  Service: Orthopedics;  Laterality: Left;  70 mins HPI: 89 yo male presenting to the ED on 5/28 with L sided weakness, facial droop, slurred speech, and fall. 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 PMH including hypothyroidism chronic pain syndromes, CKD, HTN, arthritis, bil TKA.  Subjective: alert, friendly  Recommendations for follow up therapy are one component of a multi-disciplinary discharge planning process, led by the attending physician.  Recommendations may  be updated based on patient status, additional functional criteria and insurance authorization. Assessment / Plan / Recommendation   01/02/2022  11:00 AM Clinical Impressions Clinical Impression Pt presents with oropharyngeal dysphagia characterized by reduced labial seal, impaired mastication, a pharyngeal delay and reduction in bolus cohesion, posterior bolus propulsion, tongue base retraction, anterior laryngeal movement, and pharyngeal constriction. He demonstrated left-sided anterior spillage, lingual residue, difficulty with A-P transport of solids, premature spillage to the valleculae and pyriform sinuses, and posterior pharyngeal wall residue. A liquid bolus was necessary to facilitate mastication and A-P transport of a nutrigrain bar. Liquid washes reduced pharyngeal residue to a more functional level and a left head turn was effective in reducing pyriform sinus residue with thin liquids, but pt exhibited notable difficulty maintaining this position. Penetration (PAS 3, 5) and aspiration (PAS 7, 8) were noted with thin liquids. Larger amounts of aspirate did trigger a cough which was mostly effective, but smaller quantities inconsistently triggered throat clearing which was inadequate for expulsion of material. No functional benefit was noted with prompted coughing. A dysphagia 1 diet with nectar thick liquids is recommended at this time. SLP will follow for dysphagia treatment. SLP Visit Diagnosis Dysphagia, oropharyngeal phase (R13.12) Impact on safety and function Mild aspiration risk     01/02/2022  11:00 AM Treatment Recommendations Treatment Recommendations Therapy as outlined in treatment plan below  01/02/2022  11:00 AM Prognosis Prognosis for Safe Diet Advancement Good   01/02/2022  11:00 AM Diet Recommendations SLP Diet Recommendations Dysphagia 1 (Puree) solids;Nectar thick liquid Liquid Administration via Cup;Straw Medication Administration Whole meds with puree Compensations Slow rate;Small  sips/bites;Monitor for anterior loss;Follow solids with liquid Postural Changes Seated upright at 90 degrees     01/02/2022  11:00 AM Other Recommendations Other Recommendations Order thickener from pharmacy Follow Up Recommendations Acute inpatient rehab (3hours/day) Assistance recommended at discharge Frequent or constant Supervision/Assistance Functional Status Assessment Patient has had a recent decline in their functional status and demonstrates the ability to make significant improvements in function in a reasonable and predictable amount of time.   01/02/2022  11:00 AM Frequency and Duration  Speech Therapy Frequency (ACUTE ONLY) min 2x/week Treatment Duration 2 weeks     01/02/2022  11:00 AM Oral Phase Oral Phase Impaired Oral - Nectar Cup Left anterior bolus loss;Reduced posterior propulsion;Lingual/palatal residue;Delayed oral transit;Decreased bolus cohesion;Premature spillage Oral - Nectar Straw Left anterior bolus loss;Reduced posterior propulsion;Lingual/palatal residue;Delayed oral transit;Decreased bolus cohesion;Premature spillage Oral - Thin Cup Left anterior bolus loss;Reduced posterior propulsion;Lingual/palatal residue;Delayed oral transit;Decreased bolus cohesion;Premature spillage Oral - Thin Straw Left anterior bolus loss;Reduced posterior propulsion;Lingual/palatal residue;Delayed oral transit;Decreased bolus cohesion;Premature spillage Oral - Puree Left anterior bolus loss;Reduced posterior propulsion;Lingual/palatal residue;Delayed oral transit;Decreased bolus cohesion;Weak lingual manipulation Oral - Mech Soft Left anterior bolus loss;Reduced posterior propulsion;Lingual/palatal residue;Delayed oral transit;Decreased bolus cohesion;Weak lingual manipulation;Impaired mastication Oral - Pill Left anterior bolus loss;Reduced posterior propulsion;Lingual/palatal residue;Delayed oral transit;Decreased bolus cohesion    01/02/2022  11:00 AM Pharyngeal Phase Pharyngeal Phase Impaired Pharyngeal- Thin  Cup Reduced tongue base retraction;Reduced anterior laryngeal mobility;Delayed swallow initiation-pyriform sinuses;Reduced pharyngeal peristalsis;Penetration/Aspiration during swallow;Penetration/Apiration after swallow;Pharyngeal residue - valleculae;Pharyngeal residue - pyriform;Pharyngeal residue - posterior pharnyx Pharyngeal Material enters airway, remains ABOVE vocal cords and not ejected out;Material enters airway, CONTACTS cords and not ejected out;Material enters airway, passes BELOW cords and not ejected out despite cough attempt by patient;Material enters airway, passes BELOW cords without attempt by patient to eject out (silent aspiration) Pharyngeal- Thin Straw Reduced tongue base retraction;Reduced anterior laryngeal mobility;Delayed swallow initiation-pyriform sinuses;Reduced pharyngeal peristalsis;Penetration/Aspiration during swallow;Penetration/Apiration after swallow;Pharyngeal residue - valleculae;Pharyngeal residue - pyriform;Pharyngeal residue - posterior pharnyx Pharyngeal Material enters airway, passes BELOW cords and not ejected out despite cough attempt by patient;Material enters airway, passes BELOW cords without attempt by patient to eject out (silent aspiration);Material enters airway, CONTACTS cords and then ejected out Pharyngeal- Puree Reduced tongue base retraction;Reduced anterior laryngeal mobility;Delayed swallow initiation-pyriform sinuses;Reduced pharyngeal peristalsis;Pharyngeal residue - valleculae;Pharyngeal residue - pyriform;Pharyngeal residue - posterior pharnyx Pharyngeal- Mechanical Soft Reduced tongue base retraction;Reduced anterior laryngeal mobility;Delayed swallow initiation-pyriform sinuses;Reduced pharyngeal peristalsis;Pharyngeal residue - valleculae;Pharyngeal residue - pyriform;Pharyngeal residue - posterior pharnyx Pharyngeal- Pill Reduced tongue base retraction;Reduced anterior laryngeal mobility;Delayed swallow initiation-pyriform sinuses;Reduced pharyngeal  peristalsis;Pharyngeal residue - valleculae;Pharyngeal residue - pyriform;Pharyngeal residue - posterior pharnyx    01/02/2022  11:00 AM Cervical Esophageal Phase  Cervical Esophageal Phase [facility] Shanika I. Hardin Negus, [place], [place] Office number [phone] Pager [phone] Horton Marshall 01/02/2022, 12:20 PM                     Recent Labs    01/02/22 0548 01/03/22 0538  WBC 8.3 5.9  HGB 8.2* 7.6*  HCT 24.7* 23.4*  PLT 154 170   Recent Labs    01/02/22 0548 01/03/22 0538  NA 147* 148*  K 4.1 3.5  CL 123* 122*  CO2 17*  20*  GLUCOSE 123* 110*  BUN 25* 29*  CREATININE 1.45* 1.48*  CALCIUM 8.1* 8.4*    Intake/Output Summary (Last 24 hours) at 01/03/2022 0736 Last data filed at 01/02/2022 1400 Gross per 24 hour  Intake --  Output 200 ml  Net -200 ml        Physical Exam: Vital Signs Blood pressure (!) 178/90, pulse 93, temperature 98.6 F (37 C), temperature source Oral, resp. rate 18, height 5\' 1"  (1.549 m), weight 61.3 kg, SpO2 100 %.    General: awake and alert, working with SLP HEENT: Head is normocephalic, atraumatic, hard of hearing Neck: Supple without JVD or lymphadenopathy Heart: Reg rate and rhythm. No murmurs rubs or gallops Chest: CTA bilaterally without wheezes, rales, or rhonchi; no distress Abdomen: Soft, non-tender, non-distended, bowel sounds positive. Extremities: No clubbing, cyanosis Psych: [name] affect is flat,  Pt is cooperative Skin: warm and dry Neuro:  Right gaze preference, L facial droop,  makes eye contact, dysarthria. Follows simple commands Musculoskeletal: 5/5 RUE and RLE, 0/5 RLE and RUE      Assessment/Plan: 1. Functional deficits which require 3+ hours per day of interdisciplinary therapy in a comprehensive inpatient rehab setting. Physiatrist is providing close team supervision and 24 hour management of active medical problems listed below. Physiatrist and rehab team continue to assess barriers to  discharge/monitor patient progress toward functional and medical goals  Care Tool:  Bathing              Bathing assist       Upper Body Dressing/Undressing Upper body dressing        Upper body assist      Lower Body Dressing/Undressing Lower body dressing            Lower body assist       Toileting Toileting    Toileting assist Assist for toileting: Total Assistance - Patient < 25%     Transfers Chair/bed transfer  Transfers assist  Chair/bed transfer activity did not occur: Safety/medical concerns        Locomotion Ambulation   Ambulation assist              Walk 10 feet activity   Assist           Walk 50 feet activity   Assist           Walk 150 feet activity   Assist           Walk 10 feet on uneven surface  activity   Assist           Wheelchair     Assist               Wheelchair 50 feet with 2 turns activity    Assist            Wheelchair 150 feet activity     Assist          Blood pressure (!) 178/90, pulse 93, temperature 98.6 F (37 C), temperature source Oral, resp. rate 18, height 5\' 1"  (1.549 m), weight 61.3 kg, SpO2 100 %. Medical Problem List and Plan: 1. Functional deficits secondary to right MCA scattered infarct due to right M2 occlusion, secondary to large vessel disease from high-grade right ICA stenosis status post right carotid angioplasty 5/31             -patient may  shower             -ELOS/Goals: 21- 24 days-  min to mod A SLP and min A PT/OT  -Start PT/OT/SLP with CIR 2.  Antithrombotics: -DVT/anticoagulation:  Pharmaceutical: Lovenox             -antiplatelet therapy: Aspirin 81 mg daily and Brilinta 90 mg twice daily 3. Pain Management: Hydrocodone as needed 4. Mood: Provide emotional support             -antipsychotic agents: N/A 5. Neuropsych: This patient is capable of making decisions on his own behalf. 6. Skin/Wound Care: Routine skin  checks 7. Fluids/Electrolytes/Nutrition: Routine in and outs with follow-up chemistry 8.  Dysphagia.  Dysphagia #1 nectar thick liquid.  Monitor hydration.  Follow-up speech therapy 9.  Hypertension.  Norvasc 5 mg daily, Tenormin 100 mg daily.  Monitor with increased mobility -Continue to monitor, permissive HTN 10.  Hyperlipidemia.  Crestor 11.  BPH.  Flomax 0.4 mg daily.  Check PVR 12.  CKD stage III.   -Cr overall stable at 1.48, continue to monitor 13. Constipation- will give Sorbitol if no BM -Large BM this AM 6/2 14, Dispo- family member can stay overnight 15. Hypernatremia mild, encourage fluid intake, repeat labs ordered 16. Hypoalbuminemia, ate 100 percent breakfast, continue to monitor    LOS: 1 days A FACE TO FACE EVALUATION WAS PERFORMED  Jennye Boroughs 01/03/2022, 7:36 AM

## 2022-01-03 NOTE — Progress Notes (Signed)
Inpatient Rehabilitation  Patient information reviewed and entered into eRehab system by Helayna Dun M. Betheny Suchecki, M.A., CCC/SLP, PPS Coordinator.  Information including medical coding, functional ability and quality indicators will be reviewed and updated through discharge.    

## 2022-01-03 NOTE — Plan of Care (Signed)
  Problem: RH Swallowing Goal: LTG Patient will participate in dysphagia therapy to increase swallow function with assistance (SLP) Description: LTG:  Patient will participate in dysphagia therapy to increase swallow function with assistance (SLP) Flowsheets (Taken 01/03/2022 0950) LTG: Pt will participate in dysphagia therapy to increase swallow function with assistance of (SLP): Supervision Goal: LTG Pt will demonstrate functional change in swallow as evidenced by bedside/clinical objective assessment (SLP) Description: LTG: Patient will demonstrate functional change in swallow as evidenced by bedside/clinical objective assessment (SLP) Flowsheets (Taken 01/03/2022 0950) LTG: Patient will demonstrate functional change in swallow as evidenced by bedside/clinical objective assessment: Oropharyngeal swallow   Problem: RH Expression Communication Goal: LTG Patient will increase speech intelligibility (SLP) Description: LTG: Patient will increase speech intelligibility at word/phrase/conversation level with cues, % of the time (SLP) Flowsheets (Taken 01/03/2022 0950) LTG: Patient will increase speech intelligibility (SLP): Supervision Level: Phrase   Problem: RH Problem Solving Goal: LTG Patient will demonstrate problem solving for (SLP) Description: LTG:  Patient will demonstrate problem solving for basic/complex daily situations with cues  (SLP) Flowsheets (Taken 01/03/2022 0950) LTG: Patient will demonstrate problem solving for (SLP): Basic daily situations LTG Patient will demonstrate problem solving for: Minimal Assistance - Patient > 75%   Problem: RH Memory Goal: LTG Patient will demonstrate ability for day to day (SLP) Description: LTG:   Patient will demonstrate ability for day to day recall/carryover during cognitive/linguistic activities with assist  (SLP) Flowsheets (Taken 01/03/2022 0950) LTG: Patient will demonstrate ability for day to day recall: New information LTG: Patient will  demonstrate ability for day to day recall/carryover during cognitive/linguistic activities with assist (SLP): Minimal Assistance - Patient > 75%   Problem: RH Attention Goal: LTG Patient will demonstrate this level of attention during functional activites (SLP) Description: LTG:  Patient will will demonstrate this level of attention during functional activites (SLP) Flowsheets (Taken 01/03/2022 0950) Patient will demonstrate during cognitive/linguistic activities the attention type of: Selective Patient will demonstrate this level of attention during cognitive/linguistic activities in: Home LTG: Patient will demonstrate this level of attention during cognitive/linguistic activities with assistance of (SLP): Minimal Assistance - Patient > 75%   Problem: RH Awareness Goal: LTG: Patient will demonstrate awareness during functional activites type of (SLP) Description: LTG: Patient will demonstrate awareness during functional activites type of (SLP) Flowsheets (Taken 01/03/2022 0950) Patient will demonstrate during cognitive/linguistic activities awareness type of: Emergent LTG: Patient will demonstrate awareness during cognitive/linguistic activities with assistance of (SLP): Minimal Assistance - Patient > 75%

## 2022-01-03 NOTE — Progress Notes (Signed)
Patient ID: RYLEY BACHTEL, male   DOB: Jul 05, 1932, 88 y.o.   MRN: [id number] Met with the patient and his daughter; Brayton Layman to review rehab process, team conference and plan of care. Reviewed secondary risks including HTN, HLD , CKD and prediabetes with medications and dietary modifications. Reviewed DAPT with ASA and Brilinta. Patient currently on D1 diet; reviewed dysphagia and pureed diet with dtr. Reviewed access code for my chart account and how to set up the account. Also reviewed list of resources for private duty  Continue to follow along to discharge to provide education to facilitate preparation for discharge to home with dtr. Daughter reported the family is discussing discharge destination plan. Margarito Liner

## 2022-01-03 NOTE — Progress Notes (Signed)
Inpatient Rehabilitation Center Individual Statement of Services  Patient Name:  Chad [name]  Date:  01/03/2022  Welcome to the [facility].  Our goal is to provide you with an individualized program based on your diagnosis and situation, designed to meet your specific needs.  With this comprehensive rehabilitation program, you will be expected to participate in at least 3 hours of rehabilitation therapies Monday-Friday, with modified therapy programming on the weekends.  Your rehabilitation program will include the following services:  Physical Therapy (PT), Occupational Therapy (OT), Speech Therapy (ST), 24 hour per day rehabilitation nursing, Therapeutic Recreaction (TR), Care Coordinator, Rehabilitation Medicine, Nutrition Services, and Pharmacy Services  Weekly team conferences will be held on wednesday to discuss your progress.  Your Inpatient Rehabilitation Care Coordinator will talk with you frequently to get your input and to update you on team discussions.  Team conferences with you and your family in attendance may also be held.  Expected length of stay: 4 weeks  Overall anticipated outcome: mod assist level  Depending on your progress and recovery, your program may change. Your Inpatient Rehabilitation Care Coordinator will coordinate services and will keep you informed of any changes. Your Inpatient Rehabilitation Care Coordinator's name and contact numbers are listed  below.  The following services may also be recommended but are not provided by the [facility]:   [place] will be made to provide these services after discharge if needed.  Arrangements include referral to agencies that provide these services.  Your insurance has been verified to be:  [employer]-Medicare Your primary doctor is:  [name]  Pertinent information will be shared with your doctor  and your insurance company.  Inpatient Rehabilitation Care Coordinator:  Erlene Quan, [place] or [phone]  Information discussed with and copy given to patient by: Elease Hashimoto, 01/03/2022, 12:40 PM

## 2022-01-03 NOTE — Plan of Care (Signed)
Problem: RH Balance Goal: LTG: Patient will maintain dynamic sitting balance (OT) Description: LTG:  Patient will maintain dynamic sitting balance with assistance during activities of daily living (OT) Flowsheets (Taken 01/03/2022 1221) LTG: Pt will maintain dynamic sitting balance during ADLs with: Contact Guard/Touching assist Goal: LTG Patient will maintain dynamic standing with ADLs (OT) Description: LTG:  Patient will maintain dynamic standing balance with assist during activities of daily living (OT)  Flowsheets (Taken 01/03/2022 1221) LTG: Pt will maintain dynamic standing balance during ADLs with: Minimal Assistance - Patient > 75%   Problem: Sit to Stand Goal: LTG:  Patient will perform sit to stand in prep for activites of daily living with assistance level (OT) Description: LTG:  Patient will perform sit to stand in prep for activites of daily living with assistance level (OT) Flowsheets (Taken 01/03/2022 1221) LTG: PT will perform sit to stand in prep for activites of daily living with assistance level: Minimal Assistance - Patient > 75%   Problem: RH Eating Goal: LTG Patient will perform eating w/assist, cues/equip (OT) Description: LTG: Patient will perform eating with assist, with/without cues using equipment (OT) Flowsheets (Taken 01/03/2022 1221) LTG: Pt will perform eating with assistance level of: Supervision/Verbal cueing   Problem: RH Grooming Goal: LTG Patient will perform grooming w/assist,cues/equip (OT) Description: LTG: Patient will perform grooming with assist, with/without cues using equipment (OT) Flowsheets (Taken 01/03/2022 1221) LTG: Pt will perform grooming with assistance level of: Supervision/Verbal cueing   Problem: RH Bathing Goal: LTG Patient will bathe all body parts with assist levels (OT) Description: LTG: Patient will bathe all body parts with assist levels (OT) Flowsheets (Taken 01/03/2022 1221) LTG: Pt will perform bathing with assistance  level/cueing: Moderate Assistance - Patient 50 - 74%   Problem: RH Dressing Goal: LTG Patient will perform upper body dressing (OT) Description: LTG Patient will perform upper body dressing with assist, with/without cues (OT). Flowsheets (Taken 01/03/2022 1221) LTG: Pt will perform upper body dressing with assistance level of: Minimal Assistance - Patient > 75% Goal: LTG Patient will perform lower body dressing w/assist (OT) Description: LTG: Patient will perform lower body dressing with assist, with/without cues in positioning using equipment (OT) Flowsheets (Taken 01/03/2022 1221) LTG: Pt will perform lower body dressing with assistance level of: Moderate Assistance - Patient 50 - 74%   Problem: RH Toileting Goal: LTG Patient will perform toileting task (3/3 steps) with assistance level (OT) Description: LTG: Patient will perform toileting task (3/3 steps) with assistance level (OT)  Flowsheets (Taken 01/03/2022 1221) LTG: Pt will perform toileting task (3/3 steps) with assistance level: Moderate Assistance - Patient 50 - 74%   Problem: RH Vision Goal: RH LTG Vision Theme park manager) Flowsheets (Taken 01/03/2022 1221) LTG: Vision Goals: Pt will locate ADL items on L of sink during morning routine with no more than min VCs.   Problem: RH Toilet Transfers Goal: LTG Patient will perform toilet transfers w/assist (OT) Description: LTG: Patient will perform toilet transfers with assist, with/without cues using equipment (OT) Flowsheets (Taken 01/03/2022 1221) LTG: Pt will perform toilet transfers with assistance level of: Moderate Assistance - Patient 50 - 74%   Problem: RH Tub/Shower Transfers Goal: LTG Patient will perform tub/shower transfers w/assist (OT) Description: LTG: Patient will perform tub/shower transfers with assist, with/without cues using equipment (OT) Flowsheets (Taken 01/03/2022 1221) LTG: Pt will perform tub/shower stall transfers with assistance level of: Moderate Assistance - Patient  50 - 74%   Problem: RH Attention Goal: LTG Patient will demonstrate this level of  attention during functional activites (OT) Description: LTG:  Patient will demonstrate this level of attention during functional activites  (OT) Flowsheets (Taken 01/03/2022 1221) Patient will demonstrate this level of attention during functional activites: Sustained Patient will demonstrate above attention level in the following environment: Controlled LTG: Patient will demonstrate this level of attention during functional activites (OT): Supervision   Problem: RH Pre-functional/Other (Specify) Goal: RH LTG OT (Specify) 1 Description: RH LTG OT (Specify) 1 Flowsheets (Taken 01/03/2022 1221) LTG: Other OT (Specify) 1: Pt will direct care of LUE positioning in bed/chair with no more than min VCs.

## 2022-01-04 LAB — BASIC METABOLIC PANEL
Anion gap: 6 (ref 5–15)
BUN: 29 mg/dL — ABNORMAL HIGH (ref 8–23)
CO2: 23 mmol/L (ref 22–32)
Calcium: 8.7 mg/dL — ABNORMAL LOW (ref 8.9–10.3)
Chloride: 121 mmol/L — ABNORMAL HIGH (ref 98–111)
Creatinine, Ser: 1.3 mg/dL — ABNORMAL HIGH (ref 0.61–1.24)
GFR, Estimated: 53 mL/min — ABNORMAL LOW (ref >60)
Glucose, Bld: 109 mg/dL — ABNORMAL HIGH (ref 70–99)
Potassium: 3.5 mmol/L (ref 3.5–5.1)
Sodium: 150 mmol/L — ABNORMAL HIGH (ref 135–145)

## 2022-01-04 NOTE — Progress Notes (Signed)
Physical Therapy Session Note  Patient Details  Name: Chad [name] MRN: [id number] Date of Birth: September 06, 1931  Today's Date: 01/04/2022 PT Individual Time: 1103-1159 PT Individual Time Calculation (min): 56 min   Short Term Goals: Week 1:  PT Short Term Goal 1 (Week 1): Pt will orient to midline in sitting with 50% of attempts. PT Short Term Goal 2 (Week 1): Pt will perform bed mobility with with consistent Max A. PT Short Term Goal 3 (Week 1): Pt will perform sit<>stand and find upright posture with MaxA +2. PT Short Term Goal 4 (Week 1): Pt will initiate gait training.  Skilled Therapeutic Interventions/Progress Updates:  Patient seated upright in TIS w/c on entrance to room. Patient alert and agreeable to PT session. LLE with knee straight and will require elevating leg rests for w/c to accommodate.  Pt perseverating on family members and where they are/ when they will arrive.   Patient with no pain complaint throughout session. Does c/o thirst and provided with nectar thick apple juice according to SLP's instructions and full supervision. Once instance of choking with pt able to perform throat clearing with   Therapeutic Activity: Transfers: Patient performed sit<>stand and stand pivot transfers throughout session with MaxA using footboard of bed and then Mod/ MaxA at // bars.  Pt unable to bring UB upright while standing at end of bed. Improved posture demonstrated at // bars. Provided max vc/ tc for technique.  Neuromuscular Re-ed: NMR facilitated during session with focus on standing balance/ posture, midline orientation and body proprioception. Pt guided in standing bouts requiring MaxA to maintain. Good Lknee extension with vc for LLE extension, upright posture. Guided in lateral weight shifting and minisquats.  NMR performed for improvements in motor control and coordination, balance, sequencing, judgement, and self confidence/ efficacy in performing all aspects of mobility at  highest level of independence.   Patient seated upright  in TIS w/c at end of session with brakes locked, belt alarm set, L arm propped for decreasing edema, and all needs within reach.   Therapy Documentation Precautions:  Precautions Precautions: Fall Precaution Comments: dense L hemi/neglect Restrictions Weight Bearing Restrictions: No General:   Vital Signs: Therapy Vitals Temp: 98 F (36.7 C) Temp Source: Oral Pulse Rate: 69 Resp: 16 BP: (!) 129/58 Patient Position (if appropriate): Sitting Pain: Pain Assessment Pain Scale: Faces Pain Score: 0-No pain Faces Pain Scale: No hurt  Therapy/Group: Individual Therapy  Alger Simons PT, DPT 01/04/2022, 1:44 PM

## 2022-01-04 NOTE — Progress Notes (Signed)
Occupational Therapy Session Note  Patient Details  Name: Chad [name] MRN: [id number] Date of Birth: 1932/07/07  Today's Date: 01/04/2022 OT Individual Time: 0801-0909 OT Individual Time Calculation (min): 68 min    Short Term Goals: Week 1:  OT Short Term Goal 1 (Week 1): Pt will maintain static sitting balance EOB with CGA > 5 min. OT Short Term Goal 2 (Week 1): Pt will don shirt with mod A. OT Short Term Goal 3 (Week 1): Pt will complete >2 grooming tasks seated at sink with S. OT Short Term Goal 4 (Week 1): Pt will complete toilet transfer with maxA and LRAD.  Skilled Therapeutic Interventions/Progress Updates:    Pt received asleep in bed but easily awakened to voice, no c/o pain, agreeable to therapy. Session focus on self-care retraining, activity tolerance, transfer retraining in prep for improved ADL/IADL/func mobility performance + decreased caregiver burden. Came to sitting EOB with max A to progress BLE off bed and to lift trunk. Consistent min A for static sitting balance. Stedy transfer > TIS with max A to power up and maintain midline.  Pt doffed shirt with max A to remove over head and remove BUE. Bathed face and UB with mod A to incorporate LUE and for thoroughness. Continues to primarily perseverate on washing face. Max A to don shirt to thread LUE/head and pull down in back. Total A  of 2 for LBD, max A to stand at sink.   Stood in stedy to attempt to urinate with max A with no results. Primarily needs assist due to L lean.  Pt completed the following games on BITS to target L visual scanning while seated using drumstick as stylus in RUE.  Game: Single Target Accuracy: 83/61% Reaction Time: 2.34 seconds Duration Time: 2 min Hits: 51   Pt left seated in TIS with NP present with safety belt alarm engaged, call bell in reach, and all immediate needs met.    Therapy Documentation Precautions:  Precautions Precautions: Fall Precaution Comments: dense L  hemi/neglect Restrictions Weight Bearing Restrictions: No  Pain: no c/o throughout   ADL: See Care Tool for more details.   Therapy/Group: Individual Therapy  Volanda Napoleon MS, OTR/L  01/04/2022, 6:52 AM

## 2022-01-04 NOTE — Progress Notes (Signed)
Speech Language Pathology Daily Session Note  Patient Details  Name: Chad [name] MRN: [id number] Date of Birth: [birth year]  Today's Date: 01/04/2022 SLP Individual Time: 1416-1500 SLP Individual Time Calculation (min): 44 min  Short Term Goals: Week 1: SLP Short Term Goal 1 (Week 1): Pt will tolerate Dys 1/NTL diet with minimal s/s aspiration provided mod A cues for swallow precautions SLP Short Term Goal 2 (Week 1): Pt will tolerate trials of Dys 2 and thin liquids with SLP only with minimal s/s aspiration to determine appropriateness for follow up MBS or diet upgrade SLP Short Term Goal 3 (Week 1): Pt will participate in further cognitive testing to determine current impairments and guide POC SLP Short Term Goal 4 (Week 1): Pt will increase sustained attention during functional tasks to 5-7 minutes provided mod A cues SLP Short Term Goal 5 (Week 1): Pt will solve basic problems during functional tasks provided mod A cues SLP Short Term Goal 6 (Week 1): Pt will utilize speech intelligibility strategies as trained provided max A cues to achieve 70% intelligibility at phrase level  Skilled Therapeutic Interventions: Pt seen for skilled ST with focus on cognitive and swallowing goals, pt upright in wheelchair and agreeable to therapeutic tasks. Pt agreeable to PO trials of thin liquid, ice chips and NTL. Pt initially trialing thin liquid via cup with significant loss of bolus from cup during self-feeding and L side of mouth, straw continues to be most beneficial for liquid intake at this time. Pt demonstrating overt s/s aspiration 100% of time with thin liquids and ice chip trials, reducing bolus size and verbal cues for effortful swallow ineffective in reducing s/s aspiration. Pt also demonstrates s/s aspiration with NTL with successive straw sips, no overt s/s aspiration with single sips. Informed nursing pt is not to have liquids at bedside d/t aspiration risk and poor self-monitoring, staff to  offer liquids throughout the day with supervision. SLP administering SLUMS exam with patient scoring 6/30 (+year, +state, +6 animals/minute, +ID largest shape and +1 story element recall). Pt demonstrates significantly delayed processing and response time during session, frequent repetition of prompts/questions required. Pt left in wheelchair with alarm belt intact and all needs within reach. Cont ST POC.  Pain Pain Assessment Pain Scale: 0-10 Pain Score: 0-No pain Faces Pain Scale: No hurt  Therapy/Group: Individual Therapy  Dewaine Conger 01/04/2022, 2:59 PM

## 2022-01-04 NOTE — Progress Notes (Signed)
Physical Therapy Session Note  Patient Details  Name: Chad [name] MRN: [id number] Date of Birth: 06/22/1932  Today's Date: 01/04/2022 PT Individual Time: 1330-1400 PT Individual Time Calculation (min): 30 min   Short Term Goals: Week 1:  PT Short Term Goal 1 (Week 1): Pt will orient to midline in sitting with 50% of attempts. PT Short Term Goal 2 (Week 1): Pt will perform bed mobility with with consistent Max A. PT Short Term Goal 3 (Week 1): Pt will perform sit<>stand and find upright posture with MaxA +2. PT Short Term Goal 4 (Week 1): Pt will initiate gait training.  Skilled Therapeutic Interventions/Progress Updates:   Pt received sitting in WC with RN assisting pt with meal. RN requesting additional time to complete meal. PT returned in 30 minutes and RN leaving and agreeable to PT.   Pt transported to [employer]. Reciprocal movement training 5 x 1 min with mod-max assist from PT for full ROM on the LLE throughout with cues for initiation and sustained attention to task.  Sit<>stand with max assist at rail in hall with max cues for posture and terminal knee extension.   Gait training at rail in hall x 11f with PT advancing the LLE. Pt able to engaged hip/knee extension with tactile cues on the LLE to advance on the RLE throughout.   [facility] to [facility] and all needs met.          Therapy Documentation Precautions:  Precautions Precautions: Fall Precaution Comments: dense L hemi/neglect Restrictions Weight Bearing Restrictions: No General: PT Amount of Missed Time (min): 30 Minutes PT Missed Treatment Reason: Nursing care Vital Signs: Therapy Vitals Temp: (!) 97.5 F (36.4 C) Temp Source: Oral Pulse Rate: 63 Resp: 16 BP: (!) 143/64 Patient Position (if appropriate): Sitting Oxygen Therapy SpO2: 99 % O2 Device: Room Air Pain: Pain Assessment Pain Scale: 0-10 Pain Score: 0-No pain  Therapy/Group: Individual  Therapy  ALorie Phenix6/10/2021, 6:12 PM

## 2022-01-04 NOTE — Evaluation (Signed)
Physical Therapy Assessment and Plan  Patient Details  Name: Chad [name] MRN: [id number] Date of Birth: 08-13-1931  PT Diagnosis: Abnormal posture, Coordination disorder, Difficulty walking, Hemiparesis non-dominant, Hemiplegia non-dominant, Hypertonia, Impaired cognition, Low back pain, and Muscle weakness Rehab Potential: Fair ELOS: 4 weeks   Today's Date: 6/322023 PT Individual Time: 7741-2878 PT Individual Time Calculation (min): 74 min    Hospital Problem: Principal Problem:   Right middle cerebral artery stroke [facility])   Past Medical History:  Past Medical History:  Diagnosis Date   Arthritis    Heart murmur    asa child    Hypertension    Stroke [facility])    Past Surgical History:  Past Surgical History:  Procedure Laterality Date   EYE SURGERY Bilateral 2022   IR ANGIO INTRA EXTRACRAN SEL COM CAROTID INNOMINATE UNI R MOD SED  01/01/2022   IR CT HEAD LTD  01/01/2022   IR PTA INTRACRANIAL  01/01/2022   JOINT REPLACEMENT     RADIOLOGY WITH ANESTHESIA N/A 01/01/2022   Procedure: Angiogram;  Surgeon: Luanne Bras, MD;  Location: [facility];  Service: Radiology;  Laterality: N/A;   TONSILLECTOMY     TOTAL KNEE ARTHROPLASTY Right 03/30/2013   Procedure: RIGHT TOTAL KNEE ARTHROPLASTY;  Surgeon: Tobi Bastos, MD;  Location: [facility];  Service: Orthopedics;  Laterality: Right;   TOTAL KNEE ARTHROPLASTY Left 10/09/2020   Procedure: TOTAL KNEE ARTHROPLASTY;  Surgeon: Paralee Cancel, MD;  Location: [facility];  Service: Orthopedics;  Laterality: Left;  70 mins    Assessment & Plan Clinical Impression: Patient is a 88 y.o. right-handed male with history of hypertension, CKD stage III, hyperlipidemia, mild aortic regurgitation, bilateral total knee arthroplasty, chronic pain using hydrocodone 7.5-325 mg 3 times daily as needed and BPH maintained on Flomax.  Per chart review patient lives alone independent with ADLs using a straight cane for mobility.  1 level apartment.  He does have  family in the area.  Presented 12/29/2021 with acute onset of left-sided weakness resulting in a fall.  Denied loss of consciousness.  Cranial CT scan showed age-indeterminate lacunar infarction within the right basal ganglia.  Age-indeterminate small vessel ischemic changes throughout the periventricular white matter, favor chronic.  No acute hemorrhage.  CT cervical spine negative for fracture.  Extensive multilevel cervical degenerative changes.  MRI/MRA showed cluster of small cortical and white matter infarcts within the right MCA distribution.  MRI focal high-grade narrowing at the right M2 level where there was calcification by CT.  CTA of the neck apparent occlusion of proximal right M2 MCA branch in the region of calcification possibly calcified embolus or calcified plaque.  Extensive calcific atherosclerosis of the right carotid bifurcation with approximately 80% stenosis of the proximal ICA.  Potentially severe stenosis of the nondominant right vertebral artery origin.  Patient did not receive tPA.  Echocardiogram with ejection fraction of 55 to 60% no wall motion abnormalities.  Admission chemistries unremarkable except glucose 119 BUN 35 creatinine 1.46, CK within normal limits, troponin 76, hemoglobin A1c 6.1, lactic acid within normal limits.  Hospital course interventional radiology consulted for extensive calcified atherosclerosis of the right ICA bifurcation with 80% stenosis status post right carotid angioplasty 5/31.  Placed on full dose aspirin and Brilinta therapy for CVA prophylaxis.  Plan is to follow-up outpatient interventional radiology 1 to 2 months if restenosis to consider right CEA.  Maintained on Lovenox for DVT prophylaxis.  Blood pressure monitored initially maintained on Cleviprex.  He is currently maintained on  a dysphagia #1 pudding thick liquid diet advanced to nectar liquids 01/02/2022.  Therapy evaluations completed due to patient left-sided weakness and dysphagia was admitted  for a comprehensive rehab program.  Patient transferred to CIR on 01/02/2022 .   Patient currently requires max with mobility secondary to muscle weakness, decreased cardiorespiratoy endurance, impaired timing and sequencing, abnormal tone, and unbalanced muscle activation, decreased visual perceptual skills, decreased midline orientation, decreased attention to left, left side neglect, and decreased motor planning, decreased initiation, decreased attention, decreased awareness, decreased problem solving, decreased safety awareness, decreased memory, and delayed processing, and decreased sitting balance, decreased standing balance, decreased postural control, hemiplegia, and decreased balance strategies.  Prior to hospitalization, patient was modified independent  with mobility and lived with Alone in a Center home.  Home access is 1Level entry, Stairs to enter.  Patient will benefit from skilled PT intervention to maximize safe functional mobility, minimize fall risk, and decrease caregiver burden for planned discharge home with 24 hour assist.  Anticipate patient will benefit from follow up [facility] at discharge.  PT - End of Session Activity Tolerance: Tolerates 30+ min activity with multiple rests Endurance Deficit: Yes PT Assessment Rehab Potential (ACUTE/IP ONLY): Fair PT Barriers to Discharge: Inaccessible home environment;Decreased caregiver support;Incontinence;Insurance for SNF coverage;Nutrition means PT Patient demonstrates impairments in the following area(s): Balance;Behavior;Edema;Endurance;Motor;Nutrition;Pain;Perception;Safety;Sensory PT Transfers Functional Problem(s): Bed Mobility;Bed to Chair;Car;Furniture PT Locomotion Functional Problem(s): Ambulation;Wheelchair Mobility PT Plan PT Intensity: Minimum of 1-2 x/day ,45 to 90 minutes PT Frequency: 5 out of 7 days PT Duration Estimated Length of Stay: 4 weeks PT Treatment/Interventions: Ambulation/gait training;Balance/vestibular  training;Cognitive remediation/compensation;Community reintegration;Discharge planning;Disease management/prevention;Functional mobility training;Patient/family education;Splinting/orthotics;Therapeutic Exercise;Visual/perceptual remediation/compensation;UE/LE Coordination activities;Therapeutic Activities;Skin care/wound management;Pain management;Functional electrical stimulation;DME/adaptive equipment instruction;Neuromuscular re-education;Psychosocial support;Stair training;UE/LE Strength taining/ROM;Wheelchair propulsion/positioning PT Transfers Anticipated Outcome(s): MinA PT Locomotion Anticipated Outcome(s): Min/ ModA PT Recommendation Follow Up Recommendations: 24 hour supervision/assistance;Home health PT Patient destination: Home Equipment Recommended: To be determined   PT Evaluation Precautions/Restrictions Precautions Precautions: Fall Precaution Comments: dense L hemi/neglect Restrictions Weight Bearing Restrictions: No General   Vital Signs  Pain Pain Assessment Pain Scale: Faces Pain Score: 0-No pain Faces Pain Scale: No hurt Pain Interference Pain Interference Pain Effect on Sleep: 1. Rarely or not at all Pain Interference with Therapy Activities: 1. Rarely or not at all Pain Interference with Day-to-Day Activities: 1. Rarely or not at all Home Living/Prior [place] Available Help at Discharge: Family;Available 24 hours/day Type of Home: Apartment Home Access: Level entry;Stairs to enter Entrance Stairs-Number of Steps: 1 Entrance Stairs-Rails: None Home Layout: One level Bathroom Shower/Tub: Chiropodist: Standard Additional Comments: Dtr's home has 3 STE with BHR  Lives With: Alone Prior Function Level of Independence: Requires assistive device for independence (SPC indoors, RW outdoors)  Able to Take Stairs?: Yes Driving: No Vocation: Retired Vision/Perception  Vision - Risk analyst: Within Engineer, site Range of Motion: Restricted on the left Alignment/Gaze Preference: Head turned;Gaze right Tracking/Visual Pursuits: Decreased smoothness of horizontal tracking;Decreased smoothness of vertical tracking;Requires cues, head turns, or add eye shifts to track;Unable to hold eye position out of midline Saccades: Decreased speed of saccadic movement;Additional head turns occurred during testing Convergence: Impaired - to be further tested in functional context Perception Perception: Impaired Inattention/Neglect: Does not attend to left visual field;Does not attend to left side of body Praxis Praxis: Impaired Praxis Impairment Details: Motor planning;Perseveration;Initiation  Cognition Overall Cognitive Status: Impaired/Different from baseline Arousal/Alertness: Awake/alert Orientation Level: Oriented to person;Oriented to place Attention: Sustained;Selective Sustained  Attention: Impaired Selective Attention: Impaired Memory: Impaired Memory Impairment: Retrieval deficit;Decreased recall of new information Awareness: Impaired Awareness Impairment: Intellectual impairment Problem Solving: Impaired Executive Function: Decision Making;Initiating;Self Monitoring;Self Correcting;Sequencing Sequencing: Impaired Decision Making: Impaired Initiating: Impaired Self Monitoring: Impaired Self Correcting: Impaired Behaviors: Perseveration Safety/Judgment: Impaired Sensation Sensation Light Touch: Impaired by gross assessment Proprioception: Impaired by gross assessment Coordination Gross Motor Movements are Fluid and Coordinated: No Fine Motor Movements are Fluid and Coordinated: No Coordination and Movement Description: dense L hemi with sensorimotor deficits Heel Shin Test: unable to follow instructions and perform bilaterally Motor  Motor Motor: Hemiplegia;Abnormal tone Motor - Skilled Clinical Observations: dense L hemi with sensorimotor deficits   Trunk/Postural  Assessment  Cervical Assessment Cervical Assessment: Exceptions to WFL (diff to assess with R cervical rotation and hold of head turn to R) Thoracic Assessment Thoracic Assessment: Exceptions to [facility] (slight R rotation with L shoulder protraction) Lumbar Assessment Lumbar Assessment: Exceptions to [facility] (posterior pelvic tilt) Postural Control Postural Control: Deficits on evaluation (poor trunk control with forward and L lean, push to L from RUE)  Balance Balance Balance Assessed: Yes Static Sitting Balance Static Sitting - Balance Support: Feet supported;No upper extremity supported Static Sitting - Level of Assistance: 2: Max assist Dynamic Sitting Balance Dynamic Sitting - Balance Support: During functional activity;Feet supported Dynamic Sitting - Level of Assistance: 2: Max assist Sitting balance - Comments: unable to correct fwd and/ or L lean/ push from R in order to return to midline, unable to find L side Static Standing Balance Static Standing - Balance Support: During functional activity;Right upper extremity supported Static Standing - Level of Assistance: 2: Max assist Extremity Assessment      RLE Assessment RLE Assessment: Exceptions to [facility] General Strength Comments: unable to follow instructions for MMT; functionally 3+ to 4-/ 5 LLE Assessment LLE Assessment: Exceptions to [facility] General Strength Comments: unable to follow instructions for MMT; functionally 3- to 3/ 5 with increased tone in knee flexion  Care Tool Care Tool Bed Mobility Roll left and right activity   Roll left and right assist level: Total Assistance - Patient < 25%    Sit to lying activity   Sit to lying assist level: Total Assistance - Patient < 25%    Lying to sitting on side of bed activity   Lying to sitting on side of bed assist level: the ability to move from lying on the back to sitting on the side of the bed with no back support.: Total Assistance - Patient < 25%     Care Tool  Transfers Sit to stand transfer   Sit to stand assist level: Maximal Assistance - Patient 25 - 49%    Chair/bed transfer   Chair/bed transfer assist level: Dependent - mechanical lift     Toilet transfer Toilet transfer activity did not occur: Safety/medical concerns      Scientist, product/process development transfer activity did not occur: Safety/medical concerns        Care Tool Locomotion Ambulation Ambulation activity did not occur: Safety/medical concerns        Walk 10 feet activity Walk 10 feet activity did not occur: Safety/medical concerns       Walk 50 feet with 2 turns activity Walk 50 feet with 2 turns activity did not occur: Safety/medical concerns      Walk 150 feet activity Walk 150 feet activity did not occur: Safety/medical concerns      Walk 10 feet on uneven surfaces activity Walk 10 feet on  uneven surfaces activity did not occur: Safety/medical concerns      Stairs Stair activity did not occur: Safety/medical concerns        Walk up/down 1 step activity Walk up/down 1 step or curb (drop down) activity did not occur: Safety/medical concerns      Walk up/down 4 steps activity Walk up/down 4 steps activity did not occur: Safety/medical concerns      Walk up/down 12 steps activity Walk up/down 12 steps activity did not occur: Safety/medical concerns      Pick up small objects from floor Pick up small object from the floor (from standing position) activity did not occur: Safety/medical concerns      Wheelchair Is the patient using a wheelchair?: No Type of Wheelchair: Manual Wheelchair activity did not occur: Safety/medical concerns      Wheel 50 feet with 2 turns activity Wheelchair 50 feet with 2 turns activity did not occur: Safety/medical concerns    Wheel 150 feet activity Wheelchair 150 feet activity did not occur: Safety/medical concerns      Refer to Care Plan for Long Term Goals  SHORT TERM GOAL WEEK 1 PT Short Term Goal 1 (Week 1): Pt will orient to  midline in sitting with 50% of attempts. PT Short Term Goal 2 (Week 1): Pt will perform bed mobility with with consistent Max A. PT Short Term Goal 3 (Week 1): Pt will perform sit<>stand and find upright posture with MaxA +2. PT Short Term Goal 4 (Week 1): Pt will initiate gait training.  Recommendations for other services: None   Skilled Therapeutic Intervention Mobility Transfers Transfers: Sit to Stand;Stand to Sit;Transfer via [place] to Stand: Maximal Assistance - Patient 25-49% Stand to Sit: Maximal Assistance - Patient 25-49% Transfer (Assistive device): Other (Comment) (R handhold on footboard of bed) Transfer via Lift Equipment: Animal nutritionist: No Gait Gait: No Stairs / Additional Locomotion Stairs: No Wheelchair Mobility Wheelchair Mobility: No  Skilled Interventions: Patient seated upright in TIS w/c on entrance to room. Dtr also present. Patient alert and agreeable to PT session.   Patient with no pain complaint throughout session.  Therapeutic Activity: Transfers: Attempted sit<>stand transfer to RW with pt unable to power up with MaxA. Performed with RUE supported on footboard of bed and with pull-to-stand technique. Unable to pull trunk upright into full upright stance. Heavy L lean as well as push to L with RUE. Unable to find midline with vc/ tc. Provided max multimodal cues for sequence, technique, posture.  Neuromuscular Re-ed: NMR facilitated during session with focus on sitting balance, standing balance/ tolerance, midline orientation, proprioception. Pt guided in sitting posture with supported and unsupported positioning. Heavy forward lean with inability to correct without ModA. Leans L with inability to correct and when allowed to use RUE, demos both ability to pull and increased push, especially with attempt to scoot or stand. Attempted rise to stand with use of RW but attempts to pull at [facility]. With underhand grip on footboard, pt  able to pull into standing with MaxA. Some L quad activity noted. With overhand grip, pt pushes heavily to L requiring MaxA to maintain upright stance. ModA to descend to sit with cues for controlling descent. NMR performed for improvements in motor control and coordination, balance, sequencing, judgement, and self confidence/ efficacy in performing all aspects of mobility at highest level of independence.   Patient seated in TIS w/c at end of session with brakes locked, belt alarm set, and all needs  within reach. While seated retrograde massage provided to LUE in attempt to decrease swelling. UE positioned for gravity assist of edema reduction with 2 pillows. Educated dtr on positioning.    Discharge Criteria: Patient will be discharged from PT if patient refuses treatment 3 consecutive times without medical reason, if treatment goals not met, if there is a change in medical status, if patient makes no progress towards goals or if patient is discharged from hospital.  The above assessment, treatment plan, treatment alternatives and goals were discussed and mutually agreed upon: by patient and by family  Alger Simons PT, DPT 01/04/2022, 6:07 PM

## 2022-01-04 NOTE — Plan of Care (Signed)
  Problem: RH Balance Goal: LTG Patient will maintain dynamic sitting balance (PT) Description: LTG:  Patient will maintain dynamic sitting balance with assistance during mobility activities (PT) Flowsheets (Taken 01/03/2022 1803) LTG: Pt will maintain dynamic sitting balance during mobility activities with:: Contact Guard/Touching assist Goal: LTG Patient will maintain dynamic standing balance (PT) Description: LTG:  Patient will maintain dynamic standing balance with assistance during mobility activities (PT) Flowsheets (Taken 01/03/2022 1803) LTG: Pt will maintain dynamic standing balance during mobility activities with:: Minimal Assistance - Patient > 75%   Problem: RH Bed Mobility Goal: LTG Patient will perform bed mobility with assist (PT) Description: LTG: Patient will perform bed mobility with assistance, with/without cues (PT). Flowsheets (Taken 01/03/2022 1803) LTG: Pt will perform bed mobility with assistance level of: Minimal Assistance - Patient > 75%   Problem: RH Bed to Chair Transfers Goal: LTG Patient will perform bed/chair transfers w/assist (PT) Description: LTG: Patient will perform bed to chair transfers with assistance (PT). Flowsheets (Taken 01/03/2022 1803) LTG: Pt will perform Bed to Chair Transfers with assistance level: Minimal Assistance - Patient > 75%   Problem: RH Car Transfers Goal: LTG Patient will perform car transfers with assist (PT) Description: LTG: Patient will perform car transfers with assistance (PT). Flowsheets (Taken 01/03/2022 1803) LTG: Pt will perform car transfers with assist:: Moderate Assistance - Patient 50 - 74%   Problem: RH Furniture Transfers Goal: LTG Patient will perform furniture transfers w/assist (OT/PT) Description: LTG: Patient will perform furniture transfers  with assistance (OT/PT). Flowsheets (Taken 01/03/2022 1803) LTG: Pt will perform furniture transfers with assist:: Moderate Assistance - Patient 50 - 74%   Problem: RH  Ambulation Goal: LTG Patient will ambulate in controlled environment (PT) Description: LTG: Patient will ambulate in a controlled environment, # of feet with assistance (PT). Flowsheets (Taken 01/03/2022 1803) LTG: Pt will ambulate in controlled environ  assist needed:: Moderate Assistance - Patient 50 - 74% LTG: Ambulation distance in controlled environment: 60 ft using LRAD   Problem: RH Wheelchair Mobility Goal: LTG Patient will propel w/c in controlled environment (PT) Description: LTG: Patient will propel wheelchair in controlled environment, # of feet with assist (PT) Flowsheets (Taken 01/03/2022 1803) LTG: Pt will propel w/c in controlled environ  assist needed:: Supervision/Verbal cueing LTG: Propel w/c distance in controlled environment: 150 ft Goal: LTG Patient will propel w/c in home environment (PT) Description: LTG: Patient will propel wheelchair in home environment, # of feet with assistance (PT). Flowsheets (Taken 01/03/2022 1803) LTG: Pt will propel w/c in home environ  assist needed:: Contact Guard/Touching assist Distance: wheelchair distance in controlled environment: 150 LTG: Propel w/c distance in home environment: at least 50 ft   Problem: RH Stairs Goal: LTG Patient will ambulate up and down stairs w/assist (PT) Description: LTG: Patient will ambulate up and down # of stairs with assistance (PT) Flowsheets (Taken 01/03/2022 1803) LTG: Pt will ambulate up/down stairs assist needed:: Moderate Assistance - Patient 50 - 74% LTG: Pt will  ambulate up and down number of stairs: at least one step using LRAD in order to enter/ exit home

## 2022-01-04 NOTE — Progress Notes (Signed)
PROGRESS NOTE   Subjective/Complaints: Patient seen in bed, no overnight complaints or concerns.  ROS: Bo SOB, CP, nausea, vomiting, or pain.    Objective:   No results found. Recent Labs    01/02/22 0548 01/03/22 0538  WBC 8.3 5.9  HGB 8.2* 7.6*  HCT 24.7* 23.4*  PLT 154 170   Recent Labs    01/03/22 0538 01/04/22 0635  NA 148* 150*  K 3.5 3.5  CL 122* 121*  CO2 20* 23  GLUCOSE 110* 109*  BUN 29* 29*  CREATININE 1.48* 1.30*  CALCIUM 8.4* 8.7*    Intake/Output Summary (Last 24 hours) at 01/04/2022 1139 Last data filed at 01/04/2022 0200 Gross per 24 hour  Intake 800 ml  Output --  Net 800 ml        Physical Exam: Vital Signs Blood pressure (!) 129/58, pulse 69, temperature 98 F (36.7 C), temperature source Oral, resp. rate 16, height 5\' 1"  (1.549 m), weight 61.3 kg, SpO2 100 %.   General: Alert and oriented x 3, No apparent distress HEENT: Head is normocephalic, atraumatic, PERRLA, EOMI, sclera anicteric, oral mucosa pink and moist.  Neck: Supple without JVD or lymphadenopathy Heart: Reg rate and rhythm. No murmurs, rubs, or gallops Chest: CTA bilaterally without wheezes, rales, or rhonchi Abdomen: Soft, non-tender, non-distended, bowel sounds positive. Extremities: No clubbing, cyanosis, or edema. Pulses are 2+ Psych: [name] affect is appropriate. Pt is cooperative Skin: Clean and intact without signs of breakdown Neuro: Right gaze preference, lt facial droop present, pt makes good eye contact but has dysarthria.  Can follow simple commands. Moderate dysarthria, L inattention. Decreased to light touch LUE/LLE and left face. Musculoskeletal: 5/5 RUE/RLE, 0/5 LUE/LLE  Assessment/Plan: 1. Functional deficits which require 3+ hours per day of interdisciplinary therapy in a comprehensive inpatient rehab setting. Physiatrist is providing close team supervision and 24 hour management of active medical  problems listed below. Physiatrist and rehab team continue to assess barriers to discharge/monitor patient progress toward functional and medical goals  Care Tool:  Bathing    Body parts bathed by patient: Face, Left arm, Right upper leg   Body parts bathed by helper: Right arm, Chest, Abdomen, Left upper leg, Right lower leg, Left lower leg     Bathing assist Assist Level: Total Assistance - Patient < 25%     Upper Body Dressing/Undressing Upper body dressing   What is the patient wearing?: Pull over shirt    Upper body assist Assist Level: Total Assistance - Patient < 25%    Lower Body Dressing/Undressing Lower body dressing      What is the patient wearing?: Pants     Lower body assist Assist for lower body dressing: 2 Helpers     Toileting Toileting    Toileting assist Assist for toileting: Total Assistance - Patient < 25%     Transfers Chair/bed transfer  Transfers assist  Chair/bed transfer activity did not occur: Safety/medical concerns  Chair/bed transfer assist level: Dependent - mechanical lift     Locomotion Ambulation   Ambulation assist   Ambulation activity did not occur: Safety/medical concerns          Walk 10 feet  activity   Assist  Walk 10 feet activity did not occur: Safety/medical concerns        Walk 50 feet activity   Assist Walk 50 feet with 2 turns activity did not occur: Safety/medical concerns         Walk 150 feet activity   Assist Walk 150 feet activity did not occur: Safety/medical concerns         Walk 10 feet on uneven surface  activity   Assist Walk 10 feet on uneven surfaces activity did not occur: Safety/medical concerns         Wheelchair     Assist Is the patient using a wheelchair?: No Type of Wheelchair: Manual Wheelchair activity did not occur: Safety/medical concerns         Wheelchair 50 feet with 2 turns activity    Assist    Wheelchair 50 feet with 2 turns  activity did not occur: Safety/medical concerns       Wheelchair 150 feet activity     Assist  Wheelchair 150 feet activity did not occur: Safety/medical concerns       Blood pressure (!) 129/58, pulse 69, temperature 98 F (36.7 C), temperature source Oral, resp. rate 16, height 5\' 1"  (1.549 m), weight 61.3 kg, SpO2 100 %.  Medical Problem List and Plan: 1. 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  Antithrombotics: -DVT/anticoagulation:  Pharmaceutical: Lovenox             -antiplatelet therapy: Aspirin 81 mg daily and Brilinta 90 mg twice daily 6/3 Continue above regimen 3. Pain Management: Hydrocodone as needed 6/3 No issues with pain  today 4. Mood: Provide emotional support             -antipsychotic agents: N/A 5. Neuropsych: This patient is capable of making decisions on his own behalf. 6. Skin/Wound Care: Routine skin checks 7. Fluids/Electrolytes/Nutrition: Routine in and outs with follow-up chemistry 8.  Dysphagia.  Dysphagia #1 nectar thick liquid.  Monitor hydration.  Follow-up speech therapy 9.  Hypertension.  Norvasc 5 mg daily, Tenormin 100 mg daily.  Monitor with increased mobility -Continue to monitor, permissive HTN 6/3 SBP ranging from 129-150 today, continue Norvasc 5 mg daily, 100 mg Tenormin daily.    01/04/2022   11:01 AM 01/04/2022    4:18 AM 01/03/2022    7:45 PM  Vitals with BMI  Systolic [phone]  Diastolic 58 97 76  Pulse 69 71 89    10.  Hyperlipidemia.  Crestor 11.  BPH.  Flomax 0.4 mg daily.  Check PVR 12.  CKD stage III.   -Cr overall stable at 1.48, continue to monitor 13. Constipation- will give Sorbitol if no BM -Large BM this AM  6/2 14, Dispo- family member can stay overnight 15. Hypernatremia mild, encourage fluid intake, repeat labs ordered 16. Hypoalbuminemia, ate 100 percent breakfast, continue to monitor 6/3 Continues to eat well, CTM      LOS: 2 days A FACE TO FACE EVALUATION WAS PERFORMED  Luetta Nutting 01/04/2022,  11:39 AM

## 2022-01-05 NOTE — Progress Notes (Signed)
PROGRESS NOTE   Subjective/Complaints: Patient seen in bed, no overnight complaints or concerns.  ROS: Bo SOB, CP, nausea, vomiting, or pain.    Objective:   No results found. Recent Labs    01/03/22 0538  WBC 5.9  HGB 7.6*  HCT 23.4*  PLT 170   Recent Labs    01/03/22 0538 01/04/22 0635  NA 148* 150*  K 3.5 3.5  CL 122* 121*  CO2 20* 23  GLUCOSE 110* 109*  BUN 29* 29*  CREATININE 1.48* 1.30*  CALCIUM 8.4* 8.7*    Intake/Output Summary (Last 24 hours) at 01/05/2022 1327 Last data filed at 01/05/2022 1200 Gross per 24 hour  Intake 635 ml  Output --  Net 635 ml        Physical Exam: Vital Signs Blood pressure (!) 131/55, pulse (!) 56, temperature 98.4 F (36.9 C), temperature source Oral, resp. rate 16, height 5\' 1"  (1.549 m), weight 61.3 kg, SpO2 98 %.   General: Alert and oriented x 3, No apparent distress HEENT: Head is normocephalic, atraumatic, PERRLA, EOMI, sclera anicteric, oral mucosa pink and moist.  Neck: Supple without JVD or lymphadenopathy Heart: Reg rate and rhythm. No murmurs, rubs, or gallops Chest: CTA bilaterally without wheezes, rales, or rhonchi Abdomen: Soft, non-tender, non-distended, bowel sounds positive. Extremities: No clubbing, cyanosis, or edema. Pulses are 2+ Psych: [name] affect is appropriate. Pt is cooperative Skin: Clean and intact without signs of breakdown Neuro: Right gaze preference, lt facial droop present, pt makes good eye contact but has dysarthria.  Can follow simple commands. Moderate dysarthria, L inattention. Decreased to light touch LUE/LLE and left face. Musculoskeletal: 5/5 RUE/RLE, 0/5 LUE/LLE  Assessment/Plan: 1. Functional deficits which require 3+ hours per day of interdisciplinary therapy in a comprehensive inpatient rehab setting. Physiatrist is providing close team supervision and 24 hour management of active medical problems listed  below. Physiatrist and rehab team continue to assess barriers to discharge/monitor patient progress toward functional and medical goals  Care Tool:  Bathing    Body parts bathed by patient: Face, Left arm, Right upper leg   Body parts bathed by helper: Right arm, Chest, Abdomen, Left upper leg, Right lower leg, Left lower leg     Bathing assist Assist Level: Total Assistance - Patient < 25%     Upper Body Dressing/Undressing Upper body dressing   What is the patient wearing?: Hospital gown only    Upper body assist Assist Level: Total Assistance - Patient < 25%    Lower Body Dressing/Undressing Lower body dressing      What is the patient wearing?: Incontinence brief     Lower body assist Assist for lower body dressing: 2 Helpers     Toileting Toileting    Toileting assist Assist for toileting: 2 Helpers     Transfers Chair/bed transfer  Transfers assist  Chair/bed transfer activity did not occur: Safety/medical concerns  Chair/bed transfer assist level: 2 Helpers     Locomotion Ambulation   Ambulation assist   Ambulation activity did not occur: Safety/medical concerns          Walk 10 feet activity   Assist  Walk 10 feet activity did  not occur: Safety/medical concerns        Walk 50 feet activity   Assist Walk 50 feet with 2 turns activity did not occur: Safety/medical concerns         Walk 150 feet activity   Assist Walk 150 feet activity did not occur: Safety/medical concerns         Walk 10 feet on uneven surface  activity   Assist Walk 10 feet on uneven surfaces activity did not occur: Safety/medical concerns         Wheelchair     Assist Is the patient using a wheelchair?: Yes Type of Wheelchair: Manual Wheelchair activity did not occur: Safety/medical concerns         Wheelchair 50 feet with 2 turns activity    Assist    Wheelchair 50 feet with 2 turns activity did not occur: Safety/medical  concerns       Wheelchair 150 feet activity     Assist  Wheelchair 150 feet activity did not occur: Safety/medical concerns       Blood pressure (!) 131/55, pulse (!) 56, temperature 98.4 F (36.9 C), temperature source Oral, resp. rate 16, height 5\' 1"  (1.549 m), weight 61.3 kg, SpO2 98 %.  Medical Problem List and Plan: 1. 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  Antithrombotics: -DVT/anticoagulation:  Pharmaceutical: Lovenox             -antiplatelet therapy: Aspirin 81 mg daily and Brilinta 90 mg twice daily 6/3 Continue above regimen 3. Pain Management: Hydrocodone as needed 6/3 No issues with pain  today 4. Mood: Provide emotional support             -antipsychotic agents: N/A 5. Neuropsych: This patient is capable of making decisions on his own behalf. 6. Skin/Wound Care: Routine skin checks 7. Fluids/Electrolytes/Nutrition: Routine in and outs with follow-up chemistry 8.  Dysphagia.  Dysphagia #1 nectar thick liquid.  Monitor hydration.  Follow-up speech therapy 9.  Hypertension.  Norvasc 5 mg daily, Tenormin 100 mg daily.  Monitor with increased mobility -Continue to monitor, permissive HTN 6/3 SBP ranging from 129-150 today, continue Norvasc 5 mg daily, 100 mg Tenormin daily. 6/4 SBP ranging 131-154 today, continue to monitor with permissive HTN    01/05/2022   11:14 AM 01/05/2022    3:22 AM 01/04/2022    7:51 PM  Vitals with BMI  Systolic 254 270 623  Diastolic 55 69 77  Pulse 56 65 72    10.  Hyperlipidemia.  Crestor 11.  BPH.  Flomax 0.4 mg daily.  Check PVR 12.  CKD stage III.   -Cr overall stable at 1.48, continue to monitor 6/4 serum crt qMonday 13.  Constipation- will give Sorbitol if no BM -Large BM this AM 6/2 6/4 LBM today 14, Dispo- family member can stay overnight 15. Hypernatremia mild, encourage fluid intake, repeat labs ordered 16. Hypoalbuminemia, ate 100 percent breakfast, continue to monitor 6/4 Continues to eat well, CTM    LOS: 3 days A FACE TO FACE  EVALUATION WAS PERFORMED  Luetta Nutting 01/05/2022, 1:27 PM

## 2022-01-05 NOTE — IPOC Note (Signed)
Overall Plan of Care [facility]) Patient Details Name: Chad [name] MRN: [id number] DOB: May 20, 1932  Admitting Diagnosis: Right middle cerebral artery stroke [facility])  Hospital Problems: Principal Problem:   Right middle cerebral artery stroke ([place])     Functional Problem List: Nursing Endurance, Medication Management, Bladder, Safety  PT Balance, Behavior, Edema, Endurance, Motor, Nutrition, Pain, Perception, Safety, Sensory  OT Balance, Behavior, Cognition, Endurance, Motor, Safety, Perception, Sensory, Vision  SLP Cognition, Motor, Nutrition, Safety  TR         Basic ADL's: OT Eating, Grooming, Bathing, Dressing, Toileting     Advanced  ADL's: OT       Transfers: PT Bed Mobility, Bed to Chair, Car, Manufacturing systems engineer, Metallurgist: PT Ambulation, Emergency planning/management officer     Additional Impairments: OT Fuctional Use of Upper Extremity  SLP Swallowing, Social Cognition, Communication expression Problem Solving, Memory, Attention, Awareness  TR      Anticipated Outcomes Item Anticipated Outcome  Self Feeding S  Swallowing  Sup A   Basic self-care  mod A  Toileting  mod A   Bathroom Transfers mod A  Bowel/Bladder  manage bladder w mod I assist  Transfers  MinA  Locomotion  Min/ ModA  Communication  Sup A  Cognition  Min A  Pain  N/A  Safety/Judgment  Maintain safety w cues   Therapy Plan: PT Intensity: Minimum of 1-2 x/day ,45 to 90 minutes PT Frequency: 5 out of 7 days PT Duration Estimated Length of Stay: 4 weeks OT Intensity: Minimum of 1-2 x/day, 45 to 90 minutes OT Frequency: 5 out of 7 days OT Duration/Estimated Length of Stay: 4-4.5 weeks SLP Intensity: Minumum of 1-2 x/day, 30 to 90 minutes SLP Frequency: 3 to 5 out of 7 days SLP Duration/Estimated Length of Stay: 3-3.5 weeks   Team Interventions: Nursing Interventions Disease Management/Prevention, Medication Management, Discharge Planning, Patient/Family Education,  Dysphagia/Aspiration Precaution Training, Bladder Management  PT interventions Ambulation/gait training, Training and development officer, Cognitive remediation/compensation, Community reintegration, Discharge planning, Disease management/prevention, Functional mobility training, Patient/family education, Splinting/orthotics, Therapeutic Exercise, Visual/perceptual remediation/compensation, UE/LE Coordination activities, Therapeutic Activities, Skin care/wound management, Pain management, Functional electrical stimulation, DME/adaptive equipment instruction, Neuromuscular re-education, Psychosocial support, Stair training, UE/LE Strength taining/ROM, Wheelchair propulsion/positioning  OT Interventions Training and development officer, Disease mangement/prevention, Neuromuscular re-education, Self Care/advanced ADL retraining, Therapeutic Exercise, Wheelchair propulsion/positioning, Pain management, DME/adaptive equipment instruction, Cognitive remediation/compensation, Community reintegration, Functional electrical stimulation, Patient/family education, Splinting/orthotics, UE/LE Coordination activities, UE/LE Strength taining/ROM, Visual/perceptual remediation/compensation, Functional mobility training, Discharge planning, Psychosocial support, Therapeutic Activities  SLP Interventions Cognitive remediation/compensation, Environmental controls, Internal/external aids, Therapeutic Exercise, Therapeutic Activities, Patient/family education, Functional tasks, Dysphagia/aspiration precaution training, Cueing hierarchy  TR Interventions    SW/CM Interventions Discharge Planning, Psychosocial Support, Patient/Family Education   Barriers to Discharge MD  Medical stability  Nursing Decreased caregiver support, Home environment access/layout, Neurogenic Bowel & Bladder 1 level 3ste right rail solo; daughter can assist  PT Inaccessible home environment, Decreased caregiver support, Incontinence, Insurance for SNF coverage,  Nutrition means    OT Decreased caregiver support, Home environment access/layout, Incontinence    SLP Decreased caregiver support    SW Insurance for SNF coverage     Team Discharge Planning: Destination: PT-Home ,OT- Home , SLP-Home Projected Follow-up: PT-24 hour supervision/assistance, Home health PT, OT-  Home health OT, Outpatient OT, 24 hour supervision/assistance, SLP-24 hour supervision/assistance, Home Health SLP, Outpatient SLP Projected Equipment Needs: PT-To be determined, OT- To be determined, SLP-To be determined Equipment Details: PT- , OT-  Patient/family involved in discharge planning: PT- Patient, Family member/caregiver,  OT-Patient, Family member/caregiver, SLP-Patient, Family member/caregiver  MD ELOS: 21-24 days Medical Rehab Prognosis:  Excellent Assessment: The patient has been admitted for CIR therapies with the diagnosis of right MCA infarct. The team will be addressing functional mobility, strength, stamina, balance, safety, adaptive techniques and equipment, self-care, bowel and bladder mgt, patient and caregiver education. Goals have been set at min to [place]. Anticipated discharge destination is home.        See Team Conference Notes for weekly updates to the plan of care

## 2022-01-06 MED ORDER — SODIUM CHLORIDE 0.45 % IV BOLUS
500.0000 mL | Freq: Once | INTRAVENOUS | Status: AC
  Administered 2022-01-06: 500 mL via INTRAVENOUS

## 2022-01-06 NOTE — Progress Notes (Addendum)
Occupational Therapy Session Note  Patient Details  Name: Chad [name] MRN: [id number] Date of Birth: April 09, 1932  Today's Date: 01/06/2022 OT Individual Time: 1102-1200 OT Individual Time Calculation (min): 58 min    Short Term Goals: Week 1:  OT Short Term Goal 1 (Week 1): Pt will maintain static sitting balance EOB with CGA > 5 min. OT Short Term Goal 2 (Week 1): Pt will don shirt with mod A. OT Short Term Goal 3 (Week 1): Pt will complete >2 grooming tasks seated at sink with S. OT Short Term Goal 4 (Week 1): Pt will complete toilet transfer with maxA and LRAD.  Skilled Therapeutic Interventions/Progress Updates:     Noted bedside orders placed after chart was reviewed in the morning. Nursing was present upon OT arrival taking vitals as noted in nursing note. Nursing did not indicate pt couldn't get OOB, but orders may not have been seen yet as well.  OT assisted with standing BP with rehab techn providing +2 assist. Max +2 in stedy to stand  BP 163/88, HR 73. Nursing did not indicate pt couldn't get OOB. Stedy transfer to [place] with +2. Addressed sitting balance and midline orientation using mirror feedback during BADL tasks. OT provided total hand over hand A to integrate L UE into bathing tasks with gentle ROM to decrease flexor tone. Max cues to attend to L side of body. Sit<>stand at the sink with pt able to initiate power up, but still needed max A +2. While standing, OT facilitated weight bearing through L UE for neuro re-ed, while rehab tech pulled pants up over hips. Pt brought to therapy gym where OT provided gentle stretching, ROM, and massage to L UE including tight L pec to improve ROM and decrease flexor tone. Pt requested to stay up in the wc and was left with L UE supported, wc tilted, alarm belt on, and call bell in reach.   Therapy Documentation Precautions:  Precautions Precautions: Fall Precaution Comments: dense L hemi/neglect Restrictions Weight Bearing  Restrictions: Yes Pain: Pain Assessment Pain Scale: 0-10 Pain Score: 0-No pain   Therapy/Group: Individual Therapy  Valma Cava 01/06/2022, 12:24 PM

## 2022-01-06 NOTE — Progress Notes (Signed)
   01/06/22 1104  Orthostatic Lying   BP- Lying 141/67  Pulse- Lying 66  Orthostatic Sitting  BP- Sitting 154/72  Pulse- Sitting 64  Orthostatic Standing at 0 minutes  BP- Standing at 0 minutes 163/88  Pulse- Standing at 0 minutes 73

## 2022-01-06 NOTE — Progress Notes (Signed)
   01/06/22 0925  Clinical Encounter Type  Visited With Patient  Visit Type Initial;Spiritual support  Referral From Nurse  Consult/Referral To Chaplain   Chaplain responded to a spiritual consult for advanced directive education. The patient did not recall requesting information but asked if I would leave the paperwork and he would show it to his daughter.   Danice Goltz [facility]  [phone]

## 2022-01-06 NOTE — Progress Notes (Signed)
PROGRESS NOTE   Subjective/Complaints:   Felt dizzy in therapy , heart beating fast , BP was in 90s in therapy , brought back top room, fluid intake fair yesterday   ROS: No SOB, CP, nausea, vomiting, or pain.    Objective:   No results found. No results for input(s): WBC, HGB, HCT, PLT in the last 72 hours.  Recent Labs    01/04/22 0635  NA 150*  K 3.5  CL 121*  CO2 23  GLUCOSE 109*  BUN 29*  CREATININE 1.30*  CALCIUM 8.7*     Intake/Output Summary (Last 24 hours) at 01/06/2022 0855 Last data filed at 01/06/2022 0100 Gross per 24 hour  Intake 825 ml  Output --  Net 825 ml         Physical Exam: Vital Signs Blood pressure (!) 153/65, pulse 63, temperature 98.1 F (36.7 C), temperature source Oral, resp. rate 18, height 5\' 1"  (1.549 m), weight 61.3 kg, SpO2 98 %.   General: No acute distress Mood and affect are appropriate Heart: Regular rate and rhythm no rubs murmurs or extra sounds Lungs: Clear to auscultation, breathing unlabored, no rales or wheezes Abdomen: Positive bowel sounds, soft nontender to palpation, nondistended Extremities: No clubbing, cyanosis, or edema Skin: No evidence of breakdown, no evidence of rash Neurologic: Cranial nerves II through XII intact, motor strength is 5/5 in bilateral deltoid, bicep, tricep, grip, hip flexor, knee extensors, ankle dorsiflexor and plantar flexor  Cerebellar moderate dysmetria Left finger nose finger  Skin: Clean and intact without signs of breakdown Neuro: Right gaze preference, lt facial droop present, pt makes good eye contact but has dysarthria.  Can follow simple commands. Moderate dysarthria, L inattention.   Assessment/Plan: 1. Functional deficits which require 3+ hours per day of interdisciplinary therapy in a comprehensive inpatient rehab setting. Physiatrist is providing close team supervision and 24 hour management of active medical problems  listed below. Physiatrist and rehab team continue to assess barriers to discharge/monitor patient progress toward functional and medical goals  Care Tool:  Bathing    Body parts bathed by patient: Face, Left arm, Right upper leg   Body parts bathed by helper: Right arm, Chest, Abdomen, Left upper leg, Right lower leg, Left lower leg     Bathing assist Assist Level: Total Assistance - Patient < 25%     Upper Body Dressing/Undressing Upper body dressing   What is the patient wearing?: Hospital gown only    Upper body assist Assist Level: Total Assistance - Patient < 25%    Lower Body Dressing/Undressing Lower body dressing      What is the patient wearing?: Incontinence brief     Lower body assist Assist for lower body dressing: 2 Helpers     Toileting Toileting    Toileting assist Assist for toileting: 2 Helpers     Transfers Chair/bed transfer  Transfers assist  Chair/bed transfer activity did not occur: Safety/medical concerns  Chair/bed transfer assist level: 2 Helpers     Locomotion Ambulation   Ambulation assist   Ambulation activity did not occur: Safety/medical concerns          Walk 10 feet activity  Assist  Walk 10 feet activity did not occur: Safety/medical concerns        Walk 50 feet activity   Assist Walk 50 feet with 2 turns activity did not occur: Safety/medical concerns         Walk 150 feet activity   Assist Walk 150 feet activity did not occur: Safety/medical concerns         Walk 10 feet on uneven surface  activity   Assist Walk 10 feet on uneven surfaces activity did not occur: Safety/medical concerns         Wheelchair     Assist Is the patient using a wheelchair?: Yes Type of Wheelchair: Manual Wheelchair activity did not occur: Safety/medical concerns         Wheelchair 50 feet with 2 turns activity    Assist    Wheelchair 50 feet with 2 turns activity did not occur:  Safety/medical concerns       Wheelchair 150 feet activity     Assist  Wheelchair 150 feet activity did not occur: Safety/medical concerns       Blood pressure (!) 153/65, pulse 63, temperature 98.1 F (36.7 C), temperature source Oral, resp. rate 18, height 5\' 1"  (1.549 m), weight 61.3 kg, SpO2 98 %.  Medical Problem List and Plan: 1. 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  Antithrombotics: -DVT/anticoagulation:  Pharmaceutical: Lovenox             -antiplatelet therapy: Aspirin 81 mg daily and Brilinta 90 mg twice daily 6/3 Continue above regimen 3. Pain Management: Hydrocodone as needed 6/3 No issues with pain  today 4. Mood: Provide emotional support             -antipsychotic agents: N/A 5. Neuropsych: This patient is capable of making decisions on his own behalf. 6. Skin/Wound Care: Routine skin checks 7. Fluids/Electrolytes/Nutrition: Routine in and outs with follow-up chemistry 8.  Dysphagia.  Dysphagia #1 nectar thick liquid.  Monitor hydration.  Follow-up speech therapy 9.  Hypertension.  Norvasc 5 mg daily, Tenormin 100 mg daily.  Monitor with increased mobility -Continue to monitor, permissive HTN Low SBP with tachy  Norvasc 5 mg daily, 100 mg Tenormin daily.Also on flomax      01/06/2022    3:27 AM 01/05/2022    7:36 PM  Vitals with BMI  Systolic 128 786  Diastolic 65 66  Pulse 63 59   May need to switch BB to Coreg for improved rate control  10.  Hyperlipidemia.  Crestor 11.  BPH.  Flomax 0.4 mg daily.  hold for hypotension  12.  CKD stage III.      Latest Ref Rng & Units 01/04/2022    6:35 AM 01/03/2022    5:38 AM  01/02/2022    5:48 AM  BMP  Glucose 70 - 99 mg/dL 109   110   123    BUN 8 - 23 mg/dL 29   29   25  Creatinine 0.61 - 1.24 mg/dL 1.30   1.48   1.45    Sodium 135 - 145 mmol/L 150   148   147    Potassium 3.5 - 5.1 mmol/L 3.5   3.5   4.1    Chloride 98 - 111 mmol/L 121   122   123    CO2 22 - 32 mmol/L 23   20   17     Calcium 8.9 - 10.3 mg/dL 8.7   8.4   8.1    Na+ , Cl- elevated , some fluid contraction , will give .45 NaCL bolus   13. Constipation- will give Sorbitol if no BM -Large BM this AM 6/2 6/4 LBM today 14, Dispo- family member can stay overnight 15. Hypernatremia mild, encourage fluid intake, repeat labs ordered 16. Hypoalbuminemia, ate 100 percent breakfast, continue to monitor 6/4 Continues to eat well, CTM  17.  Tachycardia, may be reflexive  sounds regular but will check EKG 12 lead , may need to switch tenormin to coreg   LOS: 4 days A FACE TO FACE EVALUATION WAS PERFORMED  Charlett Blake 01/06/2022, 8:55 AM

## 2022-01-06 NOTE — Progress Notes (Signed)
Occupational Therapy Session Note  Patient Details  Name: Chad [name] MRN: [id number] Date of Birth: 1932-06-05  Today's Date: 01/06/2022 OT Individual Time: 1305-1400 OT Individual Time Calculation (min): 55 min    Short Term Goals: Week 1:  OT Short Term Goal 1 (Week 1): Pt will maintain static sitting balance EOB with CGA > 5 min. OT Short Term Goal 2 (Week 1): Pt will don shirt with mod A. OT Short Term Goal 3 (Week 1): Pt will complete >2 grooming tasks seated at sink with S. OT Short Term Goal 4 (Week 1): Pt will complete toilet transfer with maxA and LRAD.  Skilled Therapeutic Interventions/Progress Updates:    MD orders for bedside therapy only 01/06/22 and normal OT therapy can resume 01/07/22.  Pt received in TIS wc (he had been transferred to w/c prior to therapy being aware of orders).  NT assisting pt with feeding, OT encouraged her to have pt self feed. Pt able to use R hand to scoop food.   With +2 A from rehab tech, pt stood in stedy lift with max A as he was leaning heavily to his L due to pushing off of R.  Tried to get pt to realign to midline but unable to facilitate well.  Transferred to EOB and moved to supine with max A of 2.    Pt positioned in bed and then focus of treatment on LUE PROM.  He has increased tone in shoulder pulling his humeral head into anterior subluxation, limiting safe sh flex range. He also has mod hypertone in elbow flexors. Spent quite a bit of time working on elbow extension.  He has severe crepitis in L wrist and wrist mobility does not seem stable. Recommend use of wrist support splint. In the meantime, I wrapped ACE wrap around his wrist for support.   Pt very verbal, talking a great deal.  At end of session, pt resting in bed with L arm supported on pillows. Bed alarm set and all needs met.    Therapy Documentation Precautions:  Precautions Precautions: Fall Precaution Comments: dense L hemi/neglect Restrictions Weight Bearing  Restrictions: Yes    Vital Signs: Therapy Vitals Temp: 99.5 F (37.5 C) Temp Source: Oral Pulse Rate: 66 Resp: 18 BP: (!) 141/67 Patient Position (if appropriate): Lying Oxygen Therapy SpO2: 99 % O2 Device: Room Air Pain: Pain Assessment Pain Score: 0-No pain ADL: ADL Eating: Maximal assistance Where Assessed-Eating: Wheelchair Grooming: Maximal assistance Where Assessed-Grooming: Sitting at sink Upper Body Bathing: Maximal assistance Where Assessed-Upper Body Bathing: Sitting at sink Lower Body Bathing: Maximal assistance Where Assessed-Lower Body Bathing: Sitting at sink Upper Body Dressing: Maximal assistance Where Assessed-Upper Body Dressing: Edge of bed Lower Body Dressing: Maximal assistance Where Assessed-Lower Body Dressing: Standing at sink Toileting: Maximal assistance Where Assessed-Toileting: Bed level Toilet Transfer: Unable to assess Tub/Shower Transfer: Not assessed Social research officer, government: Not assessed     Therapy/Group: Individual Therapy  [name] 01/06/2022, 12:57 PM

## 2022-01-06 NOTE — Progress Notes (Signed)
RN at bedside to assess PT at shift change 1930. Pt c/o of dry mouth, and  hunger. Pt mouth is dry with white patches. Pt had 4 episodes of diarrhea yesterday and has increased thirst today. RN was able to push Nectar thick fluids throughout the night and will continue. Pt has had a total of 354 ml PO. Fluids were tolerated with some mild coughing that was cleared by coughing.  Suction at bedside. RN performed suction with white productive cough. Pt also was able to intake about 118 ml of nutrition shake.   23:00 Pt c/o of decreased sleep and some mild generalized pain. Pt given PRN Norco. Pain decreased from 4/10 to 0. Pt repositioned, brief changed, and more PO fluids given. Pt states he feels so much better. Pillows placed on [name] right side for his deficit. Resp even and unlabored. No acute distress noted. Pt has ahx of insomnia. Education provided about sleep and rehab. Pt verbalizes understanding.

## 2022-01-06 NOTE — Progress Notes (Signed)
Physical Therapy Session Note  Patient Details  Name: Chad [name] MRN: [id number] Date of Birth: January 17, 1932  Today's Date: 01/06/2022 PT Individual Time: 1035-1100 PT Individual Time Calculation (min): 25 min   Short Term Goals: Week 1:  PT Short Term Goal 1 (Week 1): Pt will orient to midline in sitting with 50% of attempts. PT Short Term Goal 2 (Week 1): Pt will perform bed mobility with with consistent Max A. PT Short Term Goal 3 (Week 1): Pt will perform sit<>stand and find upright posture with MaxA +2. PT Short Term Goal 4 (Week 1): Pt will initiate gait training.  Skilled Therapeutic Interventions/Progress Updates:    Pt received seated in bed, agreeable to PT session. No complaints of pain. Unable to locate w/c in pt's room, obtained TIS chair for patient to use during rehab stay. Noted bedside orders placed after chart was reviewed in the morning so attempted to assist pt OOB to chair this AM. Supine to sit with max A for LE management and trunk control. Pt requires min A for sitting balance with attempts to push to the L, cues to keep R hand on bed or in lap rather than reaching for bedrail. Sit to stand with max A to stedy, unable to safely fully stand upright or transfer with stedy with just assist x 1 and no +2 available this session. Pt with significant L neglect with head flexed and rotated to R in sitting and standing. Pt returned to supine with max A for BLE management. Pt able to assist with scooting towards [facility] with bed in reverse Trendelenberg position and use of RUE on bedrail. Pt left seated in bed with needs in reach in care of NT for vitals assessment.  Therapy Documentation Precautions:  Precautions Precautions: Fall Precaution Comments: dense L hemi/neglect Restrictions Weight Bearing Restrictions: Yes      Therapy/Group: Individual Therapy   Excell Seltzer, PT, DPT, CSRS 01/06/2022, 12:43 PM

## 2022-01-06 NOTE — Progress Notes (Signed)
Orthopedic Tech Progress Note Patient Details:  Chad [name] 1931/12/18 [id number]  Ortho Devices Type of Ortho Device: Wrist splint Ortho Device/Splint Location: LUE Ortho Device/Splint Interventions: Ordered, Application, Adjustment   Post Interventions Patient Tolerated: Well Instructions Provided: Care of device  Janit Pagan 01/06/2022, 2:52 PM

## 2022-01-06 NOTE — Progress Notes (Signed)
Speech Language Pathology Daily Session Note  Patient Details  Name: DELYNN PURSLEY MRN: [id number] Date of Birth: [birth year]  Today's Date: 01/06/2022 SLP Individual Time: 0930-1015 SLP Individual Time Calculation (min): 45 min  Short Term Goals: Week 1: SLP Short Term Goal 1 (Week 1): Pt will tolerate Dys 1/NTL diet with minimal s/s aspiration provided mod A cues for swallow precautions SLP Short Term Goal 2 (Week 1): Pt will tolerate trials of Dys 2 and thin liquids with SLP only with minimal s/s aspiration to determine appropriateness for follow up MBS or diet upgrade SLP Short Term Goal 3 (Week 1): Pt will participate in further cognitive testing to determine current impairments and guide POC SLP Short Term Goal 4 (Week 1): Pt will increase sustained attention during functional tasks to 5-7 minutes provided mod A cues SLP Short Term Goal 5 (Week 1): Pt will solve basic problems during functional tasks provided mod A cues SLP Short Term Goal 6 (Week 1): Pt will utilize speech intelligibility strategies as trained provided max A cues to achieve 70% intelligibility at phrase level  Skilled Therapeutic Interventions:Skilled ST services focused on swallow and cognitive skills. SLP entered the room as NT was feeding pt nectar thick liquids via straw, pt demonstrated immediate coughing and wet vocal quality. SLP provided education for small sips and for pt to increase awareness of wet vocal quality. Pt was able to produce clear vocal quality with verbal cue to cough and then re-swallow. SLP facilitated PO intake of pureed textures and nectar thick liquids via straw. Pt demonstrated dry oral cavity, impacting oral clearance of pureed textures leaving a mild residue across oral cavity. SLP provided liquid wash with nectar thick liquids every 2-3 bites increasing oral clearance of residue. Pt required controled sips to ensure single sips (max A verbal cues) however when pt consumed two sips in a row no  overt s/s aspiration noted. Pt demonstrated sustained attention up to 5 minutes and required  total A for feeding. Staff entered to preform EKG, pt missed 5 minutes of treatment. SLP completed oral care via suction toothbrush. Pt was left in room with call bell within reach and bed alarm set. SLP recommends to continue skilled services.      Pain Pain Assessment Pain Score: 0-No pain  Therapy/Group: Individual Therapy  Monte Zinni  [facility] 01/06/2022, 1:00 PM

## 2022-01-07 NOTE — Progress Notes (Signed)
Physical Therapy Session Note  Patient Details  Name: Chad [name] MRN: [id number] Date of Birth: 06-16-1932  Today's Date: 01/07/2022 PT Individual Time: 2878-6767 PT Individual Time Calculation (min): 72 min   Short Term Goals: Week 1:  PT Short Term Goal 1 (Week 1): Pt will orient to midline in sitting with 50% of attempts. PT Short Term Goal 2 (Week 1): Pt will perform bed mobility with with consistent Max A. PT Short Term Goal 3 (Week 1): Pt will perform sit<>stand and find upright posture with MaxA +2. PT Short Term Goal 4 (Week 1): Pt will initiate gait training.  Skilled Therapeutic Interventions/Progress Updates:    Pt received asleep, sitting in TIS w/c but easily awakens and agreeable to therapy session. Assessed vitals: BP 138/59 (MAP 81), HR 63bpm    Transported to/from gym in w/c for time management and energy conservation.  Pt continues to demo L inattention with consistent R cervical rotation and gaze preference.  R squat pivot transfer w/c>EOM with max assist of 1 for lifting/pivoting hips and preventing L trunk LOB while blocking L knee to promote WBing.  Sit>stand EOM>R HHA with L UE support around therapist's shoulders with mod assist for lifting to stand and guarding/blocking L knee - requires cuing for upright posture but with increased time able to achieve - no significant pushing noticed while standing during session.  Donned L LE ankle DF assist ACE wrap.  Sitting balance EOM throughout session with close supervision/CGA and frequent verbal/visual cuing to lean towards R due to gradual L drifting.  Gait training 84ft + 42ft x4 with L UE support around therapist's shoulders and R HHA with +2 mod assist for balance and L LE management  - requires total assist to advance L LE during swing with pt demonstrating slight initiation of swing - total progressed to heavy max assist to block L knee during stance while facilitation increased hip extension and abductor  activation due to significant hip drop during L stance phase - cuing to maintain upright posture and look ahead - during 3rd walk demonstrates worsening L lean with more flexed trunk but improved again on 4th walk - requires heavier assist and manual facilitation for sequencing LE stepping when turning to sit  Assessed vitals after 1st walk: BP 159/64 (MAP 92), HR 65bpm, SpO2 98% No complaints of symptoms throughout session.  Transported back to room and pt reporting need to use bathroom.   Sit>stand w/c>stedy with heavy mod to max assist for standing and balance with pt demonstrating worsening L trunk lean and pushing in stedy compared to when using B HHA in gym. Stedy transport in/out bathroom with +2 for assist to maintain trunk control. Pt left seated on BSC over toilet in the care of NT.   Therapy Documentation Precautions:  Precautions Precautions: Fall Precaution Comments: dense L hemi/neglect Restrictions Weight Bearing Restrictions: Yes  Pain: Denies pain during session.   Therapy/Group: Individual Therapy  Tawana Scale , PT, DPT, NCS, CSRS 01/07/2022, 12:21 PM

## 2022-01-07 NOTE — Progress Notes (Signed)
PROGRESS NOTE   Subjective/Complaints:   Working with SLP today , c/o left sided weakness since CVA   ROS: No SOB, CP, nausea, vomiting, or pain.    Objective:   No results found. No results for input(s): WBC, HGB, HCT, PLT in the last 72 hours.  No results for input(s): NA, K, CL, CO2, GLUCOSE, BUN, CREATININE, CALCIUM in the last 72 hours.   Intake/Output Summary (Last 24 hours) at 01/07/2022 0901 Last data filed at 01/06/2022 1904 Gross per 24 hour  Intake 1009.39 ml  Output --  Net 1009.39 ml         Physical Exam: Vital Signs Blood pressure 136/74, pulse 65, temperature 98.6 F (37 C), resp. rate 18, height 5\' 1"  (1.549 m), weight 54.7 kg, SpO2 100 %.   General: No acute distress Mood and affect are appropriate Heart: Regular rate and rhythm no rubs murmurs or extra sounds Lungs: Clear to auscultation, breathing unlabored, no rales or wheezes Abdomen: Positive bowel sounds, soft nontender to palpation, nondistended Extremities: No clubbing, cyanosis, or edema Skin: No evidence of breakdown, no evidence of rash Neurologic: Cranial nerves II through XII intact, motor strength is 5/5 in RIgh t and 0/5 left deltoid, bicep, tricep, grip, 5/5 RIght and 3- hip flexor, knee extensors, 5/5 RIght and 0/5 left ankle dorsiflexor and plantar flexor  Cerebellar moderate dysmetria Left finger nose finger  Skin: Clean and intact without signs of breakdown Neuro: +dysarthria.  Can follow simple commands.  L inattention.   Assessment/Plan: 1. Functional deficits which require 3+ hours per day of interdisciplinary therapy in a comprehensive inpatient rehab setting. Physiatrist is providing close team supervision and 24 hour management of active medical problems listed below. Physiatrist and rehab team continue to assess barriers to discharge/monitor patient progress toward functional and medical goals  Care  Tool:  Bathing    Body parts bathed by patient: Face, Left arm, Right upper leg   Body parts bathed by helper: Right arm, Chest, Abdomen, Left upper leg, Right lower leg, Left lower leg     Bathing assist Assist Level: Total Assistance - Patient < 25%     Upper Body Dressing/Undressing Upper body dressing   What is the patient wearing?: Hospital gown only    Upper body assist Assist Level: Total Assistance - Patient < 25%    Lower Body Dressing/Undressing Lower body dressing      What is the patient wearing?: Incontinence brief     Lower body assist Assist for lower body dressing: 2 Helpers     Toileting Toileting    Toileting assist Assist for toileting: 2 Helpers     Transfers Chair/bed transfer  Transfers assist  Chair/bed transfer activity did not occur: Safety/medical concerns  Chair/bed transfer assist level: 2 Helpers     Locomotion Ambulation   Ambulation assist   Ambulation activity did not occur: Safety/medical concerns          Walk 10 feet activity   Assist  Walk 10 feet activity did not occur: Safety/medical concerns        Walk 50 feet activity   Assist Walk 50 feet with 2 turns activity did not occur:  Safety/medical concerns         Walk 150 feet activity   Assist Walk 150 feet activity did not occur: Safety/medical concerns         Walk 10 feet on uneven surface  activity   Assist Walk 10 feet on uneven surfaces activity did not occur: Safety/medical concerns         Wheelchair     Assist Is the patient using a wheelchair?: Yes Type of Wheelchair: Manual Wheelchair activity did not occur: Safety/medical concerns         Wheelchair 50 feet with 2 turns activity    Assist    Wheelchair 50 feet with 2 turns activity did not occur: Safety/medical concerns       Wheelchair 150 feet activity     Assist  Wheelchair 150 feet activity did not occur: Safety/medical concerns       Blood  pressure 136/74, pulse 65, temperature 98.6 F (37 C), resp. rate 18, height 5\' 1"  (1.549 m), weight 54.7 kg, SpO2 100 %.  Medical Problem List and Plan: 1. 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  Antithrombotics: -DVT/anticoagulation:  Pharmaceutical: Lovenox             -antiplatelet therapy: Aspirin 81 mg daily and Brilinta 90 mg twice daily 6/3 Continue above regimen 3. Pain Management: Hydrocodone as needed 6/3 No issues with pain  today 4. Mood: Provide emotional support             -antipsychotic agents: N/A 5. Neuropsych: This patient is capable of making decisions on his own behalf. 6. Skin/Wound Care: Routine skin checks 7. Fluids/Electrolytes/Nutrition: Routine in and outs with follow-up chemistry 8.  Dysphagia.  Dysphagia #1 nectar thick liquid.  Monitor hydration.  Follow-up speech therapy 9.  Hypertension.  Norvasc 5 mg daily, Tenormin 100 mg daily.  Monitor with increased mobility -Continue to monitor, permissive HTN Low SBP with tachy  Norvasc 5 mg daily, 100 mg Tenormin daily.Also on flomax      01/07/2022    4:35 AM 01/06/2022    8:09 PM 01/06/2022   11:04 AM  Vitals with BMI  Weight 120 lbs 9 oz    BMI 41.9    Systolic 379 024 097  Diastolic 74 61 67  Pulse 65 63 66  BP and HR controlled 01/07/22 10.  Hyperlipidemia.  Crestor 11.  BPH.  Flomax 0.4 mg daily.  hold for hypotension  + orthostatic BP drop but no compensatory tachy, likely BB effect  12.  CKD stage III.      Latest Ref Rng & Units 01/04/2022    6:35 AM 01/03/2022    5:38 AM 01/02/2022    5:48 AM  BMP  Glucose 70 - 99 mg/dL 109   110   123    BUN 8 -  23 mg/dL 29   29   25     Creatinine 0.61 - 1.24 mg/dL 1.30  1.48   1.45    Sodium 135 - 145 mmol/L 150   148   147    Potassium 3.5 - 5.1 mmol/L 3.5   3.5   4.1    Chloride 98 - 111 mmol/L 121   122   123    CO2 22 - 32 mmol/L 23   20   17     Calcium 8.9 - 10.3 mg/dL 8.7   8.4   8.1    Na+ , Cl- elevated , some fluid contraction , will give .45 NaCL bolus   13. Constipation- will give Sorbitol if no BM  6/6 LBM today 14, Dispo- family member can stay overnight 15. Hypernatremia mild, encourage fluid intake, repeat labs ordered 16. Hypoalbuminemia, ate 100 percent breakfast, continue to monitor 6/4 Continues to eat well, CTM  17.  Tachycardia,but mildly bradycardic on EKG, rate 61, NSR, tracing without appreciable change since 12/30/21  LOS: 5 days A FACE TO FACE EVALUATION WAS PERFORMED  Charlett Blake 01/07/2022, 9:01 AM

## 2022-01-07 NOTE — Progress Notes (Signed)
Speech Language Pathology Daily Session Note  Patient Details  Name: MANUELITO POAGE MRN: [id number] Date of Birth: 11-16-1931  Today's Date: 01/07/2022 SLP Individual Time: 8887-5797 SLP Individual Time Calculation (min): 43 min  Short Term Goals: Week 1: SLP Short Term Goal 1 (Week 1): Pt will tolerate Dys 1/NTL diet with minimal s/s aspiration provided mod A cues for swallow precautions SLP Short Term Goal 2 (Week 1): Pt will tolerate trials of Dys 2 and thin liquids with SLP only with minimal s/s aspiration to determine appropriateness for follow up MBS or diet upgrade SLP Short Term Goal 3 (Week 1): Pt will participate in further cognitive testing to determine current impairments and guide POC SLP Short Term Goal 4 (Week 1): Pt will increase sustained attention during functional tasks to 5-7 minutes provided mod A cues SLP Short Term Goal 5 (Week 1): Pt will solve basic problems during functional tasks provided mod A cues SLP Short Term Goal 6 (Week 1): Pt will utilize speech intelligibility strategies as trained provided max A cues to achieve 70% intelligibility at phrase level  Skilled Therapeutic Interventions:Skilled ST services focused on swallow and cognitive skills. Pt demonstrated wet vocal quality upon SLP entering the room. Pt required max A verbal cues to recall strategies to reduced s/s aspiration. Pt was able to eventually expel secretions after x4-5 coughs and then effortful swallow resulting in clear vocal quality. Throughout the session pt required consistent cuing to notice wet vocal quality and to recall steps to clearing secretions. SLP facilitated PO consumption of nectar thick liquids via straw, pt demonstrated no overt s/s aspiration only requiring max A fade to mod A verbal cues for small sips. Pt demonstrated basic problem solving and sustained attention in 3 step picture card sequencing task of ADL events, pt required max A fade to mod A verbal cues when broken down to  step by step directions for functional sequencing and mod A verbal cues for verbal sequencing. Pt was left in room with call bell within reach and bed alarm set. SLP recommends to continue skilled services.     Pain Pain Assessment Pain Scale: 0-10 Pain Score: 0-No pain  Therapy/Group: Individual Therapy  Arvon Schreiner  [facility] 01/07/2022, 11:40 AM

## 2022-01-07 NOTE — Progress Notes (Signed)
Occupational Therapy Session Note  Patient Details  Name: Chad [name] MRN: [id number] Date of Birth: 06-Aug-1931  Today's Date: 01/07/2022 OT Individual Time: 7972-8206 OT Individual Time Calculation (min): 78 min    Short Term Goals: Week 1:  OT Short Term Goal 1 (Week 1): Pt will maintain static sitting balance EOB with CGA > 5 min. OT Short Term Goal 2 (Week 1): Pt will don shirt with mod A. OT Short Term Goal 3 (Week 1): Pt will complete >2 grooming tasks seated at sink with S. OT Short Term Goal 4 (Week 1): Pt will complete toilet transfer with maxA and LRAD.  Skilled Therapeutic Interventions/Progress Updates:     Pt received awake semi-reclined in bed, no c/o pain, agreeable to therapy. Session focus on self-care retraining, activity tolerance, transfer retraining in prep for improved ADL/IADL/func mobility performance + decreased caregiver burden.  BP in supine read as 135/57 (77). Pt requesting to go to bathroom. Came to sitting EOB with max A to progress L hemibody and to lift trunk. Stedy transfer with max A of 1 to power up, +2 present to steer. Noted increase in pushing with RUE this date.   Total A of 2 for toileting task post continent void of bladder.   Total A of 2 to don pants, max A to come into standing at sink. Pt doffed shirt with max A to remove head/BUE. Applied deodorant with total A to incorporate LUE functionally. Donned new shirt with total A . Pt noted to perseverate with RUE on pulling off shirt instead of pulling over trunk as instructed. Total A to don B teds and shoes.  Pt completed SB transfer to and from mat table with total A, +2 present to stabilize board.  Pt completed the following games on BITS to target trunk control, L visual scanning, and midline orientation with use of drumstick as stylus in RUE:  Game:Single Target Accuracy: 43.64% Reaction Time: 4.97 secs Duration Time: 2 min Hits: 24 hits  Accuracy: 66.6% Reaction Time: 4.19 seconds  Duration Time: 2 min Hits: 28 hits  Pt left tilted back in TIS with safety belt alarm engaged, call bell in reach, and all immediate needs met.  LPN present.  Therapy Documentation Precautions:  Precautions Precautions: Fall Precaution Comments: dense L hemi/neglect Restrictions Weight Bearing Restrictions: Yes  Pain: no c/o throughout   ADL: See Care Tool for more details.   Therapy/Group: Individual Therapy  Volanda Napoleon MS, OTR/L  01/07/2022, 6:51 AM

## 2022-01-07 NOTE — Patient Care Conference (Incomplete)
Inpatient RehabilitationTeam Conference and Plan of Care Update Date: 01/07/2022   Time: 5:17 AM    Patient Name: Chad [name]      Medical Record Number: [id number]  Date of Birth: March 04, 1932 Sex: Male         Room/Bed: 4W03C/4W03C-01 Payor Info: Payor: [employer] MEDICARE / Plan: [employer] MEDICARE / Product Type: *No Product type* /    Admit Date/Time:  01/02/2022  3:40 PM  Primary Diagnosis:  Right middle cerebral artery stroke [facility])  Hospital Problems: Principal Problem:   Right middle cerebral artery stroke [facility])    Expected Discharge Date:    Team Members Present:       Current Status/Progress Goal Weekly Team Focus  Bowel/Bladder   incontinent to bowel and bladder  regian continence  encourage toileting   Swallow/Nutrition/ Hydration             ADL's             Mobility             Communication             Safety/Cognition/ Behavioral Observations            Pain   denies pain  denies pain  assess every shift   Skin   left arm abrasion  wound to show signs of healing        Discharge Planning:      Team Discussion: *** Patient on target to meet rehab goals: {IP REHAB YES/NO WITH QJJHERDEY:81448}  *See Care Plan and progress notes for long and short-term goals.   Revisions to Treatment Plan:  ***  Teaching Needs: ***  Current Barriers to Discharge: {BARRIERS TO JEHUDJSHF:02637}  Possible Resolutions to Barriers: ***     Medical Summary               I attest that I was present, lead the team conference, and concur with the assessment and plan of the team.   [facility] 01/07/2022, 5:17 AM

## 2022-01-07 NOTE — Progress Notes (Signed)
Patient ID: Chad [name], male   DOB: 1932/05/16, 88 y.o.   MRN: [id number]  Patient daughter requesting to have chaplain services on Friday at noon to discuss HCPOA.

## 2022-01-08 MED ORDER — SODIUM CHLORIDE 0.45 % IV SOLN: INTRAVENOUS | Status: DC

## 2022-01-08 NOTE — Progress Notes (Signed)
PROGRESS NOTE   Subjective/Complaints:   DIscussed need for 24/7 care post d/c Pt without c/o DIscussed need to increase po fluid intake   ROS: No SOB, CP, nausea, vomiting, or pain.    Objective:   No results found. No results for input(s): WBC, HGB, HCT, PLT in the last 72 hours.  No results for input(s): NA, K, CL, CO2, GLUCOSE, BUN, CREATININE, CALCIUM in the last 72 hours.   Intake/Output Summary (Last 24 hours) at 01/08/2022 0835 Last data filed at 01/07/2022 1740 Gross per 24 hour  Intake 353 ml  Output --  Net 353 ml         Physical Exam: Vital Signs Blood pressure (!) 153/62, pulse 64, temperature 97.9 F (36.6 C), resp. rate 16, height 5' 1"  (1.549 m), weight 54.7 kg, SpO2 100 %.   General: No acute distress Mood and affect are appropriate Heart: Regular rate and rhythm no rubs murmurs or extra sounds Lungs: Clear to auscultation, breathing unlabored, no rales or wheezes Abdomen: Positive bowel sounds, soft nontender to palpation, nondistended Extremities: No clubbing, cyanosis, or edema Skin: No evidence of breakdown, no evidence of rash Neurologic: Cranial nerves II through XII intact,  0/5 LUE and LLE Reduced sensation left side  Skin: Clean and intact without signs of breakdown Neuro: +dysarthria.  Can follow simple commands.  L inattention.   Assessment/Plan: 1. Functional deficits which require 3+ hours per day of interdisciplinary therapy in a comprehensive inpatient rehab setting. Physiatrist is providing close team supervision and 24 hour management of active medical problems listed below. Physiatrist and rehab team continue to assess barriers to discharge/monitor patient progress toward functional and medical goals  Care Tool:  Bathing    Body parts bathed by patient: Face, Left arm, Right upper leg   Body parts bathed by helper: Right arm, Chest, Abdomen, Left upper leg, Right lower  leg, Left lower leg     Bathing assist Assist Level: Total Assistance - Patient < 25%     Upper Body Dressing/Undressing Upper body dressing   What is the patient wearing?: Hospital gown only    Upper body assist Assist Level: Total Assistance - Patient < 25%    Lower Body Dressing/Undressing Lower body dressing      What is the patient wearing?: Incontinence brief     Lower body assist Assist for lower body dressing: 2 Helpers     Toileting Toileting    Toileting assist Assist for toileting: 2 Helpers     Transfers Chair/bed transfer  Transfers assist  Chair/bed transfer activity did not occur: Safety/medical concerns  Chair/bed transfer assist level: 2 Helpers     Locomotion Ambulation   Ambulation assist   Ambulation activity did not occur: Safety/medical concerns          Walk 10 feet activity   Assist  Walk 10 feet activity did not occur: Safety/medical concerns        Walk 50 feet activity   Assist Walk 50 feet with 2 turns activity did not occur: Safety/medical concerns         Walk 150 feet activity   Assist Walk 150 feet activity did not occur:  Safety/medical concerns         Walk 10 feet on uneven surface  activity   Assist Walk 10 feet on uneven surfaces activity did not occur: Safety/medical concerns         Wheelchair     Assist Is the patient using a wheelchair?: Yes Type of Wheelchair: Manual Wheelchair activity did not occur: Safety/medical concerns         Wheelchair 50 feet with 2 turns activity    Assist    Wheelchair 50 feet with 2 turns activity did not occur: Safety/medical concerns       Wheelchair 150 feet activity     Assist  Wheelchair 150 feet activity did not occur: Safety/medical concerns       Blood pressure (!) 153/62, pulse 64, temperature 97.9 F (36.6 C), resp. rate 16, height 5' 1"  (1.549 m), weight 54.7 kg, SpO2 100 %.  Medical Problem List and Plan: 1.  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 Formulized individual treatment plan based on medical history, underlying problem and comorbidities.              -Start PT/OT/SLP with CIR  Low BP limit to bedside tx today  2.  Antithrombotics: -DVT/anticoagulation:  Pharmaceutical: Lovenox             -antiplatelet therapy: Aspirin 81 mg daily and Brilinta 90 mg twice daily 6/3 Continue above regimen 3. Pain Management: Hydrocodone as needed 6/3 No issues with pain  today 4. Mood: Provide emotional support             -antipsychotic agents: N/A 5. Neuropsych: This patient is capable of making decisions on his own behalf. 6. Skin/Wound Care: Routine skin checks 7. Fluids/Electrolytes/Nutrition: Routine in and outs with follow-up chemistry 8.  Dysphagia.  Dysphagia #1 nectar thick liquid.  Monitor hydration.  Follow-up speech therapy 9.  Hypertension.  Norvasc 5 mg daily, Tenormin 100 mg daily.  Monitor with increased mobility -Continue to monitor, permissive HTN   Norvasc 5 mg daily, 100 mg Tenormin daily.Also on flomax      01/08/2022    4:43 AM 01/07/2022    7:23 PM 01/07/2022   12:51 PM  Vitals with BMI  Weight 120 lbs 9 oz    BMI 43.5    Systolic 686 168 372  Diastolic 62 83 61  Pulse 64 60 65  Mild systolic elevation had low systolic 2 d ago which responded to IVF , no ortho drop cont to monitor will tighten control prior to d/c 10.  Hyperlipidemia.  Crestor 11.  BPH.  Flomax 0.4 mg daily.  hold for hypotension  + orthostatic BP drop but no compensatory tachy, likely BB effect  12.  CKD  stage III.      Latest Ref Rng & Units 01/04/2022    6:35 AM 01/03/2022    5:38 AM 01/02/2022    5:48 AM  BMP  Glucose 70 - 99 mg/dL 109   110   123  BUN 8 - 23 mg/dL 29   29   25     Creatinine 0.61 - 1.24 mg/dL 1.30   1.48   1.45    Sodium 135 - 145 mmol/L 150   148   147    Potassium 3.5 - 5.1 mmol/L 3.5   3.5   4.1    Chloride 98 - 111 mmol/L 121   122   123    CO2 22 - 32 mmol/L 23   20   17     Calcium 8.9 - 10.3 mg/dL 8.7   8.4   8.1    Na+ , Cl- elevated , some fluid contraction , will give .45 NaCL bolus   13. Constipation- will give Sorbitol if no BM  6/6 LBM today 14, Dispo- family member can stay overnight 15. Hypernatremia mild, encourage fluid intake, repeat labs ordered 16. Hypoalbuminemia,   17.  Tachycardia,but mildly bradycardic on EKG, rate 61, NSR, tracing without appreciable change since 12/30/21  LOS: 6 days A FACE TO FACE EVALUATION WAS PERFORMED  Charlett Blake 01/08/2022, 8:35 AM

## 2022-01-08 NOTE — Progress Notes (Signed)
Occupational Therapy Session Note  Patient Details  Name: Chad [name] MRN: [id number] Date of Birth: 11-Feb-1932  Today's Date: 01/08/2022 OT Individual Time: 3202-3343 OT Individual Time Calculation (min): 62 min    Short Term Goals: Week 1:  OT Short Term Goal 1 (Week 1): Pt will maintain static sitting balance EOB with CGA > 5 min. OT Short Term Goal 2 (Week 1): Pt will don shirt with mod A. OT Short Term Goal 3 (Week 1): Pt will complete >2 grooming tasks seated at sink with S. OT Short Term Goal 4 (Week 1): Pt will complete toilet transfer with maxA and LRAD.  Skilled Therapeutic Interventions/Progress Updates:    Pt received in TIS, no c/o pain, agreeable to therapy. Session focus on self-care retraining, activity tolerance, midline orientation, transfer retraining in prep for improved ADL/IADL/func mobility performance + decreased caregiver burden.  Dep transport to and from gym in [place]. SB transfer to and from mat table total A with +2 present to stabilize board.  Massed practice of reaching across midline to stack/retrieve cones, use of mirror for visual feedback throughout. Pt requires consistent mod to max A to prevent LOB on his L and to maintain upright posture, able to minimally self-correct posture when cued significantly to use mirror. Pt perseverative on stacking cones and needed them to be remove to terminate task.  Completed 2 sit to stands with RUE on back of chair. Noted increase in push and requires heavy max A to achieve fully upright posture and to block LLE.   In room total A to complete thorough oral care due to time constraints with suction tooth brush and assist to hold cup and control sips to drink juice.   Pt left tilted back in TIS with safety belt alarm engaged, call bell in reach, and all immediate needs met.    Therapy Documentation Precautions:  Precautions Precautions: Fall Precaution Comments: dense L hemi/neglect Restrictions Weight Bearing  Restrictions: Yes  Pain: no c/o throughout   ADL: See Care Tool for more details.   Therapy/Group: Individual Therapy  Volanda Napoleon MS, OTR/L  01/08/2022, 6:50 AM

## 2022-01-08 NOTE — Progress Notes (Signed)
P.O fluid intake <1059mL this shift, fluids initiated per order. Sheela Stack, LPN

## 2022-01-08 NOTE — Progress Notes (Signed)
   01/08/22 1540  Clinical Encounter Type  Visited With Family  Visit Type Follow-up  Referral From Nurse  Consult/Referral To Chaplain   Chaplain responded to a spiritual consult for advanced directive education.  I had left the paperwork with the patient the last time I visited today I went over everything with the two family members. They are to be agents for the patient, [place]. They plan to have the paperwork completed tomorrow.  I will return and prepare for the notary at that time.   Danice Goltz [facility]  [phone]

## 2022-01-08 NOTE — Patient Care Conference (Signed)
Inpatient RehabilitationTeam Conference and Plan of Care Update Date: 01/08/2022   Time: 10:14 AM    Patient Name: Chad [name]      Medical Record Number: [id number]  Date of Birth: [birth year] Sex: Male         Room/Bed: 4W03C/4W03C-01 Payor Info: Payor: [employer] MEDICARE / Plan: [facility] MEDICARE / Product Type: *No Product type* /    Admit Date/Time:  01/02/2022  3:40 PM  Primary Diagnosis:  Right middle cerebral artery stroke [facility])  Hospital Problems: Principal Problem:   Right middle cerebral artery stroke [facility])    Expected Discharge Date: Expected Discharge Date: 01/31/22  Team Members Present: Physician leading conference: Dr. Alysia Penna Social Worker Present: Erlene Quan, BSW Nurse Present: Dorien Chihuahua, RN PT Present: Barrie Folk, PT OT Present: Providence Lanius, OT SLP Present: [name], SLP     Current Status/Progress Goal Weekly Team Focus  Bowel/Bladder   incontinent to bowel and bladder  regian continence  encourage toileting   Swallow/Nutrition/ Hydration   dys 1 and nectar thick liquids, max A for small sips and mild oral residue  Supervision A  swallow strategies   ADL's   mod A bathing, max A UB dressing, total A of 2 LB dressing/toileting, stedy transfer > toilet, L pusher, perseverative, no activation noted yet in LUE/L hand  mod A overall, will most likely need to downgrade LB ADL/toileting to max, 24/7 S  LUE NMR, transfer/balance/self-care retraining, pt/family/DME/AE education   Mobility   max assist supine<>sit, max assist squat pivot transfers, max assist sit<>stand using +2 for R HHA, +2 mod assist gait up to 40ft with total assist for L LE management although able to maintain upright with cuing  min/mod assist overall  activity tolerance, pt education, L hemibody NMR, dynamic sitting balance, dynamic standing balance, gait training, midline orientation, L attention   Communication   mod A phrase  Supervision A phrase   speech intelligibility strategies   Safety/Cognition/ Behavioral Observations  max-mod A  Min A  sustained attention, basic problem solving, recall and emergent awareness   Pain   denies pain  denies pain  assess every shift   Skin   left arm abrasion  wound to show signs of healing        Discharge Planning:  Patient anticipating returning back home MOD I. Patient has 1 daughter local and other children live OOS. Daughter Brayton Layman is currently here from [place] (unsure how long). Family aware of thr need for 24/7 supervision   Team Discussion: Patient with large MCA CVA with left hemiparesis. Currently incontinent despite toileting and progress limited by perseveration, requires cues to cease tasks with increased amount of time allowed to perform requests. Function also affected by left pushing, mid line disorientation, and orthostatic issues. Patient on target to meet rehab goals: no, currently needs mod assist for dressing at the sink. Requires max assist for upper body care and total assist for lower body care. Completes sit - stand with mod assist; functional tone helps with gait at the rail. Staff do not anticipate patient will be a functional ambulator at discharge. Currently on a D1 Nectar thick diet and requires mod - max assist for cognition with the goal for discharge from SLP set for min assist. Overall goals for discharge set for CGA/min assist with mod assist for gait.  *See Care Plan and progress notes for long and short-term goals.   Revisions to Treatment Plan:  W/C eval   Teaching Needs: Safety,  cues, transfers, toileting, dietary modifications, medication management, etc  Current Barriers to Discharge: Decreased caregiver support, Incontinence, and Insurance for SNF coverage  Possible Resolutions to Barriers: Family education HH follow up services DME: wheelchair      Medical Summary Current Status: SBP elevated, severe weakness on Left side with neglect , poor trunkal  stability  Barriers to Discharge: Medical stability;Incontinence   Possible Resolutions to [employer] Focus: timed toileting , cont BP management   Continued Need for Acute Rehabilitation Level of Care: The patient requires daily medical management by a physician with specialized training in physical medicine and rehabilitation for the following reasons: Direction of a multidisciplinary physical rehabilitation program to maximize functional independence : Yes Medical management of patient stability for increased activity during participation in an intensive rehabilitation regime.: Yes Analysis of laboratory values and/or radiology reports with any subsequent need for medication adjustment and/or medical intervention. : Yes   I attest that I was present, lead the team conference, and concur with the assessment and plan of the team.   Dorien Chihuahua B 01/08/2022, 5:18 PM

## 2022-01-08 NOTE — Progress Notes (Signed)
Physical Therapy Session Note  Patient Details  Name: Chad [name] MRN: [id number] Date of Birth: June 06, 1932  Today's Date: 01/08/2022 PT Individual Time: 1300-1408 amd [id number] PT Individual Time Calculation (min): 68 min  and 45 min   Short Term Goals: Week 1:  PT Short Term Goal 1 (Week 1): Pt will orient to midline in sitting with 50% of attempts. PT Short Term Goal 2 (Week 1): Pt will perform bed mobility with with consistent Max A. PT Short Term Goal 3 (Week 1): Pt will perform sit<>stand and find upright posture with MaxA +2. PT Short Term Goal 4 (Week 1): Pt will initiate gait training. Week 2:     Skilled Therapeutic Interventions/Progress Updates:   Session 1.   Pt received sitting in WC and agreeable to PT. Pt transported to rail in hall outside [employer]. Sit<>stand with mod-max assist from PT x 5 with cues for attnetion to mirror to imprpove midline. Pregait stepping R and L x 10 bil with mild blocking of the L knee in stance and then max-total A to advance the LLE. Weight shift at [employer] in hall 2 x 10 bilwith max assist from PT and cues for improved RLE position. Gait training at rail in hall x 80f forward and x 525freverse with total A for R weight shift and advancement of the LLE with noted extensor tone allowing stance on the LLE. Sitting balance in WC 2 x 3 minute with min cues for forward weight shift while PT performed adjustements to WC seat back height, angle, and head rest position to allow improved tolerance to sitting to reduce risk of pressure injury. [facility] returned to [facility] and [facility] and all needs met.    Session 2.   Pt received sitting in WC and agreeable to PT. [facility] at [place] walker x 8 throughout session with mod-max assist fom PT with visual feedback from mirror. Pregait stepping in sweedish walker x 10bil with max assist from PT noted to have imrpvoed activation of advancement  ofLLE with BUE supported. Gait training with BUE in sweedish walker 3 x 1595fith max assist +2 for control of AD and advancement of RLE. Squat pivot to and from nustep with mod-max assist from PT. Reciprocal movement training on nustep with mod assist for initiation on the LLE x 5 minutes. Patient returned to room and left sitting in [facility] call [facility] in reach and all needs met.         Therapy Documentation Precautions:  Precautions Precautions: Fall Precaution Comments: dense L hemi/neglect Restrictions Weight Bearing Restrictions: Yes    Vital Signs: Therapy Vitals Temp: 98 F (36.7 C) Temp Source: Oral Pulse Rate: 60 Resp: 20 BP: (!) 117/45 Patient Position (if appropriate): Sitting Oxygen Therapy SpO2: 95 % O2 Device: Room Air Pain: Session 1.  Pain Assessment Pain Scale: 0-10 Pain Score: 0-No pain Session 2.  Pain Assessment Pain Scale: 0-10 Pain Score: 0-No pain    Therapy/Group: Individual Therapy  AusLorie Phenix7/2023, 2:12 PM

## 2022-01-08 NOTE — Progress Notes (Signed)
Patient ID: Chad [name], male   DOB: 18-May-1932, 88 y.o.   MRN: [id number]  Team Conference Report to Patient/Family  Team Conference discussion was reviewed with the patient and caregiver, including goals, any changes in plan of care and target discharge date.  Patient and caregiver express understanding and are in agreement.  The patient has a target discharge date of 01/31/22.  SW spoke with patient daughter, Chad [name] and provided team conference updates. Per daughter her and her sister will be present today.  SW informed family of the recommendation for 24/7 supervision. No additional questions or concerns SW will continue to follow up. Daughter aware of chaplain visit scheduled on Friday and will be present.  Dyanne Iha 01/08/2022, 1:48 PM

## 2022-01-08 NOTE — Progress Notes (Addendum)
Speech Language Pathology Daily Session Note  Patient Details  Name: Chad [name] MRN: [id number] Date of Birth: 1931/10/11  Today's Date: 01/08/2022 SLP Individual Time: 5284-1324 SLP Individual Time Calculation (min): 45 min  Short Term Goals: Week 1: SLP Short Term Goal 1 (Week 1): Pt will tolerate Dys 1/NTL diet with minimal s/s aspiration provided mod A cues for swallow precautions. - Pt with wet vocal quality upon SLP's arrival, which was difficult to clear despite Max multimodal A to produce volitional cough and throat clear. Oral expectoration of pharyngeal secretions via Yankauer x 1. Poor A-P transit and bolus cohesion of pureed textures resulting in significant oral residuals requiring oral suctioning to clear. No additional trials provided and LPN and MD notified.  SLP Short Term Goal 2 (Week 1): Pt will tolerate trials of Dys 2 and thin liquids with SLP only with minimal s/s aspiration to determine appropriateness for follow up MBS or diet upgrade. - Pt with increased wet vocal quality with 1/4 tsp of thin water following oral care. No further thin water trials provided.  SLP Short Term Goal 3 (Week 1): Pt will participate in further cognitive testing to determine current impairments and guide POC. - Did not formally address.   SLP Short Term Goal 4 (Week 1): Pt will increase sustained attention during functional tasks to 5-7 minutes provided mod A cues. - Sustained attention during functional conversational task for 5 minutes with Min-Mod A.  SLP Short Term Goal 5 (Week 1): Pt will solve basic problems during functional tasks provided mod A cues. - Max A for awareness of wet vocal quality and use of volitional throat clear + cough attempts to aid in airway protection/clearance, though does not appear fully effective re: secertion clearance.  SLP Short Term Goal 6 (Week 1): Pt will utilize speech intelligibility strategies as trained provided max A cues to achieve 70%  intelligibility at phrase level. - ~70% at the word level on 80% of verbal attempts.   Skilled Therapeutic Interventions:  Pt seen this date for skilled ST interventions targeting deglutition and cognitive-linguistic goals outlined above. Pt received awake/alert and lying in bed. Transferred from bed to TIS with Max A + 2 to optimize positioning for PO intake. Wet vocal quality at baseline with SLP providing Max multimodal A for partial oral expectoration of pharyngeal secretions via Yankauer. Oral care completed via suction toothbrush given Mod-Max A from SLP. Agreeable to ST intervention.   Please see above for objective data re: pt's performance on targeted goals. Pt educated re: results of instrumental swallow evaluation completed on 6/1 and importance of volitional throat clearing and cough to promote airway clearance; pt will need reinforcement of education given need for consistent Max A to initiate throat clearing when vocal quality was wet.   Pt left TIS with all safety measures activated. Call bell reviewed and within reach and all immediate needs met. Notified LPN and MD of pt's swallow status; particularly since pt is at high risk for dehydration. Will continue to closely monitor for safety and physiological tolerance of currently prescribed diet (D1 + NTL) + full supervision for all PO intake. Continue per current ST POC.  Pain Pt denies pain.  Therapy/Group: Individual Therapy  Shaine Newmark A Missy Baksh 01/08/2022, 11:31 AM

## 2022-01-09 LAB — CREATININE, SERUM
Creatinine, Ser: 1.46 mg/dL — ABNORMAL HIGH (ref 0.61–1.24)
GFR, Estimated: 46 mL/min — ABNORMAL LOW (ref >60)

## 2022-01-09 NOTE — Progress Notes (Signed)
Physical Therapy Session Note  Patient Details  Name: Chad [name] MRN: [id number] Date of Birth: 02/23/1932  Today's Date: 01/09/2022 PT Individual Time: 3748-2707; 8675-4492 PT Individual Time Calculation (min): 45 min and 45 min PT Missed Time: 30 min Missed Time Reason: patient fatigue  Short Term Goals: Week 1:  PT Short Term Goal 1 (Week 1): Pt will orient to midline in sitting with 50% of attempts. PT Short Term Goal 2 (Week 1): Pt will perform bed mobility with with consistent Max A. PT Short Term Goal 3 (Week 1): Pt will perform sit<>stand and find upright posture with MaxA +2. PT Short Term Goal 4 (Week 1): Pt will initiate gait training.  Skilled Therapeutic Interventions/Progress Updates:    Session 1: Pt received seated in bed, agreeable to PT session. No complaints of pain. Assisted pt with donning knee-high TEDs, socks, and pants at bed level. Supine to sit with assist x 2 for LE management and trunk elevation. Assisted pt with donning shirt while seated EOB. Pt is min A for sitting balance due to L lateral lean. Squat pivot transfer to the R with max A x 2. Sit to stand at rail in hallway with mod A, L knee blocked. Use of mirror and 2nd person providing visual targets for pt to maintain upright stance. Pt able to take forward/backward step with RLE and perform R/L lateral weight shifting in standing. Pt left seated in TIS chair in room with needs in reach, quick release belt in place.  Session 2: Pt received seated in TIS chair handed off from SLP following diner[employer] club. Pt agreeable to PT session, no complaints of pain. Pt reports urge to toilet. Sit to stand with mod A to stedy. Pt then reports he has already been incontinent of urine and does not need to void any further. Stedy transfer back to bed with assist x 2 for safety due to significant L lateral lean that pt unable to correct. Sit to supine mod A needed for BLE management. Rolling L/R with mod to max A for  dependent brief change and pericare. Pt not agreeable to get back up to TIS due to fatigue but agreeable to sit up to EOB. Supine to sit with max A for BLE management and trunk elevation. Sitting balance EOB with close Supervision to mod A with gradual increase in lateral lean to the L. Pt able to engage in scanning task to the L to correct head position and work on neglect. Pt also able to reach outside BOS and across midline with RUE for targets. Attempt to have pt assist with pressing TV remote buttons to self-select channel he would like to watch after therapy session. Pt unable to press buttons enough to activate. Obtained soft-touch call button for patient. Pt requests to return to supine due to fatigue. Sit to supine mod A for BLE management. Pt left seated in bed with needs in reach, bed alarm in place. Pt missed 30 min of scheduled therapy session due to fatigue.  Therapy Documentation Precautions:  Precautions Precautions: Fall Precaution Comments: dense L hemi/neglect Restrictions Weight Bearing Restrictions: Yes       Therapy/Group: Individual Therapy   Excell Seltzer, PT, DPT, CSRS 01/09/2022, 3:22 PM

## 2022-01-09 NOTE — Progress Notes (Signed)
Occupational Therapy Session Note  Patient Details  Name: Chad [name] MRN: [id number] Date of Birth: 01-05-1932  Today's Date: 01/09/2022 OT Group Time: 1200-1230 OT Group Time Calculation (min): 30 min   Short Term Goals: Week 1:  OT Short Term Goal 1 (Week 1): Pt will maintain static sitting balance EOB with CGA > 5 min. OT Short Term Goal 2 (Week 1): Pt will don shirt with mod A. OT Short Term Goal 3 (Week 1): Pt will complete >2 grooming tasks seated at sink with S. OT Short Term Goal 4 (Week 1): Pt will complete toilet transfer with maxA and LRAD. Week 2:    Week 3:     Skilled Therapeutic Interventions/Progress Updates:    Runner, broadcasting/film/video with SLp with focus on positioning of left Ue in safe position for eating, self feeding of Dys 1, Nectar Thick liquids with max A, management with cues for anterior spillage, following safety precautions related to swallowing and max cues for visual attention to midline.   Therapy Documentation Precautions:  Precautions Precautions: Fall Precaution Comments: dense L hemi/neglect Restrictions Weight Bearing Restrictions: Yes  Pain: No indications of pain   Therapy/Group: Group Therapy  Willeen Cass [facility] 01/09/2022, 3:02 PM

## 2022-01-09 NOTE — Progress Notes (Signed)
Speech Language Pathology Daily Session Note  Patient Details  Name: Chad [name] MRN: [id number] Date of Birth: 08/22/1931  Today's Date: 01/09/2022 SLP Group Time: 1230-1300 SLP Group Time Calculation (min): 30 min  Short Term Goals: Week 1: SLP Short Term Goal 1 (Week 1): Pt will tolerate Dys 1/NTL diet with minimal s/s aspiration provided mod A cues for swallow precautions SLP Short Term Goal 2 (Week 1): Pt will tolerate trials of Dys 2 and thin liquids with SLP only with minimal s/s aspiration to determine appropriateness for follow up MBS or diet upgrade SLP Short Term Goal 3 (Week 1): Pt will participate in further cognitive testing to determine current impairments and guide POC SLP Short Term Goal 4 (Week 1): Pt will increase sustained attention during functional tasks to 5-7 minutes provided mod A cues SLP Short Term Goal 5 (Week 1): Pt will solve basic problems during functional tasks provided mod A cues SLP Short Term Goal 6 (Week 1): Pt will utilize speech intelligibility strategies as trained provided max A cues to achieve 70% intelligibility at phrase level  Skilled Therapeutic Interventions: Skilled group treatment with OT in [place] focused on dysphagia and cognitive-linguistic goals. SLP facilitated session by providing Max A verbal and tactile cues for utilization of their swallowing compensatory strategies while consuming Dys. 1 textures with nectar-thick liquids. Intermittent overt s/s of aspiration observed, suspect due to large sips and moderately prolonged AP transmit. Mod verbal cues were needed for attention to bolus with Max verbal cues to self-monitor and correct left buccal pocketing as well as to clear his wet vocal quality. Recommend patient continue current diet. Patient also required Mod verbal and tactile cues for left visual scanning to locate food and drink items. Patient left upright in his wheelchair with alarm on and all needs within reach with oral care  provided at end of session. Continue with current plan of care.       Pain No/Denies Pain   Therapy/Group: Group Therapy  Amore Ackman 01/09/2022, 1:11 PM

## 2022-01-09 NOTE — Progress Notes (Signed)
PROGRESS NOTE   Subjective/Complaints:   Discussed d/c date , oriented to self, not month or day or date,  ROS: No SOB, CP, nausea, vomiting, or pain.    Objective:   No results found. No results for input(s): "WBC", "HGB", "HCT", "PLT" in the last 72 hours.  Recent Labs    01/09/22 0528  CREATININE 1.46*     Intake/Output Summary (Last 24 hours) at 01/09/2022 0917 Last data filed at 01/09/2022 0847 Gross per 24 hour  Intake 538 ml  Output --  Net 538 ml         Physical Exam: Vital Signs Blood pressure 135/69, pulse 66, temperature 98.7 F (37.1 C), temperature source Oral, resp. rate 18, height 5\' 1"  (1.549 m), weight 56.1 kg, SpO2 100 %.   General: No acute distress Mood and affect are appropriate Heart: Regular rate and rhythm no rubs murmurs or extra sounds Lungs: Clear to auscultation, breathing unlabored, no rales or wheezes Abdomen: Positive bowel sounds, soft nontender to palpation, nondistended Extremities: No clubbing, cyanosis, or edema Skin: No evidence of breakdown, no evidence of rash Neurologic: Cranial nerves II through XII intact,  0/5 LUE and LLE Reduced sensation left side  Skin: Clean and intact without signs of breakdown Neuro: +dysarthria.  Can follow simple commands.  L inattention.   Assessment/Plan: 1. Functional deficits which require 3+ hours per day of interdisciplinary therapy in a comprehensive inpatient rehab setting. Physiatrist is providing close team supervision and 24 hour management of active medical problems listed below. Physiatrist and rehab team continue to assess barriers to discharge/monitor patient progress toward functional and medical goals  Care Tool:  Bathing    Body parts bathed by patient: Face, Left arm, Right upper leg   Body parts bathed by helper: Right arm, Chest, Abdomen, Left upper leg, Right lower leg, Left lower leg     Bathing assist Assist  Level: Total Assistance - Patient < 25%     Upper Body Dressing/Undressing Upper body dressing   What is the patient wearing?: Hospital gown only    Upper body assist Assist Level: Total Assistance - Patient < 25%    Lower Body Dressing/Undressing Lower body dressing      What is the patient wearing?: Incontinence brief     Lower body assist Assist for lower body dressing: 2 Helpers     Toileting Toileting    Toileting assist Assist for toileting: 2 Helpers     Transfers Chair/bed transfer  Transfers assist  Chair/bed transfer activity did not occur: Safety/medical concerns  Chair/bed transfer assist level: 2 Helpers     Locomotion Ambulation   Ambulation assist   Ambulation activity did not occur: Safety/medical concerns          Walk 10 feet activity   Assist  Walk 10 feet activity did not occur: Safety/medical concerns        Walk 50 feet activity   Assist Walk 50 feet with 2 turns activity did not occur: Safety/medical concerns         Walk 150 feet activity   Assist Walk 150 feet activity did not occur: Safety/medical concerns  Walk 10 feet on uneven surface  activity   Assist Walk 10 feet on uneven surfaces activity did not occur: Safety/medical concerns         Wheelchair     Assist Is the patient using a wheelchair?: Yes Type of Wheelchair: Manual Wheelchair activity did not occur: Safety/medical concerns         Wheelchair 50 feet with 2 turns activity    Assist    Wheelchair 50 feet with 2 turns activity did not occur: Safety/medical concerns       Wheelchair 150 feet activity     Assist  Wheelchair 150 feet activity did not occur: Safety/medical concerns       Blood pressure 135/69, pulse 66, temperature 98.7 F (37.1 C), temperature source Oral, resp. rate 18, height 5\' 1"  (1.549 m), weight 56.1 kg, SpO2 100 %.  Medical Problem List and Plan: 1. 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  Antithrombotics: -DVT/anticoagulation:  Pharmaceutical: Lovenox             -antiplatelet therapy: Aspirin 81 mg daily and Brilinta 90 mg twice daily 6/3 Continue above regimen 3. Pain Management: Hydrocodone as needed 6/3 No issues with pain  today 4. Mood: Provide emotional support             -antipsychotic agents: N/A 5. Neuropsych: This patient is capable of making decisions on his own behalf. 6. Skin/Wound Care: Routine skin checks 7. Fluids/Electrolytes/Nutrition: Routine in and outs with follow-up chemistry 8.  Dysphagia.  Dysphagia #1 nectar thick liquid.  Monitor hydration.  Follow-up speech therapy 9.  Hypertension.  Norvasc 5 mg daily, Tenormin 100 mg daily.  Monitor with increased mobility -Continue to monitor, permissive HTN   Norvasc 5 mg daily, 100 mg Tenormin daily.Also on flomax      01/09/2022    8:27 AM 01/09/2022    4:45 AM 01/08/2022    7:39 PM  Vitals with BMI  Weight  123 lbs 11 oz   BMI  02.54   Systolic 270 623 762  Diastolic 69 51 46  Pulse  66 66  Controlled 6/8 10.  Hyperlipidemia.  Crestor 11.  BPH.  Flomax 0.4 mg daily.  hold for hypotension  + orthostatic BP drop 13mmHg  borderline , no compensatory tachy, likely BB effect  12.  CKD stage III.      Latest Ref Rng & Units 01/09/2022    5:28 AM 01/04/2022    6:35 AM 01/03/2022    5:38 AM  BMP  Glucose 70 - 99 mg/dL  109  110   BUN 8 - 23 mg/dL  29  29   Creatinine 0.61 - 1.24 mg/dL 1.46  1.30  1.48   Sodium 135 - 145 mmol/L  150  148   Potassium 3.5 - 5.1 mmol/L  3.5  3.5   Chloride 98 - 111 mmol/L  121  122   CO2 22 - 32 mmol/L  23  20  Calcium 8.9 -  10.3 mg/dL  8.7  8.4   Na+ , Cl- elevated , some fluid contraction , s/p .45 NaCL bolus   13. Constipation- will give Sorbitol if no BM  6/8 type 6 BM  14, Dispo- family member can stay overnight 15. Hypernatremia mild, encourage fluid intake, repeat labs ordered 16. Hypoalbuminemia,   17.  Tachycardia,but mildly bradycardic on EKG, rate 61, NSR, tracing without appreciable change since 12/30/21  LOS: 7 days A FACE TO FACE EVALUATION WAS PERFORMED  Charlett Blake 01/09/2022, 9:17 AM

## 2022-01-09 NOTE — Progress Notes (Signed)
Occupational Therapy Session Note  Patient Details  Name: Chad [name] MRN: [id number] Date of Birth: [birth year]  Today's Date: 01/09/2022 OT Individual Time: 1224-8250 OT Individual Time Calculation (min): 44 min    Short Term Goals: Week 1:  OT Short Term Goal 1 (Week 1): Pt will maintain static sitting balance EOB with CGA > 5 min. OT Short Term Goal 2 (Week 1): Pt will don shirt with mod A. OT Short Term Goal 3 (Week 1): Pt will complete >2 grooming tasks seated at sink with S. OT Short Term Goal 4 (Week 1): Pt will complete toilet transfer with maxA and LRAD.  Skilled Therapeutic Interventions/Progress Updates:    Pt received in TIS with RN present, no c/o pain and, agreeable to therapy. Session focus on activity tolerance, transfer retraining, midline orientation, LUE NMR in prep for improved ADL/IADL/func mobility performance + decreased caregiver burden.  Dep TIS transport to and from gym. Pt complete SB transfer to and from mat table with total A of 2, RUE in lap to prevent pushing. Min to max A for static sitting balance edge of bed due to R pushing, minimal self improvement with max cuing to use mirror for visual feedback. Pt complete 3 sit to stands with max A and RUE on chair back, assist to achieve fully upright posture and for midline orientation.   Pt completed 1x15 modified sit ups, tossing ball with use of 1 lb dowel rod and ace wrap to L hand, and chest press on modified incline. Cues for midline and to attend to L throughout.  Pt left tilted back in TIS at nurses station awaiting d[employer]. All immediate needs met.   Therapy Documentation Precautions:  Precautions Precautions: Fall Precaution Comments: dense L hemi/neglect Restrictions Weight Bearing Restrictions: Yes  Pain:  No c/o throughout ADL: See Care Tool for more details.   Therapy/Group: Individual Therapy  Volanda Napoleon MS, OTR/L  01/09/2022, 6:54 AM

## 2022-01-10 ENCOUNTER — Inpatient Hospital Stay (HOSPITAL_COMMUNITY): Payer: Medicare Other

## 2022-01-10 LAB — GLUCOSE, CAPILLARY
Glucose-Capillary: 105 mg/dL — ABNORMAL HIGH (ref 70–99)
Glucose-Capillary: 118 mg/dL — ABNORMAL HIGH (ref 70–99)

## 2022-01-10 LAB — MAGNESIUM: Magnesium: 2.1 mg/dL (ref 1.7–2.4)

## 2022-01-10 LAB — PHOSPHORUS: Phosphorus: 3.3 mg/dL (ref 2.5–4.6)

## 2022-01-10 MED ORDER — OSMOLITE 1.5 CAL PO LIQD
1000.0000 mL | ORAL | Status: DC
  Administered 2022-01-10 – 2022-01-14 (×4): 1000 mL
  Filled 2022-01-10 (×4): qty 1000

## 2022-01-10 MED ORDER — ROSUVASTATIN CALCIUM 20 MG PO TABS
20.0000 mg | ORAL_TABLET | Freq: Every day | ORAL | Status: DC
  Administered 2022-01-11 – 2022-01-14 (×4): 20 mg
  Filled 2022-01-10 (×4): qty 1

## 2022-01-10 MED ORDER — ATENOLOL 25 MG PO TABS
100.0000 mg | ORAL_TABLET | Freq: Every morning | ORAL | Status: DC
  Administered 2022-01-11 – 2022-01-14 (×4): 100 mg
  Filled 2022-01-10 (×4): qty 4

## 2022-01-10 MED ORDER — TICAGRELOR 90 MG PO TABS
90.0000 mg | ORAL_TABLET | Freq: Two times a day (BID) | ORAL | Status: DC
  Administered 2022-01-10 – 2022-01-14 (×9): 90 mg
  Filled 2022-01-10 (×9): qty 1

## 2022-01-10 MED ORDER — ASPIRIN 81 MG PO CHEW
81.0000 mg | CHEWABLE_TABLET | Freq: Every day | ORAL | Status: DC
  Administered 2022-01-11 – 2022-01-14 (×4): 81 mg
  Filled 2022-01-10 (×4): qty 1

## 2022-01-10 MED ORDER — TRAZODONE HCL 50 MG PO TABS: 25.0000 mg | ORAL_TABLET | Freq: Every day | ORAL | Status: DC

## 2022-01-10 MED ORDER — FREE WATER
100.0000 mL | Status: DC
  Administered 2022-01-10 – 2022-01-12 (×13): 100 mL

## 2022-01-10 MED ORDER — ADULT MULTIVITAMIN W/MINERALS CH
1.0000 | ORAL_TABLET | Freq: Every day | ORAL | Status: DC
  Administered 2022-01-10 – 2022-01-14 (×5): 1
  Filled 2022-01-10 (×5): qty 1

## 2022-01-10 MED ORDER — HYDROCODONE-ACETAMINOPHEN 7.5-325 MG PO TABS: 1.0000 | ORAL_TABLET | Freq: Three times a day (TID) | ORAL | Status: DC | PRN

## 2022-01-10 MED ORDER — PROSOURCE TF PO LIQD
45.0000 mL | Freq: Every day | ORAL | Status: DC
  Administered 2022-01-10 – 2022-01-14 (×5): 45 mL
  Filled 2022-01-10 (×5): qty 45

## 2022-01-10 MED ORDER — TRAZODONE HCL 50 MG PO TABS
25.0000 mg | ORAL_TABLET | Freq: Every evening | ORAL | Status: DC | PRN
  Administered 2022-01-10: 25 mg
  Filled 2022-01-10: qty 1

## 2022-01-10 MED ORDER — TRAZODONE HCL 50 MG PO TABS
25.0000 mg | ORAL_TABLET | Freq: Every evening | ORAL | Status: DC | PRN
  Administered 2022-01-10: 25 mg via ORAL
  Filled 2022-01-10: qty 1

## 2022-01-10 MED ORDER — AMLODIPINE BESYLATE 5 MG PO TABS
5.0000 mg | ORAL_TABLET | Freq: Every day | ORAL | Status: DC
  Administered 2022-01-11 – 2022-01-14 (×4): 5 mg
  Filled 2022-01-10 (×4): qty 1

## 2022-01-10 NOTE — Progress Notes (Signed)
Physical Therapy Weekly Progress Note  Patient Details  Name: Chad [name] MRN: [id number] Date of Birth: Apr 22, 1932  Beginning of progress report period: January 03, 2022 End of progress report period: January 10, 2022  Today's Date: 01/10/2022 PT Individual Time: 0855-1005 PT Individual Time Calculation (min): 70 min   Patient has met {number 1-5:22450} of {number 1-5:20334} short term goals.  ***  Patient continues to demonstrate the following deficits {impairments:3041632} and therefore will continue to benefit from skilled PT intervention to increase functional independence with mobility.  Patient {LTG progression:3041653}.  {plan of UXLK:4401027}  PT Short Term Goals {OZD:6644034}  Skilled Therapeutic Interventions/Progress Updates:   Pt received supine in bed and agreeable to PT. Supine>sit transfer with *** assist and ***cues   Clothing management from EOB.   Transfer.s   Sit<>stand in eva walker.   Gait in eva walking.   Sit<>stand with PFRW  Seated NMR for trunkal control, balance, and midline.   Patient returned to room and left sitting in [facility] with call bell in reach and all needs met.        Therapy Documentation Precautions:  Precautions Precautions: Fall Precaution Comments: dense L hemi/neglect Restrictions Weight Bearing Restrictions: Yes General:   Vital Signs:   Pain:   Vision/Perception     Mobility:   Locomotion :    Trunk/Postural Assessment :    Balance:   Exercises:   Other Treatments:     Therapy/Group: {Therapy/Group:3049007}  Lorie Phenix 01/10/2022, 10:12 AM

## 2022-01-10 NOTE — Progress Notes (Signed)
Speech Language Pathology Weekly Progress and Session Note  Patient Details  Name: Chad [name] MRN: [id number] Date of Birth: 11-May-1932  Beginning of progress report period: January 03, 2022 End of progress report period: January 10, 2022  Today's Date: 01/10/2022 SLP Individual Time: 8101-7510 SLP Individual Time Calculation (min): 60 min  Short Term Goals: Week 1: SLP Short Term Goal 1 (Week 1): Pt will tolerate Dys 1/NTL diet with minimal s/s aspiration provided mod A cues for swallow precautions SLP Short Term Goal 1 - Progress (Week 1): Not met SLP Short Term Goal 2 (Week 1): Pt will tolerate trials of Dys 2 and thin liquids with SLP only with minimal s/s aspiration to determine appropriateness for follow up MBS or diet upgrade SLP Short Term Goal 2 - Progress (Week 1): Not met SLP Short Term Goal 3 (Week 1): Pt will participate in further cognitive testing to determine current impairments and guide POC SLP Short Term Goal 3 - Progress (Week 1): Met SLP Short Term Goal 4 (Week 1): Pt will increase sustained attention during functional tasks to 5-7 minutes provided mod A cues SLP Short Term Goal 4 - Progress (Week 1): Met SLP Short Term Goal 5 (Week 1): Pt will solve basic problems during functional tasks provided mod A cues SLP Short Term Goal 5 - Progress (Week 1): Not met SLP Short Term Goal 6 (Week 1): Pt will utilize speech intelligibility strategies as trained provided max A cues to achieve 70% intelligibility at phrase level SLP Short Term Goal 6 - Progress (Week 1): Not met    New Short Term Goals: Week 2: SLP Short Term Goal 1 (Week 2): Pt will participate in pharyngeal strengthening exercises with good effort and accuracy given Mod A. SLP Short Term Goal 2 (Week 2): Pt will participate in controlled PO trials of ice chips with minimal s/sx concerning for aspiration to assess readiness for repeat instrumental swallow study. SLP Short Term Goal 3 (Week 2): Pt will solve basic  problems during functional tasks provided mod A cues SLP Short Term Goal 4 (Week 2): Pt will utilize speech intelligibility strategies as trained provided max A cues to achieve 70% intelligibility at phrase level SLP Short Term Goal 5 (Week 2): Pt will attend to L visual field on 50% of trials given Max A.  Weekly Progress Updates: Pt has demonstrated minimal functional gains this past week as evident by meeting only 2 out of 6 short-term goals set at time of initial evaluation. Pt exhibits consistent s/sx concerning for aspiration with Dysphagia 1/puree textures, honey-thick liquids, and nectar-thick liquids to include wet vocal quality, coughing, throat clearing, watery eyes, and poor awareness of oral residuals resulting in use of suctioning via Yankauer for removal. Plan to complete repeat MBSS given worsening clinical presentation from initial MBSS on 01/02/2022.    Re: cognition, pt oriented x 3 and benefits from extended processing time. Current limitations include cognitive deficits in attention, recall, executive functioning, and visuospatial skills. Additionally, pt with poor awareness and insight into deficits, as well as poor visual attention to L field. Follows simple commands with extra processing time and verbal cues, and communicates best when asked yes/no and/or close-ended questions. Pt and family education is ongoing. Continue to recommend ST intervention during CIR admission, and anticipate need for 24/7 supervision/assistance and OP or HH ST intervention at time of discharge.  Intensity: Minumum of 1-2 x/day, 30 to 90 minutes Frequency: 3 to 5 out of 7 days Duration/Length of Stay:  3-3.5 weeks Treatment/Interventions: Cognitive remediation/compensation;Environmental controls;Internal/external aids;Therapeutic Exercise;Therapeutic Activities;Patient/family education;Functional tasks;Dysphagia/aspiration precaution training;Cueing hierarchy   Daily Session  Skilled Therapeutic  Interventions:  Pt seen this date for skilled ST intervention targeting deglutition goals outlined above. Pt received sleeping in bed; aroused to name. Repositioned in bed with help of NT. Agreeable to ST intervention.   SLP facilitated today's session by providing Max A for implementation of aspiration precautions during consumption of AM meal of recommended diet textures (Dysphagia 1 + NTL). Despite implementation of aspiration precautions, pt continues to exhibit consistent s/sx concerning for aspiration with pureed textures and NTL via single cup sips. S/sx observed include watery eyes, coughing, wet vocal quality (which pt was unable to clear), and throat clearing. Additionally, pt with mild-moderate oral residuals which were unsuccessfully cleared with liquid rinse or lingual sweep due to poor awareness; therefore, oral suctioning via Yankauer was provided. Pt oriented to self, location, and situation with Mod I. Attended to tasks for 5 minutes with Mod A. Minimal attention to L visual field without Max verbal and visual A + therapist standing on L side. By the end of AM meal, pt only consumed ~10% of pureed texture and 4 oz of NTL and 2 oz of HTL via cup. Recommend repeat MBSS to further assess tolerance of current diet textures and need for temporary means of nutrition and hydration.   Pt left in bed with all safety measures activated, call bell within reach, and all immediate needs met. Continue per updated ST POC.  Pain Pain Assessment Pain Scale: 0-10 Pain Score: 0-No pain  Therapy/Group: Individual Therapy  Avyukt Cimo A Dajae Kizer 01/10/2022, 1:49 PM

## 2022-01-10 NOTE — Progress Notes (Signed)
Initial Nutrition Assessment  DOCUMENTATION CODES:   Severe malnutrition in context of social or environmental circumstances  INTERVENTION:   Tube Feeds via Cortrak: Start Osmolite 1.5 @ 25 mL/hr and advance by 10 mL q6h until goal rate of 55 mL/hr is reached (1155 mL/day) 45 mL ProSource TF - daily 150 mL free water flush q4h or per MD Provides 1773 kcal, 83 gm protein, 880 mL free water (1780 mL total free water) daily.  Monitor magnesium, potassium, and phosphorus BID for at least 3 days, MD to replete as needed, as pt is at risk for refeeding syndrome given malnutrition and poor PO. Multivitamin w/ minerals daily per tube  NUTRITION DIAGNOSIS:   Severe Malnutrition related to social / environmental circumstances as evidenced by severe muscle depletion, severe fat depletion.  GOAL:   Patient will meet greater than or equal to 90% of their needs  MONITOR:   TF tolerance, Diet advancement, I & O's, Labs  REASON FOR ASSESSMENT:   Consult Enteral/tube feeding initiation and management  ASSESSMENT:   88 y.o. male admitted to CIR after a CVA with L side weakness and dysphagia. PMH includes CKD III, HTN, and HLD.   6/09 - NPO; Cortrak placed (tip gastric)  Pt provided RD with minimal answers. Reports that he ate oatmeal with blueberries and strawberries at home. Per H&P, pt lived alone prior to admission.  Unable to provide if he has had any weight loss recently but stated that he has never been a big guy.   Discussed starting nutrition via Cortrak with pt.  Pt was previously on a Dysphagia 1 diet, with Nectar thick liquids. Pt PO intake ranged from 25-60% of meal completions.   Medications reviewed.  Labs reviewed.    NUTRITION - FOCUSED PHYSICAL EXAM:  Flowsheet Row Most Recent Value  Orbital Region Severe depletion  Upper Arm Region Moderate depletion  Thoracic and Lumbar Region Moderate depletion  Buccal Region Severe depletion  Temple Region Severe depletion   Clavicle Bone Region Severe depletion  Clavicle and Acromion Bone Region Severe depletion  Scapular Bone Region Severe depletion  Dorsal Hand Severe depletion  Patellar Region Severe depletion  Anterior Thigh Region Severe depletion  Posterior Calf Region Severe depletion  Edema (RD Assessment) Mild  Hair Reviewed  Eyes Unable to assess  Mouth Reviewed  Skin Reviewed  Nails Reviewed   Diet Order:   Diet Order             Diet NPO time specified  Diet effective now                   EDUCATION NEEDS:   Not appropriate for education at this time  Skin:  Skin Assessment: Reviewed RN Assessment  Last BM:  6/9  Height:   Ht Readings from Last 1 Encounters:  01/02/22 5\' 1"  (1.549 m)    Weight:   Wt Readings from Last 1 Encounters:  01/10/22 54.8 kg    Ideal Body Weight:  50.9 kg  BMI:  Body mass index is 22.83 kg/m.  Estimated Nutritional Needs:   Kcal:  1600-1800  Protein:  80-95 grams  Fluid:  >/= 1.6 L    Hermina Barters RD, LDN Clinical Dietitian See [facility] for contact information.

## 2022-01-10 NOTE — Progress Notes (Signed)
Occupational Therapy Weekly Progress Note  Patient Details  Name: Chad [name] MRN: [id number] Date of Birth: 1932-03-30  Beginning of progress report period: January 03, 2022 End of progress report period: January 10, 2022  Today's Date: 01/10/2022 OT Individual Time: 3762-8315 OT Individual Time Calculation (min): 58 min    Patient has met 0 of 4 short term goals.  Pt has made slow progress this week towards LTG. Pt has demonstrated improved activity tolerance, L visual attention, and static sitting balance, but ultimately requires max to total A for UB D/bathing, total A of 2 for LB dressing/bathing, and toileting tasks. Pt cont to be primarily limited by dense L hemi, pusher syndrome, L inattention, cognitive deficits, and perseverative tendencies. RUE and hand currently assessed at Brummstrom level I and I respectively. Anticipate 24/7 S and mod to max physical assist required upon DC.   Patient continues to demonstrate the following deficits: muscle weakness, impaired timing and sequencing, abnormal tone, unbalanced muscle activation, decreased coordination, and decreased motor planning, decreased midline orientation, decreased attention to left, and decreased motor planning, decreased initiation, decreased attention, decreased awareness, decreased problem solving, decreased safety awareness, decreased memory, and delayed processing, and decreased sitting balance, decreased standing balance, decreased postural control, hemiplegia, and decreased balance strategies and therefore will continue to benefit from skilled OT intervention to enhance overall performance with BADL and Reduce care partner burden.  Patient progressing toward long term goals..  Continue plan of care.  OT Short Term Goals Week 1:  OT Short Term Goal 1 (Week 1): Pt will maintain static sitting balance EOB with CGA > 5 min. OT Short Term Goal 1 - Progress (Week 1): Not met OT Short Term Goal 2 (Week 1): Pt will don shirt with  mod A. OT Short Term Goal 2 - Progress (Week 1): Not met OT Short Term Goal 3 (Week 1): Pt will complete >2 grooming tasks seated at sink with S. OT Short Term Goal 3 - Progress (Week 1): Not met OT Short Term Goal 4 (Week 1): Pt will complete toilet transfer with maxA and LRAD. OT Short Term Goal 4 - Progress (Week 1): Not met OT Short Term Goal 5 (Week 1): Pt will complete toilet transfer with maxA of 2 and LRAD. Week 2:  OT Short Term Goal 1 (Week 2): Pt will maintain static sitting balance EOB with CGA > 5 min. OT Short Term Goal 2 (Week 2): Pt will complete >2 grooming tasks seated at sink with S. OT Short Term Goal 3 (Week 2): Pt will don shirt with mod A.  Skilled Therapeutic Interventions/Progress Updates:    Pt received in TIS, no c/o pain, agreeable to therapy. Session focus on transfer retraining, activity tolerance, midline orientation in prep for improved ADL/IADL/func mobility performance + decreased caregiver burden. Dep transport to and from gym in [place].   SB transfer to and from mat table with total A of 2, pt able to assist with removing/placing board.  CGA up to max A throughout for static sitting balance due to increase in pushing/lean with activities. Pt completed 2x10 L lateral leans with max A and visual cuing to promote LUE WB. Pt often perseverative on previous exercises completed with RUE with poor awareness.  Pt practiced identifying and removing sticky notes on his LUE/LLE to promote L visual attention with max multimodal cuing.  Finally, in perched position in stedy, utilized visual cue to overcome pushing to his R, but required max A of 2 to achieve  midline and manage BLE.  Pt left tiled back in TIS with daughters present with safety belt alarm engaged, call bell in reach, and all immediate needs met.    Therapy Documentation Precautions:  Precautions Precautions: Fall Precaution Comments: dense L hemi/neglect Restrictions Weight Bearing Restrictions:  Yes  Pain:  No c/o throughout ADL: See Care Tool for more details.  Therapy/Group: Individual Therapy  Volanda Napoleon MS, OTR/L  01/10/2022, 6:50 AM

## 2022-01-10 NOTE — Progress Notes (Addendum)
Modified Barium Swallow Progress Note  Patient Details  Name: Chad [name] MRN: [id number] Date of Birth: 1932/03/02  Today's Date: 01/10/2022  Modified Barium Swallow completed.  Full report located under Chart Review in the Imaging Section.  Brief recommendations include the following:  Clinical Impression MBSS completed secondary to clinical concerns for aspiration of current diet textures (puree and nectar-thick liquids). At this time, pt presents with severe oropharyngeal dysphagia compounded by moderate to severe cognitive deficits. Additionally, UES dysfunction appreciated, which resulted in significant posterior tracheal sensed aspiration (PAS 7) of pureed texture via interarytenoid space due to overflow of pyriform sinus residuals into laryngeal vestibule. Unfortunately, pharyngeal stasis and pyriform sinus residuals could not clear, despite Max A to elicit secondary swallow, due to poor UES relaxation + mild esophageal backflow to pyriform sinuses. Additionally, delayed sensation of gross aspiration of nectar-thick liquid via tsp was appreciated during the swallow. [name] cough response was weak; therefore ineffective in achieving pulmonary clearance. Pt required tactile, hyoglossal assistance and verbal cues, from SLP, to initiate swallow response during puree trials. It should be noted that this is a marked change from pt's MBSS results documented on 01/02/2022.   Given pt's risk severity for development of aspiration PNA (Total A for oral care, decreased ambulation, deconditioning) and change in swallow function, recommend initiation of NPO with temporary alternate means of nutrition, hydration, and medications at this time. May consider GI referral to assess UES function if improvement is not noted with therapy alone. [name] daughter present and results + recommendations were reviewed with pt an daughter who verbalized agreement with recommendations following education. MD, LPN, and PA  notified of recommendations. Please see imaging report for full details.   Swallow Evaluation Recommendations   Recommended Consults: Consider GI evaluation;Consider esophageal assessment   SLP Diet Recommendations: NPO;Alternative means - temporary via NGT   Medication Administration: Via alternative means   Postural Changes: Other (Comment) (HOB at or above 30 degress when TF's are running)   Oral Care Recommendations: Oral care QID   Other Recommendations: Have oral suction available   Chad [name] A Chad [name] 01/10/2022,8:51 PM

## 2022-01-10 NOTE — Progress Notes (Signed)
Fluid intake <1043ml 7a-7p,started 0.45% sodium chloride at 75 ml an hour. No other issues to report

## 2022-01-10 NOTE — Procedures (Signed)
Cortrak  Person Inserting Tube:  Ander Slade, RD Tube Size:  10 Tube Location:  Left nare Secured by: Bridle Technique Used to Measure Tube Placement:  Marking at nare/corner of mouth Cortrak Secured At:  59 cm   Cortrak Tube Team Note:  Consult received to place a Cortrak feeding tube.   X-ray is required, abdominal x-ray has been ordered by the Cortrak team. Please confirm tube placement before using the Cortrak tube.   If the tube becomes dislodged please keep the tube and contact the Cortrak team at [email or web address] ([email or web address]) for replacement.  If after hours and replacement cannot be delayed, place a NG tube and confirm placement with an abdominal x-ray.    Ranell Patrick, RD, LDN Clinical Dietitian RD pager # available in [place]  After hours/weekend pager # available in [facility]

## 2022-01-10 NOTE — Progress Notes (Signed)
PROGRESS NOTE   Subjective/Complaints:   Eating with SLP, no coughing poor awareness of grits in mouth   ROS: No SOB, CP, nausea, vomiting, or pain.    Objective:   No results found. No results for input(s): "WBC", "HGB", "HCT", "PLT" in the last 72 hours.  Recent Labs    01/09/22 0528  CREATININE 1.46*     Intake/Output Summary (Last 24 hours) at 01/10/2022 0802 Last data filed at 01/10/2022 0700 Gross per 24 hour  Intake 1623.37 ml  Output --  Net 1623.37 ml         Physical Exam: Vital Signs Blood pressure 125/72, pulse 69, temperature 97.7 F (36.5 C), resp. rate 18, height 5\' 1"  (1.549 m), weight 54.8 kg, SpO2 100 %.   General: No acute distress Mood and affect are appropriate Heart: Regular rate and rhythm no rubs murmurs or extra sounds Lungs: Clear to auscultation, breathing unlabored, no rales or wheezes Abdomen: Positive bowel sounds, soft nontender to palpation, nondistended Extremities: No clubbing, cyanosis, or edema Skin: No evidence of breakdown, no evidence of rash Neurologic: Cranial nerves II through XII intact,  0/5 LUE and LLE Reduced sensation left side  Skin: Clean and intact without signs of breakdown Neuro: +dysarthria.  Can follow simple commands.  L inattention.   Assessment/Plan: 1. Functional deficits which require 3+ hours per day of interdisciplinary therapy in a comprehensive inpatient rehab setting. Physiatrist is providing close team supervision and 24 hour management of active medical problems listed below. Physiatrist and rehab team continue to assess barriers to discharge/monitor patient progress toward functional and medical goals  Care Tool:  Bathing    Body parts bathed by patient: Face, Left arm, Right upper leg   Body parts bathed by helper: Right arm, Chest, Abdomen, Left upper leg, Right lower leg, Left lower leg     Bathing assist Assist Level: Total  Assistance - Patient < 25%     Upper Body Dressing/Undressing Upper body dressing   What is the patient wearing?: Hospital gown only    Upper body assist Assist Level: Total Assistance - Patient < 25%    Lower Body Dressing/Undressing Lower body dressing      What is the patient wearing?: Incontinence brief     Lower body assist Assist for lower body dressing: 2 Helpers     Toileting Toileting    Toileting assist Assist for toileting: 2 Helpers     Transfers Chair/bed transfer  Transfers assist  Chair/bed transfer activity did not occur: Safety/medical concerns  Chair/bed transfer assist level: 2 Helpers     Locomotion Ambulation   Ambulation assist   Ambulation activity did not occur: Safety/medical concerns          Walk 10 feet activity   Assist  Walk 10 feet activity did not occur: Safety/medical concerns        Walk 50 feet activity   Assist Walk 50 feet with 2 turns activity did not occur: Safety/medical concerns         Walk 150 feet activity   Assist Walk 150 feet activity did not occur: Safety/medical concerns         Walk 10  feet on uneven surface  activity   Assist Walk 10 feet on uneven surfaces activity did not occur: Safety/medical concerns         Wheelchair     Assist Is the patient using a wheelchair?: Yes Type of Wheelchair: Manual Wheelchair activity did not occur: Safety/medical concerns         Wheelchair 50 feet with 2 turns activity    Assist    Wheelchair 50 feet with 2 turns activity did not occur: Safety/medical concerns       Wheelchair 150 feet activity     Assist  Wheelchair 150 feet activity did not occur: Safety/medical concerns       Blood pressure 125/72, pulse 69, temperature 97.7 F (36.5 C), resp. rate 18, height 5\' 1"  (1.549 m), weight 54.8 kg, SpO2 100 %.  Medical Problem List and Plan: 1. 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  Antithrombotics: -DVT/anticoagulation:  Pharmaceutical: Lovenox             -antiplatelet therapy: Aspirin 81 mg daily and Brilinta 90 mg twice daily 6/3 Continue above regimen 3. Pain Management: Hydrocodone as needed 6/3 No issues with pain  today 4. Mood: Provide emotional support             -antipsychotic agents: N/A 5. Neuropsych: This patient is capable of making decisions on his own behalf. 6. Skin/Wound Care: Routine skin checks 7. Fluids/Electrolytes/Nutrition: Routine in and outs with follow-up chemistry 8.  Dysphagia.  Dysphagia #1 nectar thick liquid.  Monitor hydration.  Follow-up speech therapy 9.  Hypertension.  Norvasc 5 mg daily, Tenormin 100 mg daily.  Monitor with increased mobility -Continue to monitor, permissive HTN   Norvasc 5 mg daily, 100 mg Tenormin daily.Also on flomax      01/10/2022    3:19 AM 01/09/2022    7:55 PM 01/09/2022    3:58 PM  Vitals with BMI  Weight 120 lbs 13 oz    BMI 45.80    Systolic 998 338 250  Diastolic 72 66 58  Pulse 69 64 70  Controlled 6/9 10.  Hyperlipidemia.  Crestor 11.  BPH.  Flomax 0.4 mg daily.  hold for hypotension  + orthostatic BP drop 79mmHg  borderline , no compensatory tachy, likely BB effect  12.  CKD stage III.      Latest Ref Rng & Units 01/09/2022    5:28 AM 01/04/2022    6:35 AM 01/03/2022    5:38 AM  BMP  Glucose 70 - 99 mg/dL  109  110   BUN 8 - 23 mg/dL  29  29   Creatinine 0.61 - 1.24 mg/dL 1.46  1.30  1.48   Sodium 135 - 145 mmol/L  150  148   Potassium 3.5 - 5.1 mmol/L  3.5  3.5   Chloride 98 - 111 mmol/L  121  122   CO2 22 - 32 mmol/L  23  20   Calcium 8.9 - 10.3 mg/dL  8.7  8.4  Na+ , Cl- elevated , some fluid  contraction , s/p .45 NaCL bolus   13. Constipation- will give Sorbitol if no BM  6/8 type 6 BM  14, Dispo- family member can stay overnight 15. Hypernatremia mild, encourage fluid intake, repeat labs ordered 16. Hypoalbuminemia,   17.  Tachycardia,but mildly bradycardic on EKG, rate 61, NSR, tracing without appreciable change since 12/30/21  LOS: 8 days A FACE TO FACE EVALUATION WAS PERFORMED  Charlett Blake 01/10/2022, 8:02 AM

## 2022-01-11 DIAGNOSIS — E43 Unspecified severe protein-calorie malnutrition: Secondary | ICD-10-CM | POA: Insufficient documentation

## 2022-01-11 DIAGNOSIS — I69391 Dysphagia following cerebral infarction: Secondary | ICD-10-CM

## 2022-01-11 LAB — GLUCOSE, CAPILLARY
Glucose-Capillary: 134 mg/dL — ABNORMAL HIGH (ref 70–99)
Glucose-Capillary: 111 mg/dL — ABNORMAL HIGH (ref 70–99)
Glucose-Capillary: 118 mg/dL — ABNORMAL HIGH (ref 70–99)
Glucose-Capillary: 116 mg/dL — ABNORMAL HIGH (ref 70–99)
Glucose-Capillary: 137 mg/dL — ABNORMAL HIGH (ref 70–99)
Glucose-Capillary: 134 mg/dL — ABNORMAL HIGH (ref 70–99)

## 2022-01-11 LAB — PHOSPHORUS
Phosphorus: 2.9 mg/dL (ref 2.5–4.6)
Phosphorus: 2.2 mg/dL — ABNORMAL LOW (ref 2.5–4.6)

## 2022-01-11 LAB — MAGNESIUM
Magnesium: 2 mg/dL (ref 1.7–2.4)
Magnesium: 1.9 mg/dL (ref 1.7–2.4)

## 2022-01-11 NOTE — Progress Notes (Signed)
PROGRESS NOTE   Subjective/Complaints:  Pt up in bed. Wants something to drink. No problems reported. Tolerating feeds  ROS: Limited due to cognitive/behavioral   Objective:   DG Swallowing Func-Speech Pathology  Result Date: 01/10/2022 Table formatting from the original result was not included. Objective Swallowing Evaluation: Type of Study: MBS-Modified Barium Swallow Study  Patient Details Name: Chad [name] MRN: [id number] Date of Birth: Nov 07, 1931 Today's Date: 01/10/2022 Time: SLP Start Time (ACUTE ONLY): 1100 -SLP Stop Time (ACUTE ONLY): 1123 SLP Time Calculation (min) (ACUTE ONLY): 23 min Past Medical History: Past Medical History: Diagnosis Date  Arthritis   Heart murmur   asa child   Hypertension   Stroke [facility])  Past Surgical History: Past Surgical History: Procedure Laterality Date  EYE SURGERY Bilateral 2022  IR ANGIO INTRA EXTRACRAN SEL COM CAROTID INNOMINATE UNI R MOD SED  01/01/2022  IR CT HEAD LTD  01/01/2022  IR PTA INTRACRANIAL  01/01/2022  JOINT REPLACEMENT    RADIOLOGY WITH ANESTHESIA N/A 01/01/2022  Procedure: Angiogram;  Surgeon: Luanne Bras, MD;  Location: [place];  Service: Radiology;  Laterality: N/A;  TONSILLECTOMY    TOTAL KNEE ARTHROPLASTY Right 03/30/2013  Procedure: RIGHT TOTAL KNEE ARTHROPLASTY;  Surgeon: Tobi Bastos, MD;  Location: [facility];  Service: Orthopedics;  Laterality: Right;  TOTAL KNEE ARTHROPLASTY Left 10/09/2020  Procedure: TOTAL KNEE ARTHROPLASTY;  Surgeon: Paralee Cancel, MD;  Location: [facility];  Service: Orthopedics;  Laterality: Left;  70 mins HPI: 88 yo male presenting to the ED on 5/28 with L sided weakness, facial droop, slurred speech, and fall. 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 PMH including hypothyroidism chronic pain syndromes, CKD, HTN, arthritis, bil TKA.  Subjective: alert, friendly  Recommendations for follow up therapy are  one component of a multi-disciplinary discharge planning process, led by the attending physician.  Recommendations may be updated based on patient status, additional functional criteria and insurance authorization. Assessment / Plan / Recommendation   01/10/2022   8:50 PM Clinical Impressions Clinical Impressions SLP Visit Diagnosis MBSS completed secondary to clinical concerns for aspiration of current diet textures (puree and nectar-thick liquids). At this time, pt presents with severe oropharyngeal dysphagia, with significant UES dysfunction, which resulted in significant posterior tracheal sensed aspiration (PAS 7) of pureed texture via interarytenoid space due to overflow of pyriform sinus residuals into laryngeal vestibule. Unfortunately, pharyngeal stasis and pyriform sinus residuals could not clear due to poor UES relaxation with mild esophageal backflow to pyriform sinuses. Additionally, delayed sensation of gross aspiration of nectar-thick liquid via tsp was appreciated during the swallow. [name] cough response was not effective in pulmonary clearance. Pt required tactile, hyoglossal assistance and verbal cues, from SLP, to initiate swallow response during puree trials. It should be noted that this is a significant change from pt's MBSS results documented on 01/02/2022.   Given pt's risk severity for development of aspiration PNA (Total A for oral care, decreased ambulation, deconditioning) and change in swallow function, recommend initiation of NPO with temporary alternate means of nutrition, hydration, and medications at this time. May consider GI referral to assess UES function if improvement is not  noted with therapy alone. [name] daughter present and results + recommendations were reviewed with pt an daughter who verbalized agreement with recommendations following education. MD, LPN, and PA notified of recommendations. Please see imaging report for full details. Dysphagia, oropharyngeal phase  (R13.12);Cognitive communication deficit (R41.841) Impact on safety and function Severe aspiration risk;Risk for inadequate nutrition/hydration     01/02/2022  11:00 AM Treatment Recommendations Treatment Recommendations Therapy as outlined in treatment plan below     01/10/2022   1:59 PM Prognosis Prognosis for Safe Diet Advancement Good Barriers to Reach Goals Cognitive deficits;Severity of deficits   01/10/2022   8:50 PM Diet Recommendations SLP Diet Recommendations NPO;Alternative means - temporary Medication Administration Via alternative means Postural Changes Other (Comment)     01/10/2022   8:50 PM Other Recommendations Recommended Consults Consider GI evaluation;Consider esophageal assessment Oral Care Recommendations Oral care QID Other Recommendations Have oral suction available   01/10/2022   8:50 PM Frequency and Duration  Treatment Duration 2 weeks     01/10/2022   1:53 PM Oral Phase Oral Phase Impaired Oral - Nectar Teaspoon Weak lingual manipulation;Lingual pumping;Delayed oral transit;Decreased bolus cohesion;Premature spillage;Lingual/palatal residue;Piecemeal swallowing;Reduced posterior propulsion Oral - Nectar Cup NT Oral - Nectar Straw NT Oral - Thin Cup NT Oral - Thin Straw NT Oral - Puree Weak lingual manipulation;Lingual pumping;Incomplete tongue to palate contact;Lingual/palatal residue;Delayed oral transit;Decreased bolus cohesion;Premature spillage;Holding of bolus;Piecemeal swallowing Oral - Mech Soft NT Oral - Regular NT Oral - Multi-Consistency NT Oral - Pill NT    01/10/2022   1:54 PM Pharyngeal Phase Pharyngeal Phase Impaired Pharyngeal- Nectar Teaspoon Delayed swallow initiation-pyriform sinuses;Reduced pharyngeal peristalsis;Reduced airway/laryngeal closure;Moderate aspiration;Inter-arytenoid space residue;Pharyngeal residue - cp segment;Pharyngeal residue - posterior pharnyx;Pharyngeal residue - pyriform;Pharyngeal residue - valleculae;Lateral channel residue;Penetration/Aspiration during  swallow;Reduced laryngeal elevation Pharyngeal Material enters airway, passes BELOW cords and not ejected out despite cough attempt by patient Pharyngeal- Nectar Cup NT Pharyngeal- Nectar Straw NT Pharyngeal- Thin Cup NT Pharyngeal- Thin Straw NT Pharyngeal- Puree Delayed swallow initiation-pyriform sinuses;Reduced pharyngeal peristalsis;Reduced anterior laryngeal mobility;Reduced tongue base retraction;Penetration/Apiration after swallow;Moderate aspiration;Pharyngeal residue - pyriform;Pharyngeal residue - posterior pharnyx;Pharyngeal residue - cp segment;Inter-arytenoid space residue;Lateral channel residue;Compensatory strategies attempted (with notebox) Pharyngeal Material enters airway, passes BELOW cords and not ejected out despite cough attempt by patient Pharyngeal- Mechanical Soft NT Pharyngeal- Regular NT Pharyngeal- Multi-consistency NT Pharyngeal- Pill NT    01/10/2022   1:58 PM Cervical Esophageal Phase  Cervical Esophageal Phase Impaired Nectar Teaspoon Reduced cricopharyngeal relaxation Nectar Cup NT Nectar Straw NT Puree Esophageal backflow into the pharynx;Reduced cricopharyngeal relaxation Mechanical Soft NT Regular NT Multi-consistency NT Pill NT Cervical Esophageal Comment Decreased relaxation of the UES observed with significant pyriform sinus residuals Bethany A Lutes 01/10/2022, 9:06 PM                     DG Abd Portable 1V  Result Date: 01/10/2022 CLINICAL DATA:  Feeding tube placement. EXAM: PORTABLE ABDOMEN - 1 VIEW COMPARISON:  None Available. FINDINGS: Feeding tube tip is at the level of the gastric antrum. Small rounded densities are seen in the region of the colon likely related to diverticula. There is a heterogeneous rounded density projecting over the right upper quadrant measuring 3.0 x 3.0 cm. This is indeterminate. Lung bases are clear. There is mild curvature of the lumbar spine with degenerative change. IMPRESSION: 1. Feeding tube tip is at the level of the gastric antrum. 2.  Scattered colonic diverticula. 3. 3 cm heterogeneous density in the right upper  quadrant, indeterminate. Findings may represent a large gallstone, renal calcification or other calcified mass. Please correlate clinically. This can be further evaluated with CT. Electronically Signed   By: Ronney Asters M.D.   On: 01/10/2022 15:07   No results for input(s): "WBC", "HGB", "HCT", "PLT" in the last 72 hours.  Recent Labs    01/09/22 0528  CREATININE 1.46*    Intake/Output Summary (Last 24 hours) at 01/11/2022 0925 Last data filed at 01/11/2022 0537 Gross per 24 hour  Intake 1192.06 ml  Output --  Net 1192.06 ml        Physical Exam: Vital Signs Blood pressure (!) 124/57, pulse 69, temperature 98.4 F (36.9 C), temperature source Oral, resp. rate 18, height 5\' 1"  (1.549 m), weight 56.5 kg, SpO2 100 %.  Constitutional: No distress . Vital signs reviewed. HEENT: NCAT, EOMI, oral membranes dry, NGT Neck: supple Cardiovascular: RRR without murmur. No JVD    Respiratory/Chest: CTA Bilaterally without wheezes or rales. Normal effort    GI/Abdomen: BS +, non-tender, non-distended Ext: no clubbing, cyanosis, or edema Psych: pleasant and cooperative  Skin: No evidence of breakdown, no evidence of rash Neurologic: Cranial nerves II through XII intact 0/5 LUE and LLE--without change, flacid Reduced sensation left side  alert, +dysarthria.  Can follow simple commands.  L inattention.   Assessment/Plan: 1. Functional deficits which require 3+ hours per day of interdisciplinary therapy in a comprehensive inpatient rehab setting. Physiatrist is providing close team supervision and 24 hour management of active medical problems listed below. Physiatrist and rehab team continue to assess barriers to discharge/monitor patient progress toward functional and medical goals  Care Tool:  Bathing    Body parts bathed by patient: Face, Left arm, Right upper leg   Body parts bathed by helper: Right arm,  Chest, Abdomen, Left upper leg, Right lower leg, Left lower leg     Bathing assist Assist Level: Total Assistance - Patient < 25%     Upper Body Dressing/Undressing Upper body dressing   What is the patient wearing?: Hospital gown only    Upper body assist Assist Level: Total Assistance - Patient < 25%    Lower Body Dressing/Undressing Lower body dressing      What is the patient wearing?: Incontinence brief     Lower body assist Assist for lower body dressing: 2 Helpers     Toileting Toileting    Toileting assist Assist for toileting: 2 Helpers     Transfers Chair/bed transfer  Transfers assist  Chair/bed transfer activity did not occur: Safety/medical concerns  Chair/bed transfer assist level: 2 Helpers     Locomotion Ambulation   Ambulation assist   Ambulation activity did not occur: Safety/medical concerns          Walk 10 feet activity   Assist  Walk 10 feet activity did not occur: Safety/medical concerns        Walk 50 feet activity   Assist Walk 50 feet with 2 turns activity did not occur: Safety/medical concerns         Walk 150 feet activity   Assist Walk 150 feet activity did not occur: Safety/medical concerns         Walk 10 feet on uneven surface  activity   Assist Walk 10 feet on uneven surfaces activity did not occur: Safety/medical concerns         Wheelchair     Assist Is the patient using a wheelchair?: Yes Type of Wheelchair: Manual Wheelchair activity did not  occur: Safety/medical concerns         Wheelchair 50 feet with 2 turns activity    Assist    Wheelchair 50 feet with 2 turns activity did not occur: Safety/medical concerns       Wheelchair 150 feet activity     Assist  Wheelchair 150 feet activity did not occur: Safety/medical concerns       Blood pressure (!) 124/57, pulse 69, temperature 98.4 F (36.9 C), temperature source Oral, resp. rate 18, height 5\' 1"  (1.549 m),  weight 56.5 kg, SpO2 100 %.  Medical Problem List and Plan: 1. 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  Antithrombotics: -DVT/anticoagulation:  Pharmaceutical: Lovenox             -antiplatelet therapy: Aspirin 81 mg daily and Brilinta 90 mg twice daily   3. Pain Management: Hydrocodone as needed 6/10 No issues with pain  today 4. Mood: Provide emotional support             -antipsychotic agents: N/A 5. Neuropsych: This patient is capable of making decisions on his own behalf. 6. Skin/Wound Care: Routine skin checks 7. Fluids/Electrolytes/Nutrition: Routine in and outs with follow-up chemistry 8.  Dysphagia.  Pt is NPO on TF  -sponge off tongue 9.  Hypertension.  Norvasc 5 mg daily, Tenormin 100 mg daily.  Monitor with increased mobility -Continue to monitor, permissive HTN   Norvasc 5 mg daily, 100 mg Tenormin daily.Also on flomax      01/11/2022    5:04 AM 01/11/2022    4:12 AM 01/10/2022    7:30 PM  Vitals with BMI  Weight 124 lbs 9 oz    BMI 16.10    Systolic  960 454  Diastolic  57 67  Pulse  69 71  Controlled 6/10 10.  Hyperlipidemia.  Crestor 11.  BPH.  Flomax 0.4 mg daily.  hold for hypotension  + orthostatic BP drop 37mmHg  borderline , no compensatory tachy, likely BB effect  12.  CKD stage III.      Latest Ref Rng & Units 01/09/2022    5:28 AM 01/04/2022    6:35 AM 01/03/2022    5:38 AM  BMP  Glucose 70 - 99 mg/dL  109  110   BUN 8 - 23 mg/dL  29  29   Creatinine 0.61 - 1.24 mg/dL 1.46  1.30  1.48   Sodium 135 - 145 mmol/L  150  148   Potassium 3.5 - 5.1 mmol/L  3.5  3.5   Chloride 98 - 111 mmol/L  121  122   CO2 22 - 32 mmol/L  23  20   Calcium 8.9  - 10.3 mg/dL  8.7  8.4   Na+ , Cl- elevated , some fluid contraction , s/p .45 NaCL bolus  6/10 oral membranes very dry. Check bmet in am 13. Constipation- will give Sorbitol if no BM  6/9 type 5 BM  14, Dispo- family member can stay overnight 15. Hypernatremia mild, encourage fluid intake, repeat labs ordered 16. Hypoalbuminemia,   17.  Tachycardia,but mildly bradycardic on EKG, rate 61, NSR  -stable 6/10  LOS: 9 days A FACE TO FACE EVALUATION WAS PERFORMED  Meredith Staggers 01/11/2022, 9:25 AM

## 2022-01-11 NOTE — Progress Notes (Signed)
Feed rate increased to 35 ml/hr. Pt tolerated well.

## 2022-01-12 LAB — BASIC METABOLIC PANEL
Anion gap: 8 (ref 5–15)
BUN: 30 mg/dL — ABNORMAL HIGH (ref 8–23)
CO2: 22 mmol/L (ref 22–32)
Calcium: 8.3 mg/dL — ABNORMAL LOW (ref 8.9–10.3)
Chloride: 116 mmol/L — ABNORMAL HIGH (ref 98–111)
Creatinine, Ser: 1.3 mg/dL — ABNORMAL HIGH (ref 0.61–1.24)
GFR, Estimated: 53 mL/min — ABNORMAL LOW (ref >60)
Glucose, Bld: 134 mg/dL — ABNORMAL HIGH (ref 70–99)
Potassium: 3.3 mmol/L — ABNORMAL LOW (ref 3.5–5.1)
Sodium: 146 mmol/L — ABNORMAL HIGH (ref 135–145)

## 2022-01-12 LAB — GLUCOSE, CAPILLARY
Glucose-Capillary: 125 mg/dL — ABNORMAL HIGH (ref 70–99)
Glucose-Capillary: 126 mg/dL — ABNORMAL HIGH (ref 70–99)
Glucose-Capillary: 126 mg/dL — ABNORMAL HIGH (ref 70–99)
Glucose-Capillary: 130 mg/dL — ABNORMAL HIGH (ref 70–99)
Glucose-Capillary: 131 mg/dL — ABNORMAL HIGH (ref 70–99)
Glucose-Capillary: 139 mg/dL — ABNORMAL HIGH (ref 70–99)

## 2022-01-12 LAB — PHOSPHORUS: Phosphorus: 1.8 mg/dL — ABNORMAL LOW (ref 2.5–4.6)

## 2022-01-12 LAB — MAGNESIUM: Magnesium: 1.9 mg/dL (ref 1.7–2.4)

## 2022-01-12 MED ORDER — SENNOSIDES-DOCUSATE SODIUM 8.6-50 MG PO TABS
1.0000 | ORAL_TABLET | Freq: Two times a day (BID) | ORAL | Status: DC
  Administered 2022-01-12 (×2): 1 via NASOGASTRIC
  Filled 2022-01-12 (×3): qty 1

## 2022-01-12 MED ORDER — FREE WATER
100.0000 mL | Freq: Four times a day (QID) | Status: DC
Start: 2022-01-13 — End: 2022-01-15
  Administered 2022-01-13 – 2022-01-15 (×9): 100 mL

## 2022-01-12 NOTE — Progress Notes (Signed)
PROGRESS NOTE   Subjective/Complaints:  Pt with a reasonable night. Appears comfortable. Still wants something to drink.   ROS: Limited due to cognitive/behavioral   Objective:   DG Swallowing Func-Speech Pathology  Result Date: 01/10/2022 Table formatting from the original result was not included. Objective Swallowing Evaluation: Type of Study: MBS-Modified Barium Swallow Study  Patient Details Name: Chad [name] MRN: [id number] Date of Birth: 1931-08-11 Today's Date: 01/10/2022 Time: SLP Start Time (ACUTE ONLY): 1100 -SLP Stop Time (ACUTE ONLY): 1123 SLP Time Calculation (min) (ACUTE ONLY): 23 min Past Medical History: Past Medical History: Diagnosis Date  Arthritis   Heart murmur   asa child   Hypertension   Stroke [facility])  Past Surgical History: Past Surgical History: Procedure Laterality Date  EYE SURGERY Bilateral 2022  IR ANGIO INTRA EXTRACRAN SEL COM CAROTID INNOMINATE UNI R MOD SED  01/01/2022  IR CT HEAD LTD  01/01/2022  IR PTA INTRACRANIAL  01/01/2022  JOINT REPLACEMENT    RADIOLOGY WITH ANESTHESIA N/A 01/01/2022  Procedure: Angiogram;  Surgeon: Luanne Bras, MD;  Location: [facility];  Service: Radiology;  Laterality: N/A;  TONSILLECTOMY    TOTAL KNEE ARTHROPLASTY Right 03/30/2013  Procedure: RIGHT TOTAL KNEE ARTHROPLASTY;  Surgeon: Tobi Bastos, MD;  Location: [facility];  Service: Orthopedics;  Laterality: Right;  TOTAL KNEE ARTHROPLASTY Left 10/09/2020  Procedure: TOTAL KNEE ARTHROPLASTY;  Surgeon: Paralee Cancel, MD;  Location: [facility];  Service: Orthopedics;  Laterality: Left;  70 mins HPI: 88 yo male presenting to the ED on 5/28 with L sided weakness, facial droop, slurred speech, and fall. 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 PMH including hypothyroidism chronic pain syndromes, CKD, HTN, arthritis, bil TKA.  Subjective: alert, friendly  Recommendations for follow up therapy  are one component of a multi-disciplinary discharge planning process, led by the attending physician.  Recommendations may be updated based on patient status, additional functional criteria and insurance authorization. Assessment / Plan / Recommendation   01/10/2022   8:50 PM Clinical Impressions Clinical Impressions SLP Visit Diagnosis MBSS completed secondary to clinical concerns for aspiration of current diet textures (puree and nectar-thick liquids). At this time, pt presents with severe oropharyngeal dysphagia, with significant UES dysfunction, which resulted in significant posterior tracheal sensed aspiration (PAS 7) of pureed texture via interarytenoid space due to overflow of pyriform sinus residuals into laryngeal vestibule. Unfortunately, pharyngeal stasis and pyriform sinus residuals could not clear due to poor UES relaxation with mild esophageal backflow to pyriform sinuses. Additionally, delayed sensation of gross aspiration of nectar-thick liquid via tsp was appreciated during the swallow. [name] cough response was not effective in pulmonary clearance. Pt required tactile, hyoglossal assistance and verbal cues, from SLP, to initiate swallow response during puree trials. It should be noted that this is a significant change from pt's MBSS results documented on 01/02/2022.   Given pt's risk severity for development of aspiration PNA (Total A for oral care, decreased ambulation, deconditioning) and change in swallow function, recommend initiation of NPO with temporary alternate means of nutrition, hydration, and medications at this time. May consider GI referral to assess UES function if improvement is not  noted with therapy alone. [name] daughter present and results + recommendations were reviewed with pt an daughter who verbalized agreement with recommendations following education. MD, LPN, and PA notified of recommendations. Please see imaging report for full details. Dysphagia, oropharyngeal phase  (R13.12);Cognitive communication deficit (R41.841) Impact on safety and function Severe aspiration risk;Risk for inadequate nutrition/hydration     01/02/2022  11:00 AM Treatment Recommendations Treatment Recommendations Therapy as outlined in treatment plan below     01/10/2022   1:59 PM Prognosis Prognosis for Safe Diet Advancement Good Barriers to Reach Goals Cognitive deficits;Severity of deficits   01/10/2022   8:50 PM Diet Recommendations SLP Diet Recommendations NPO;Alternative means - temporary Medication Administration Via alternative means Postural Changes Other (Comment)     01/10/2022   8:50 PM Other Recommendations Recommended Consults Consider GI evaluation;Consider esophageal assessment Oral Care Recommendations Oral care QID Other Recommendations Have oral suction available   01/10/2022   8:50 PM Frequency and Duration  Treatment Duration 2 weeks     01/10/2022   1:53 PM Oral Phase Oral Phase Impaired Oral - Nectar Teaspoon Weak lingual manipulation;Lingual pumping;Delayed oral transit;Decreased bolus cohesion;Premature spillage;Lingual/palatal residue;Piecemeal swallowing;Reduced posterior propulsion Oral - Nectar Cup NT Oral - Nectar Straw NT Oral - Thin Cup NT Oral - Thin Straw NT Oral - Puree Weak lingual manipulation;Lingual pumping;Incomplete tongue to palate contact;Lingual/palatal residue;Delayed oral transit;Decreased bolus cohesion;Premature spillage;Holding of bolus;Piecemeal swallowing Oral - Mech Soft NT Oral - Regular NT Oral - Multi-Consistency NT Oral - Pill NT    01/10/2022   1:54 PM Pharyngeal Phase Pharyngeal Phase Impaired Pharyngeal- Nectar Teaspoon Delayed swallow initiation-pyriform sinuses;Reduced pharyngeal peristalsis;Reduced airway/laryngeal closure;Moderate aspiration;Inter-arytenoid space residue;Pharyngeal residue - cp segment;Pharyngeal residue - posterior pharnyx;Pharyngeal residue - pyriform;Pharyngeal residue - valleculae;Lateral channel residue;Penetration/Aspiration during  swallow;Reduced laryngeal elevation Pharyngeal Material enters airway, passes BELOW cords and not ejected out despite cough attempt by patient Pharyngeal- Nectar Cup NT Pharyngeal- Nectar Straw NT Pharyngeal- Thin Cup NT Pharyngeal- Thin Straw NT Pharyngeal- Puree Delayed swallow initiation-pyriform sinuses;Reduced pharyngeal peristalsis;Reduced anterior laryngeal mobility;Reduced tongue base retraction;Penetration/Apiration after swallow;Moderate aspiration;Pharyngeal residue - pyriform;Pharyngeal residue - posterior pharnyx;Pharyngeal residue - cp segment;Inter-arytenoid space residue;Lateral channel residue;Compensatory strategies attempted (with notebox) Pharyngeal Material enters airway, passes BELOW cords and not ejected out despite cough attempt by patient Pharyngeal- Mechanical Soft NT Pharyngeal- Regular NT Pharyngeal- Multi-consistency NT Pharyngeal- Pill NT    01/10/2022   1:58 PM Cervical Esophageal Phase  Cervical Esophageal Phase Impaired Nectar Teaspoon Reduced cricopharyngeal relaxation Nectar Cup NT Nectar Straw NT Puree Esophageal backflow into the pharynx;Reduced cricopharyngeal relaxation Mechanical Soft NT Regular NT Multi-consistency NT Pill NT Cervical Esophageal Comment Decreased relaxation of the UES observed with significant pyriform sinus residuals Bethany A Lutes 01/10/2022, 9:06 PM                     DG Abd Portable 1V  Result Date: 01/10/2022 CLINICAL DATA:  Feeding tube placement. EXAM: PORTABLE ABDOMEN - 1 VIEW COMPARISON:  None Available. FINDINGS: Feeding tube tip is at the level of the gastric antrum. Small rounded densities are seen in the region of the colon likely related to diverticula. There is a heterogeneous rounded density projecting over the right upper quadrant measuring 3.0 x 3.0 cm. This is indeterminate. Lung bases are clear. There is mild curvature of the lumbar spine with degenerative change. IMPRESSION: 1. Feeding tube tip is at the level of the gastric antrum. 2.  Scattered colonic diverticula. 3. 3 cm heterogeneous density in the right upper  quadrant, indeterminate. Findings may represent a large gallstone, renal calcification or other calcified mass. Please correlate clinically. This can be further evaluated with CT. Electronically Signed   By: Ronney Asters M.D.   On: 01/10/2022 15:07   No results for input(s): "WBC", "HGB", "HCT", "PLT" in the last 72 hours.  Recent Labs    01/12/22 0522  NA 146*  K 3.3*  CL 116*  CO2 22  GLUCOSE 134*  BUN 30*  CREATININE 1.30*  CALCIUM 8.3*    Intake/Output Summary (Last 24 hours) at 01/12/2022 0911 Last data filed at 01/12/2022 0900 Gross per 24 hour  Intake 1282.42 ml  Output --  Net 1282.42 ml        Physical Exam: Vital Signs Blood pressure 140/60, pulse 71, temperature 99.1 F (37.3 C), temperature source Oral, resp. rate 18, height 5\' 1"  (1.549 m), weight 56.2 kg, SpO2 98 %.  Constitutional: No distress . Vital signs reviewed. HEENT: NCAT, EOMI, oral membranes moist, NGT Neck: supple Cardiovascular: RRR without murmur. No JVD    Respiratory/Chest: CTA Bilaterally without wheezes or rales. Normal effort    GI/Abdomen: BS +, non-tender, non-distended Ext: no clubbing, cyanosis, or edema Psych: pleasant and cooperative  Skin: No evidence of breakdown, no evidence of rash Neurologic: Cranial nerves II through XII intact 0/5 LUE and LLE--no changes Reduced sensation left side  alert, +dysarthria.  Can follow simple commands.  L inattention.   Assessment/Plan: 1. Functional deficits which require 3+ hours per day of interdisciplinary therapy in a comprehensive inpatient rehab setting. Physiatrist is providing close team supervision and 24 hour management of active medical problems listed below. Physiatrist and rehab team continue to assess barriers to discharge/monitor patient progress toward functional and medical goals  Care Tool:  Bathing    Body parts bathed by patient: Face, Left  arm, Right upper leg   Body parts bathed by helper: Right arm, Chest, Abdomen, Left upper leg, Right lower leg, Left lower leg     Bathing assist Assist Level: Total Assistance - Patient < 25%     Upper Body Dressing/Undressing Upper body dressing   What is the patient wearing?: Hospital gown only    Upper body assist Assist Level: Total Assistance - Patient < 25%    Lower Body Dressing/Undressing Lower body dressing      What is the patient wearing?: Incontinence brief     Lower body assist Assist for lower body dressing: 2 Helpers     Toileting Toileting    Toileting assist Assist for toileting: 2 Helpers     Transfers Chair/bed transfer  Transfers assist  Chair/bed transfer activity did not occur: Safety/medical concerns  Chair/bed transfer assist level: 2 Helpers     Locomotion Ambulation   Ambulation assist   Ambulation activity did not occur: Safety/medical concerns          Walk 10 feet activity   Assist  Walk 10 feet activity did not occur: Safety/medical concerns        Walk 50 feet activity   Assist Walk 50 feet with 2 turns activity did not occur: Safety/medical concerns         Walk 150 feet activity   Assist Walk 150 feet activity did not occur: Safety/medical concerns         Walk 10 feet on uneven surface  activity   Assist Walk 10 feet on uneven surfaces activity did not occur: Safety/medical concerns         Wheelchair  Assist Is the patient using a wheelchair?: Yes Type of Wheelchair: Manual Wheelchair activity did not occur: Safety/medical concerns         Wheelchair 50 feet with 2 turns activity    Assist    Wheelchair 50 feet with 2 turns activity did not occur: Safety/medical concerns       Wheelchair 150 feet activity     Assist  Wheelchair 150 feet activity did not occur: Safety/medical concerns       Blood pressure 140/60, pulse 71, temperature 99.1 F (37.3 C),  temperature source Oral, resp. rate 18, height 5\' 1"  (1.549 m), weight 56.2 kg, SpO2 98 %.  Medical Problem List and Plan: 1. Functional deficits secondary to right MCA scattered infarct due to right M2 occlusion, secondary to large vessel disease from high-grade right ICA stenosis status post right carotid angioplasty 5/31             -patient may  shower             -ELOS/Goals: 01/31/22 min to mod A SLP and min A PT/OT-  --Continue CIR therapies including PT, OT, and SLP     2.  Antithrombotics: -DVT/anticoagulation:  Pharmaceutical: Lovenox             -antiplatelet therapy: Aspirin 81 mg daily and Brilinta 90 mg twice daily   3. Pain Management: Hydrocodone as needed 6/11 No issues with pain  today 4. Mood: Provide emotional support             -antipsychotic agents: N/A 5. Neuropsych: This patient is capable of making decisions on his own behalf. 6. Skin/Wound Care: Routine skin checks 7. Fluids/Electrolytes/Nutrition: Routine in and outs with follow-up chemistry 8.  Dysphagia.  Pt is NPO on TF  -sponge off tongue 9.  Hypertension.  Norvasc 5 mg daily, Tenormin 100 mg daily.  Monitor with increased mobility -Continue to monitor, permissive HTN   Norvasc 5 mg daily, 100 mg Tenormin daily.Also on flomax      01/12/2022    4:59 AM 01/12/2022    3:27 AM 01/11/2022    7:28 PM  Vitals with BMI  Weight 123 lbs 14 oz    BMI 61.44    Systolic  315 400  Diastolic  60 53  Pulse  71 68  Controlled 6/11 10.  Hyperlipidemia.  Crestor 11.  BPH.  Flomax 0.4 mg daily.  hold for hypotension    12.  CKD stage III.      Latest Ref Rng & Units 01/12/2022    5:22 AM 01/09/2022    5:28 AM 01/04/2022    6:35 AM  BMP  Glucose 70 - 99 mg/dL 134   109   BUN 8 - 23 mg/dL 30   29   Creatinine 0.61 - 1.24 mg/dL 1.30  1.46  1.30   Sodium 135 - 145 mmol/L 146   150   Potassium 3.5 - 5.1 mmol/L 3.3   3.5   Chloride 98 - 111 mmol/L 116   121   CO2 22 - 32 mmol/L 22   23   Calcium 8.9 - 10.3 mg/dL  8.3   8.7   Na+ , Cl- elevated , some fluid contraction , s/p .45 NaCL bolus  6/11 oral membranes very dry likely d/t open mouth/ decreased oral-motor control. Labs stable today 13. Constipation- will give Sorbitol if no BM  6/9 type 5 BM --add senokot-s to bowel regimen  14, Dispo- family member can stay overnight  15. Hypernatremia mild, encourage fluid intake, repeat labs ordered 16. Hypoalbuminemia,   17.  Tachycardia,but mildly bradycardic on EKG, rate 61, NSR  -controlled 6/11  LOS: 10 days A FACE TO [place] 01/12/2022, 9:11 AM

## 2022-01-12 NOTE — Progress Notes (Signed)
Speech Language Pathology Daily Session Note  Patient Details  Name: ADAL SERENO MRN: [id number] Date of Birth: 05-04-1932  Today's Date: 01/12/2022 SLP Individual Time: 1300-1345 SLP Individual Time Calculation (min): 45 min  Short Term Goals: Week 2: SLP Short Term Goal 1 (Week 2): Pt will participate in pharyngeal strengthening exercises with good effort and accuracy given Mod A. SLP Short Term Goal 2 (Week 2): Pt will participate in controlled PO trials of ice chips with minimal s/sx concerning for aspiration to assess readiness for repeat instrumental swallow study. SLP Short Term Goal 3 (Week 2): Pt will solve basic problems during functional tasks provided mod A cues SLP Short Term Goal 4 (Week 2): Pt will utilize speech intelligibility strategies as trained provided max A cues to achieve 70% intelligibility at phrase level SLP Short Term Goal 5 (Week 2): Pt will attend to L visual field on 50% of trials given Max A.  Skilled Therapeutic Interventions:  Pt was seen for skilled ST targeting goals for cognition and dysphagia.  Upon arrival, pt was sitting upright in chair asking for therapist to call his daughter because he "didn't want to be here anymore."  Spoke with nursing who reports that pt had called his daughter earlier today and that daughter typically visits in the afternoon.  After reminding pt that he'd already spoken with his daughter and that his daughter visits regularly, he was more easily redirected; however, as session progressed pt became increasingly fatigued.  Pt was noted to have thick secretions at the corners of his mouth which were removed with oral care.  Pt initially appeared more alert following oral care; however, when attempting trials of ice chips pt had no awareness of the bolus and as a result would not accept boluses orally.  Attempts to improve bolus awareness such as rubbing ice chip on pt's lips for moisture were ineffective and trials were stopped for  pt safety.  Pt was transferred back to bed with +2 assist from nursing and [place] lift.  Pt was left in bed with bed alarm set.  Continue per current plan of care.     Pain Pain Assessment Pain Scale: 0-10 Pain Score: 0-No pain  Therapy/Group: Individual Therapy  Davyon Fisch, Selinda Orion 01/12/2022, 4:43 PM

## 2022-01-13 ENCOUNTER — Encounter (HOSPITAL_COMMUNITY): Payer: Self-pay

## 2022-01-13 LAB — GLUCOSE, CAPILLARY
Glucose-Capillary: 121 mg/dL — ABNORMAL HIGH (ref 70–99)
Glucose-Capillary: 134 mg/dL — ABNORMAL HIGH (ref 70–99)
Glucose-Capillary: 141 mg/dL — ABNORMAL HIGH (ref 70–99)
Glucose-Capillary: 143 mg/dL — ABNORMAL HIGH (ref 70–99)
Glucose-Capillary: 143 mg/dL — ABNORMAL HIGH (ref 70–99)
Glucose-Capillary: 148 mg/dL — ABNORMAL HIGH (ref 70–99)

## 2022-01-13 MED ORDER — GUAIFENESIN 100 MG/5ML PO LIQD
15.0000 mL | Freq: Four times a day (QID) | ORAL | Status: DC | PRN
  Administered 2022-01-13 – 2022-01-15 (×3): 15 mL
  Filled 2022-01-13 (×3): qty 15

## 2022-01-13 MED ORDER — GUAIFENESIN ER 600 MG PO TB12: 600.0000 mg | ORAL_TABLET | Freq: Two times a day (BID) | ORAL | Status: DC | PRN

## 2022-01-13 MED ORDER — DM-GUAIFENESIN ER 30-600 MG PO TB12
1.0000 | ORAL_TABLET | Freq: Two times a day (BID) | ORAL | Status: DC
  Filled 2022-01-13: qty 1

## 2022-01-13 MED ORDER — POTASSIUM CHLORIDE 20 MEQ PO PACK
20.0000 meq | PACK | Freq: Every day | ORAL | Status: DC
  Administered 2022-01-13 – 2022-01-14 (×2): 20 meq via ORAL
  Filled 2022-01-13 (×2): qty 1

## 2022-01-13 NOTE — Progress Notes (Signed)
Speech Language Pathology Daily Session Note  Patient Details  Name: Chad [name] MRN: [id number] Date of Birth: September 12, 1931  Today's Date: 01/13/2022 SLP Individual Time: 6203-5597 SLP Individual Time Calculation (min): 44 min  Short Term Goals: Week 2: SLP Short Term Goal 1 (Week 2): Pt will participate in pharyngeal strengthening exercises with good effort and accuracy given Mod A. - Pt participated in CTAR x 10,  EMST x 10 reps (at lowest setting), and breath hold/vocal fold adduction exercises x 5 with fair effort and accuracy given Max multimodal A.Benefited from frequent rest breaks. Of note, pt appeared SOB at the conclusion of today's exercises; therefore, SpO2 and HR were obtained (HR = 73, SpO2 = 100%).  SLP Short Term Goal 2 (Week 2): Pt will participate in controlled PO trials of ice chips/1/4 tsp of water with minimal s/sx concerning for aspiration to assess readiness for repeat instrumental swallow study. - Pt accepted 1/4 tsp of water trials x 3 with delayed swallow initiation requiring Max A to include tactile/hyoglossal assistance + verbal cues; wet vocal quality appreciated immediately following therapeutic thin water trials. Pt unable to implement compensatory postural change of head turned to side.  SLP Short Term Goal 3 (Week 2): Pt will solve basic problems during functional tasks provided mod A cues. - Total A to utilize call bell to request assistance for changing his brief. Participated in simple + functional verbal problem-solving re: current deficits and status with 100% accuracy given Min A.  SLP Short Term Goal 4 (Week 2): Pt will utilize speech intelligibility strategies as trained provided max A cues to achieve 70% intelligibility at phrase level. - Did not formally address this session.  SLP Short Term Goal 5 (Week 2): Pt will attend to L visual field on 50% of trials given Max A. - Attended to SLP in L visual field ~80% of the time with Sup A.  Skilled  Therapeutic Interventions: Pt seen this date for skilled ST intervention targeting deglutition and cognitive-linguistic goals outlined above. Pt received sleeping in bed with HOB at 30 degrees with TF's running. Aroused to name with wet vocal quality appreciated upon verbalization; unable to clear with volitional throat clear (unable to elicit volitional cough despite Max multimodal A). Mod A to complete oral care via suction toothbrush. Agreeable to ST intervention at bedside.   Please see above for objective data re: pt's performance on targeted goals. SLP provided ongoing verbal education re: results of MBSS completed on 01/10/2022, ramifications of aspiration, importance of oral care, ST POC, and rationale for pharyngeal strengthening exercises and RMT; pt verbalized understanding, though will continue to benefit from reinforcement of above mentioned education. Family not present this date. Continue to recommend NPO with NGT + oral care QID.  Pt left in bed with LPN present, soft call bell reviewed and within reach on R side, and all immediate needs met. Continue per current ST POC.   Pain Pt denies pain; NAD  Therapy/Group: Individual Therapy  Melisse Caetano A Apryll Hinkle 01/13/2022, 12:33 PM

## 2022-01-13 NOTE — Progress Notes (Signed)
Occupational Therapy Note  Patient Details  Name: Chad [name] MRN: [id number] Date of Birth: 10-Oct-1931  Attempted to see patient to make up missed therapy minutes from today. Spoke with family member as patient was sleeping in bed. Patient just finished with a therapy session and was not able to do much with them. He is too fatigued to completed missed therapy minutes at this time.   Ailene Ravel, OTR/L,CBIS  Supplemental OT - [facility] and Dirk Dress  01/13/2022, 2:42 PM

## 2022-01-13 NOTE — Progress Notes (Signed)
Physical Therapy Session Note  Patient Details  Name: Chad [name] MRN: [id number] Date of Birth: [birth year]  Today's Date: 01/13/2022 PT Individual Time: 0830-0930 PT Individual Time Calculation (min): 60 min   Short Term Goals: Week 2:  PT Short Term Goal 1 (Week 2): Pt will transfer to [facility] with mod assist PT Short Term Goal 2 (Week 2): Pt will tolerate standing up to 2 mintues with moderate cues for midline PT Short Term Goal 3 (Week 2): Pt ambulate 28ft with max assist +2 to force use of RLE  Skilled Therapeutic Interventions/Progress Updates: Pt presents semi-reclined in bed 2/2 feeding tube and appears to agree to therapy.  Pt requires Max A +2 for rolling to change soiled brief.  Pt rolled multiple times as continues to have BM.  Pt tranfers L sidelying to sit w/ max A and verbal cues.  Pt scooted to EOB w/ max A .  Pt sitting at EOB w/ re-positioning of head/gaze at midline but continues to rotate to Right and flex head.  Pt requires reaching outside of BOS to R (footboard) 2/2 pushing to left.  Pt maintaining seated position at EOB and then sit to stand w/ mod A as pt reaches for w/c arm rest to Right.  Pt maintaining upright stance, but begins pushing to Left, stops w/ cueing and transfers into TIS w/ max A.  PT again stretching to position head in midline position in TIS.  Pt reclined in TIS and chair alarm placed. Pt given pancake call bell and demonstrated use .  Left on pillows over lap supporting LUE.  Nursing notified of pt position and incontinence of bowel.     Therapy Documentation Precautions:  Precautions Precautions: Fall Precaution Comments: dense L hemi/neglect Restrictions Weight Bearing Restrictions: Yes General:   Vital Signs:  Pain:pt states some pain but unable to quantify or localize Pain Assessment Pain Scale: 0-10 Pain Score: 0-No pain Mobility:       Therapy/Group: Individual Therapy  Ladoris Gene 01/13/2022, 9:31 AM

## 2022-01-13 NOTE — Progress Notes (Signed)
Patient ID: Chad [name], male   DOB: 04/09/1932, 88 y.o.   MRN: [id number]  Sw reached out to [employer] with [facility] to contact patient daughter on scheduling a meeting with the hospital notary. Trice Taylor in [place] will follow up with patient daughter once notary returns from lunch.   Susa Simmonds [phone]

## 2022-01-13 NOTE — Progress Notes (Signed)
PROGRESS NOTE   Subjective/Complaints:   Pt working with PT, poor midline aware mild pushing to left, incont of liquid bowel  ROS: Limited due to cognitive/behavioral   Objective:   No results found. No results for input(s): "WBC", "HGB", "HCT", "PLT" in the last 72 hours.  Recent Labs    01/12/22 0522  NA 146*  K 3.3*  CL 116*  CO2 22  GLUCOSE 134*  BUN 30*  CREATININE 1.30*  CALCIUM 8.3*    No intake or output data in the 24 hours ending 01/13/22 0905       Physical Exam: Vital Signs Blood pressure (!) 153/68, pulse 71, temperature 98.6 F (37 C), temperature source Oral, resp. rate 18, height 5\' 1"  (1.549 m), weight 57.2 kg, SpO2 100 %.   General: No acute distress Mood and affect are appropriate Heart: Regular rate and rhythm no rubs murmurs or extra sounds Lungs: Clear to auscultation, breathing unlabored, no rales or wheezes Abdomen: Positive bowel sounds, soft nontender to palpation, nondistended Extremities: No clubbing, cyanosis, or edema  Skin: No evidence of breakdown, no evidence of rash Neurologic: Cranial nerves II through XII intact 2-/5 LUE and LLE- Reduced sensation left side  alert, +dysarthria.  Can follow simple commands.  L inattention.   Assessment/Plan: 1. Functional deficits which require 3+ hours per day of interdisciplinary therapy in a comprehensive inpatient rehab setting. Physiatrist is providing close team supervision and 24 hour management of active medical problems listed below. Physiatrist and rehab team continue to assess barriers to discharge/monitor patient progress toward functional and medical goals  Care Tool:  Bathing    Body parts bathed by patient: Face, Left arm, Right upper leg   Body parts bathed by helper: Right arm, Chest, Abdomen, Left upper leg, Right lower leg, Left lower leg     Bathing assist Assist Level: Total Assistance - Patient < 25%      Upper Body Dressing/Undressing Upper body dressing   What is the patient wearing?: Hospital gown only    Upper body assist Assist Level: Total Assistance - Patient < 25%    Lower Body Dressing/Undressing Lower body dressing      What is the patient wearing?: Incontinence brief     Lower body assist Assist for lower body dressing: 2 Helpers     Toileting Toileting    Toileting assist Assist for toileting: 2 Helpers     Transfers Chair/bed transfer  Transfers assist  Chair/bed transfer activity did not occur: Safety/medical concerns  Chair/bed transfer assist level: 2 Helpers     Locomotion Ambulation   Ambulation assist   Ambulation activity did not occur: Safety/medical concerns          Walk 10 feet activity   Assist  Walk 10 feet activity did not occur: Safety/medical concerns        Walk 50 feet activity   Assist Walk 50 feet with 2 turns activity did not occur: Safety/medical concerns         Walk 150 feet activity   Assist Walk 150 feet activity did not occur: Safety/medical concerns         Walk 10 feet on uneven surface  activity   Assist Walk 10 feet on uneven surfaces activity did not occur: Safety/medical concerns         Wheelchair     Assist Is the patient using a wheelchair?: Yes Type of Wheelchair: Manual Wheelchair activity did not occur: Safety/medical concerns         Wheelchair 50 feet with 2 turns activity    Assist    Wheelchair 50 feet with 2 turns activity did not occur: Safety/medical concerns       Wheelchair 150 feet activity     Assist  Wheelchair 150 feet activity did not occur: Safety/medical concerns       Blood pressure (!) 153/68, pulse 71, temperature 98.6 F (37 C), temperature source Oral, resp. rate 18, height 5\' 1"  (1.549 m), weight 57.2 kg, SpO2 100 %.  Medical Problem List and Plan: 1. 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  Antithrombotics: -DVT/anticoagulation:  Pharmaceutical: Lovenox             -antiplatelet therapy: Aspirin 81 mg daily and Brilinta 90 mg twice daily   3. Pain Management: Hydrocodone as needed 6/11 No issues with pain  today 4. Mood: Provide emotional support             -antipsychotic agents: N/A 5. Neuropsych: This patient is capable of making decisions on his own behalf. 6. Skin/Wound Care: Routine skin checks 7. Fluids/Electrolytes/Nutrition: Routine in and outs with follow-up chemistry 8.  Dysphagia.  Pt is NPO on TF, will hold during therapy   -hopefully will be able to advance diet after  RMT  9.  Hypertension.  Norvasc 5 mg daily, Tenormin 100 mg daily.  Monitor with increased mobility -Continue to monitor, permissive HTN   Norvasc 5 mg daily, 100 mg Tenormin daily.Also on flomax      01/13/2022    5:01 AM 01/13/2022    3:01 AM 01/12/2022    7:24 PM  Vitals with BMI  Weight 126 lbs 2 oz    BMI 76.54    Systolic  650 354  Diastolic  68 63  Pulse  71 68  Systolic elevation , cont to monitor for now 10.  Hyperlipidemia.  Crestor 11.  BPH.  Flomax 0.4 mg daily.  hold for hypotension    12.  CKD stage III.      Latest Ref Rng & Units 01/12/2022    5:22 AM 01/09/2022    5:28 AM 01/04/2022    6:35 AM  BMP  Glucose 70 - 99 mg/dL 134   109   BUN 8 - 23 mg/dL 30   29   Creatinine 0.61 - 1.24 mg/dL 1.30  1.46  1.30   Sodium 135 - 145 mmol/L 146   150   Potassium 3.5 - 5.1 mmol/L 3.3   3.5   Chloride 98 - 111 mmol/L 116   121   CO2 22 - 32 mmol/L 22   23   Calcium 8.9 - 10.3 mg/dL 8.3   8.7    13. Constipation- will give Sorbitol if  no BM  6/12 incont of liquid stool , likely TF related ,  wont need laxatives  14, Dispo- family member can stay overnight 15. Hypernatremia mild, encourage fluid intake, repeat labs ordered 16. Hypoalbuminemia,   17.  Tachycardia,resolved on tenormin   LOS: 11 days A FACE TO FACE EVALUATION WAS PERFORMED  Charlett Blake 01/13/2022, 9:05 AM

## 2022-01-13 NOTE — Progress Notes (Signed)
Occupational Therapy Session Note  Patient Details  Name: Chad [name] MRN: [id number] Date of Birth: 1932-05-11  Today's Date: 01/13/2022 OT Individual Time: 1115-1200 OT Individual Time Calculation (min): 45 min    Short Term Goals: Week 2:  OT Short Term Goal 1 (Week 2): Pt will maintain static sitting balance EOB with CGA > 5 min. OT Short Term Goal 2 (Week 2): Pt will complete >2 grooming tasks seated at sink with S. OT Short Term Goal 3 (Week 2): Pt will don shirt with mod A.  Therapy Documentation Precautions:  Precautions Precautions: Fall Precaution Comments: dense L hemi/neglect Restrictions Weight Bearing Restrictions: Yes General:  Upon OT arrival, pt seated in w/c with L lateral lean. Pt reports pain in B LE's but unable to rate. Pt agreeable to OT session. Treatment focused on positioning, dynamic sitting balance, visual perception, body awareness, and self care. Pt completes ADL tasks sinkside at the levels below. Pt requires Max verbal and tactile cues for sequencing and to attend to L side during session. Pt with decreased activity tolerance requiring rest breaks. Pt with min ability to recognize L lateral lean during session but was able to correct himself occasionally. Increased time required to perform ADL tasks. Max A required to [employer] [facility] [employer]. Pt sits in w/c to reach in all planes using the R UE to improve core strength and attention to L side. Pt requires increased effort and verbal to weight shift. Pt attempts to use the L UE but was unable. Pt was repositioned in w/c and left with all safety measures in place.   ADL: ADL Grooming: Maximal cueing, Minimal assistance (to wash face) Where Assessed-Grooming: Sitting at sink Upper Body Bathing: Maximal assistance Where Assessed-Upper Body Bathing: Sitting at sink Upper Body Dressing: Unable to assess (gown) Where Assessed-Upper Body Dressing: Sitting at sink   Therapy/Group: Individual Therapy  Marvetta Gibbons 01/13/2022, 12:18 PM

## 2022-01-13 NOTE — Plan of Care (Signed)
  Problem: Consults Goal: RH STROKE PATIENT EDUCATION Description: See Patient Education module for education specifics  Outcome: Progressing   Problem: RH BLADDER ELIMINATION Goal: RH STG MANAGE BLADDER WITH ASSISTANCE Description: STG Manage Bladder With  mod I Assistance Outcome: Progressing Goal: RH STG MANAGE BLADDER WITH MEDICATION WITH ASSISTANCE Description: STG Manage Bladder With Medication With mod I Assistance. Outcome: Progressing   Problem: RH SAFETY Goal: RH STG ADHERE TO SAFETY PRECAUTIONS W/ASSISTANCE/DEVICE Description: STG Adhere to Safety Precautions With cues Assistance/Device. Outcome: Progressing   Problem: RH KNOWLEDGE DEFICIT Goal: RH STG INCREASE KNOWLEDGE OF DIABETES Description: Patient and daughter will be able to manage DM with dietary modifications using handouts and educational resources independently Outcome: Progressing Goal: RH STG INCREASE KNOWLEDGE OF HYPERTENSION Description: Patient and daughter will be able to manage HTN with  medications and dietary modifications using handouts and educational resources independently Outcome: Progressing Goal: RH STG INCREASE KNOWLEDGE OF DYSPHAGIA/FLUID INTAKE Description: Patient and daughter will be able to manage dysphagia, medications and dietary modifications using handouts and educational resources independently Outcome: Progressing Goal: RH STG INCREASE KNOWLEGDE OF HYPERLIPIDEMIA Description: Patient and daughter will be able to manage HLD with  medications and dietary modifications using handouts and educational resources independently Outcome: Progressing Goal: RH STG INCREASE KNOWLEDGE OF STROKE PROPHYLAXIS Description: Patient and daughter will be able to manage secondary stroke risks with  medications and dietary modifications using handouts and educational resources independently Outcome: Progressing

## 2022-01-14 LAB — GLUCOSE, CAPILLARY
Glucose-Capillary: 129 mg/dL — ABNORMAL HIGH (ref 70–99)
Glucose-Capillary: 133 mg/dL — ABNORMAL HIGH (ref 70–99)
Glucose-Capillary: 136 mg/dL — ABNORMAL HIGH (ref 70–99)
Glucose-Capillary: 151 mg/dL — ABNORMAL HIGH (ref 70–99)
Glucose-Capillary: 163 mg/dL — ABNORMAL HIGH (ref 70–99)
Glucose-Capillary: 176 mg/dL — ABNORMAL HIGH (ref 70–99)

## 2022-01-14 MED ORDER — OXYMETAZOLINE HCL 0.05 % NA SOLN
1.0000 | Freq: Two times a day (BID) | NASAL | Status: DC
Start: 2022-01-14 — End: 2022-01-15
  Administered 2022-01-14: 1 via NASAL
  Filled 2022-01-14: qty 30

## 2022-01-14 NOTE — Progress Notes (Signed)
Speech Language Pathology Daily Session Note  Patient Details  Name: Chad [name] MRN: [id number] Date of Birth: [birth year]  Today's Date: 01/14/2022 SLP Individual Time: 1350-1450 SLP Individual Time Calculation (min): 60 min  Short Term Goals: Week 2: SLP Short Term Goal 1 (Week 2): Pt will participate in pharyngeal strengthening exercises with good effort and accuracy given Mod A. SLP Short Term Goal 2 (Week 2): Pt will participate in controlled PO trials of ice chips with minimal s/sx concerning for aspiration to assess readiness for repeat instrumental swallow study. SLP Short Term Goal 3 (Week 2): Pt will solve basic problems during functional tasks provided mod A cues SLP Short Term Goal 4 (Week 2): Pt will utilize speech intelligibility strategies as trained provided max A cues to achieve 70% intelligibility at phrase level SLP Short Term Goal 5 (Week 2): Pt will attend to L visual field on 50% of trials given Max A.  Skilled Therapeutic Interventions: Skilled ST treatment focused on swallowing and cognitive-linguistic goals. Pt received in Gideon wheelchair. Pt with minimal verbal expression and required extensive wait time to respond to basic biographical and Y/N questions, often not responding at all. Pt more likely to nod his head Y/N when SLP prompted with thumbs up/down gesture and expectant look as well as verbal repetition of questions. Pt provided the names of his children with mod A question cues. Speech intelligibility impacted by wet vocal quality and congestion. Pt was able to execute volitional throat clears and cough with max A multimodal cues although mostly ineffective in expectorating secretions or improving quality. Pt completed oral care with mod A using suction toothbrush. Pt accepted 1/4 tsp of water trial x1 with delayed and effortful swallow initiation requiring max A cues. Pt exhibited immediate throat clearing and wet vocal quality. Unable to implement compensatory  postural change of head turned to side. At this time pt exhibited increased fatigue with difficulty keeping eyes open; further PO trials were halted. SLP recommend continuation of NPO status with NGT. Pt left in bed with soft call bell and suction reviewed and left within reach on R side, and all immediate needs met. Continue per current ST POC.     Pain Pain Assessment Pain Scale: 0-10 Pain Score: 0-No pain Faces Pain Scale: No hurt  Therapy/Group: Individual Therapy  Patty Sermons 01/14/2022, 1:52 PM

## 2022-01-14 NOTE — Progress Notes (Signed)
PROGRESS NOTE   Subjective/Complaints:   Still NPO, wet voice   ROS: Limited due to cognitive/behavioral   Objective:   No results found. No results for input(s): "WBC", "HGB", "HCT", "PLT" in the last 72 hours.  Recent Labs    01/12/22 0522  NA 146*  K 3.3*  CL 116*  CO2 22  GLUCOSE 134*  BUN 30*  CREATININE 1.30*  CALCIUM 8.3*    No intake or output data in the 24 hours ending 01/14/22 0821       Physical Exam: Vital Signs Blood pressure (!) 156/63, pulse 79, temperature 98.6 F (37 C), resp. rate 17, height 5\' 1"  (1.549 m), weight 59.9 kg, SpO2 96 %.   General: No acute distress Mood and affect are appropriate Heart: Regular rate and rhythm no rubs murmurs or extra sounds Lungs: Clear to auscultation, breathing unlabored, no rales or wheezes Abdomen: Positive bowel sounds, soft nontender to palpation, nondistended Extremities: No clubbing, cyanosis, or edema  Skin: No evidence of breakdown, no evidence of rash Neurologic: Cranial nerves II through XII intact 2-/5 LUE and LLE- Reduced sensation left side  alert, +dysarthria.  Can follow simple commands.  L inattention.   Assessment/Plan: 1. Functional deficits which require 3+ hours per day of interdisciplinary therapy in a comprehensive inpatient rehab setting. Physiatrist is providing close team supervision and 24 hour management of active medical problems listed below. Physiatrist and rehab team continue to assess barriers to discharge/monitor patient progress toward functional and medical goals  Care Tool:  Bathing    Body parts bathed by patient: Chest, Face   Body parts bathed by helper: Right arm, Left arm, Abdomen     Bathing assist Assist Level: Maximal Assistance - Patient 24 - 49%     Upper Body Dressing/Undressing Upper body dressing   What is the patient wearing?: Hospital gown only    Upper body assist Assist Level: Maximal  Assistance - Patient 25 - 49%    Lower Body Dressing/Undressing Lower body dressing      What is the patient wearing?: Incontinence brief     Lower body assist Assist for lower body dressing: 2 Helpers     Toileting Toileting    Toileting assist Assist for toileting: 2 Helpers     Transfers Chair/bed transfer  Transfers assist  Chair/bed transfer activity did not occur: Safety/medical concerns  Chair/bed transfer assist level: Maximal Assistance - Patient 25 - 49%     Locomotion Ambulation   Ambulation assist   Ambulation activity did not occur: Safety/medical concerns          Walk 10 feet activity   Assist  Walk 10 feet activity did not occur: Safety/medical concerns        Walk 50 feet activity   Assist Walk 50 feet with 2 turns activity did not occur: Safety/medical concerns         Walk 150 feet activity   Assist Walk 150 feet activity did not occur: Safety/medical concerns         Walk 10 feet on uneven surface  activity   Assist Walk 10 feet on uneven surfaces activity did not occur: Safety/medical concerns  Wheelchair     Assist Is the patient using a wheelchair?: Yes Type of Wheelchair: Manual Wheelchair activity did not occur: Safety/medical concerns         Wheelchair 50 feet with 2 turns activity    Assist    Wheelchair 50 feet with 2 turns activity did not occur: Safety/medical concerns       Wheelchair 150 feet activity     Assist  Wheelchair 150 feet activity did not occur: Safety/medical concerns       Blood pressure (!) 156/63, pulse 79, temperature 98.6 F (37 C), resp. rate 17, height 5\' 1"  (1.549 m), weight 59.9 kg, SpO2 96 %.  Medical Problem List and Plan: 1. Functional deficits secondary to right MCA scattered infarct due to right M2 occlusion, secondary to large vessel disease from high-grade right ICA stenosis status post right carotid angioplasty 5/31              -patient may  shower             -ELOS/Goals: 01/31/22 min to mod A SLP and min A PT/OT- team conf in am  --Continue CIR therapies including PT, OT, and SLP     2.  Antithrombotics: -DVT/anticoagulation:  Pharmaceutical: Lovenox             -antiplatelet therapy: Aspirin 81 mg daily and Brilinta 90 mg twice daily   3. Pain Management: Hydrocodone as needed 6/11 No issues with pain  today 4. Mood: Provide emotional support             -antipsychotic agents: N/A 5. Neuropsych: This patient is capable of making decisions on his own behalf. 6. Skin/Wound Care: Routine skin checks 7. Fluids/Electrolytes/Nutrition: Routine in and outs with follow-up chemistry 8.  Dysphagia.  Pt is NPO on TF, will hold during therapy   -hopefully will be able to advance diet after  RMT  9.  Hypertension.  Norvasc 5 mg daily, Tenormin 100 mg daily.  Monitor with increased mobility -Continue to monitor, permissive HTN   Norvasc 5 mg daily, 100 mg Tenormin daily.Also on flomax      01/14/2022    5:00 AM 01/14/2022    3:51 AM 01/13/2022    8:29 PM  Vitals with BMI  Weight 132 lbs 1 oz    BMI 27.03    Systolic  500 938  Diastolic  63 75  Pulse  79 86  Systolic elevation , cont to monitor for now 10.  Hyperlipidemia.  Crestor 11.  BPH.  Flomax 0.4 mg daily.  hold for hypotension    12.  CKD stage III.      Latest Ref Rng & Units 01/12/2022    5:22 AM 01/09/2022    5:28 AM 01/04/2022    6:35 AM  BMP  Glucose 70 - 99 mg/dL 134   109   BUN 8 - 23 mg/dL 30   29   Creatinine 0.61 - 1.24 mg/dL 1.30  1.46  1.30   Sodium 135 - 145 mmol/L 146   150   Potassium 3.5 - 5.1 mmol/L 3.3   3.5   Chloride 98 - 111 mmol/L 116   121   CO2 22 - 32 mmol/L 22   23   Calcium 8.9 - 10.3 mg/dL 8.3   8.7    13. Constipation- resolved now that pt on TF, d/c senna  6/12 incont of liquid stool x 3, likely TF related , 14, Dispo- family member can stay overnight  15. Hypernatremia with hypo K+ mild, supplement K+ 24meq daily   16. Hypoalbuminemia,   17.  Tachycardia,resolved on tenormin   LOS: 12 days A FACE TO FACE EVALUATION WAS PERFORMED  Charlett Blake 01/14/2022, 8:21 AM

## 2022-01-14 NOTE — Progress Notes (Signed)
Occupational Therapy Session Note  Patient Details  Name: Chad [name] MRN: [id number] Date of Birth: Oct 29, 1931  Today's Date: 01/14/2022 OT Individual Time: 1300-1345 OT Individual Time Calculation (min): 45 min    Short Term Goals: Week 2:  OT Short Term Goal 1 (Week 2): Pt will maintain static sitting balance EOB with CGA > 5 min. OT Short Term Goal 2 (Week 2): Pt will complete >2 grooming tasks seated at sink with S. OT Short Term Goal 3 (Week 2): Pt will don shirt with mod A.  Skilled Therapeutic Interventions/Progress Updates:    Patient received sleeping in bed - difficult to arouse.  Once awake patient agreeable to getting up for OT session.  Assisted patient to roll toward left side.  Skilled OT intervention with emphasis on postural control, attention to midline, forward and rightward weight shift, alignment of LE's and  RUE to decrease pushing behavior.  In unsupported sitting worked on base of support, with feet flexed slightly behind knees.  Worked on static sittin gbalance, then gaze in midline and slightly toward left.  Worked on head orientation tipping right ear toward right shoulder.  Patient able to complete a level surface transfer toward right with moderate assistance of one person, and second helper to hold right hand to prevent pushing.  Strong management of right leg to maintain bent knee, and allow mass to flex over base prior to lift off.  Patient able to come to full stand, needed max assist for pivot, if maintained in squat - more moderate assistance.   Worked with mirror to direct midline gaze -and sustaining eyes in midline.   Patient's position in wheelchair maintained with pillows and towel roll to help maintain upright midline posture.  Patient handed off to SLP for next session.    Therapy Documentation Precautions:  Precautions Precautions: Fall Precaution Comments: dense L hemi/neglect Restrictions Weight Bearing Restrictions: No  Pain: Pain  Assessment Pain Scale: 0-10 Pain Score: 0-No pain Faces Pain Scale: No hurt     Therapy/Group: Individual Therapy  Mariah Milling 01/14/2022, 3:47 PM

## 2022-01-14 NOTE — Progress Notes (Signed)
Occupational Therapy Session Note  Patient Details  Name: Chad [name] MRN: [id number] Date of Birth: 20-Jun-1932  Today's Date: 01/14/2022 OT Individual Time: 7124-5809 OT Individual Time Calculation (min): 36 min  and Today's Date: 01/14/2022 OT Missed Time: 35 Minutes Missed Time Reason: Patient fatigue   Short Term Goals: Week 1:  OT Short Term Goal 1 (Week 1): Pt will maintain static sitting balance EOB with CGA > 5 min. OT Short Term Goal 1 - Progress (Week 1): Not met OT Short Term Goal 2 (Week 1): Pt will don shirt with mod A. OT Short Term Goal 2 - Progress (Week 1): Not met OT Short Term Goal 3 (Week 1): Pt will complete >2 grooming tasks seated at sink with S. OT Short Term Goal 3 - Progress (Week 1): Not met OT Short Term Goal 4 (Week 1): Pt will complete toilet transfer with maxA and LRAD. OT Short Term Goal 4 - Progress (Week 1): Not met OT Short Term Goal 5 (Week 1): Pt will complete toilet transfer with maxA of 2 and LRAD. Week 2:  OT Short Term Goal 1 (Week 2): Pt will maintain static sitting balance EOB with CGA > 5 min. OT Short Term Goal 2 (Week 2): Pt will complete >2 grooming tasks seated at sink with S. OT Short Term Goal 3 (Week 2): Pt will don shirt with mod A.  Skilled Therapeutic Interventions/Progress Updates:    Pt received asleep in TIS, slumped over to his L. Aroused to tactile cues/voice, initially agreeable to ADL with encouragement. Session focus on self-care retraining, activity tolerance, transfer retraining in prep for improved ADL/IADL/func mobility performance + decreased caregiver burden.  Noted wet vocal quality, total A to suction mucous secretions from oral cavity. Pt noted to be more aphonic since this therapist has last worked with him. Washed hair with fair washing cap at sink in order to utilize mirror for visual feedback. Pt fell asleep and unable to be roused.   Total A of 3 to SB transfer > bed on his L. Noted to push heavily with  RUE/RLE. Total A of 2 to return to supine and boost up. Pt did not rouse during transfer.  SatO2 at 100%, HR at 65 bpm at end of session. Pt missed 35 min of OT due to  lethargy/unable to rouse sufficiently to participate in therapy.    Pt left with HOB over 30 degrees with bed alarm engaged, call bell in reach, and all immediate needs met.    Therapy Documentation Precautions:  Precautions Precautions: Fall Precaution Comments: dense L hemi/neglect Restrictions Weight Bearing Restrictions: Yes  Pain: no s/sx throughout   ADL: See Care Tool for more details.  Therapy/Group: Individual Therapy  Volanda Napoleon MS, OTR/L  01/14/2022, 6:51 AM

## 2022-01-14 NOTE — Progress Notes (Signed)
Nurse called to room, nose running but also bleeding with one blood clot to left side. Removed blood clot and asked patient to blow his nose, however this was not successful. Called PA, received new orders.

## 2022-01-14 NOTE — Progress Notes (Signed)
Physical Therapy Session Note  Patient Details  Name: Chad [name] MRN: [id number] Date of Birth: 1932-08-01  Today's Date: 01/14/2022 PT Individual Time: 0830-0928 PT Individual Time Calculation (min): 58 min   Short Term Goals: Week 2:  PT Short Term Goal 1 (Week 2): Pt will transfer to [facility] with mod assist PT Short Term Goal 2 (Week 2): Pt will tolerate standing up to 2 mintues with moderate cues for midline PT Short Term Goal 3 (Week 2): Pt ambulate 31f with max assist +2 to force use of RLE  Skilled Therapeutic Interventions/Progress Updates:     RN at bedside administering morning Rx through tube feeds to start session. Pt shakes head 'yes' to pain so tylenol provided by RN. Pt appears agreaeble to PT tx but is very lethargic and has difficulty sustaining attention to task. Pt requiring totalA of 1 person for all mobility this session.  Retrieved disposable clothes and donned pants, compression socks, and hospital socks totalA bed level. Requires totalA (<25%) for rolling in bed with hospital bed features. He required totalA for sit>supine for BLE and trunk support. TotalA required for static sitting balance unsupported due to pushing L and posteriorly. With RUE support to bed rail, able to sustain sitting balance with maxA.   Stand<>pivot transfer to TIS w/c with totalA from EOB, towards his R side, pt pushing L and minimal response to verbal cueing for correcting. Pt very poorly positioned in w/c due to decreased midline awareness and persistent pushing L. Donned t-shirt with totalA, despite max cues for sequencing with hemi technique. Required ++ pillows for positioning in w/c, cervical lateral flexion L and trunk lean L.   Transported to main rehab gym and completed totalA squat<>pivot transfer to mat table. Used mirror for visual feedback to work on sitting balance and postural awareness/control. Able to sit EOM for >10 minutes but required totalA throughout, persistent pushing and  minimal response to cueing. Very delayed righting and protective responses.  TotalA squat<>pivot to return to TIS w/c and positioned with pillows. Reclined in TIS for safety and concluded session in chair with all needs met. Safety belt alarm on. Soft call bell in reach. RN notified of pt's position and for her to resume tube feeds.    Therapy Documentation Precautions:  Precautions Precautions: Fall Precaution Comments: dense L hemi/neglect Restrictions Weight Bearing Restrictions: Yes General:    Therapy/Group: Individual Therapy  CAlger Simons6/13/2023, 7:41 AM

## 2022-01-15 ENCOUNTER — Inpatient Hospital Stay (HOSPITAL_COMMUNITY): Payer: Medicare Other

## 2022-01-15 ENCOUNTER — Inpatient Hospital Stay (HOSPITAL_COMMUNITY)
Admission: AD | Admit: 2022-01-15 | Discharge: 2022-02-01 | DRG: 870 | Disposition: E | Payer: Medicare Other | Source: Intra-hospital | Attending: Internal Medicine | Admitting: Internal Medicine

## 2022-01-15 ENCOUNTER — Encounter (HOSPITAL_COMMUNITY): Payer: Self-pay | Admitting: Internal Medicine

## 2022-01-15 DIAGNOSIS — H9190 Unspecified hearing loss, unspecified ear: Secondary | ICD-10-CM | POA: Diagnosis present

## 2022-01-15 DIAGNOSIS — Z4682 Encounter for fitting and adjustment of non-vascular catheter: Secondary | ICD-10-CM | POA: Diagnosis not present

## 2022-01-15 DIAGNOSIS — R64 Cachexia: Secondary | ICD-10-CM | POA: Diagnosis present

## 2022-01-15 DIAGNOSIS — Z79899 Other long term (current) drug therapy: Secondary | ICD-10-CM

## 2022-01-15 DIAGNOSIS — Z515 Encounter for palliative care: Secondary | ICD-10-CM

## 2022-01-15 DIAGNOSIS — J9601 Acute respiratory failure with hypoxia: Secondary | ICD-10-CM | POA: Diagnosis not present

## 2022-01-15 DIAGNOSIS — J90 Pleural effusion, not elsewhere classified: Secondary | ICD-10-CM | POA: Diagnosis not present

## 2022-01-15 DIAGNOSIS — I6521 Occlusion and stenosis of right carotid artery: Secondary | ICD-10-CM | POA: Diagnosis not present

## 2022-01-15 DIAGNOSIS — I639 Cerebral infarction, unspecified: Secondary | ICD-10-CM | POA: Diagnosis present

## 2022-01-15 DIAGNOSIS — K224 Dyskinesia of esophagus: Secondary | ICD-10-CM | POA: Diagnosis present

## 2022-01-15 DIAGNOSIS — R001 Bradycardia, unspecified: Secondary | ICD-10-CM | POA: Diagnosis present

## 2022-01-15 DIAGNOSIS — K3189 Other diseases of stomach and duodenum: Secondary | ICD-10-CM | POA: Diagnosis not present

## 2022-01-15 DIAGNOSIS — Z7189 Other specified counseling: Secondary | ICD-10-CM | POA: Diagnosis not present

## 2022-01-15 DIAGNOSIS — I63231 Cerebral infarction due to unspecified occlusion or stenosis of right carotid arteries: Secondary | ICD-10-CM | POA: Diagnosis not present

## 2022-01-15 DIAGNOSIS — R0682 Tachypnea, not elsewhere classified: Secondary | ICD-10-CM | POA: Diagnosis not present

## 2022-01-15 DIAGNOSIS — I63511 Cerebral infarction due to unspecified occlusion or stenosis of right middle cerebral artery: Secondary | ICD-10-CM | POA: Diagnosis not present

## 2022-01-15 DIAGNOSIS — R6521 Severe sepsis with septic shock: Secondary | ICD-10-CM | POA: Diagnosis present

## 2022-01-15 DIAGNOSIS — E1122 Type 2 diabetes mellitus with diabetic chronic kidney disease: Secondary | ICD-10-CM | POA: Diagnosis present

## 2022-01-15 DIAGNOSIS — I6381 Other cerebral infarction due to occlusion or stenosis of small artery: Secondary | ICD-10-CM | POA: Diagnosis not present

## 2022-01-15 DIAGNOSIS — J690 Pneumonitis due to inhalation of food and vomit: Secondary | ICD-10-CM | POA: Diagnosis not present

## 2022-01-15 DIAGNOSIS — Z66 Do not resuscitate: Secondary | ICD-10-CM | POA: Diagnosis not present

## 2022-01-15 DIAGNOSIS — I5032 Chronic diastolic (congestive) heart failure: Secondary | ICD-10-CM | POA: Diagnosis present

## 2022-01-15 DIAGNOSIS — R0603 Acute respiratory distress: Secondary | ICD-10-CM | POA: Diagnosis not present

## 2022-01-15 DIAGNOSIS — Z7982 Long term (current) use of aspirin: Secondary | ICD-10-CM

## 2022-01-15 DIAGNOSIS — N2889 Other specified disorders of kidney and ureter: Secondary | ICD-10-CM | POA: Diagnosis not present

## 2022-01-15 DIAGNOSIS — R569 Unspecified convulsions: Secondary | ICD-10-CM | POA: Diagnosis not present

## 2022-01-15 DIAGNOSIS — R040 Epistaxis: Secondary | ICD-10-CM | POA: Diagnosis not present

## 2022-01-15 DIAGNOSIS — I69391 Dysphagia following cerebral infarction: Secondary | ICD-10-CM

## 2022-01-15 DIAGNOSIS — E8809 Other disorders of plasma-protein metabolism, not elsewhere classified: Secondary | ICD-10-CM | POA: Diagnosis present

## 2022-01-15 DIAGNOSIS — R6520 Severe sepsis without septic shock: Secondary | ICD-10-CM | POA: Diagnosis not present

## 2022-01-15 DIAGNOSIS — I10 Essential (primary) hypertension: Secondary | ICD-10-CM | POA: Diagnosis present

## 2022-01-15 DIAGNOSIS — D6489 Other specified anemias: Secondary | ICD-10-CM | POA: Diagnosis present

## 2022-01-15 DIAGNOSIS — E785 Hyperlipidemia, unspecified: Secondary | ICD-10-CM | POA: Diagnosis present

## 2022-01-15 DIAGNOSIS — R338 Other retention of urine: Secondary | ICD-10-CM | POA: Diagnosis present

## 2022-01-15 DIAGNOSIS — K317 Polyp of stomach and duodenum: Secondary | ICD-10-CM | POA: Diagnosis present

## 2022-01-15 DIAGNOSIS — N401 Enlarged prostate with lower urinary tract symptoms: Secondary | ICD-10-CM | POA: Diagnosis present

## 2022-01-15 DIAGNOSIS — L89322 Pressure ulcer of left buttock, stage 2: Secondary | ICD-10-CM | POA: Clinically undetermined

## 2022-01-15 DIAGNOSIS — R1110 Vomiting, unspecified: Secondary | ICD-10-CM | POA: Diagnosis not present

## 2022-01-15 DIAGNOSIS — R54 Age-related physical debility: Secondary | ICD-10-CM | POA: Diagnosis present

## 2022-01-15 DIAGNOSIS — R000 Tachycardia, unspecified: Secondary | ICD-10-CM | POA: Diagnosis not present

## 2022-01-15 DIAGNOSIS — N1831 Chronic kidney disease, stage 3a: Secondary | ICD-10-CM | POA: Diagnosis present

## 2022-01-15 DIAGNOSIS — N179 Acute kidney failure, unspecified: Secondary | ICD-10-CM | POA: Diagnosis not present

## 2022-01-15 DIAGNOSIS — E875 Hyperkalemia: Secondary | ICD-10-CM | POA: Diagnosis not present

## 2022-01-15 DIAGNOSIS — Q8909 Congenital malformations of spleen: Secondary | ICD-10-CM | POA: Diagnosis not present

## 2022-01-15 DIAGNOSIS — R918 Other nonspecific abnormal finding of lung field: Secondary | ICD-10-CM | POA: Diagnosis not present

## 2022-01-15 DIAGNOSIS — N170 Acute kidney failure with tubular necrosis: Secondary | ICD-10-CM | POA: Diagnosis present

## 2022-01-15 DIAGNOSIS — Z6823 Body mass index (BMI) 23.0-23.9, adult: Secondary | ICD-10-CM | POA: Diagnosis not present

## 2022-01-15 DIAGNOSIS — J9690 Respiratory failure, unspecified, unspecified whether with hypoxia or hypercapnia: Secondary | ICD-10-CM | POA: Diagnosis not present

## 2022-01-15 DIAGNOSIS — I130 Hypertensive heart and chronic kidney disease with heart failure and stage 1 through stage 4 chronic kidney disease, or unspecified chronic kidney disease: Secondary | ICD-10-CM | POA: Diagnosis present

## 2022-01-15 DIAGNOSIS — A419 Sepsis, unspecified organism: Secondary | ICD-10-CM | POA: Diagnosis not present

## 2022-01-15 DIAGNOSIS — G894 Chronic pain syndrome: Secondary | ICD-10-CM | POA: Diagnosis present

## 2022-01-15 DIAGNOSIS — I517 Cardiomegaly: Secondary | ICD-10-CM | POA: Diagnosis not present

## 2022-01-15 DIAGNOSIS — D62 Acute posthemorrhagic anemia: Secondary | ICD-10-CM | POA: Diagnosis present

## 2022-01-15 DIAGNOSIS — E870 Hyperosmolality and hypernatremia: Secondary | ICD-10-CM | POA: Diagnosis not present

## 2022-01-15 DIAGNOSIS — T18128A Food in esophagus causing other injury, initial encounter: Secondary | ICD-10-CM | POA: Diagnosis not present

## 2022-01-15 DIAGNOSIS — R109 Unspecified abdominal pain: Secondary | ICD-10-CM | POA: Diagnosis not present

## 2022-01-15 DIAGNOSIS — Z96653 Presence of artificial knee joint, bilateral: Secondary | ICD-10-CM | POA: Diagnosis present

## 2022-01-15 DIAGNOSIS — K5900 Constipation, unspecified: Secondary | ICD-10-CM | POA: Diagnosis present

## 2022-01-15 DIAGNOSIS — E876 Hypokalemia: Secondary | ICD-10-CM | POA: Diagnosis not present

## 2022-01-15 DIAGNOSIS — N320 Bladder-neck obstruction: Secondary | ICD-10-CM | POA: Diagnosis present

## 2022-01-15 DIAGNOSIS — J9600 Acute respiratory failure, unspecified whether with hypoxia or hypercapnia: Secondary | ICD-10-CM | POA: Diagnosis not present

## 2022-01-15 DIAGNOSIS — E039 Hypothyroidism, unspecified: Secondary | ICD-10-CM | POA: Diagnosis present

## 2022-01-15 DIAGNOSIS — E440 Moderate protein-calorie malnutrition: Secondary | ICD-10-CM | POA: Diagnosis not present

## 2022-01-15 DIAGNOSIS — K921 Melena: Secondary | ICD-10-CM | POA: Diagnosis present

## 2022-01-15 DIAGNOSIS — R0602 Shortness of breath: Secondary | ICD-10-CM | POA: Diagnosis not present

## 2022-01-15 DIAGNOSIS — I6930 Unspecified sequelae of cerebral infarction: Secondary | ICD-10-CM | POA: Diagnosis not present

## 2022-01-15 DIAGNOSIS — G8194 Hemiplegia, unspecified affecting left nondominant side: Secondary | ICD-10-CM | POA: Diagnosis not present

## 2022-01-15 DIAGNOSIS — R9082 White matter disease, unspecified: Secondary | ICD-10-CM | POA: Diagnosis present

## 2022-01-15 DIAGNOSIS — Z7902 Long term (current) use of antithrombotics/antiplatelets: Secondary | ICD-10-CM

## 2022-01-15 DIAGNOSIS — I69354 Hemiplegia and hemiparesis following cerebral infarction affecting left non-dominant side: Secondary | ICD-10-CM

## 2022-01-15 DIAGNOSIS — R112 Nausea with vomiting, unspecified: Secondary | ICD-10-CM | POA: Diagnosis not present

## 2022-01-15 LAB — CBC WITH DIFFERENTIAL/PLATELET
Abs Immature Granulocytes: 0 10*3/uL (ref 0.00–0.07)
Band Neutrophils: 2 %
Basophils Absolute: 0 10*3/uL (ref 0.0–0.1)
Basophils Relative: 0 %
Eosinophils Absolute: 0 10*3/uL (ref 0.0–0.5)
Eosinophils Relative: 0 %
HCT: 24.4 % — ABNORMAL LOW (ref 39.0–52.0)
Hemoglobin: 8 g/dL — ABNORMAL LOW (ref 13.0–17.0)
Lymphocytes Relative: 9 %
Lymphs Abs: 1.3 10*3/uL (ref 0.7–4.0)
MCH: 27.6 pg (ref 26.0–34.0)
MCHC: 32.8 g/dL (ref 30.0–36.0)
MCV: 84.1 fL (ref 80.0–100.0)
Monocytes Absolute: 3 10*3/uL — ABNORMAL HIGH (ref 0.1–1.0)
Monocytes Relative: 21 %
Neutro Abs: 9.9 10*3/uL — ABNORMAL HIGH (ref 1.7–7.7)
Neutrophils Relative %: 68 %
Platelets: 298 10*3/uL (ref 150–400)
RBC: 2.9 MIL/uL — ABNORMAL LOW (ref 4.22–5.81)
RDW: 17.1 % — ABNORMAL HIGH (ref 11.5–15.5)
WBC: 14.2 10*3/uL — ABNORMAL HIGH (ref 4.0–10.5)
nRBC: 0 % (ref 0.0–0.2)
nRBC: 0 /100 WBC

## 2022-01-15 LAB — BLOOD GAS, ARTERIAL
Acid-base deficit: 0.9 mmol/L (ref 0.0–2.0)
Bicarbonate: 20.4 mmol/L (ref 20.0–28.0)
Drawn by: 59156
O2 Saturation: 89 %
Patient temperature: 37.1
pCO2 arterial: 25 mmHg — ABNORMAL LOW (ref 32–48)
pH, Arterial: 7.52 — ABNORMAL HIGH (ref 7.35–7.45)
pO2, Arterial: 53 mmHg — ABNORMAL LOW (ref 83–108)

## 2022-01-15 LAB — GLUCOSE, CAPILLARY
Glucose-Capillary: 100 mg/dL — ABNORMAL HIGH (ref 70–99)
Glucose-Capillary: 137 mg/dL — ABNORMAL HIGH (ref 70–99)
Glucose-Capillary: 147 mg/dL — ABNORMAL HIGH (ref 70–99)
Glucose-Capillary: 166 mg/dL — ABNORMAL HIGH (ref 70–99)
Glucose-Capillary: 190 mg/dL — ABNORMAL HIGH (ref 70–99)
Glucose-Capillary: 92 mg/dL (ref 70–99)
Glucose-Capillary: 96 mg/dL (ref 70–99)

## 2022-01-15 LAB — BRAIN NATRIURETIC PEPTIDE: B Natriuretic Peptide: 1656.7 pg/mL — ABNORMAL HIGH (ref 0.0–100.0)

## 2022-01-15 LAB — HEPATIC FUNCTION PANEL
ALT: 15 U/L (ref 0–44)
AST: 27 U/L (ref 15–41)
Albumin: 2.4 g/dL — ABNORMAL LOW (ref 3.5–5.0)
Alkaline Phosphatase: 58 U/L (ref 38–126)
Bilirubin, Direct: <0.1 mg/dL (ref 0.0–0.2)
Total Bilirubin: 0.6 mg/dL (ref 0.3–1.2)
Total Protein: 6.7 g/dL (ref 6.5–8.1)

## 2022-01-15 LAB — COMPREHENSIVE METABOLIC PANEL
ALT: 14 U/L (ref 0–44)
AST: 25 U/L (ref 15–41)
Albumin: 2.1 g/dL — ABNORMAL LOW (ref 3.5–5.0)
Alkaline Phosphatase: 53 U/L (ref 38–126)
Anion gap: 11 (ref 5–15)
BUN: 68 mg/dL — ABNORMAL HIGH (ref 8–23)
CO2: 22 mmol/L (ref 22–32)
Calcium: 8.4 mg/dL — ABNORMAL LOW (ref 8.9–10.3)
Chloride: 105 mmol/L (ref 98–111)
Creatinine, Ser: 3.86 mg/dL — ABNORMAL HIGH (ref 0.61–1.24)
GFR, Estimated: 14 mL/min — ABNORMAL LOW (ref >60)
Glucose, Bld: 169 mg/dL — ABNORMAL HIGH (ref 70–99)
Potassium: 6.3 mmol/L (ref 3.5–5.1)
Sodium: 138 mmol/L (ref 135–145)
Total Bilirubin: 0.6 mg/dL (ref 0.3–1.2)
Total Protein: 5.9 g/dL — ABNORMAL LOW (ref 6.5–8.1)

## 2022-01-15 LAB — URINALYSIS, ROUTINE W REFLEX MICROSCOPIC
Bilirubin Urine: NEGATIVE
Glucose, UA: 50 mg/dL — AB
Ketones, ur: NEGATIVE mg/dL
Nitrite: NEGATIVE
Protein, ur: NEGATIVE mg/dL
Specific Gravity, Urine: 1.009 (ref 1.005–1.030)
pH: 6 (ref 5.0–8.0)

## 2022-01-15 LAB — POCT I-STAT 7, (LYTES, BLD GAS, ICA,H+H)
Acid-Base Excess: 0 mmol/L (ref 0.0–2.0)
Bicarbonate: 23.3 mmol/L (ref 20.0–28.0)
Calcium, Ion: 1.21 mmol/L (ref 1.15–1.40)
HCT: 18 % — ABNORMAL LOW (ref 39.0–52.0)
Hemoglobin: 6.1 g/dL — CL (ref 13.0–17.0)
O2 Saturation: 98 %
Patient temperature: 98.4
Potassium: 5.7 mmol/L — ABNORMAL HIGH (ref 3.5–5.1)
Sodium: 136 mmol/L (ref 135–145)
TCO2: 24 mmol/L (ref 22–32)
pCO2 arterial: 32.2 mmHg (ref 32–48)
pH, Arterial: 7.467 — ABNORMAL HIGH (ref 7.35–7.45)
pO2, Arterial: 100 mmHg (ref 83–108)

## 2022-01-15 LAB — BASIC METABOLIC PANEL
Anion gap: 13 (ref 5–15)
BUN: 68 mg/dL — ABNORMAL HIGH (ref 8–23)
CO2: 18 mmol/L — ABNORMAL LOW (ref 22–32)
Calcium: 8.7 mg/dL — ABNORMAL LOW (ref 8.9–10.3)
Chloride: 104 mmol/L (ref 98–111)
Creatinine, Ser: 3.84 mg/dL — ABNORMAL HIGH (ref 0.61–1.24)
GFR, Estimated: 14 mL/min — ABNORMAL LOW (ref >60)
Glucose, Bld: 132 mg/dL — ABNORMAL HIGH (ref 70–99)
Potassium: 6.7 mmol/L (ref 3.5–5.1)
Sodium: 135 mmol/L (ref 135–145)

## 2022-01-15 LAB — PREPARE RBC (CROSSMATCH)

## 2022-01-15 LAB — LACTIC ACID, PLASMA
Lactic Acid, Venous: 4 mmol/L (ref 0.5–1.9)
Lactic Acid, Venous: 3.6 mmol/L (ref 0.5–1.9)

## 2022-01-15 LAB — TROPONIN I (HIGH SENSITIVITY)
Troponin I (High Sensitivity): 74 ng/L — ABNORMAL HIGH (ref <18)
Troponin I (High Sensitivity): 72 ng/L — ABNORMAL HIGH (ref <18)

## 2022-01-15 LAB — HEMOGLOBIN AND HEMATOCRIT, BLOOD
HCT: 27.2 % — ABNORMAL LOW (ref 39.0–52.0)
Hemoglobin: 9.9 g/dL — ABNORMAL LOW (ref 13.0–17.0)

## 2022-01-15 LAB — MRSA NEXT GEN BY PCR, NASAL: MRSA by PCR Next Gen: NOT DETECTED

## 2022-01-15 MED ORDER — SODIUM CHLORIDE 0.9 % IV SOLN
3.0000 g | INTRAVENOUS | Status: AC
  Administered 2022-01-15 – 2022-01-19 (×5): 3 g via INTRAVENOUS
  Filled 2022-01-15 (×3): qty 8

## 2022-01-15 MED ORDER — SODIUM ZIRCONIUM CYCLOSILICATE 10 G PO PACK
10.0000 g | PACK | Freq: Once | ORAL | Status: DC
Start: 2022-01-15 — End: 2022-01-15

## 2022-01-15 MED ORDER — ASPIRIN 81 MG PO CHEW: 81.0000 mg | CHEWABLE_TABLET | Freq: Every day | ORAL | Status: DC

## 2022-01-15 MED ORDER — CHLORHEXIDINE GLUCONATE CLOTH 2 % EX PADS
6.0000 | MEDICATED_PAD | Freq: Every day | CUTANEOUS | Status: DC
Start: 2022-01-15 — End: 2022-01-16
  Administered 2022-01-15 – 2022-01-16 (×2): 6 via TOPICAL

## 2022-01-15 MED ORDER — ROCURONIUM BROMIDE 50 MG/5ML IV SOLN: 80.0000 mg | Freq: Once | INTRAVENOUS | Status: AC

## 2022-01-15 MED ORDER — ASPIRIN 81 MG PO CHEW
81.0000 mg | CHEWABLE_TABLET | Freq: Every day | ORAL | Status: DC
Start: 2022-01-16 — End: 2022-01-15

## 2022-01-15 MED ORDER — ATENOLOL 100 MG PO TABS: 100.0000 mg | ORAL_TABLET | Freq: Every morning | ORAL | Status: DC

## 2022-01-15 MED ORDER — MIDAZOLAM HCL 2 MG/2ML IJ SOLN
INTRAMUSCULAR | Status: AC
  Administered 2022-01-15: 2 mg via INTRAVENOUS
  Filled 2022-01-15: qty 2

## 2022-01-15 MED ORDER — SODIUM BICARBONATE 8.4 % IV SOLN
INTRAVENOUS | Status: DC
  Filled 2022-01-15: qty 1000

## 2022-01-15 MED ORDER — LACTATED RINGERS IV BOLUS
1000.0000 mL | Freq: Once | INTRAVENOUS | Status: AC
  Administered 2022-01-15: 1000 mL via INTRAVENOUS

## 2022-01-15 MED ORDER — DEXTROSE 50 % IV SOLN
INTRAVENOUS | Status: AC
  Administered 2022-01-15: 50 mL via INTRAVENOUS
  Filled 2022-01-15: qty 50

## 2022-01-15 MED ORDER — ACETAMINOPHEN 650 MG RE SUPP: 650.0000 mg | Freq: Four times a day (QID) | RECTAL | Status: DC | PRN

## 2022-01-15 MED ORDER — LACTATED RINGERS IV BOLUS
1000.0000 mL | Freq: Once | INTRAVENOUS | Status: AC
Start: 2022-01-15 — End: 2022-01-15
  Administered 2022-01-15: 1000 mL via INTRAVENOUS

## 2022-01-15 MED ORDER — DEXTROSE 50 % IV SOLN: 50.0000 mL | Freq: Once | INTRAVENOUS | Status: AC

## 2022-01-15 MED ORDER — AMLODIPINE BESYLATE 5 MG PO TABS: 5.0000 mg | ORAL_TABLET | Freq: Every day | ORAL | Status: DC

## 2022-01-15 MED ORDER — ACETAMINOPHEN 160 MG/5ML PO SOLN
650.0000 mg | Freq: Four times a day (QID) | ORAL | Status: DC | PRN
Start: 2022-01-15 — End: 2022-01-30
  Administered 2022-01-15 – 2022-01-22 (×3): 650 mg
  Filled 2022-01-15 (×3): qty 20.3

## 2022-01-15 MED ORDER — POTASSIUM CHLORIDE 20 MEQ PO PACK: 20.0000 meq | PACK | Freq: Every day | ORAL | Status: DC

## 2022-01-15 MED ORDER — ORAL CARE MOUTH RINSE: 15.0000 mL | OROMUCOSAL | Status: DC

## 2022-01-15 MED ORDER — FENTANYL CITRATE (PF) 100 MCG/2ML IJ SOLN: 50.0000 ug | Freq: Once | INTRAMUSCULAR | Status: AC

## 2022-01-15 MED ORDER — CALCIUM GLUCONATE-NACL 1-0.675 GM/50ML-% IV SOLN
1.0000 g | Freq: Once | INTRAVENOUS | Status: AC
  Administered 2022-01-15: 1000 mg via INTRAVENOUS
  Filled 2022-01-15 (×2): qty 50

## 2022-01-15 MED ORDER — FENTANYL BOLUS VIA INFUSION
25.0000 ug | INTRAVENOUS | Status: DC | PRN
  Administered 2022-01-16: 50 ug via INTRAVENOUS
  Administered 2022-01-17 (×2): 100 ug via INTRAVENOUS
  Administered 2022-01-17: 50 ug via INTRAVENOUS

## 2022-01-15 MED ORDER — DEXTROSE 50 % IV SOLN
25.0000 mL | Freq: Once | INTRAVENOUS | Status: AC
  Administered 2022-01-15: 25 mL via INTRAVENOUS
  Filled 2022-01-15: qty 50

## 2022-01-15 MED ORDER — SODIUM ZIRCONIUM CYCLOSILICATE 10 G PO PACK
10.0000 g | PACK | Freq: Two times a day (BID) | ORAL | Status: DC
  Administered 2022-01-15: 10 g
  Filled 2022-01-15: qty 1

## 2022-01-15 MED ORDER — ATENOLOL 100 MG PO TABS
100.0000 mg | ORAL_TABLET | Freq: Every morning | ORAL | Status: DC
  Filled 2022-01-15: qty 1

## 2022-01-15 MED ORDER — ENOXAPARIN SODIUM 30 MG/0.3ML IJ SOSY
30.0000 mg | PREFILLED_SYRINGE | INTRAMUSCULAR | Status: DC
  Filled 2022-01-15: qty 0.3

## 2022-01-15 MED ORDER — LACTATED RINGERS IV SOLN: INTRAVENOUS | Status: DC

## 2022-01-15 MED ORDER — ETOMIDATE 2 MG/ML IV SOLN: 20.0000 mg | Freq: Once | INTRAVENOUS | Status: AC

## 2022-01-15 MED ORDER — POLYETHYLENE GLYCOL 3350 17 G PO PACK
17.0000 g | PACK | Freq: Every day | ORAL | Status: DC
  Administered 2022-01-17 – 2022-01-24 (×5): 17 g
  Filled 2022-01-15 (×6): qty 1

## 2022-01-15 MED ORDER — ACETAMINOPHEN 325 MG PO TABS: 650.0000 mg | ORAL_TABLET | Freq: Four times a day (QID) | ORAL | Status: DC | PRN

## 2022-01-15 MED ORDER — FUROSEMIDE 10 MG/ML IJ SOLN: 40.0000 mg | Freq: Once | INTRAMUSCULAR | Status: DC

## 2022-01-15 MED ORDER — INSULIN ASPART 100 UNIT/ML IJ SOLN
0.0000 [IU] | INTRAMUSCULAR | Status: DC
  Administered 2022-01-17: 1 [IU] via SUBCUTANEOUS
  Administered 2022-01-18 – 2022-01-24 (×9): 2 [IU] via SUBCUTANEOUS

## 2022-01-15 MED ORDER — SODIUM ZIRCONIUM CYCLOSILICATE 10 G PO PACK
10.0000 g | PACK | Freq: Once | ORAL | Status: AC
  Administered 2022-01-15: 10 g
  Filled 2022-01-15: qty 1

## 2022-01-15 MED ORDER — LACTATED RINGERS IV BOLUS
500.0000 mL | Freq: Once | INTRAVENOUS | Status: AC
  Administered 2022-01-15: 500 mL via INTRAVENOUS

## 2022-01-15 MED ORDER — SODIUM CHLORIDE 0.9 % IV SOLN
3.0000 g | Freq: Two times a day (BID) | INTRAVENOUS | Status: DC
  Filled 2022-01-15: qty 8

## 2022-01-15 MED ORDER — TICAGRELOR 90 MG PO TABS
90.0000 mg | ORAL_TABLET | Freq: Two times a day (BID) | ORAL | Status: DC
Start: 2022-01-15 — End: 2022-01-15
  Filled 2022-01-15: qty 1

## 2022-01-15 MED ORDER — ROSUVASTATIN CALCIUM 20 MG PO TABS
20.0000 mg | ORAL_TABLET | Freq: Every day | ORAL | Status: DC
  Administered 2022-01-17 – 2022-01-26 (×9): 20 mg
  Filled 2022-01-15 (×9): qty 1

## 2022-01-15 MED ORDER — ROCURONIUM BROMIDE 10 MG/ML (PF) SYRINGE
PREFILLED_SYRINGE | INTRAVENOUS | Status: AC
  Administered 2022-01-15: 80 mg via INTRAVENOUS
  Filled 2022-01-15: qty 10

## 2022-01-15 MED ORDER — INSULIN ASPART 100 UNIT/ML IJ SOLN
10.0000 [IU] | Freq: Once | INTRAMUSCULAR | Status: AC
  Administered 2022-01-15: 10 [IU] via INTRAVENOUS

## 2022-01-15 MED ORDER — SODIUM BICARBONATE 8.4 % IV SOLN
INTRAVENOUS | Status: AC
  Filled 2022-01-15: qty 50

## 2022-01-15 MED ORDER — DOCUSATE SODIUM 50 MG/5ML PO LIQD
100.0000 mg | Freq: Two times a day (BID) | ORAL | Status: DC
  Administered 2022-01-17 – 2022-01-22 (×9): 100 mg
  Filled 2022-01-15 (×11): qty 10

## 2022-01-15 MED ORDER — SODIUM CHLORIDE 0.9% IV SOLUTION: Freq: Once | INTRAVENOUS | Status: AC

## 2022-01-15 MED ORDER — ORAL CARE MOUTH RINSE
15.0000 mL | OROMUCOSAL | Status: DC
  Administered 2022-01-15 – 2022-01-20 (×60): 15 mL via OROMUCOSAL

## 2022-01-15 MED ORDER — INSULIN ASPART 100 UNIT/ML IV SOLN
10.0000 [IU] | Freq: Once | INTRAVENOUS | Status: AC
  Administered 2022-01-15: 10 [IU] via INTRAVENOUS

## 2022-01-15 MED ORDER — PANTOPRAZOLE SODIUM 40 MG IV SOLR
40.0000 mg | Freq: Two times a day (BID) | INTRAVENOUS | Status: DC
  Administered 2022-01-15 – 2022-01-19 (×9): 40 mg via INTRAVENOUS
  Filled 2022-01-15 (×9): qty 10

## 2022-01-15 MED ORDER — ETOMIDATE 2 MG/ML IV SOLN
INTRAVENOUS | Status: AC
  Administered 2022-01-15: 20 mg via INTRAVENOUS
  Filled 2022-01-15: qty 20

## 2022-01-15 MED ORDER — ROSUVASTATIN CALCIUM 20 MG PO TABS: 20.0000 mg | ORAL_TABLET | Freq: Every day | ORAL | Status: DC

## 2022-01-15 MED ORDER — PANTOPRAZOLE SODIUM 40 MG IV SOLR: 40.0000 mg | Freq: Every day | INTRAVENOUS | Status: DC

## 2022-01-15 MED ORDER — HEPARIN SODIUM (PORCINE) 5000 UNIT/ML IJ SOLN
5000.0000 [IU] | Freq: Three times a day (TID) | INTRAMUSCULAR | Status: DC
  Filled 2022-01-15: qty 1

## 2022-01-15 MED ORDER — FENTANYL CITRATE (PF) 100 MCG/2ML IJ SOLN
INTRAMUSCULAR | Status: AC
  Administered 2022-01-15: 50 ug via INTRAVENOUS
  Filled 2022-01-15: qty 2

## 2022-01-15 MED ORDER — TICAGRELOR 90 MG PO TABS: 90.0000 mg | ORAL_TABLET | Freq: Two times a day (BID) | ORAL | Status: DC

## 2022-01-15 MED ORDER — DEXTROSE 50 % IV SOLN: 25.0000 mL | Freq: Once | INTRAVENOUS | Status: DC

## 2022-01-15 MED ORDER — ORAL CARE MOUTH RINSE: 15.0000 mL | OROMUCOSAL | Status: DC | PRN

## 2022-01-15 MED ORDER — FENTANYL 2500MCG IN NS 250ML (10MCG/ML) PREMIX INFUSION
25.0000 ug/h | INTRAVENOUS | Status: DC
  Administered 2022-01-15: 25 ug/h via INTRAVENOUS
  Filled 2022-01-15 (×2): qty 250

## 2022-01-15 MED ORDER — SODIUM BICARBONATE 8.4 % IV SOLN
50.0000 meq | Freq: Once | INTRAVENOUS | Status: AC
Start: 2022-01-15 — End: 2022-01-15
  Administered 2022-01-15: 50 meq via INTRAVENOUS

## 2022-01-15 MED ORDER — INSULIN ASPART 100 UNIT/ML IV SOLN: 8.0000 [IU] | Freq: Once | INTRAVENOUS | Status: DC

## 2022-01-15 MED ORDER — MIDAZOLAM HCL 2 MG/2ML IJ SOLN: 2.0000 mg | Freq: Once | INTRAMUSCULAR | Status: AC

## 2022-01-15 MED ORDER — MIDAZOLAM HCL 2 MG/2ML IJ SOLN: 1.0000 mg | INTRAMUSCULAR | Status: DC | PRN

## 2022-01-15 NOTE — Progress Notes (Signed)
Received a call from Faith LPN  at 21:11 regarding Mr. Chad [name] respiratory distress. I  have read Rapid Response Nurse ,Respiratory note was reviewed. Chest X Ray was Reviewed. Dr Chad [name] note was reviewed.  Mr. Chad [name] remains tachypneic: respiratory rate 35-38 and his WOB has not improved. . I spoke with Chad [name] charge nurse, to assess patient as well. BIPAP recommended. He was on tube feeds, prior to his respiratory distress, his tube feeds have been placed on hold., at this time  Spoke with pharmacist regarding possible aspiration pneumoniae. Placed a call to Hospitalist : Spoke with Dr Chad [name], spoke with Dr Chad [name], he will assess Mr. Chad [name] with possible admission to acute care. Placed a call to [facility] LPN/George RN regarding the above.

## 2022-01-15 NOTE — Progress Notes (Addendum)
Progress note  Hb 5.6 Nurse reports large melanotic stool  Transfuse 2 units PRBC Additional LR bolus 1 lt PPI bid Discontinue aspirin, brilinta, heparin SQ Consult GI  Marshell Garfinkel MD [facility] 01/28/2022, 3:19 PM

## 2022-01-15 NOTE — Progress Notes (Addendum)
Speech Therapy Discharge Note  This patient was unable to complete the inpatient rehab program due to change in medical status ; therefore, the patient did not meet their long term goals and has been discharged from skilled SLP services at this time.The patient left the program at a Max assist level for overall cognitive functioning. The patient is currently NPO and participating in trials of ice chips with SLP only with Max A multimodal cues for use of swallowing compensatory strategies.   See CareTool for functional status details.  If the patient is able to return to inpatient rehabilitation within 3 midnights, this may be considered an interrupted stay and therapy services will resume as ordered. Modification and reinstatement of their goals will be made upon completion of therapy service reevaluations.

## 2022-01-15 NOTE — Progress Notes (Signed)
PROGRESS NOTE        PATIENT DETAILS Name: Chad [name] Age: 88 y.o. Sex: male Date of Birth: 08-Jun-1932 Admit Date: 01/06/2022 Admitting Physician Rise Patience, MD WUJ:[id number], Ivin Booty, MD  Brief Summary: Patient is a 88 y.o.  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  While at CIR-he was noted to have worsening dysphagia with signs of aspiration-and subsequently had a cortark tube inserted on 6/9 and was subsequently started on NG tube feedings.  On 6/14 a.m.-patient developed acute respiratory distress-he was started on BiPAP and transferred to [facility].   Significant events: 5/28-6/01>> acute CVA-s/p right ICA balloon angioplasty-transfer to CIR 6/09>> worsening dysphagia-signs of aspiration on SLP eval-cortrak tube placed-feeding started. 6/14>> developed respiratory distress-started on BiPAP-feedings held-transferred to [facility].  Suspected aspiration pneumonia-with AKI and hyperkalemia.  Significant studies: 6/14>> CXR: Perihilar airspace opacities. 6/14>> x-ray abdomen: Enteric tube in gastric body-mildly dilated bowel loops.  Significant microbiology data:   Procedures:   Consults: None  Subjective: Lethargic-opens eyes-on BiPAP.  Really not following a lot of commands.  Objective: Vitals: Blood pressure (!) 165/70, pulse 65, temperature 98.8 F (37.1 C), temperature source Axillary, resp. rate (!) 32, SpO2 100 %.   Exam: Gen Exam: Lethargic/sleepy-really not following commands but not in any distress. HEENT:atraumatic, normocephalic Chest: B/L clear to auscultation anteriorly CVS:S1S2 regular Abdomen:soft non tender, non distended Extremities: Mild left leg swelling Neurology: Left facial droop-left hemiparesis. Skin: no rash  Pertinent  Labs/Radiology:    Latest Ref Rng & Units 01/14/2022    7:03 AM 01/03/2022    5:38 AM 01/02/2022    5:48 AM  CBC  WBC 4.0 - 10.5 K/uL 14.2  5.9  8.3   Hemoglobin 13.0 - 17.0 g/dL 8.0  7.6  8.2   Hematocrit 39.0 - 52.0 % 24.4  23.4  24.7   Platelets 150 - 400 K/uL 298  170  154     Lab Results  Component Value Date   NA 135 01/07/2022   K 6.7 ([place]ley) 01/04/2022   CL 104 01/27/2022   CO2 18 (L) 01/14/2022      Assessment/Plan: Acute hypoxic respiratory failure-likely due to aspiration pneumonia: Clinical picture more consistent with aspiration PNA than CHF exacerbation.  He is lethargic-we will check ABG to rule out hypercarbia.  NG tube feeds on hold.  Continue Unasyn-given lethargy-probable aspiration pneumonia-on BiPAP-we will go and consult PCCM.  AKI CKD stage IIIa: Likely hemodynamically mediated-in setting of aspiration PNA-gently hydrate-hold all nephrotoxic agents.  Check UA/renal ultrasound and frequent bladder scans to ensure he is not retaining urine.  Hyperkalemia: No obvious EKG changes-starting bicarb infusion-giving insulin/D50.  Normocytic anemia: Due to acute illness-no evidence of blood loss-follow closely and transfuse if significant drop.  History of CVA with dysphagia and significant left-sided deficits: Resume antiplatelets through NG tube when able.  Chronic HFpEF: Although BNP elevated-overall clinical picture is more consistent with aspiration PNA rather than CHF exacerbation.  Hold Lasix given AKI.  We will gently hydrate and watch closely.  Recent echo with EF 55-60%.  Palliative care: Unfortunate 88 year old with probable aspiration pneumonia following a CVA with resultant acute hypoxic respiratory failure-daughter aware of tenuous overall status and potential for more aspiration.  Full code  for now-we will continue with goals of care conversation-we will go and consult palliative care.  BMI: Estimated body mass index is 24.95 kg/m as calculated from the  following:   Height as of 01/02/22: 5\' 1"  (1.549 m).   Weight as of 01/14/22: 59.9 kg.   Code status:   Code Status: Full Code   DVT Prophylaxis: enoxaparin (LOVENOX) injection 30 mg Start: 01/22/2022 0745   Family Communication: Daughter at length at bedside.   Disposition Plan: Status is: Inpatient Remains inpatient appropriate because: AKI/hyperkalemia/hypoxia from aspiration pneumonia-not stable for discharge.   Planned Discharge Destination: Not known   Diet: Diet Order     None         Antimicrobial agents: Anti-infectives (From admission, onward)    Start     Dose/Rate Route Frequency Ordered Stop   01/09/2022 0800  Ampicillin-Sulbactam (UNASYN) 3 g in sodium chloride 0.9 % 100 mL IVPB        3 g 200 mL/hr over 30 Minutes Intravenous Every 12 hours 01/08/2022 0705          MEDICATIONS: Scheduled Meds:  amLODipine  5 mg Oral Daily   aspirin  81 mg Oral Daily   atenolol  100 mg Oral q morning   insulin aspart  8 Units Intravenous Once   And   dextrose  25 mL Intravenous Once   enoxaparin (LOVENOX) injection  30 mg Subcutaneous Q24H   rosuvastatin  20 mg Oral Daily   ticagrelor  90 mg Oral BID   Continuous Infusions:  ampicillin-sulbactam (UNASYN) IV     sodium bicarbonate 150 mEq in dextrose 5 % 1,150 mL infusion     PRN Meds:.acetaminophen **OR** acetaminophen   I have personally reviewed following labs and imaging studies  LABORATORY DATA: CBC: Recent Labs  Lab 01/11/2022 0703  WBC 14.2*  NEUTROABS PENDING  HGB 8.0*  HCT 24.4*  MCV 84.1  PLT 867    Basic Metabolic Panel: Recent Labs  Lab 01/09/22 0528 01/10/22 1831 01/11/22 0515 01/11/22 1650 01/12/22 0522 01/19/2022 0703  NA  --   --   --   --  146* 135  K  --   --   --   --  3.3* 6.7*  CL  --   --   --   --  116* 104  CO2  --   --   --   --  22 18*  GLUCOSE  --   --   --   --  134* 132*  BUN  --   --   --   --  30* 68*  CREATININE 1.46*  --   --   --  1.30* 3.84*  CALCIUM  --   --    --   --  8.3* 8.7*  MG  --  2.1 2.0 1.9 1.9  --   PHOS  --  3.3 2.9 2.2* 1.8*  --     GFR: Estimated Creatinine Clearance: 9.8 mL/min (A) (by C-G formula based on SCr of 3.84 mg/dL (H)).  Liver Function Tests: Recent Labs  Lab 01/29/2022 0703  AST 27  ALT 15  ALKPHOS 58  BILITOT 0.6  PROT 6.7  ALBUMIN 2.4*   No results for input(s): "LIPASE", "AMYLASE" in the last 168 hours. No results for input(s): "AMMONIA" in the last 168 hours.  Coagulation Profile: No results for input(s): "INR", "PROTIME" in the last 168 hours.  Cardiac Enzymes: No results for input(s): "CKTOTAL", "CKMB", "CKMBINDEX", "TROPONINI" in  the last 168 hours.  BNP (last 3 results) No results for input(s): "PROBNP" in the last 8760 hours.  Lipid Profile: No results for input(s): "CHOL", "HDL", "LDLCALC", "TRIG", "CHOLHDL", "LDLDIRECT" in the last 72 hours.  Thyroid Function Tests: No results for input(s): "TSH", "T4TOTAL", "FREET4", "T3FREE", "THYROIDAB" in the last 72 hours.  Anemia Panel: No results for input(s): "VITAMINB12", "FOLATE", "FERRITIN", "TIBC", "IRON", "RETICCTPCT" in the last 72 hours.  Urine analysis:    Component Value Date/Time   COLORURINE YELLOW 12/29/2021 2233   APPEARANCEUR CLEAR 12/29/2021 2233   LABSPEC 1.011 12/29/2021 2233   PHURINE 5.0 12/29/2021 2233   GLUCOSEU NEGATIVE 12/29/2021 2233   HGBUR SMALL (A) 12/29/2021 2233   BILIRUBINUR NEGATIVE 12/29/2021 2233   KETONESUR NEGATIVE 12/29/2021 2233   PROTEINUR 30 (A) 12/29/2021 2233   UROBILINOGEN 0.2 03/23/2013 0908   NITRITE NEGATIVE 12/29/2021 2233   LEUKOCYTESUR NEGATIVE 12/29/2021 2233    Sepsis Labs: Lactic Acid, Venous    Component Value Date/Time   LATICACIDVEN 1.1 12/30/2021 0418    MICROBIOLOGY: No results found for this or any previous visit (from the past 240 hour(s)).  RADIOLOGY STUDIES/RESULTS: DG Abd 1 View  Result Date: 01/16/2022 CLINICAL DATA:  88 year old male with abdominal pain and  vomiting. EXAM: ABDOMEN - 1 VIEW COMPARISON:  01/10/2022. FINDINGS: Portable AP supine views at 0704 hours. Patient is mildly rotated to the left but the enteric tube tip is now in the gastric body. Extensive Calcified aortic atherosclerosis. Pelvic vascular calcifications. Progressive opacification of the visible left lung base, left hemidiaphragm now largely obscured. Decreased large bowel gas and borderline to mildly dilated gas-filled mid abdominal small bowel loops now. Spinal scoliosis and degeneration. No acute osseous abnormality identified. IMPRESSION: 1. Enteric tube tip now in the gastric body. 2. Decreased large bowel gas and borderline to mildly dilated mid abdominal small bowel loops. This could reflect ileus or developing small bowel obstruction. Follow-up abdominal radiographs may be valuable. 3. Progressive opacification at the left lung base since 01/10/2022, see portable chest x-ray 0313 hours today. Electronically Signed   By: Genevie Ann M.D.   On: 01/25/2022 07:37   DG Chest Port 1 View  Result Date: 01/26/2022 CLINICAL DATA:  Acute respiratory distress EXAM: PORTABLE CHEST 1 VIEW COMPARISON:  03/23/2022 FINDINGS: Cardiomegaly. Vascular congestion. Aortic atherosclerosis. Perihilar airspace opacities bilaterally. No effusions. No acute bony abnormality. IMPRESSION: Perihilar airspace opacities, likely edema although pneumonia not excluded. Electronically Signed   By: Rolm Baptise M.D.   On: 01/08/2022 03:29     LOS: 0 days   Oren Binet, MD  [facility]    To contact the attending provider between 7A-7P or the covering provider during after hours 7P-7A, please log into the web site [email or web address] and access using universal [place] password for that web site. If you do not have the password, please call the hospital operator.  01/14/2022, 8:49 AM

## 2022-01-15 NOTE — Discharge Summary (Signed)
Physician Discharge Summary  Patient ID: TAIGE HOUSMAN MRN: [id number] DOB/AGE: Dec 27, 1931 88 y.o.  Admit date: 01/16/2022 Discharge date: 01/16/2022  Discharge Diagnoses:  Principal Problem:   Acute respiratory failure with hypoxia [facility]) DVT prophylaxis Dysphagia Hypertension Hyperlipidemia BPH CKD stage III Constipation  Discharged Condition: Guarded  Significant Diagnostic Studies: DG Chest Port 1 View  Result Date: 01/16/2022 CLINICAL DATA:  Acute respiratory distress EXAM: PORTABLE CHEST 1 VIEW COMPARISON:  03/23/2022 FINDINGS: Cardiomegaly. Vascular congestion. Aortic atherosclerosis. Perihilar airspace opacities bilaterally. No effusions. No acute bony abnormality. IMPRESSION: Perihilar airspace opacities, likely edema although pneumonia not excluded. Electronically Signed   By: Rolm Baptise M.D.   On: 01/03/2022 03:29   DG Swallowing Func-Speech Pathology  Result Date: 01/10/2022 Table formatting from the original result was not included. Objective Swallowing Evaluation: Type of Study: MBS-Modified Barium Swallow Study  Patient Details Name: VAISHNAV DEMARTIN MRN: [id number] Date of Birth: 10-20-1931 Today's Date: 01/10/2022 Time: SLP Start Time (ACUTE ONLY): 1100 -SLP Stop Time (ACUTE ONLY): 1123 SLP Time Calculation (min) (ACUTE ONLY): 23 min Past Medical History: Past Medical History: Diagnosis Date  Arthritis   Heart murmur   asa child   Hypertension   Stroke [facility])  Past Surgical History: Past Surgical History: Procedure Laterality Date  EYE SURGERY Bilateral 2022  IR ANGIO INTRA EXTRACRAN SEL COM CAROTID INNOMINATE UNI R MOD SED  01/01/2022  IR CT HEAD LTD  01/01/2022  IR PTA INTRACRANIAL  01/01/2022  JOINT REPLACEMENT    RADIOLOGY WITH ANESTHESIA N/A 01/01/2022  Procedure: Angiogram;  Surgeon: Luanne Bras, MD;  Location: [place];  Service: Radiology;  Laterality: N/A;  TONSILLECTOMY    TOTAL KNEE ARTHROPLASTY Right 03/30/2013  Procedure: RIGHT TOTAL KNEE ARTHROPLASTY;  Surgeon:  Tobi Bastos, MD;  Location: [facility];  Service: Orthopedics;  Laterality: Right;  TOTAL KNEE ARTHROPLASTY Left 10/09/2020  Procedure: TOTAL KNEE ARTHROPLASTY;  Surgeon: Paralee Cancel, MD;  Location: [facility];  Service: Orthopedics;  Laterality: Left;  70 mins HPI: 88 yo male presenting to the ED on 5/28 with L sided weakness, facial droop, slurred speech, and fall. 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 PMH including hypothyroidism chronic pain syndromes, CKD, HTN, arthritis, bil TKA.  Subjective: alert, friendly  Recommendations for follow up therapy are one component of a multi-disciplinary discharge planning process, led by the attending physician.  Recommendations may be updated based on patient status, additional functional criteria and insurance authorization. Assessment / Plan / Recommendation   01/10/2022   8:50 PM Clinical Impressions Clinical Impressions SLP Visit Diagnosis MBSS completed secondary to clinical concerns for aspiration of current diet textures (puree and nectar-thick liquids). At this time, pt presents with severe oropharyngeal dysphagia, with significant UES dysfunction, which resulted in significant posterior tracheal sensed aspiration (PAS 7) of pureed texture via interarytenoid space due to overflow of pyriform sinus residuals into laryngeal vestibule. Unfortunately, pharyngeal stasis and pyriform sinus residuals could not clear due to poor UES relaxation with mild esophageal backflow to pyriform sinuses. Additionally, delayed sensation of gross aspiration of nectar-thick liquid via tsp was appreciated during the swallow. [name] cough response was not effective in pulmonary clearance. Pt required tactile, hyoglossal assistance and verbal cues, from SLP, to initiate swallow response during puree trials. It should be noted that this is a significant change from pt's MBSS results documented on 01/02/2022.   Given pt's risk severity for  development of aspiration PNA (Total A for oral care, decreased  ambulation, deconditioning) and change in swallow function, recommend initiation of NPO with temporary alternate means of nutrition, hydration, and medications at this time. May consider GI referral to assess UES function if improvement is not noted with therapy alone. [name] daughter present and results + recommendations were reviewed with pt an daughter who verbalized agreement with recommendations following education. MD, LPN, and PA notified of recommendations. Please see imaging report for full details. Dysphagia, oropharyngeal phase (R13.12);Cognitive communication deficit (R41.841) Impact on safety and function Severe aspiration risk;Risk for inadequate nutrition/hydration     01/02/2022  11:00 AM Treatment Recommendations Treatment Recommendations Therapy as outlined in treatment plan below     01/10/2022   1:59 PM Prognosis Prognosis for Safe Diet Advancement Good Barriers to Reach Goals Cognitive deficits;Severity of deficits   01/10/2022   8:50 PM Diet Recommendations SLP Diet Recommendations NPO;Alternative means - temporary Medication Administration Via alternative means Postural Changes Other (Comment)     01/10/2022   8:50 PM Other Recommendations Recommended Consults Consider GI evaluation;Consider esophageal assessment Oral Care Recommendations Oral care QID Other Recommendations Have oral suction available   01/10/2022   8:50 PM Frequency and Duration  Treatment Duration 2 weeks     01/10/2022   1:53 PM Oral Phase Oral Phase Impaired Oral - Nectar Teaspoon Weak lingual manipulation;Lingual pumping;Delayed oral transit;Decreased bolus cohesion;Premature spillage;Lingual/palatal residue;Piecemeal swallowing;Reduced posterior propulsion Oral - Nectar Cup NT Oral - Nectar Straw NT Oral - Thin Cup NT Oral - Thin Straw NT Oral - Puree Weak lingual manipulation;Lingual pumping;Incomplete tongue to palate contact;Lingual/palatal residue;Delayed oral  transit;Decreased bolus cohesion;Premature spillage;Holding of bolus;Piecemeal swallowing Oral - Mech Soft NT Oral - Regular NT Oral - Multi-Consistency NT Oral - Pill NT    01/10/2022   1:54 PM Pharyngeal Phase Pharyngeal Phase Impaired Pharyngeal- Nectar Teaspoon Delayed swallow initiation-pyriform sinuses;Reduced pharyngeal peristalsis;Reduced airway/laryngeal closure;Moderate aspiration;Inter-arytenoid space residue;Pharyngeal residue - cp segment;Pharyngeal residue - posterior pharnyx;Pharyngeal residue - pyriform;Pharyngeal residue - valleculae;Lateral channel residue;Penetration/Aspiration during swallow;Reduced laryngeal elevation Pharyngeal Material enters airway, passes BELOW cords and not ejected out despite cough attempt by patient Pharyngeal- Nectar Cup NT Pharyngeal- Nectar Straw NT Pharyngeal- Thin Cup NT Pharyngeal- Thin Straw NT Pharyngeal- Puree Delayed swallow initiation-pyriform sinuses;Reduced pharyngeal peristalsis;Reduced anterior laryngeal mobility;Reduced tongue base retraction;Penetration/Apiration after swallow;Moderate aspiration;Pharyngeal residue - pyriform;Pharyngeal residue - posterior pharnyx;Pharyngeal residue - cp segment;Inter-arytenoid space residue;Lateral channel residue;Compensatory strategies attempted (with notebox) Pharyngeal Material enters airway, passes BELOW cords and not ejected out despite cough attempt by patient Pharyngeal- Mechanical Soft NT Pharyngeal- Regular NT Pharyngeal- Multi-consistency NT Pharyngeal- Pill NT    01/10/2022   1:58 PM Cervical Esophageal Phase  Cervical Esophageal Phase Impaired Nectar Teaspoon Reduced cricopharyngeal relaxation Nectar Cup NT Nectar Straw NT Puree Esophageal backflow into the pharynx;Reduced cricopharyngeal relaxation Mechanical Soft NT Regular NT Multi-consistency NT Pill NT Cervical Esophageal Comment Decreased relaxation of the UES observed with significant pyriform sinus residuals Bethany A Lutes 01/10/2022, 9:06 PM                      DG Abd Portable 1V  Result Date: 01/10/2022 CLINICAL DATA:  Feeding tube placement. EXAM: PORTABLE ABDOMEN - 1 VIEW COMPARISON:  None Available. FINDINGS: Feeding tube tip is at the level of the gastric antrum. Small rounded densities are seen in the region of the colon likely related to diverticula. There is a heterogeneous rounded density projecting over the right upper quadrant measuring 3.0 x 3.0 cm. This is indeterminate. Lung bases are clear. There is mild  curvature of the lumbar spine with degenerative change. IMPRESSION: 1. Feeding tube tip is at the level of the gastric antrum. 2. Scattered colonic diverticula. 3. 3 cm heterogeneous density in the right upper quadrant, indeterminate. Findings may represent a large gallstone, renal calcification or other calcified mass. Please correlate clinically. This can be further evaluated with CT. Electronically Signed   By: Ronney Asters M.D.   On: 01/10/2022 15:07   IR ANGIO INTRA EXTRACRAN SEL COM CAROTID INNOMINATE UNI R MOD SED  Result Date: 01/03/2022 CLINICAL DATA:  Left-sided weakness with dysarthria. Patient with calcified embolus in the inferior division right MCA. Discovered to have high-grade stenosis of the extracranial right ICA proximally EXAM: IR ANGIO INTRA EXTRACRAN SEL COM CAROTID INNOMINATE UNI RIGHT MOD SED COMPARISON:  CT angiogram of the head and neck of Dec 30, 2021. MEDICATIONS: Heparin 3,000 units IV. Ancef 2 g IV antibiotic was administered within 1 hour of the procedure. ANESTHESIA/SEDATION: General anesthesia. CONTRAST:  Omnipaque 300 approximately 120 cc. FLUOROSCOPY TIME:  Fluoroscopy Time: 76 minutes 36) seconds (1257 mGy). COMPLICATIONS: None immediate. TECHNIQUE: Informed written consent was obtained from the patient after a thorough discussion of the procedural risks, benefits and alternatives. All questions were addressed. Maximal Sterile Barrier Technique was utilized including caps, mask, sterile gowns, sterile gloves,  sterile drape, hand hygiene and skin antiseptic. A timeout was performed prior to the initiation of the procedure. The left groin was prepped and draped in the usual sterile fashion. Thereafter using modified Seldinger technique, transfemoral access into the left common femoral artery was obtained without difficulty. Over a 0.035 inch guidewire, an 8 French 25 cm Pinnacle sheath was inserted. Through this, and also over 0.035 inch guidewire, a 120 cm 6 French Simmons 2 catheter inside of an 087 95 cm balloon guide catheter combination was advanced to the aortic arch region and selectively positioned in the proximal right common carotid artery and the innominate artery. An arteriogram was then performed centered extra cranially and intracranially. FINDINGS: The innominate arteriogram demonstrates significant tortuosity of the innominate artery in its entirety extending into the right common carotid artery with a 180 degree loop in the proximal right common carotid artery. Subclavian artery demonstrates patency with opacification of a hypoplastic right vertebral artery extra cranially. The right common carotid arteriogram demonstrates complex atherosclerotic calcified plaque at the right common carotid bifurcation extending into the bulb region of the right internal carotid artery resulting in a high-grade approximately 80-90% stenosis. More distally, the right internal carotid artery opacifies to the cranial skull base. Patency is seen of the petrous, cavernous and supraclinoid right ICA with a prominent right posterior communicating artery. Proximal portions of the right middle cerebral artery and the right anterior cerebral artery demonstrate patency. ENDOVASCULAR REVASCULARIZATION OF HIGH-GRADE STENOSIS OF THE RIGHT INTERNAL CAROTID ARTERY PROXIMALLY WITH BALLOON ANGIOPLASTY The balloon guide catheter was advanced into the proximal right common carotid artery. Through this, and also over a 0.014 inch Transend EX  soft tip micro guidewire, an 021 150 cm microcatheter was then advanced with biplane DSA roadmap to the proximal cavernous right ICA followed by the microcatheter. The micro guidewire was removed. Good aspiration obtained from the hub of the microcatheter. A gentle control arteriogram performed through the microcatheter demonstrated safe positioning of the tip of the microcatheter. This in turn was then exchanged out for a 300 cm 014 inch BMW exchange wire with a moderate J configuration. The tip of the micro guidewire was positioned at the petrous cavernous junction.  Measurements were then performed of the right internal carotid artery distal to the stenosis, and just proximal to the stenosis of the distal common carotid artery. A 5 mm x 20 mm 014 inch Viatrac balloon guide catheter was then prepped and purged retrogradely with heparinized saline infusion, and retrogradely with 50% contrast and 50% heparinized saline infusion. Using the rapid exchange technique, the balloon catheter was then advanced without difficulty and positioned with the distal and proximal markers adequate distant from the site of the severe stenosis in the proximal right internal carotid artery. Slow control inflation was then performed using micro inflation syringe device via micro tubing. Inflations were performed slowly to 9 atmospheres where it was maintained for approximately 60 seconds. Thereafter, the balloon was deflated and retrieved and removed. A control arteriogram performed through the balloon guide catheter in the proximal right common carotid artery demonstrated significantly improved caliber and flow through the angioplastied segment. More distally, free flow was noted into the distal right ICA intracranially. Over the exchange BMW micro guidewire, a 8 mm x 26 mm Wallstent stent delivery system which had been prepped with heparinized saline infusion was then advanced again using the rapid exchange technique to the proximal  right common carotid artery. Resistance was encountered to further advancement of the stent delivery apparatus. At this time the balloon guide catheter was advanced to the distal right common carotid artery. Further attempts were made to advance the stent delivery apparatus into the right internal carotid proximally. Advancement could only manage just distal to the angioplastied segment with no significant coverage of the angioplastied segment. Further attempts at advancing were met with resistance secondary to a step-off related to the calcified plaque. After multiple attempts it was decided to stop the procedure. Final control arteriogram performed through the balloon guide catheter in the right common carotid artery continued to demonstrate excellent flow through the right external carotid artery with a mild-to-moderate stenosis at its origin. The right internal carotid artery proximally demonstrated approximately 60% patency which was maintained on subsequent 20 minute arteriogram. Centered intracranially demonstrated brisk flow into the right anterior cerebral artery and the right middle cerebral distribution. The previously occluded anterior branch of the inferior division proximally due to a calcified plaque remained stable. The delayed arterial phase demonstrated retrograde opacification of the posterior 2/3 of the cortical branches in the sylvian triangle from the branches of the inferior division and the superior division, and also from the pericallosal branches. The balloon guide catheter was removed. The 8 French Pinnacle sheath in the left common femoral artery was removed with manual compression held for approximately 25 minutes with a quick clot to achieved hemostasis at this site. Distal pulses remained palpable in the dorsalis pedis arteries bilaterally unchanged. An immediate CT of the brain demonstrated no evidence of hemorrhagic complications. The patient was then gradually extubated without  difficulty. Patient was able to maintain his airway, and oxygen concentrations. He was able to move his right side spontaneously and to command. The patient was able to follow simple commands appropriately. No significant motor movement was evident in the left upper extremity, with only a flicker in the left foot toes. Patient was then transferred to the PACU and then subsequently the neuro ICU for post revascularization care. Later in the afternoon, the patient appeared more awake, alert and appropriately responsive. Had mild intermittent right gaze deviation but was able to track his eyes to the left past the midline. Otherwise, neurologically he remained stable unchanged. IMPRESSION: Status post endovascular  revascularization of symptomatic high-grade stenosis of the proximal right ICA with a 5 mm x 20 mm Viatrac 14 balloon angioplasty catheter achieving approximately 60% patency. PLAN: Follow-up in the clinic 4 to 6 weeks post discharge. Electronically Signed   By: Luanne Bras M.D.   On: 01/03/2022 08:12   IR CT Head Ltd  Result Date: 01/03/2022 CLINICAL DATA:  Left-sided weakness with dysarthria. Patient with calcified embolus in the inferior division right MCA. Discovered to have high-grade stenosis of the extracranial right ICA proximally EXAM: IR ANGIO INTRA EXTRACRAN SEL COM CAROTID INNOMINATE UNI RIGHT MOD SED COMPARISON:  CT angiogram of the head and neck of Dec 30, 2021. MEDICATIONS: Heparin 3,000 units IV. Ancef 2 g IV antibiotic was administered within 1 hour of the procedure. ANESTHESIA/SEDATION: General anesthesia. CONTRAST:  Omnipaque 300 approximately 120 cc. FLUOROSCOPY TIME:  Fluoroscopy Time: 76 minutes 36) seconds (1257 mGy). COMPLICATIONS: None immediate. TECHNIQUE: Informed written consent was obtained from the patient after a thorough discussion of the procedural risks, benefits and alternatives. All questions were addressed. Maximal Sterile Barrier Technique was utilized including  caps, mask, sterile gowns, sterile gloves, sterile drape, hand hygiene and skin antiseptic. A timeout was performed prior to the initiation of the procedure. The left groin was prepped and draped in the usual sterile fashion. Thereafter using modified Seldinger technique, transfemoral access into the left common femoral artery was obtained without difficulty. Over a 0.035 inch guidewire, an 8 French 25 cm Pinnacle sheath was inserted. Through this, and also over 0.035 inch guidewire, a 120 cm 6 French Simmons 2 catheter inside of an 087 95 cm balloon guide catheter combination was advanced to the aortic arch region and selectively positioned in the proximal right common carotid artery and the innominate artery. An arteriogram was then performed centered extra cranially and intracranially. FINDINGS: The innominate arteriogram demonstrates significant tortuosity of the innominate artery in its entirety extending into the right common carotid artery with a 180 degree loop in the proximal right common carotid artery. Subclavian artery demonstrates patency with opacification of a hypoplastic right vertebral artery extra cranially. The right common carotid arteriogram demonstrates complex atherosclerotic calcified plaque at the right common carotid bifurcation extending into the bulb region of the right internal carotid artery resulting in a high-grade approximately 80-90% stenosis. More distally, the right internal carotid artery opacifies to the cranial skull base. Patency is seen of the petrous, cavernous and supraclinoid right ICA with a prominent right posterior communicating artery. Proximal portions of the right middle cerebral artery and the right anterior cerebral artery demonstrate patency. ENDOVASCULAR REVASCULARIZATION OF HIGH-GRADE STENOSIS OF THE RIGHT INTERNAL CAROTID ARTERY PROXIMALLY WITH BALLOON ANGIOPLASTY The balloon guide catheter was advanced into the proximal right common carotid artery. Through  this, and also over a 0.014 inch Transend EX soft tip micro guidewire, an 021 150 cm microcatheter was then advanced with biplane DSA roadmap to the proximal cavernous right ICA followed by the microcatheter. The micro guidewire was removed. Good aspiration obtained from the hub of the microcatheter. A gentle control arteriogram performed through the microcatheter demonstrated safe positioning of the tip of the microcatheter. This in turn was then exchanged out for a 300 cm 014 inch BMW exchange wire with a moderate J configuration. The tip of the micro guidewire was positioned at the petrous cavernous junction. Measurements were then performed of the right internal carotid artery distal to the stenosis, and just proximal to the stenosis of the distal common carotid artery. A 5 mm  x 20 mm 014 inch Viatrac balloon guide catheter was then prepped and purged retrogradely with heparinized saline infusion, and retrogradely with 50% contrast and 50% heparinized saline infusion. Using the rapid exchange technique, the balloon catheter was then advanced without difficulty and positioned with the distal and proximal markers adequate distant from the site of the severe stenosis in the proximal right internal carotid artery. Slow control inflation was then performed using micro inflation syringe device via micro tubing. Inflations were performed slowly to 9 atmospheres where it was maintained for approximately 60 seconds. Thereafter, the balloon was deflated and retrieved and removed. A control arteriogram performed through the balloon guide catheter in the proximal right common carotid artery demonstrated significantly improved caliber and flow through the angioplastied segment. More distally, free flow was noted into the distal right ICA intracranially. Over the exchange BMW micro guidewire, a 8 mm x 26 mm Wallstent stent delivery system which had been prepped with heparinized saline infusion was then advanced again using  the rapid exchange technique to the proximal right common carotid artery. Resistance was encountered to further advancement of the stent delivery apparatus. At this time the balloon guide catheter was advanced to the distal right common carotid artery. Further attempts were made to advance the stent delivery apparatus into the right internal carotid proximally. Advancement could only manage just distal to the angioplastied segment with no significant coverage of the angioplastied segment. Further attempts at advancing were met with resistance secondary to a step-off related to the calcified plaque. After multiple attempts it was decided to stop the procedure. Final control arteriogram performed through the balloon guide catheter in the right common carotid artery continued to demonstrate excellent flow through the right external carotid artery with a mild-to-moderate stenosis at its origin. The right internal carotid artery proximally demonstrated approximately 60% patency which was maintained on subsequent 20 minute arteriogram. Centered intracranially demonstrated brisk flow into the right anterior cerebral artery and the right middle cerebral distribution. The previously occluded anterior branch of the inferior division proximally due to a calcified plaque remained stable. The delayed arterial phase demonstrated retrograde opacification of the posterior 2/3 of the cortical branches in the sylvian triangle from the branches of the inferior division and the superior division, and also from the pericallosal branches. The balloon guide catheter was removed. The 8 French Pinnacle sheath in the left common femoral artery was removed with manual compression held for approximately 25 minutes with a quick clot to achieved hemostasis at this site. Distal pulses remained palpable in the dorsalis pedis arteries bilaterally unchanged. An immediate CT of the brain demonstrated no evidence of hemorrhagic complications. The  patient was then gradually extubated without difficulty. Patient was able to maintain his airway, and oxygen concentrations. He was able to move his right side spontaneously and to command. The patient was able to follow simple commands appropriately. No significant motor movement was evident in the left upper extremity, with only a flicker in the left foot toes. Patient was then transferred to the PACU and then subsequently the neuro ICU for post revascularization care. Later in the afternoon, the patient appeared more awake, alert and appropriately responsive. Had mild intermittent right gaze deviation but was able to track his eyes to the left past the midline. Otherwise, neurologically he remained stable unchanged. 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 PLAN: Follow-up in  the clinic 4 to 6 weeks post discharge. Electronically Signed   By: Luanne Bras M.D.   On: 01/03/2022 08:12   IR PTA Intracranial  Result Date: 01/03/2022 CLINICAL DATA:  Left-sided weakness with dysarthria. Patient with calcified embolus in the inferior division right MCA. Discovered to have high-grade stenosis of the extracranial right ICA proximally EXAM: IR ANGIO INTRA EXTRACRAN SEL COM CAROTID INNOMINATE UNI RIGHT MOD SED COMPARISON:  CT angiogram of the head and neck of Dec 30, 2021. MEDICATIONS: Heparin 3,000 units IV. Ancef 2 g IV antibiotic was administered within 1 hour of the procedure. ANESTHESIA/SEDATION: General anesthesia. CONTRAST:  Omnipaque 300 approximately 120 cc. FLUOROSCOPY TIME:  Fluoroscopy Time: 76 minutes 36) seconds (1257 mGy). COMPLICATIONS: None immediate. TECHNIQUE: Informed written consent was obtained from the patient after a thorough discussion of the procedural risks, benefits and alternatives. All questions were addressed.  Maximal Sterile Barrier Technique was utilized including caps, mask, sterile gowns, sterile gloves, sterile drape, hand hygiene and skin antiseptic. A timeout was performed prior to the initiation of the procedure. The left groin was prepped and draped in the usual sterile fashion. Thereafter using modified Seldinger technique, transfemoral access into the left common femoral artery was obtained without difficulty. Over a 0.035 inch guidewire, an 8 French 25 cm Pinnacle sheath was inserted. Through this, and also over 0.035 inch guidewire, a 120 cm 6 French Simmons 2 catheter inside of an 087 95 cm balloon guide catheter combination was advanced to the aortic arch region and selectively positioned in the proximal right common carotid artery and the innominate artery. An arteriogram was then performed centered extra cranially and intracranially. FINDINGS: The innominate arteriogram demonstrates significant tortuosity of the innominate artery in its entirety extending into the right common carotid artery with a 180 degree loop in the proximal right common carotid artery. Subclavian artery demonstrates patency with opacification of a hypoplastic right vertebral artery extra cranially. The right common carotid arteriogram demonstrates complex atherosclerotic calcified plaque at the right common carotid bifurcation extending into the bulb region of the right internal carotid artery resulting in a high-grade approximately 80-90% stenosis. More distally, the right internal carotid artery opacifies to the cranial skull base. Patency is seen of the petrous, cavernous and supraclinoid right ICA with a prominent right posterior communicating artery. Proximal portions of the right middle cerebral artery and the right anterior cerebral artery demonstrate patency. ENDOVASCULAR REVASCULARIZATION OF HIGH-GRADE STENOSIS OF THE RIGHT INTERNAL CAROTID ARTERY PROXIMALLY WITH BALLOON ANGIOPLASTY The balloon guide catheter was advanced  into the proximal right common carotid artery. Through this, and also over a 0.014 inch Transend EX soft tip micro guidewire, an 021 150 cm microcatheter was then advanced with biplane DSA roadmap to the proximal cavernous right ICA followed by the microcatheter. The micro guidewire was removed. Good aspiration obtained from the hub of the microcatheter. A gentle control arteriogram performed through the microcatheter demonstrated safe positioning of the tip of the microcatheter. This in turn was then exchanged out for a 300 cm 014 inch BMW exchange wire with a moderate J configuration. The tip of the micro guidewire was positioned at the petrous cavernous junction. Measurements were then performed of the right internal carotid artery distal to the stenosis, and just proximal to the stenosis of the distal common carotid artery. A 5 mm x 20 mm 014 inch Viatrac balloon guide catheter was then prepped and purged retrogradely with heparinized saline infusion, and retrogradely with 50% contrast and 50% heparinized saline infusion. Using  the rapid exchange technique, the balloon catheter was then advanced without difficulty and positioned with the distal and proximal markers adequate distant from the site of the severe stenosis in the proximal right internal carotid artery. Slow control inflation was then performed using micro inflation syringe device via micro tubing. Inflations were performed slowly to 9 atmospheres where it was maintained for approximately 60 seconds. Thereafter, the balloon was deflated and retrieved and removed. A control arteriogram performed through the balloon guide catheter in the proximal right common carotid artery demonstrated significantly improved caliber and flow through the angioplastied segment. More distally, free flow was noted into the distal right ICA intracranially. Over the exchange BMW micro guidewire, a 8 mm x 26 mm Wallstent stent delivery system which had been prepped with  heparinized saline infusion was then advanced again using the rapid exchange technique to the proximal right common carotid artery. Resistance was encountered to further advancement of the stent delivery apparatus. At this time the balloon guide catheter was advanced to the distal right common carotid artery. Further attempts were made to advance the stent delivery apparatus into the right internal carotid proximally. Advancement could only manage just distal to the angioplastied segment with no significant coverage of the angioplastied segment. Further attempts at advancing were met with resistance secondary to a step-off related to the calcified plaque. After multiple attempts it was decided to stop the procedure. Final control arteriogram performed through the balloon guide catheter in the right common carotid artery continued to demonstrate excellent flow through the right external carotid artery with a mild-to-moderate stenosis at its origin. The right internal carotid artery proximally demonstrated approximately 60% patency which was maintained on subsequent 20 minute arteriogram. Centered intracranially demonstrated brisk flow into the right anterior cerebral artery and the right middle cerebral distribution. The previously occluded anterior branch of the inferior division proximally due to a calcified plaque remained stable. The delayed arterial phase demonstrated retrograde opacification of the posterior 2/3 of the cortical branches in the sylvian triangle from the branches of the inferior division and the superior division, and also from the pericallosal branches. The balloon guide catheter was removed. The 8 French Pinnacle sheath in the left common femoral artery was removed with manual compression held for approximately 25 minutes with a quick clot to achieved hemostasis at this site. Distal pulses remained palpable in the dorsalis pedis arteries bilaterally unchanged. An immediate CT of the brain  demonstrated no evidence of hemorrhagic complications. The patient was then gradually extubated without difficulty. Patient was able to maintain his airway, and oxygen concentrations. He was able to move his right side spontaneously and to command. The patient was able to follow simple commands appropriately. No significant motor movement was evident in the left upper extremity, with only a flicker in the left foot toes. Patient was then transferred to the PACU and then subsequently the neuro ICU for post revascularization care. Later in the afternoon, the patient appeared more awake, alert and appropriately responsive. Had mild intermittent right gaze deviation but was able to track his eyes to the left past the midline. Otherwise, neurologically he remained stable unchanged. 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 PLAN: Follow-up in the clinic 4 to 6 weeks post discharge. Electronically Signed   By: Luanne Bras M.D.   On: 01/03/2022 08:12   DG Swallowing Func-Speech Pathology  Result  Date: 01/02/2022 Table formatting from the original result was not included. Objective Swallowing Evaluation: Type of Study: MBS-Modified Barium Swallow Study  Patient Details Name: KUSHAL SAUNDERS MRN: [id number] Date of Birth: 12-15-1931 Today's Date: 01/02/2022 Time: SLP Start Time (ACUTE ONLY): 1100 -SLP Stop Time (ACUTE ONLY): 1123 SLP Time Calculation (min) (ACUTE ONLY): 23 min Past Medical History: Past Medical History: Diagnosis Date  Arthritis   Heart murmur   asa child   Hypertension   Stroke [facility])  Past Surgical History: Past Surgical History: Procedure Laterality Date  EYE SURGERY Bilateral 2022  JOINT REPLACEMENT    RADIOLOGY WITH ANESTHESIA N/A 01/01/2022  Procedure: Angiogram;  Surgeon: Luanne Bras, MD;  Location: [facility];  Service:  Radiology;  Laterality: N/A;  TONSILLECTOMY    TOTAL KNEE ARTHROPLASTY Right 03/30/2013  Procedure: RIGHT TOTAL KNEE ARTHROPLASTY;  Surgeon: Tobi Bastos, MD;  Location: [facility];  Service: Orthopedics;  Laterality: Right;  TOTAL KNEE ARTHROPLASTY Left 10/09/2020  Procedure: TOTAL KNEE ARTHROPLASTY;  Surgeon: Paralee Cancel, MD;  Location: [facility];  Service: Orthopedics;  Laterality: Left;  70 mins HPI: 88 yo male presenting to the ED on 5/28 with L sided weakness, facial droop, slurred speech, and fall. 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 PMH including hypothyroidism chronic pain syndromes, CKD, HTN, arthritis, bil TKA.  Subjective: alert, friendly  Recommendations for follow up therapy are one component of a multi-disciplinary discharge planning process, led by the attending physician.  Recommendations may be updated based on patient status, additional functional criteria and insurance authorization. Assessment / Plan / Recommendation   01/02/2022  11:00 AM Clinical Impressions Clinical Impression Pt presents with oropharyngeal dysphagia characterized by reduced labial seal, impaired mastication, a pharyngeal delay and reduction in bolus cohesion, posterior bolus propulsion, tongue base retraction, anterior laryngeal movement, and pharyngeal constriction. He demonstrated left-sided anterior spillage, lingual residue, difficulty with A-P transport of solids, premature spillage to the valleculae and pyriform sinuses, and posterior pharyngeal wall residue. A liquid bolus was necessary to facilitate mastication and A-P transport of a nutrigrain bar. Liquid washes reduced pharyngeal residue to a more functional level and a left head turn was effective in reducing pyriform sinus residue with thin liquids, but pt exhibited notable difficulty maintaining this position. Penetration (PAS 3, 5) and aspiration (PAS 7, 8) were noted with thin liquids. Larger amounts  of aspirate did trigger a cough which was mostly effective, but smaller quantities inconsistently triggered throat clearing which was inadequate for expulsion of material. No functional benefit was noted with prompted coughing. A dysphagia 1 diet with nectar thick liquids is recommended at this time. SLP will follow for dysphagia treatment. SLP Visit Diagnosis Dysphagia, oropharyngeal phase (R13.12) Impact on safety and function Mild aspiration risk     01/02/2022  11:00 AM Treatment Recommendations Treatment Recommendations Therapy as outlined in treatment plan below     01/02/2022  11:00 AM Prognosis Prognosis for Safe Diet Advancement Good   01/02/2022  11:00 AM Diet Recommendations SLP Diet Recommendations Dysphagia 1 (Puree) solids;Nectar thick liquid Liquid Administration via Cup;Straw Medication Administration Whole meds with puree Compensations Slow rate;Small sips/bites;Monitor for anterior loss;Follow solids with liquid Postural Changes Seated upright at 90 degrees     01/02/2022  11:00 AM Other Recommendations Other Recommendations Order thickener from pharmacy Follow Up Recommendations Acute inpatient rehab (3hours/day) Assistance recommended at discharge Frequent or constant Supervision/Assistance Functional Status Assessment Patient has had a recent decline in their functional status  and demonstrates the ability to make significant improvements in function in a reasonable and predictable amount of time.   01/02/2022  11:00 AM Frequency and Duration  Speech Therapy Frequency (ACUTE ONLY) min 2x/week Treatment Duration 2 weeks     01/02/2022  11:00 AM Oral Phase Oral Phase Impaired Oral - Nectar Cup Left anterior bolus loss;Reduced posterior propulsion;Lingual/palatal residue;Delayed oral transit;Decreased bolus cohesion;Premature spillage Oral - Nectar Straw Left anterior bolus loss;Reduced posterior propulsion;Lingual/palatal residue;Delayed oral transit;Decreased bolus cohesion;Premature spillage Oral - Thin Cup  Left anterior bolus loss;Reduced posterior propulsion;Lingual/palatal residue;Delayed oral transit;Decreased bolus cohesion;Premature spillage Oral - Thin Straw Left anterior bolus loss;Reduced posterior propulsion;Lingual/palatal residue;Delayed oral transit;Decreased bolus cohesion;Premature spillage Oral - Puree Left anterior bolus loss;Reduced posterior propulsion;Lingual/palatal residue;Delayed oral transit;Decreased bolus cohesion;Weak lingual manipulation Oral - Mech Soft Left anterior bolus loss;Reduced posterior propulsion;Lingual/palatal residue;Delayed oral transit;Decreased bolus cohesion;Weak lingual manipulation;Impaired mastication Oral - Pill Left anterior bolus loss;Reduced posterior propulsion;Lingual/palatal residue;Delayed oral transit;Decreased bolus cohesion    01/02/2022  11:00 AM Pharyngeal Phase Pharyngeal Phase Impaired Pharyngeal- Thin Cup Reduced tongue base retraction;Reduced anterior laryngeal mobility;Delayed swallow initiation-pyriform sinuses;Reduced pharyngeal peristalsis;Penetration/Aspiration during swallow;Penetration/Apiration after swallow;Pharyngeal residue - valleculae;Pharyngeal residue - pyriform;Pharyngeal residue - posterior pharnyx Pharyngeal Material enters airway, remains ABOVE vocal cords and not ejected out;Material enters airway, CONTACTS cords and not ejected out;Material enters airway, passes BELOW cords and not ejected out despite cough attempt by patient;Material enters airway, passes BELOW cords without attempt by patient to eject out (silent aspiration) Pharyngeal- Thin Straw Reduced tongue base retraction;Reduced anterior laryngeal mobility;Delayed swallow initiation-pyriform sinuses;Reduced pharyngeal peristalsis;Penetration/Aspiration during swallow;Penetration/Apiration after swallow;Pharyngeal residue - valleculae;Pharyngeal residue - pyriform;Pharyngeal residue - posterior pharnyx Pharyngeal Material enters airway, passes BELOW cords and not ejected out  despite cough attempt by patient;Material enters airway, passes BELOW cords without attempt by patient to eject out (silent aspiration);Material enters airway, CONTACTS cords and then ejected out Pharyngeal- Puree Reduced tongue base retraction;Reduced anterior laryngeal mobility;Delayed swallow initiation-pyriform sinuses;Reduced pharyngeal peristalsis;Pharyngeal residue - valleculae;Pharyngeal residue - pyriform;Pharyngeal residue - posterior pharnyx Pharyngeal- Mechanical Soft Reduced tongue base retraction;Reduced anterior laryngeal mobility;Delayed swallow initiation-pyriform sinuses;Reduced pharyngeal peristalsis;Pharyngeal residue - valleculae;Pharyngeal residue - pyriform;Pharyngeal residue - posterior pharnyx Pharyngeal- Pill Reduced tongue base retraction;Reduced anterior laryngeal mobility;Delayed swallow initiation-pyriform sinuses;Reduced pharyngeal peristalsis;Pharyngeal residue - valleculae;Pharyngeal residue - pyriform;Pharyngeal residue - posterior pharnyx    01/02/2022  11:00 AM Cervical Esophageal Phase  Cervical Esophageal Phase [facility] Shanika I. Hardin Negus, [place], [place] Office number [phone] Pager [phone] Horton Marshall 01/02/2022, 12:20 PM                     VAS US CAROTID  Result Date: 12/31/2021 Carotid Arterial Duplex Study Patient Name:  TRAYLON SCHIMMING  Date of Exam:   12/30/2021 Medical Rec #: [id number]          Accession #:    [id number] Date of Birth: 01/05/1932           Patient Gender: M Patient Age:   31 years Exam Location:  [facility] Procedure:      VAS US CAROTID Referring Phys: Nyoka Lint DOUTOVA --------------------------------------------------------------------------------  Indications:       CVA. Risk Factors:      Hypertension. Comparison Study:  No prior study Performing Technologist: Maudry Mayhew MHA, RDMS, RVT, RDCS  Examination Guidelines: A complete evaluation includes B-mode imaging, spectral Doppler, color Doppler,  and power Doppler as needed of all accessible portions of each vessel. Bilateral testing is considered an integral part of  a complete examination. Limited examinations for reoccurring indications may be performed as noted.  Right Carotid Findings: +----------+-------+--------+--------+-------------------------------+---------+           PSV    EDV cm/sStenosisPlaque Description             Comments            cm/s                                                            +----------+-------+--------+--------+-------------------------------+---------+ CCA Prox  54     7               heterogenous and irregular               +----------+-------+--------+--------+-------------------------------+---------+ CCA Distal58     9               smooth and heterogenous                  +----------+-------+--------+--------+-------------------------------+---------+ ICA Prox  132    19              smooth, heterogenous and       Shadowing                                  calcific                                 +----------+-------+--------+--------+-------------------------------+---------+ ICA Distal78     15                                                       +----------+-------+--------+--------+-------------------------------+---------+ ECA       126                                                             +----------+-------+--------+--------+-------------------------------+---------+ +----------+--------+-------+----------------+-------------------+           PSV cm/sEDV cmsDescribe        Arm Pressure (mmHG) +----------+--------+-------+----------------+-------------------+ [id number]            Multiphasic, WNL                    +----------+--------+-------+----------------+-------------------+ +---------+--------+--+--------+-+---------+ VertebralPSV cm/s51EDV cm/s5Antegrade +---------+--------+--+--------+-+---------+  Left Carotid  Findings: +----------+--------+--------+--------+------------------------------+---------+           PSV cm/sEDV cm/sStenosisPlaque Description            Comments  +----------+--------+--------+--------+------------------------------+---------+ CCA Prox  89                      heterogenous and irregular    tortuous  +----------+--------+--------+--------+------------------------------+---------+ CCA Distal70      8                                                       +----------+--------+--------+--------+------------------------------+---------+  ICA Prox  49      12              heterogenous, irregular and   Shadowing                                   calcific                                +----------+--------+--------+--------+------------------------------+---------+ ICA Distal98      18                                                      +----------+--------+--------+--------+------------------------------+---------+ ECA       67      10                                                      +----------+--------+--------+--------+------------------------------+---------+ +----------+--------+--------+----------------+-------------------+           PSV cm/sEDV cm/sDescribe        Arm Pressure (mmHG) +----------+--------+--------+----------------+-------------------+ [id number]             Multiphasic, WNL                    +----------+--------+--------+----------------+-------------------+ +---------+--------+--+--------+--+---------+ VertebralPSV cm/s94EDV cm/s14Antegrade +---------+--------+--+--------+--+---------+   Summary: Right Carotid: Velocities in the right ICA are consistent with a 1-39% stenosis. Left Carotid: Velocities in the left ICA are consistent with a 1-39% stenosis. Vertebrals:  Bilateral vertebral arteries demonstrate antegrade flow. Subclavians: Normal flow hemodynamics were seen in bilateral subclavian               arteries. *See table(s) above for measurements and observations.  Electronically signed by Antony Contras MD on 12/31/2021 at 3:59:42 PM.    Final    ECHOCARDIOGRAM COMPLETE  Result Date: 12/30/2021    ECHOCARDIOGRAM REPORT   Patient Name:   DELMON ANDRADA Date of Exam: 12/30/2021 Medical Rec #:  [id number]         Height:       66.0 in Accession #:    [id number]        Weight:       133.6 lb Date of Birth:  1931/09/02          BSA:          1.685 m Patient Age:    56 years          BP:           161/84 mmHg Patient Gender: M                 HR:           66 bpm. Exam Location:  Inpatient Procedure: 2D Echo Indications:    stroke  History:        Patient has prior history of Echocardiogram examinations, most                 recent 04/25/2020. Chronic kidney disease.; Risk  Factors:Hypertension and Dyslipidemia.  Sonographer:    Johny Chess RDCS Referring Phys: [place]  1. Left ventricular ejection fraction, by estimation, is 55 to 60%. The left ventricle has normal function. The left ventricle has no regional wall motion abnormalities. There is mild left ventricular hypertrophy. Left ventricular diastolic parameters are consistent with Grade I diastolic dysfunction (impaired relaxation).  2. Right ventricular systolic function is normal. The right ventricular size is normal.  3. Left atrial size was mildly dilated.  4. The mitral valve is normal in structure. Mild mitral valve regurgitation. No evidence of mitral stenosis.  5. The aortic valve is tricuspid. Aortic valve regurgitation is mild. Aortic valve sclerosis/calcification is present, without any evidence of aortic stenosis.  6. The inferior vena cava is normal in size with greater than 50% respiratory variability, suggesting right atrial pressure of 3 mmHg. FINDINGS  Left Ventricle: Left ventricular ejection fraction, by estimation, is 55 to 60%. The left ventricle has normal function. The left ventricle has no  regional wall motion abnormalities. The left ventricular internal cavity size was normal in size. There is  mild left ventricular hypertrophy. Left ventricular diastolic parameters are consistent with Grade I diastolic dysfunction (impaired relaxation). Right Ventricle: The right ventricular size is normal. Right ventricular systolic function is normal. Left Atrium: Left atrial size was mildly dilated. Right Atrium: Right atrial size was normal in size. Pericardium: Trivial pericardial effusion is present. Mitral Valve: The mitral valve is normal in structure. Mild mitral annular calcification. Mild mitral valve regurgitation. No evidence of mitral valve stenosis. Tricuspid Valve: The tricuspid valve is normal in structure. Tricuspid valve regurgitation is trivial. No evidence of tricuspid stenosis. Aortic Valve: The aortic valve is tricuspid. Aortic valve regurgitation is mild. Aortic regurgitation PHT measures 400 msec. Aortic valve sclerosis/calcification is present, without any evidence of aortic stenosis. Pulmonic Valve: The pulmonic valve was normal in structure. Pulmonic valve regurgitation is trivial. No evidence of pulmonic stenosis. Aorta: The aortic root is normal in size and structure. Venous: The inferior vena cava is normal in size with greater than 50% respiratory variability, suggesting right atrial pressure of 3 mmHg. IAS/Shunts: No atrial level shunt detected by color flow Doppler.  LEFT VENTRICLE PLAX 2D LVIDd:         5.20 cm   Diastology LVIDs:         3.10 cm   LV e' medial:    4.50 cm/s LV PW:         1.20 cm   LV E/e' medial:  12.0 LV IVS:        1.10 cm   LV e' lateral:   4.20 cm/s LVOT diam:     2.10 cm   LV E/e' lateral: 12.9 LV SV:         80 LV SV Index:   47 LVOT Area:     3.46 cm  RIGHT VENTRICLE             IVC RV S prime:     15.70 cm/s  IVC diam: 1.00 cm TAPSE (M-mode): 2.0 cm LEFT ATRIUM             Index        RIGHT ATRIUM           Index LA diam:        3.40 cm 2.02 cm/m   RA  Area:     16.30 cm LA Vol (A2C):   79.2 ml 47.01 ml/m  RA  Volume:   39.80 ml  23.62 ml/m LA Vol (A4C):   47.5 ml 28.20 ml/m LA Biplane Vol: 63.0 ml 37.40 ml/m  AORTIC VALVE LVOT Vmax:   102.00 cm/s LVOT Vmean:  67.600 cm/s LVOT VTI:    0.230 m AI PHT:      400 msec  AORTA Ao Root diam: 3.70 cm Ao Asc diam:  3.10 cm MITRAL VALVE MV Area (PHT): 4.60 cm    SHUNTS MV Decel Time: 165 msec    Systemic VTI:  0.23 m MR Peak grad: 134.6 mmHg   Systemic Diam: 2.10 cm MR Mean grad: 82.0 mmHg MR Vmax:      580.00 cm/s MR Vmean:     416.0 cm/s MV E velocity: 54.00 cm/s MV A velocity: 96.40 cm/s MV E/A ratio:  0.56 Kirk Ruths MD Electronically signed by Kirk Ruths MD Signature Date/Time: 12/30/2021/2:40:23 PM    Final    CT ANGIO HEAD NECK W WO CM  Result Date: 12/30/2021 CLINICAL DATA:  Stroke/TIA, determine embolic source EXAM: CT ANGIOGRAPHY HEAD AND NECK TECHNIQUE: Multidetector CT imaging of the head and neck was performed using the standard protocol during bolus administration of intravenous contrast. Multiplanar CT image reconstructions and MIPs were obtained to evaluate the vascular anatomy. Carotid stenosis measurements (when applicable) are obtained utilizing NASCET criteria, using the distal internal carotid diameter as the denominator. RADIATION DOSE REDUCTION: This exam was performed according to the departmental dose-optimization program which includes automated exposure control, adjustment of the mA and/or kV according to patient size and/or use of iterative reconstruction technique. CONTRAST:  23m OMNIPAQUE IOHEXOL 350 MG/ML SOLN COMPARISON:  Same day MRI/MRA. FINDINGS: CT HEAD FINDINGS Brain: Small right MCA territory infarcts better characterized on same day MRI. No evidence of acute hemorrhage, mass effect, midline shift, extra-axial fluid collection, or hydrocephalus. Patchy white matter hypodensities, nonspecific but compatible with chronic microvascular ischemic disease. Vascular: Detailed  below. Skull: No acute fracture. Sinuses: Mild paranasal sinus mucosal thickening. Orbits: No acute orbital findings. Review of the MIP images confirms the above findings CTA NECK FINDINGS Aortic arch: Calcific atherosclerosis of the aorta and great vessel origins. Ectatic right subclavian artery. Right carotid system: Atherosclerosis at the carotid bifurcation with approximately 80% stenosis. Left carotid system: Atherosclerosis at the carotid bifurcation without greater than 50% stenosis. Vertebral arteries: Potentially severe stenosis of the right vertebral artery origin due to calcific atherosclerosis. Otherwise, vertebral arteries are patent without significant (greater than 50%) stenosis. Left dominant. Skeleton: Severe multilevel degenerative change. Other neck: No acute findings. Upper chest: Clear sinuses. Review of the MIP images confirms the above findings CTA HEAD FINDINGS Anterior circulation: Bilateral intracranial ICAs are patent with mild narrowing due to calcific atherosclerosis. Bilateral M1 MCAs and left M2 MCAs are patent. Apparent occlusion of a right M2 MCA branch in a region of calcification. Bilateral ACAs are patent. Posterior circulation: Left dominant intradural vertebral artery. Right intradural vertebral artery makes very small contribution to the basilar artery. Basilar artery and bilateral posterior cerebral arteries are patent without proximal hemodynamically significant stenosis. Venous sinuses: As permitted by contrast timing, patent. Anatomic variants: Detailed above. Review of the MIP images confirms the above findings IMPRESSION: CTA: 1. 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 2. Extensive calcific atherosclerosis at the right carotid bifurcation with approximately 80% stenosis of the proximal ICA. 3. Potentially  severe stenosis of the non  dominant right vertebral artery origin. CT head: Small right MCA territory infarcts better characterized on same day MRI. No evidence of progressive mass effect or acute hemorrhage. Findings discussed with Dr. Erlinda Hong via telephone at 12:50 p.m. Electronically Signed   By: Margaretha Sheffield M.D.   On: 12/30/2021 13:00   MR BRAIN WO CONTRAST  Result Date: 12/30/2021 CLINICAL DATA:  There are deficit with acute stroke suspected EXAM: MRI HEAD WITHOUT CONTRAST MRA HEAD WITHOUT CONTRAST TECHNIQUE: Multiplanar, multi-echo pulse sequences of the brain and surrounding structures were acquired without intravenous contrast. Angiographic images of the Circle of Willis were acquired using MRA technique without intravenous contrast. COMPARISON:  Head CT from yesterday FINDINGS: MRI HEAD FINDINGS Brain: Cluster of small acute infarcts in the right insular cortex, right frontal cortex, and deep right cerebral white matter. These are in a MCA distribution. Background of advanced chronic small vessel ischemia with confluent gliosis in the hemispheric white matter. Chronic lacunar infarcts in the deep cerebrum. Small remote left cerebral infarct. Gradient signal at the lower right sylvian fissure with there is calcification along the MCA branches by CT. Vascular: Major flow voids are preserved Skull and upper cervical spine: Normal marrow signal Sinuses/Orbits: Bilateral cataract resection MRA HEAD FINDINGS Anterior circulation: High-grade narrowing involving the upper right M2 branch with there is a gradient hypointensity noted above. Elsewhere vessels are smoothly contoured and widely patent. Posterior circulation: Vertebrobasilar arteries are smoothly contoured and widely patent. IMPRESSION: Brain MRI: 1. Cluster of small cortical and white matter infarcts within the right MCA distribution. 2. Background of advanced chronic small vessel ischemia. Intracranial MRA: Focal high-grade narrowing at the right  M2 level where there is calcification by CT. This could reflect calcified plaque or a calcified embolus. If an embolus, the proximal nature and degree of mild infarct with suggest additional territory of risk. Electronically Signed   By: Jorje Guild M.D.   On: 12/30/2021 07:53   MR ANGIO HEAD WO CONTRAST  Result Date: 12/30/2021 CLINICAL DATA:  There are deficit with acute stroke suspected EXAM: MRI HEAD WITHOUT CONTRAST MRA HEAD WITHOUT CONTRAST TECHNIQUE: Multiplanar, multi-echo pulse sequences of the brain and surrounding structures were acquired without intravenous contrast. Angiographic images of the Circle of Willis were acquired using MRA technique without intravenous contrast. COMPARISON:  Head CT from yesterday FINDINGS: MRI HEAD FINDINGS Brain: Cluster of small acute infarcts in the right insular cortex, right frontal cortex, and deep right cerebral white matter. These are in a MCA distribution. Background of advanced chronic small vessel ischemia with confluent gliosis in the hemispheric white matter. Chronic lacunar infarcts in the deep cerebrum. Small remote left cerebral infarct. Gradient signal at the lower right sylvian fissure with there is calcification along the MCA branches by CT. Vascular: Major flow voids are preserved Skull and upper cervical spine: Normal marrow signal Sinuses/Orbits: Bilateral cataract resection MRA HEAD FINDINGS Anterior circulation: High-grade narrowing involving the upper right M2 branch with there is a gradient hypointensity noted above. Elsewhere vessels are smoothly contoured and widely patent. Posterior circulation: Vertebrobasilar arteries are smoothly contoured and widely patent. IMPRESSION: Brain MRI: 1. Cluster of small cortical and white matter infarcts within the right MCA distribution. 2. Background of advanced chronic small vessel ischemia. Intracranial MRA: Focal high-grade narrowing at the right M2 level where there is calcification by CT. This could  reflect calcified plaque or a calcified embolus. If an embolus, the proximal nature and degree of mild infarct with suggest additional territory of risk. Electronically Signed  By: Jorje Guild M.D.   On: 12/30/2021 07:53   DG Chest Portable 1 View  Result Date: 12/29/2021 CLINICAL DATA:  CVA. Fell last night. Family concerned patient injured left side EXAM: PORTABLE CHEST 1 VIEW COMPARISON:  Chest two views 03/23/2013 FINDINGS: Cardiac silhouette is mildly enlarged. Moderate calcifications within the aortic arch and descending thoracic aorta. Minimal bibasilar horizontal linear chronic scarring is unchanged. No focal airspace opacity to indicate pneumonia. No pleural effusion or pneumothorax. Moderate multilevel degenerative disc changes of the thoracic spine. IMPRESSION: Mild stable cardiomegaly. No acute lung process. Electronically Signed   By: Yvonne Kendall M.D.   On: 12/29/2021 19:19   DG Hand Complete Left  Result Date: 12/29/2021 CLINICAL DATA:  Fall.  Bruising to posterior left hand. EXAM: LEFT HAND - COMPLETE 3+ VIEW COMPARISON:  None Available. FINDINGS: There is diffuse decreased bone mineralization. A pulse oximeter overlies the distal index finger obscuring portions. Degenerative changes including joint space narrowing, subchondral sclerosis and peripheral osteophytosis are moderate at the triscaphe joint; moderate to severe at the thumb carpometacarpal, thumb metacarpophalangeal, and thumb interphalangeal joints; and moderate to severe at the DIP joints and moderate at the PIP joints of the second through fifth fingers. No acute fracture is seen. No dislocation. IMPRESSION: Osteoarthritis as above. No acute fracture. Electronically Signed   By: Yvonne Kendall M.D.   On: 12/29/2021 18:12   DG Elbow Complete Left  Result Date: 12/29/2021 CLINICAL DATA:  Fall.  Left elbow pain and bruising. EXAM: LEFT ELBOW - COMPLETE 3+ VIEW COMPARISON:  None Available. FINDINGS: No fracture or  dislocation. Narrowed radiocapitellar joint. Marginal osteophytes project from the base of the radial head. No joint effusion. There is soft tissue edema posteriorly. IMPRESSION: No fracture or dislocation. Electronically Signed   By: Lajean Manes M.D.   On: 12/29/2021 18:12   CT Cervical Spine Wo Contrast  Result Date: 12/29/2021 CLINICAL DATA:  There logic deaf sick, neck trauma EXAM: CT CERVICAL SPINE WITHOUT CONTRAST TECHNIQUE: Multidetector CT imaging of the cervical spine was performed without intravenous contrast. Multiplanar CT image reconstructions were also generated. RADIATION DOSE REDUCTION: This exam was performed according to the departmental dose-optimization program which includes automated exposure control, adjustment of the mA and/or kV according to patient size and/or use of iterative reconstruction technique. COMPARISON:  None Available. FINDINGS: Alignment: Mild degenerative anterolisthesis of C2 and C3. Otherwise alignment is anatomic. Skull base and vertebrae: No acute fracture. No primary bone lesion or focal pathologic process. Soft tissues and spinal canal: No prevertebral fluid or swelling. No visible canal hematoma. Disc levels: Partial bony fusion across the disc spaces at C3-4 and C4-5. There is severe multilevel spondylosis throughout the remainder of the cervical spine, greatest at C5-6 and C6-7. Diffuse facet hypertrophy. Upper chest: Airway is patent. Lung apices are clear. Prominent atherosclerosis of the aortic arch. Other: Reconstructed images demonstrate no additional findings. IMPRESSION: 1. Extensive multilevel cervical degenerative changes. No acute fracture. Electronically Signed   By: Randa Ngo M.D.   On: 12/29/2021 17:38   CT HEAD WO CONTRAST  Result Date: 12/29/2021 CLINICAL DATA:  Neurologic deficit EXAM: CT HEAD WITHOUT CONTRAST TECHNIQUE: Contiguous axial images were obtained from the base of the skull through the vertex without intravenous contrast.  RADIATION DOSE REDUCTION: This exam was performed according to the departmental dose-optimization program which includes automated exposure control, adjustment of the mA and/or kV according to patient size and/or use of iterative reconstruction technique. COMPARISON:  None Available. FINDINGS: Brain:  Confluent hypodensities throughout the periventricular white matter are most consistent with age-indeterminate small vessel ischemic changes, likely chronic. Focal hypodensities in the left basal ganglia and right frontal periventricular white matter consistent with chronic lacunar infarcts. Age-indeterminate lacunar infarct is seen within the right basal ganglia image 18/3. No other signs of acute infarct or hemorrhage. Lateral ventricles and midline structures are otherwise unremarkable. No acute extra-axial fluid collections. No mass effect. Vascular: No hyperdense vessel or unexpected calcification. Skull: Normal. Negative for fracture or focal lesion. Sinuses/Orbits: No acute finding. Other: None. IMPRESSION: 1. Age indeterminate lacunar infarct within the right basal ganglia. 2. Age-indeterminate small-vessel ischemic changes throughout the periventricular white matter, favor chronic. 3. Chronic left basal ganglia and right frontal white matter lacunar infarcts as above. 4. No acute hemorrhage. Electronically Signed   By: Randa Ngo M.D.   On: 12/29/2021 17:35    Labs:  Basic Metabolic Panel: Recent Labs  Lab 01/09/22 0528 01/10/22 1831 01/11/22 0515 01/11/22 1650 01/12/22 0522  NA  --   --   --   --  146*  K  --   --   --   --  3.3*  CL  --   --   --   --  116*  CO2  --   --   --   --  22  GLUCOSE  --   --   --   --  134*  BUN  --   --   --   --  30*  CREATININE 1.46*  --   --   --  1.30*  CALCIUM  --   --   --   --  8.3*  MG  --  2.1 2.0 1.9 1.9  PHOS  --  3.3 2.9 2.2* 1.8*    CBC: No results for input(s): "WBC", "NEUTROABS", "HGB", "HCT", "MCV", "PLT" in the last 168  hours.  CBG: Recent Labs  Lab 01/14/22 1129 01/14/22 1604 01/14/22 2012 01/04/2022 0007 01/04/2022 0346  GLUCAP 133* 129* 151* 147* 137*   Family history.  Positive for hypertension as well as hyperlipidemia.  Denies any colon cancer esophageal cancer or rectal cancer  Brief HPI:   ZACCHEAUS STORLIE is a 87 y.o. right-handed male with history of CKD stage III hyperlipidemia mild aortic regurgitation bilateral total knee arthroplasty chronic pain using hydrocodone 7.5-325 mg 3 times daily as needed, BPH maintained on Flomax.  Per chart review lives alone.  Presented 12/29/2021 with acute onset of left-sided weakness resulting in a fall.  Denied loss of consciousness.  Cranial CT scan showed age-indeterminate lacunar infarction within the right basal ganglia.  Age-indeterminate small vessel ischemic changes throughout the periventricular white matter, favor chronic.  No acute hemorrhage.  CT cervical spine negative.  MRI/MRA showed cluster of small cortical and white matter infarcts within the right MCA distribution.  MRI focal high-grade narrowing at the right M2 level where there was calcification by CT.  CTA of the neck apparent occlusion of proximal right M2 MCA branch in the region of calcification possibly calcified embolus or calcified plaque.  Extensive calcific atherosclerosis of the right carotid bifurcation with approximately 80% stenosis of the proximal ICA.  Potentially severe stenosis of the nondominant right vertebral artery origin.  Patient did not receive tPA.  Echocardiogram with ejection fraction of 55 to 60% no wall motion abnormalities.  Admission chemistries unremarkable except BUN 35 creatinine 1.46 CK within normal limits troponin 76.  Hospital course interventional radiology consulted for extensive calcified  atherosclerosis of the right ICA bifurcation with 80% stenosis status post right carotid angioplasty 5/31.  Placed on aspirin as well as Brilinta therapy for CVA prophylaxis.   Plan follow-up outpatient interventional radiology 1 to 2 months if restenosis to consider right CEA.  Maintained on Lovenox for DVT prophylaxis.  Dysphagia #1 pudding thick liquid diet advance to nectar liquids 01/02/2022.  Therapy evaluations completed and patient was admitted for a comprehensive rehab program.   Hospital Course: ZINEDINE ELLNER was admitted to rehab 01/06/2022 for inpatient therapies to consist of PT, ST and OT at least three hours five days a week. Past admission physiatrist, therapy team and rehab RN have worked together to provide customized collaborative inpatient rehab.  Pertaining to patient's right MCA scattered infarct due to right M2 occlusions secondary to large vessel disease high-grade right ICA stenosis status post carotid angioplasty 5/31.  Aspirin 81 mg daily and Brilinta 90 mg twice daily for CVA prophylaxis.  Patient had been cleared for Lovenox for DVT prophylaxis.  Dysphagia diet has been downgraded n.p.o. tube feeds follow-up speech therapy.  Blood pressure monitored and remained soft.  Crestor ongoing for hyperlipidemia.  CKD 3 latest creatinine 1.30.  On the morning of 01/02/2022 notified rapid response for respiratory distress with saturations in the 70s on room air.  Patient was arousable to noxious stimuli.  Placed on 2 L nasal cannula saturations 96%.  Patient remained tachypneic with respirations 35-40.  Chest x-ray showed perihilar airspace opacities likely edema although pneumonia not excluded.  Patient was placed on intravenous antibiotic therapy.  Patient was discharged to acute care service for ongoing monitoring and work-up.   Blood pressures were monitored on TID basis and remained soft and monitored  Diabetes has been monitored with ac/hs CBG checks and SSI was use prn for tighter BS control.     Rehab course: During patient's stay in rehab weekly team conferences were held to monitor patient's progress, set goals and discuss barriers to discharge. At  admission, patient required moderate assist supine to sit mod assist sit to supine  He/She  has had improvement in activity tolerance, balance, postural control as well as ability to compensate for deficits. He/She has had improvement in functional use RUE/LUE  and RLE/LLE as well as improvement in awareness.  Required total assist for all mobility with sessions being variable.  Stand pivot transfer to TIS wheelchair with total assist.  Total assist of 3 to sliding board transfer.  Speech therapy continue to follow for dysphagia.       Disposition: Discharged to acute care services.    Diet: Currently n.p.o.  Special Instructions: As dictated per hospitalist team  30-35 minutes were spent completing discharge summary and discharge planning      Signed: Cathlyn Parsons 01/24/2022, 7:36 AM

## 2022-01-15 NOTE — Consult Note (Signed)
Referring Provider: [facility] Primary Care Physician:  [name], MD Primary Gastroenterologist: [facility]  Reason for Consultation:  Anemia, melena  HPI: Chad [name] is a 88 y.o. male with history of hypertension recently admitted for stroke with left-sided hemiplegia underwent balloon angioplasty for severe stenosis of internal carotid artery.  Prior to admission patient was in CIR. Patient admitted for respiratory distress, chest x-ray with findings consistent with possible aspiration pneumonia.   Today he was intubated due to respiratory failure.  He was recently started on tube feeding 5 days ago after failing swallow test.  Diet NPO.  This afternoon nurse reported large melanotic stool.  Patient's hemoglobin this afternoon 5.6 from 8.0 yesterday.  Baseline seems around 10.  Lactic acid 4.  Daughter with patient at bedside.  Daughter unsure if patient has had previous colonoscopy.  Denies EGD.  Notes patient would use ibuprofen on occasion but not frequently.  Denies alcohol use, smoking, illicit drugs.  Denies family history of colon cancer.  Daughter denies patient having melena or hematochezia in the past.  Denies history of GI bleed.  Past Medical History:  Diagnosis Date   Arthritis    Heart murmur    asa child    Hypertension    Stroke [facility])     Past Surgical History:  Procedure Laterality Date   EYE SURGERY Bilateral 2022   IR ANGIO INTRA EXTRACRAN SEL COM CAROTID INNOMINATE UNI R MOD SED  01/01/2022   IR ANGIOGRAM EXTREMITY RIGHT  12/31/2021   IR CT HEAD LTD  01/01/2022   IR PTA INTRACRANIAL  01/01/2022   JOINT REPLACEMENT     RADIOLOGY WITH ANESTHESIA N/A 01/01/2022   Procedure: Angiogram;  Surgeon: Luanne Bras, MD;  Location: [facility];  Service: Radiology;  Laterality: N/A;   TONSILLECTOMY     TOTAL KNEE ARTHROPLASTY Right 03/30/2013   Procedure: RIGHT TOTAL KNEE ARTHROPLASTY;  Surgeon: Tobi Bastos, MD;  Location: [facility];  Service: Orthopedics;  Laterality:  Right;   TOTAL KNEE ARTHROPLASTY Left 10/09/2020   Procedure: TOTAL KNEE ARTHROPLASTY;  Surgeon: Paralee Cancel, MD;  Location: [facility];  Service: Orthopedics;  Laterality: Left;  70 mins    Prior to Admission medications   Medication Sig Start Date End Date Taking? Authorizing Provider  amLODipine (NORVASC) 5 MG tablet Take 5 mg by mouth daily. 11/21/21   [provider]  aspirin 81 MG chewable tablet Chew 1 tablet (81 mg total) by mouth daily. 01/03/22   Shelly Coss, MD  atenolol (TENORMIN) 100 MG tablet Take 100 mg by mouth every morning.    [provider]  docusate sodium (COLACE) 100 MG capsule Take 1 capsule (100 mg total) by mouth 2 (two) times daily. Patient not taking: Reported on 12/29/2021 10/10/20   Irving Copas, PA-C  ferrous sulfate 325 (65 FE) MG tablet Take 1 tablet (325 mg total) by mouth 2 (two) times daily with a meal for 14 days. 10/10/20 10/24/20  Irving Copas, PA-C  HYDROcodone-acetaminophen (NORCO) 7.5-325 MG tablet Take 1 tablet by mouth 3 (three) times daily as needed for moderate pain. 11/25/21   [provider]  rosuvastatin (CRESTOR) 20 MG tablet Take 1 tablet (20 mg total) by mouth daily. 01/03/22   Shelly Coss, MD  tamsulosin (FLOMAX) 0.4 MG CAPS capsule Take 0.4 mg by mouth daily.    [provider]  ticagrelor (BRILINTA) 90 MG TABS tablet Take 1 tablet (90 mg total) by mouth 2 (two) times daily. 01/02/22  Shelly Coss, MD    Scheduled Meds:  [START ON 01/16/2022] amLODipine  5 mg Per Tube Daily   [START ON 01/16/2022] atenolol  100 mg Per Tube q morning   Chlorhexidine Gluconate Cloth  6 each Topical Q0600   docusate  100 mg Per Tube BID   insulin aspart  0-15 Units Subcutaneous Q4H   mouth rinse  15 mL Mouth Rinse Q2H   pantoprazole (PROTONIX) IV  40 mg Intravenous Q12H   [START ON 01/16/2022] polyethylene glycol  17 g Per Tube Daily   [START ON 01/16/2022] rosuvastatin  20 mg Per Tube Daily   Continuous  Infusions:  ampicillin-sulbactam (UNASYN) IV 3 g (01/23/2022 1159)   fentaNYL infusion INTRAVENOUS 25 mcg/hr (01/22/2022 1300)   lactated ringers 100 mL/hr at 01/11/2022 1157   PRN Meds:.acetaminophen **OR** acetaminophen, fentaNYL, midazolam  Allergies as of 01/03/2022   (No Known Allergies)    History reviewed. No pertinent family history.  Social History   Socioeconomic History   Marital status: Widowed    Spouse name: Not on file   Number of children: Not on file   Years of education: Not on file   Highest education level: Not on file  Occupational History   Not on file  Tobacco Use   Smoking status: Never   Smokeless tobacco: Never  Vaping Use   Vaping Use: Never used  Substance and Sexual Activity   Alcohol use: No   Drug use: No   Sexual activity: Not on file  Other Topics Concern   Not on file  Social History Narrative   Not on file   Social Determinants of Health   Financial Resource Strain: Not on file  Food Insecurity: Not on file  Transportation Needs: Not on file  Physical Activity: Not on file  Stress: Not on file  Social Connections: Not on file  Intimate Partner Violence: Not on file    Review of Systems: Review of Systems  Unable to perform ROS: Intubated  Constitutional:  Positive for malaise/fatigue. Negative for chills and fever.  Eyes:  Negative for blurred vision and double vision.  Cardiovascular:  Negative for chest pain and palpitations.  Gastrointestinal:  Positive for blood in stool, diarrhea and melena. Negative for abdominal pain, constipation, heartburn, nausea and vomiting.  Genitourinary:  Negative for dysuria and urgency.  Musculoskeletal:  Negative for myalgias and neck pain.  Skin:  Negative for itching and rash.  Neurological:  Negative for dizziness and headaches.  Endo/Heme/Allergies:  Negative for environmental allergies. Does not bruise/bleed easily.  Psychiatric/Behavioral:  Negative for depression and substance abuse.     Marland Kitchen  Physical Exam:Physical Exam Constitutional:      General: He is not in acute distress.    Appearance: He is normal weight. He is ill-appearing.  HENT:     Head: Normocephalic and atraumatic.     Right Ear: External ear normal.     Left Ear: External ear normal.     Nose: Nose normal.     Mouth/Throat:     Mouth: Mucous membranes are moist.  Eyes:     Pupils: Pupils are equal, round, and reactive to light.     Comments: Conjunctival pallor  Cardiovascular:     Rate and Rhythm: Normal rate and regular rhythm.     Pulses: Normal pulses.     Heart sounds: Normal heart sounds.  Pulmonary:     Comments: Patient intubated, right lung sounds coarse Abdominal:     General: Abdomen is  flat. Bowel sounds are normal. There is no distension.     Palpations: Abdomen is soft. There is no mass.     Tenderness: There is no abdominal tenderness. There is no guarding or rebound.     Hernia: No hernia is present.     Comments: Area of ecchymosis and firmness in right upper quadrant  Musculoskeletal:        General: Normal range of motion.     Cervical back: Normal range of motion and neck supple.  Skin:    General: Skin is warm and dry.     Coloration: Skin is pale.  Neurological:     General: No focal deficit present.     Mental Status: He is oriented to person, place, and time. Mental status is at baseline.  Psychiatric:        Mood and Affect: Mood normal.        Behavior: Behavior normal.     Vital signs: Vitals:   01/25/2022 1505 01/16/2022 1515  BP: (!) 130/56 124/62  Pulse: 72 69  Resp: (!) 25 (!) 21  Temp:  99.1 F (37.3 C)  SpO2: 100% 100%   Last BM Date : 01/13/2022    GI:  Lab Results: Recent Labs    01/31/2022 0703 01/19/2022 1118 01/21/2022 1202  WBC 14.2* 14.3*  --   HGB 8.0* 5.6* 6.1*  HCT 24.4* 17.2* 18.0*  PLT 298 278  --    BMET Recent Labs    01/05/2022 0703 01/24/2022 1118 01/21/2022 1202  NA 135 138 136  K 6.7* 6.3* 5.7*  CL 104 105  --   CO2 18* 22   --   GLUCOSE 132* 169*  --   BUN 68* 68*  --   CREATININE 3.84* 3.86*  --   CALCIUM 8.7* 8.4*  --    LFT Recent Labs    01/02/2022 0703 01/28/2022 1118  PROT 6.7 5.9*  ALBUMIN 2.4* 2.1*  AST 27 25  ALT 15 14  ALKPHOS 58 53  BILITOT 0.6 0.6  BILIDIR <0.1  --   IBILI NOT CALCULATED  --    PT/INR No results for input(s): "LABPROT", "INR" in the last 72 hours.   Studies/Results: US RENAL  Result Date: 01/03/2022 CLINICAL DATA:  Renal dysfunction EXAM: RENAL / URINARY TRACT ULTRASOUND COMPLETE COMPARISON:  None Available. FINDINGS: Right Kidney: Renal measurements: 8.9 x 5 x 5 cm = volume: 115.2 mL. There is no hydronephrosis. There is increased cortical echogenicity. Left Kidney: Renal measurements: 10.3 x 6.2 x 5 cm = volume: 165.9 mL. There is no hydronephrosis. There is increased cortical echogenicity. Bladder: Foley catheter is seen in the bladder. Bladder is not adequately distended for evaluation. Other: 1.1 cm splenule is noted adjacent to the inferior margin of spleen. Left pleural effusion is seen. IMPRESSION: There is no hydronephrosis. Increased cortical echogenicity suggests medical renal disease. Left pleural effusion. Electronically Signed   By: Elmer Picker M.D.   On: 01/14/2022 12:21   Portable Chest x-ray  Result Date: 01/11/2022 CLINICAL DATA:  Intubation EXAM: PORTABLE CHEST 1 VIEW COMPARISON:  01/25/2022, 3:13 a.m. FINDINGS: Interval endotracheal intubation, tip approximately 2 cm above the carina. Enteric feeding tube remains in position, partially imaged. Cardiomegaly. Diffuse bilateral heterogeneous and interstitial airspace opacity, unchanged. IMPRESSION: 1. Interval endotracheal intubation, tip approximately 2 cm above the carina. Enteric feeding tube remains in position, partially imaged. 2. Cardiomegaly. Diffuse bilateral heterogeneous and interstitial airspace opacity, unchanged. Electronically Signed   By: Lanae Crumbly  Laqueta Carina M.D.   On: 01/27/2022 10:39   DG Abd  1 View  Result Date: 01/06/2022 CLINICAL DATA:  88 year old male with abdominal pain and vomiting. EXAM: ABDOMEN - 1 VIEW COMPARISON:  01/10/2022. FINDINGS: Portable AP supine views at 0704 hours. Patient is mildly rotated to the left but the enteric tube tip is now in the gastric body. Extensive Calcified aortic atherosclerosis. Pelvic vascular calcifications. Progressive opacification of the visible left lung base, left hemidiaphragm now largely obscured. Decreased large bowel gas and borderline to mildly dilated gas-filled mid abdominal small bowel loops now. Spinal scoliosis and degeneration. No acute osseous abnormality identified. IMPRESSION: 1. Enteric tube tip now in the gastric body. 2. Decreased large bowel gas and borderline to mildly dilated mid abdominal small bowel loops. This could reflect ileus or developing small bowel obstruction. Follow-up abdominal radiographs may be valuable. 3. Progressive opacification at the left lung base since 01/10/2022, see portable chest x-ray 0313 hours today. Electronically Signed   By: Genevie Ann M.D.   On: 01/22/2022 07:37   DG Chest Port 1 View  Result Date: 01/11/2022 CLINICAL DATA:  Acute respiratory distress EXAM: PORTABLE CHEST 1 VIEW COMPARISON:  03/23/2022 FINDINGS: Cardiomegaly. Vascular congestion. Aortic atherosclerosis. Perihilar airspace opacities bilaterally. No effusions. No acute bony abnormality. IMPRESSION: Perihilar airspace opacities, likely edema although pneumonia not excluded. Electronically Signed   By: Rolm Baptise M.D.   On: 01/16/2022 03:29    Impression: Melena Anemia  Patient with 1 large melanotic stool this afternoon.  Drop in hemoglobin from 8.0-5.6.  Repeat hemoglobin 1 hour later 6.1.  1 unit packed red blood cells being given while in room.  BUN elevated at 68 baseline around 30.  Creatinine elevated at 3.86 from 1.3 on 01/12/2022 Potassium significantly elevated at 6.3. Lactic acid: 4.0  Patient is intubated with  coarse breath sounds.  Possible aspiration pneumonia with patient's history of failed SLP examination.  Patient may need improved respiratory status prior to EGD.  Abdominal x-ray 01/28/2022 Decreased large bowel gas borderline to mildly dilated mid abdominal small bowel loops possible ileus versus developing small bowel obstruction recommend repeat abdominal radiograph.  Plan: Agree with plan for 2 units packed red blood cell transfusion. Continue to monitor hemoglobin we will transfuse if less than 7 Agree with discontinuing aspirin, Brilinta, heparin Consider repeat abdominal x-ray tomorrow for possible ileus versus developing small bowel obstruction Continue Protonix 40 mg IV twice daily Tentatively plan for EGD Friday if patient is stable.   LOS: 0 days   Arvella Nigh Zurich Carreno  PA-C 01/16/2022, 3:30 PM  Contact #  [phone]

## 2022-01-15 NOTE — Consult Note (Addendum)
NAME:  Chad [name], MRN:  [id number], DOB:  June 26, 1932, LOS: 0 ADMISSION DATE:  01/14/2022, CONSULTATION DATE: 01/08/2022 REFERRING MD:  Nena Alexander MD, CHIEF COMPLAINT: Acute respiratory failure, aspiration    History of Present Illness:   88 year old with hypertension with recent admission for stroke, left hemiplegia status post balloon angioplasty of ICA.  He was transferred to rehab on 01/02/22.  Readmitted on 6/14 with respiratory distress, chest x-ray with bilateral infiltrates possible aspiration of tube feeds.  He was made n.p.o. and transferred to medical floor and placed on BiPAP.  PCCM consulted for help with management.  Pertinent  Medical History    has a past medical history of Arthritis, Heart murmur, Hypertension, and Stroke ([place]).   Significant Hospital Events: Including procedures, antibiotic start and stop dates in addition to other pertinent events   6/14 Admit from CIR  Interim History / Subjective:    Objective   Blood pressure (!) 165/70, pulse 65, temperature 98.8 F (37.1 C), temperature source Axillary, resp. rate (!) 32, SpO2 100 %.    FiO2 (%):  [50 %] 50 %  No intake or output data in the 24 hours ending 01/04/2022 0935 There were no vitals filed for this visit.  Examination: Gen:      Moderate distress, frail elderly cachectic HEENT:  EOMI, sclera anicteric Neck:     No masses; no thyromegaly Lungs:    Tachypnea with paradoxical breathing, use of accessory muscles, coarse rhonchi CV:         Regular rate and rhythm; no murmurs Abd:      + bowel sounds; soft, non-tender; no palpable masses, no distension Ext:    No edema; adequate peripheral perfusion Skin:      Warm and dry; no rash Neuro: Unresponsive  Labs/imaging reviewed Significant for ABG showing pH 7.52, PCO2 25, PO2 53 Potassium 6.7, creatinine 3.84, WBC 14.2, hemoglobin 8.0, platelets 298 Chest x-ray reviewed with new patchy bibasal opacities  Resolved Hospital Problem list      Assessment & Plan:  Acute hypoxic respiratory failure, aspiration pneumonia He will need intubation due to severe respiratory distress BiPAP not an option as he is aspirating tube feeds Started Unasyn for antibiotic coverage Follow chest x-ray, ABG  AKI on CKD stage IIIa secondary to ATN, hyperkalemia Received Lokelma, Na bicarb, Insulin dextrose Place foley as he has 700cc retention Start IV fluids  Acute CVA Left hemiplegia Neuro monitoring  Chronic HFpEF HTN Hold Lasix for now.  Hydrate as above.  DM SSI coverage  Goals of care Tried to have a discussion with daughter at bedside but she is too overwhelmed to make any decisions  He is full code for now. Palliative care has been consulted  Best Practice (right click and "Reselect all SmartList Selections" daily)   Diet/type: NPO DVT prophylaxis: prophylactic heparin  GI prophylaxis: PPI Lines: N/A Foley:  Yes, and it is still needed Code Status:  full code Last date of multidisciplinary goals of care discussion []   Critical care time:    The patient is critically ill with multiple organ system failure and requires high complexity decision making for assessment and support, frequent evaluation and titration of therapies, advanced monitoring, review of radiographic studies and interpretation of complex data.   Critical Care Time devoted to patient care services, exclusive of separately billable procedures, described in this note is 35 minutes.   Marshell Garfinkel MD [facility] See Amion for pager  If no response to  pager , please call [phone] until 7pm After 7:00 pm call Elink  [phone] 01/20/2022, 10:17 AM

## 2022-01-15 NOTE — Progress Notes (Addendum)
Addendum to earlier RRRN note: Pt transferred to 4E22 from 4W03 via bed and oxygen. Pt remains tachypneic RR 35-40. Pt attached to monitors and placed on BIPAP 10/5 with Tv 380-400cc. RR 35-40. Sats 95% on 80% FiO2.  Prior to transfer, NTS x2 performed again with copious amounts of thick tan secretions.

## 2022-01-15 NOTE — Procedures (Signed)
Intubation Procedure Note  NASEAN ZAPF  [id number]  Apr 25, 1932  Date:01/14/2022  Time:10:29 AM   Provider Performing:Emmette Katt    Procedure: Intubation (31500)  Indication(s) Respiratory Failure  Consent Risks of the procedure as well as the alternatives and risks of each were explained to the patient and/or caregiver.  Consent for the procedure was obtained and is signed in the bedside chart   Anesthesia Etomidate, Versed, Fentanyl, and Rocuronium   Time Out Verified patient identification, verified procedure, site/side was marked, verified correct patient position, special equipment/implants available, medications/allergies/relevant history reviewed, required imaging and test results available.   Sterile Technique Usual hand hygeine, masks, and gloves were used   Procedure Description Patient positioned in bed supine.  Sedation given as noted above.  Patient was intubated with endotracheal tube using Glidescope.  View was Grade 1 full glottis .  Number of attempts was 1.  Colorimetric CO2 detector was consistent with tracheal placement.   Complications/Tolerance None; patient tolerated the procedure well. Chest X-ray is ordered to verify placement.   EBL Minimal   Specimen(s) None  Marshell Garfinkel MD [facility] See Amion for pager  If no response to pager , please call [phone] until 7pm After 7:00 pm call Elink  [phone] 01/03/2022, 10:29 AM

## 2022-01-15 NOTE — Progress Notes (Signed)
PCCM Interval Progress Note  Messaged by RN regarding K 6.3 and Lactate 4.0.  1L LR bolus. 1 amp Dextrose, 10u insulin, 1g Ca gluconate, 10g Lokelma. Repeat Lactate and BMP this PM.    Montey Hora, PA - C [facility] For pager details, please see AMION or use Epic chat  After 1900, please call [place] for cross coverage needs 01/23/2022, 1:34 PM

## 2022-01-15 NOTE — Plan of Care (Signed)
Discharged to acute with resp distress; unable to complete rehab program

## 2022-01-15 NOTE — Consult Note (Signed)
Consultation Note Date: 01/16/2022   Patient Name: Chad [name]  DOB: [birth year]  MRN: [id number]  Age / Sex: 88 y.o., male  PCP: [name], MD Referring Physician: Marshell Garfinkel, MD  Reason for Consultation: Establishing goals of care  HPI/Patient Profile: 88 y.o. male  with past medical history of HTN, CKD, hld, and chronic pain admitted on 01/08/2022 with respiratory distress.  Patient initially admitted 5/29 with stroke and underwent balloon angioplasty of ICA.  He was transferred to CIR on 6/1.  During CIR admission his swallowing worsened and he had a feeding tube placed 6/9.  On 6/14 he developed respiratory distress and required readmission to the hospital with eventual transfer to ICU and intubation.  Patient also with a AKI and hyperkalemia.  Also with hemoglobin of 6.1.  GI consulted.  Found to have lactic acid of 4.  PMT consulted to discuss goals of care.  Clinical Assessment and Goals of Care: I have reviewed medical records including EPIC notes, labs and imaging, received report from RN and Dr. Vaughan Browner, assessed the patient and then met with patient's daughter April to discuss diagnosis prognosis, GOC, EOL wishes, disposition and options.  I introduced Palliative Medicine as specialized medical care for people living with serious illness. It focuses on providing relief from the symptoms and stress of a serious illness. The goal is to improve quality of life for both the patient and the family.  Daughter shares that current situation is incredibly shocking.  Prior to patient's stroke he had never been hospitalized and was independent at home in good health.  She tells me of some high blood pressure and mild kidney issues but overall doing quite well for 75 years old.  Daughter tells me she is the only one that lives locally, she does have other siblings that are out of town however they are all flying in.  Daughter describes  patient has a very strong man.   We discussed patient's current illness and what it means in the larger context of patient's on-going co-morbidities.  Natural disease trajectory and expectations at EOL were discussed.  We discussed his stroke and uncertain what his new baseline will be.  We discussed ongoing aspiration issues and dysphagia that worsened during his rehab stay.  We discussed AKI as well as possible GI bleed.  We discussed his sepsis and pneumonia requiring ventilator support.  I reviewed current interventions for his multiple medical issues.  We discussed allowing time for outcomes in continuing current medical interventions.  Encouraged daughter to consider DNR status understanding evidenced based poor outcomes in similar hospitalized patients, as the cause of the arrest is likely associated with chronic/terminal disease rather than a reversible acute cardio-pulmonary event.  Daughter is uncertain about CODE STATUS and initially states she believes her father would want CPR but she is uncertain.  They have a physician in the family and she plans to further discuss with him and her siblings before making decision.  Discussed with April the importance of continued conversation with family and the medical providers regarding overall plan of care and treatment options, ensuring decisions are within the context of the patients values and GOCs.    Questions and concerns were addressed. The family was encouraged to call with questions or concerns.  Primary Decision Maker NEXT OF KIN -daughter April    SUMMARY OF RECOMMENDATIONS   Initial goals of care discussions Continue current measures and allow time for outcomes Full code PMT will follow  Code Status/Advance Care Planning:  Full code      Primary Diagnoses: Present on Admission:  Acute respiratory failure with hypoxia ([place])   I have reviewed the medical record, interviewed the patient and family, and examined the patient.  The following aspects are pertinent.  Past Medical History:  Diagnosis Date   Arthritis    Heart murmur    asa child    Hypertension    Stroke [facility])    Social History   Socioeconomic History   Marital status: Widowed    Spouse name: Not on file   Number of children: Not on file   Years of education: Not on file   Highest education level: Not on file  Occupational History   Not on file  Tobacco Use   Smoking status: Never   Smokeless tobacco: Never  Vaping Use   Vaping Use: Never used  Substance and Sexual Activity   Alcohol use: No   Drug use: No   Sexual activity: Not on file  Other Topics Concern   Not on file  Social History Narrative   Not on file   Social Determinants of Health   Financial Resource Strain: Not on file  Food Insecurity: Not on file  Transportation Needs: Not on file  Physical Activity: Not on file  Stress: Not on file  Social Connections: Not on file   History reviewed. No pertinent family history. Scheduled Meds:  sodium chloride   Intravenous Once   [START ON 01/16/2022] amLODipine  5 mg Per Tube Daily   [START ON 01/16/2022] aspirin  81 mg Per Tube Daily   [START ON 01/16/2022] atenolol  100 mg Per Tube q morning   Chlorhexidine Gluconate Cloth  6 each Topical Q0600   docusate  100 mg Per Tube BID   heparin injection (subcutaneous)  5,000 Units Subcutaneous Q8H   mouth rinse  15 mL Mouth Rinse Q2H   pantoprazole (PROTONIX) IV  40 mg Intravenous QHS   [START ON 01/16/2022] polyethylene glycol  17 g Per Tube Daily   [START ON 01/16/2022] rosuvastatin  20 mg Per Tube Daily   ticagrelor  90 mg Per Tube BID   Continuous Infusions:  ampicillin-sulbactam (UNASYN) IV 3 g (01/10/2022 1159)   fentaNYL infusion INTRAVENOUS 25 mcg/hr (01/08/2022 1300)   lactated ringers     lactated ringers 100 mL/hr at 01/19/2022 1157   PRN Meds:.acetaminophen **OR** acetaminophen, fentaNYL, midazolam No Known Allergies Review of Systems  Unable to perform ROS:  Intubated  All other systems reviewed and are negative.   Physical Exam Constitutional:      Comments: Intubated and sedated  Pulmonary:     Comments: Remains on ventilator support    Vital Signs: BP 130/64   Pulse 70   Temp 97.7 F (36.5 C) (Oral)   Resp 20   SpO2 100%          SpO2: SpO2: 100 % O2 Device:SpO2: 100 % O2 Flow Rate: .O2 Flow Rate (L/min): 4 L/min  IO: Intake/output summary:  Intake/Output Summary (Last 24 hours) at 01/25/2022 1504 Last data filed at 01/13/2022 1100 Gross per 24 hour  Intake 244.85 ml  Output 850 ml  Net -605.15 ml    LBM: Last BM Date : 01/23/2022 Baseline Weight:   Most recent weight:       Palliative Assessment/Data: PPS 30%     *Please note that this is a verbal dictation therefore any spelling or grammatical errors are due to the "[facility]" system interpretation.  Darol Destine  Charyl Bigger, DNP, AGNP-C Palliative Medicine Team [phone] Pager: [phone]

## 2022-01-15 NOTE — Progress Notes (Signed)
4E called to receive report. Pt transported to [facility] Family member April was notified.

## 2022-01-15 NOTE — H&P (Signed)
History and Physical    Chad [name] [id number] DOB: 03/19/1932 DOA: 01/09/2022  PCP: [name], MD  Patient coming from: Patient was transferred from rehab.  Chief Complaint: Shortness of breath.  HPI: Chad [name] is a 88 y.o. male with history of hypertension recently admitted for stroke with left-sided hemiplegia underwent balloon angioplasty for severe stenosis of internal carotid artery eventually transferred to rehab was placed on feeding tube about 5 days ago since patient failed swallow was found to be in acute respiratory distress this morning and chest x-ray was done showing possible edema versus infiltrates.  Rapid response was called and patient had suction done with suction the fluid looks consistency of the feeding.  Feeding was stopped.  Patient transferred to medical floor placed on BiPAP.  On my exam patient is tachypneic but not in distress.  Following commands.  ED Course: Patient is a direct transfer from rehab.  Review of Systems: As per HPI, rest all negative.   Past Medical History:  Diagnosis Date   Arthritis    Heart murmur    asa child    Hypertension    Stroke [facility])     Past Surgical History:  Procedure Laterality Date   EYE SURGERY Bilateral 2022   IR ANGIO INTRA EXTRACRAN SEL COM CAROTID INNOMINATE UNI R MOD SED  01/01/2022   IR ANGIOGRAM EXTREMITY RIGHT  12/31/2021   IR CT HEAD LTD  01/01/2022   IR PTA INTRACRANIAL  01/01/2022   JOINT REPLACEMENT     RADIOLOGY WITH ANESTHESIA N/A 01/01/2022   Procedure: Angiogram;  Surgeon: Luanne Bras, MD;  Location: [place];  Service: Radiology;  Laterality: N/A;   TONSILLECTOMY     TOTAL KNEE ARTHROPLASTY Right 03/30/2013   Procedure: RIGHT TOTAL KNEE ARTHROPLASTY;  Surgeon: Tobi Bastos, MD;  Location: [facility];  Service: Orthopedics;  Laterality: Right;   TOTAL KNEE ARTHROPLASTY Left 10/09/2020   Procedure: TOTAL KNEE ARTHROPLASTY;  Surgeon: Paralee Cancel, MD;  Location: [facility];  Service:  Orthopedics;  Laterality: Left;  70 mins     reports that he has never smoked. He has never used smokeless tobacco. He reports that he does not drink alcohol and does not use drugs.  No Known Allergies  History reviewed. No pertinent family history.  Prior to Admission medications   Medication Sig Start Date End Date Taking? Authorizing Provider  amLODipine (NORVASC) 5 MG tablet Take 5 mg by mouth daily. 11/21/21   [provider]  aspirin 81 MG chewable tablet Chew 1 tablet (81 mg total) by mouth daily. 01/03/22   Shelly Coss, MD  atenolol (TENORMIN) 100 MG tablet Take 100 mg by mouth every morning.    [provider]  docusate sodium (COLACE) 100 MG capsule Take 1 capsule (100 mg total) by mouth 2 (two) times daily. Patient not taking: Reported on 12/29/2021 10/10/20   Irving Copas, PA-C  ferrous sulfate 325 (65 FE) MG tablet Take 1 tablet (325 mg total) by mouth 2 (two) times daily with a meal for 14 days. 10/10/20 10/24/20  Irving Copas, PA-C  HYDROcodone-acetaminophen (NORCO) 7.5-325 MG tablet Take 1 tablet by mouth 3 (three) times daily as needed for moderate pain. 11/25/21   [provider]  rosuvastatin (CRESTOR) 20 MG tablet Take 1 tablet (20 mg total) by mouth daily. 01/03/22   Shelly Coss, MD  tamsulosin (FLOMAX) 0.4 MG CAPS capsule Take 0.4 mg by mouth daily.    [provider]  ticagrelor (  BRILINTA) 90 MG TABS tablet Take 1 tablet (90 mg total) by mouth 2 (two) times daily. 01/02/22   Shelly Coss, MD    Physical Exam: Constitutional: Moderately built and nourished.  Blood pressure is 160/80 pulse is 63/min Vitals:   01/16/2022 0555 01/27/2022 0600  BP:  (!) 165/70  Pulse: 67 63  Resp: (!) 36 (!) 32  Temp:  98.8 F (37.1 C)  TempSrc:  Axillary  SpO2: 97% 95%   Eyes: Anicteric no pallor. ENMT: No discharge from the ears eyes nose and mouth. Neck: No mass felt.  No neck rigidity. Respiratory: No rhonchi coarse crepitations  present. Cardiovascular: S1-S2 heard. Abdomen: Soft nontender bowel sound present. Musculoskeletal: No edema. Skin: No rash. Neurologic: Alert awake following commands left-sided weakness from stroke. Psychiatric: Alert and awake.   Labs on Admission: I have personally reviewed following labs and imaging studies  CBC: No results for input(s): "WBC", "NEUTROABS", "HGB", "HCT", "MCV", "PLT" in the last 168 hours. Basic Metabolic Panel: Recent Labs  Lab 01/09/22 0528 01/10/22 1831 01/11/22 0515 01/11/22 1650 01/12/22 0522  NA  --   --   --   --  146*  K  --   --   --   --  3.3*  CL  --   --   --   --  116*  CO2  --   --   --   --  22  GLUCOSE  --   --   --   --  134*  BUN  --   --   --   --  30*  CREATININE 1.46*  --   --   --  1.30*  CALCIUM  --   --   --   --  8.3*  MG  --  2.1 2.0 1.9 1.9  PHOS  --  3.3 2.9 2.2* 1.8*   GFR: Estimated Creatinine Clearance: 29.1 mL/min (A) (by C-G formula based on SCr of 1.3 mg/dL (H)). Liver Function Tests: No results for input(s): "AST", "ALT", "ALKPHOS", "BILITOT", "PROT", "ALBUMIN" in the last 168 hours. No results for input(s): "LIPASE", "AMYLASE" in the last 168 hours. No results for input(s): "AMMONIA" in the last 168 hours. Coagulation Profile: No results for input(s): "INR", "PROTIME" in the last 168 hours. Cardiac Enzymes: No results for input(s): "CKTOTAL", "CKMB", "CKMBINDEX", "TROPONINI" in the last 168 hours. BNP (last 3 results) No results for input(s): "PROBNP" in the last 8760 hours. HbA1C: No results for input(s): "HGBA1C" in the last 72 hours. CBG: Recent Labs  Lab 01/14/22 1129 01/14/22 1604 01/14/22 2012 01/02/2022 0007 01/25/2022 0346  GLUCAP 133* 129* 151* 147* 137*   Lipid Profile: No results for input(s): "CHOL", "HDL", "LDLCALC", "TRIG", "CHOLHDL", "LDLDIRECT" in the last 72 hours. Thyroid Function Tests: No results for input(s): "TSH", "T4TOTAL", "FREET4", "T3FREE", "THYROIDAB" in the last 72  hours. Anemia Panel: No results for input(s): "VITAMINB12", "FOLATE", "FERRITIN", "TIBC", "IRON", "RETICCTPCT" in the last 72 hours. Urine analysis:    Component Value Date/Time   COLORURINE YELLOW 12/29/2021 2233   APPEARANCEUR CLEAR 12/29/2021 2233   LABSPEC 1.011 12/29/2021 2233   PHURINE 5.0 12/29/2021 2233   GLUCOSEU NEGATIVE 12/29/2021 2233   HGBUR SMALL (A) 12/29/2021 2233   BILIRUBINUR NEGATIVE 12/29/2021 2233   KETONESUR NEGATIVE 12/29/2021 2233   PROTEINUR 30 (A) 12/29/2021 2233   UROBILINOGEN 0.2 03/23/2013 0908   NITRITE NEGATIVE 12/29/2021 2233   LEUKOCYTESUR NEGATIVE 12/29/2021 2233   Sepsis Labs: @LABRCNTIP (procalcitonin:4,lacticidven:4) )No results found for this  or any previous visit (from the past 240 hour(s)).   Radiological Exams on Admission: DG Chest Port 1 View  Result Date: 01/10/2022 CLINICAL DATA:  Acute respiratory distress EXAM: PORTABLE CHEST 1 VIEW COMPARISON:  03/23/2022 FINDINGS: Cardiomegaly. Vascular congestion. Aortic atherosclerosis. Perihilar airspace opacities bilaterally. No effusions. No acute bony abnormality. IMPRESSION: Perihilar airspace opacities, likely edema although pneumonia not excluded. Electronically Signed   By: Rolm Baptise M.D.   On: 01/31/2022 03:29      Assessment/Plan Principal Problem:   Acute respiratory failure with hypoxia (HCC)    Acute respiratory failure with hypoxia presently on BiPAP differentials include possible aspiration versus fluid overload.  For now I ordered 1 dose of Lasix 40 mg IV and will keep patient on antibiotics for possible aspiration holding feeding for now check KUB to make sure there is no ileus or obstruction.  If there is no obstruction or ileus then we will restart feeding patient was getting Osmolite 1.5 at a reduced dose. Stroke status post angioplasty on Brilinta and aspirin and statins which will be continued. Hypertension on amlodipine atenolol. Chronic kidney disease stage III repeat  metabolic panel is pending. Hypernatremia during recent labs repeat labs pending.  Was getting free water.  Will resume free water if there is no obstruction seen in the x-rays.  All labs and KUB is pending.  Extensively discussed with patient's daughter April and also pharmacist.  And nurse.   DVT prophylaxis: Lovenox. Code Status: Full code. Family Communication: Patient's daughter. Disposition Plan: Back to rehab once stable. Consults called: None. Admission status: Inpatient.   Rise Patience MD [facility] Pager [phone].  If 7PM-7AM, please contact night-coverage [email or web address] Password [facility]  01/27/2022, 6:49 AM

## 2022-01-15 NOTE — Progress Notes (Signed)
Inpatient Rehabilitation Discharge Medication Review by a Pharmacist  A complete drug regimen review was completed for this patient to identify any potential clinically significant medication issues.  High Risk Drug Classes Is patient taking? Indication by Medication  Antipsychotic No   Anticoagulant Yes Enoxaparin - VTE prophylaxis  Antibiotic No   Opioid Yes Prn Norco - pain  Antiplatelet Yes Aspirin and Brilinta - CVA and recent stent  Hypoglycemics/insulin No   Vasoactive Medication Yes Amlodipine, Atenolol - HTN  Chemotherapy No   Other No Tamsulosin - BPH Crestor - hyperlipidemia     Type of Medication Issue Identified Description of Issue Recommendation(s)  Drug Interaction(s) (clinically significant)     Duplicate Therapy     Allergy     No Medication Administration End Date     Incorrect Dose     Additional Drug Therapy Needed     Significant med changes from prior encounter (inform family/care partners about these prior to discharge).    Other       Clinically significant medication issues were identified that warrant physician communication and completion of prescribed/recommended actions by midnight of the next day:  No  Pharmacist comments:     Transferred from CIR to inpatient unit.     Unasyn begun on inpatient unit for aspiration pneumonia.     Meds are now per tube, Tamsulosin held since cannot be given per tube.    Enoxaparin for VTE prophylaxis changed to SQ heparin due to AKI.  Time spent performing this drug regimen review (minutes):  20   Arty Baumgartner, [place] 01/28/2022 11:09 AM

## 2022-01-15 NOTE — Progress Notes (Signed)
Inpatient Rehabilitation Care Coordinator Discharge Note   Patient Details  Name: Chad [name] MRN: [id number] Date of Birth: March 11, 1932   Discharge location: [place]  Length of Stay:    Discharge activity level:    Home/community participation:    Patient response UV:[id number] Literacy - How often do you need to have someone help you when you read instructions, pamphlets, or other written material from your doctor or pharmacy?: Patient unable to respond  Patient response QI:[id number] Isolation - How often do you feel lonely or isolated from those around you?: Patient unable to respond  Services provided included:    Financial Services:  Charity fundraiser Utilized: [employer]  Choices offered to/list presented to:    Follow-up services arranged:              Patient response to transportation need: Is the patient able to respond to transportation needs?: Yes In the past 12 months, has lack of transportation kept you from medical appointments or from getting medications?: No In the past 12 months, has lack of transportation kept you from meetings, work, or from getting things needed for daily living?: No    Comments (or additional information):  Patient/Family verbalized understanding of follow-up arrangements:  Yes  Individual responsible for coordination of the follow-up plan: April  Confirmed correct DME delivered: Dyanne Iha 01/20/2022    Dyanne Iha

## 2022-01-15 NOTE — Significant Event (Addendum)
Rapid Response Event Note   Reason for Call : respiratory distress/hypoxia Initial Focused Assessment:  Notified by RT regarding pt in respiratory distress with sats in the 70s on RA. Pt arousable to noxious stimuli (baseline oriented to self), moderate distress with abdominal accessory muscle use. BBS coarse rhonchi with thick secretions. Skin is warm, pink and diaphoretic. Pt was NTS x2 with success- thick tan secretions. Pt placed on 2L [place] with sats 96%. PCXR done. Pt remains tachypneic with RR 35-40 but with less WOB. May need to escalate to BIPAP if WOB/tachypnea does not improve. No need for ABG at current time as hypoxia was quickly resolved with suctioning.   0325-97.80F, HR 73 RRR, 157/74, RR 40 (after suctioning) with sats 96% on 2L [place].  0330- RR improving 32, pt responding to questions.  Interventions:  -Stat PCXR -NTS x 2  Plan of Care:  -Recheck VS at 0400 -May need BIPAP if tachypnea/WOB  does not improve.  -Notify primary MD for further orders -Notify RRRN if further assistance needed.    MD Notified: per primary RN Call Time: [place] Time: 0304 End Time: 0335  Madelynn Done, RN

## 2022-01-15 NOTE — Progress Notes (Signed)
RN called this RT to patient's room. RN stated that patient did not sound good and was having trouble breathing.  Went into room and patient sound coarse, had increased WOB.  Patient vitals were gathered and patient sat was in 57s.  RN stopped tube feeding.  Called Rapid Response RN to bedside.  CT was ordered and NT suction performed.  Was able to get some tan and white, thick secretions out and sats started to improve but patient still has some increased WOB, but is better and on 2L [place] with a current sat of 95%.

## 2022-01-15 NOTE — Progress Notes (Signed)
Nurse called respiratory due to pt having trouble breathing and sounding very congested. Spo2 in 70s. Rapid response called. Chest xray acquired. RT Suctioned pt with spo2 improving to 90s. Remaining in 90s with 2L o2. Continues to have tachypnea. On call provider Danella Sensing called and notified. New orders currently being placed.

## 2022-01-15 NOTE — Progress Notes (Signed)
Occupational Therapy Note  Patient Details  Name: Chad [name] MRN: [id number] Date of Birth: 1931-12-11  Occupational Therapy Discharge Note  This patient was unable to complete the inpatient rehab program due to transferring back to acute floor in setting of respiratory distress; therefore did not meet their long term goals. Pt left the program at a total A of 2 assist level for their  functional ADLs. This patient is being discharged from OT services at this time.  BIMS at time of d/c  Pt unable to complete due to medical status  See CareTool for functional status details.  If the patient is able to return to inpatient rehabilitation within 3 midnights, this may be considered an interrupted stay and therapy services will resume as ordered. Modification and reinstatement of their goals will be made upon completion of therapy service reevaluations.     Volanda Napoleon MS, OTR/L  01/16/2022, 12:37 PM

## 2022-01-16 ENCOUNTER — Inpatient Hospital Stay (HOSPITAL_COMMUNITY): Payer: Medicare Other

## 2022-01-16 ENCOUNTER — Other Ambulatory Visit (HOSPITAL_COMMUNITY): Payer: Self-pay

## 2022-01-16 DIAGNOSIS — R569 Unspecified convulsions: Secondary | ICD-10-CM

## 2022-01-16 DIAGNOSIS — I6930 Unspecified sequelae of cerebral infarction: Secondary | ICD-10-CM

## 2022-01-16 DIAGNOSIS — N179 Acute kidney failure, unspecified: Secondary | ICD-10-CM

## 2022-01-16 DIAGNOSIS — N1831 Chronic kidney disease, stage 3a: Secondary | ICD-10-CM | POA: Diagnosis present

## 2022-01-16 DIAGNOSIS — R6521 Severe sepsis with septic shock: Secondary | ICD-10-CM

## 2022-01-16 DIAGNOSIS — G8194 Hemiplegia, unspecified affecting left nondominant side: Secondary | ICD-10-CM

## 2022-01-16 DIAGNOSIS — E875 Hyperkalemia: Secondary | ICD-10-CM | POA: Diagnosis present

## 2022-01-16 DIAGNOSIS — A419 Sepsis, unspecified organism: Secondary | ICD-10-CM | POA: Diagnosis present

## 2022-01-16 DIAGNOSIS — E876 Hypokalemia: Secondary | ICD-10-CM

## 2022-01-16 DIAGNOSIS — E440 Moderate protein-calorie malnutrition: Secondary | ICD-10-CM | POA: Diagnosis present

## 2022-01-16 DIAGNOSIS — I639 Cerebral infarction, unspecified: Secondary | ICD-10-CM

## 2022-01-16 LAB — CBC WITH DIFFERENTIAL/PLATELET
Abs Immature Granulocytes: 0.88 10*3/uL — ABNORMAL HIGH (ref 0.00–0.07)
Basophils Absolute: 0 10*3/uL (ref 0.0–0.1)
Basophils Relative: 0 %
Eosinophils Absolute: 0 10*3/uL (ref 0.0–0.5)
Eosinophils Relative: 0 %
HCT: 27.5 % — ABNORMAL LOW (ref 39.0–52.0)
Hemoglobin: 9.7 g/dL — ABNORMAL LOW (ref 13.0–17.0)
Immature Granulocytes: 5 %
Lymphocytes Relative: 7 %
Lymphs Abs: 1.3 10*3/uL (ref 0.7–4.0)
MCH: 31.3 pg (ref 26.0–34.0)
MCHC: 35.3 g/dL (ref 30.0–36.0)
MCV: 88.7 fL (ref 80.0–100.0)
Monocytes Absolute: 5 10*3/uL — ABNORMAL HIGH (ref 0.1–1.0)
Monocytes Relative: 27 %
Neutro Abs: 11.1 10*3/uL — ABNORMAL HIGH (ref 1.7–7.7)
Neutrophils Relative %: 61 %
Platelets: 212 10*3/uL (ref 150–400)
RBC: 3.1 MIL/uL — ABNORMAL LOW (ref 4.22–5.81)
RDW: 17.1 % — ABNORMAL HIGH (ref 11.5–15.5)
WBC: 18.4 10*3/uL — ABNORMAL HIGH (ref 4.0–10.5)
nRBC: 0 % (ref 0.0–0.2)

## 2022-01-16 LAB — TYPE AND SCREEN
ABO/RH(D): A POS
Antibody Screen: NEGATIVE
Unit division: 0
Unit division: 0

## 2022-01-16 LAB — BASIC METABOLIC PANEL
Anion gap: 10 (ref 5–15)
Anion gap: 10 (ref 5–15)
Anion gap: 11 (ref 5–15)
BUN: 56 mg/dL — ABNORMAL HIGH (ref 8–23)
BUN: 56 mg/dL — ABNORMAL HIGH (ref 8–23)
BUN: 58 mg/dL — ABNORMAL HIGH (ref 8–23)
CO2: 21 mmol/L — ABNORMAL LOW (ref 22–32)
CO2: 23 mmol/L (ref 22–32)
CO2: 23 mmol/L (ref 22–32)
Calcium: 8.2 mg/dL — ABNORMAL LOW (ref 8.9–10.3)
Calcium: 8.3 mg/dL — ABNORMAL LOW (ref 8.9–10.3)
Calcium: 8.4 mg/dL — ABNORMAL LOW (ref 8.9–10.3)
Chloride: 104 mmol/L (ref 98–111)
Chloride: 105 mmol/L (ref 98–111)
Chloride: 108 mmol/L (ref 98–111)
Creatinine, Ser: 3.05 mg/dL — ABNORMAL HIGH (ref 0.61–1.24)
Creatinine, Ser: 3.12 mg/dL — ABNORMAL HIGH (ref 0.61–1.24)
Creatinine, Ser: 3.12 mg/dL — ABNORMAL HIGH (ref 0.61–1.24)
GFR, Estimated: 18 mL/min — ABNORMAL LOW (ref >60)
GFR, Estimated: 18 mL/min — ABNORMAL LOW (ref >60)
GFR, Estimated: 19 mL/min — ABNORMAL LOW (ref >60)
Glucose, Bld: 104 mg/dL — ABNORMAL HIGH (ref 70–99)
Glucose, Bld: 105 mg/dL — ABNORMAL HIGH (ref 70–99)
Glucose, Bld: 115 mg/dL — ABNORMAL HIGH (ref 70–99)
Potassium: 5.6 mmol/L — ABNORMAL HIGH (ref 3.5–5.1)
Potassium: 5.7 mmol/L — ABNORMAL HIGH (ref 3.5–5.1)
Potassium: >7.5 mmol/L (ref 3.5–5.1)
Sodium: 138 mmol/L (ref 135–145)
Sodium: 138 mmol/L (ref 135–145)
Sodium: 139 mmol/L (ref 135–145)

## 2022-01-16 LAB — GLUCOSE, CAPILLARY
Glucose-Capillary: 98 mg/dL (ref 70–99)
Glucose-Capillary: 88 mg/dL (ref 70–99)
Glucose-Capillary: 115 mg/dL — ABNORMAL HIGH (ref 70–99)
Glucose-Capillary: 108 mg/dL — ABNORMAL HIGH (ref 70–99)
Glucose-Capillary: 103 mg/dL — ABNORMAL HIGH (ref 70–99)
Glucose-Capillary: 113 mg/dL — ABNORMAL HIGH (ref 70–99)

## 2022-01-16 LAB — URINE CULTURE: Culture: NO GROWTH

## 2022-01-16 LAB — BPAM RBC
Blood Product Expiration Date: 202307062359
Blood Product Expiration Date: 202307062359
ISSUE DATE / TIME: 202306141500
ISSUE DATE / TIME: 202306141746
Unit Type and Rh: 6200
Unit Type and Rh: 6200

## 2022-01-16 LAB — COMPREHENSIVE METABOLIC PANEL
ALT: 13 U/L (ref 0–44)
AST: 22 U/L (ref 15–41)
Albumin: 1.8 g/dL — ABNORMAL LOW (ref 3.5–5.0)
Alkaline Phosphatase: 53 U/L (ref 38–126)
Anion gap: 13 (ref 5–15)
BUN: 58 mg/dL — ABNORMAL HIGH (ref 8–23)
CO2: 21 mmol/L — ABNORMAL LOW (ref 22–32)
Calcium: 8.3 mg/dL — ABNORMAL LOW (ref 8.9–10.3)
Chloride: 101 mmol/L (ref 98–111)
Creatinine, Ser: 3.24 mg/dL — ABNORMAL HIGH (ref 0.61–1.24)
GFR, Estimated: 18 mL/min — ABNORMAL LOW (ref >60)
Glucose, Bld: 97 mg/dL (ref 70–99)
Potassium: 5.7 mmol/L — ABNORMAL HIGH (ref 3.5–5.1)
Sodium: 135 mmol/L (ref 135–145)
Total Bilirubin: 0.5 mg/dL (ref 0.3–1.2)
Total Protein: 5.4 g/dL — ABNORMAL LOW (ref 6.5–8.1)

## 2022-01-16 LAB — CBC
HCT: 17.2 % — ABNORMAL LOW (ref 39.0–52.0)
Hemoglobin: 5.6 g/dL — CL (ref 13.0–17.0)
MCH: 28.1 pg (ref 26.0–34.0)
MCHC: 32.6 g/dL (ref 30.0–36.0)
MCV: 86.4 fL (ref 80.0–100.0)
Platelets: 278 10*3/uL (ref 150–400)
RBC: 1.99 MIL/uL — ABNORMAL LOW (ref 4.22–5.81)
RDW: 17.3 % — ABNORMAL HIGH (ref 11.5–15.5)
WBC: 14.3 10*3/uL — ABNORMAL HIGH (ref 4.0–10.5)
nRBC: 0 % (ref 0.0–0.2)

## 2022-01-16 LAB — PATHOLOGIST SMEAR REVIEW

## 2022-01-16 LAB — LACTIC ACID, PLASMA: Lactic Acid, Venous: 2.6 mmol/L (ref 0.5–1.9)

## 2022-01-16 MED ORDER — SODIUM ZIRCONIUM CYCLOSILICATE 10 G PO PACK
10.0000 g | PACK | Freq: Once | ORAL | Status: AC
Start: 2022-01-16 — End: 2022-01-16
  Administered 2022-01-16: 10 g via ORAL
  Filled 2022-01-16: qty 1

## 2022-01-16 MED ORDER — SODIUM ZIRCONIUM CYCLOSILICATE 10 G PO PACK: 10.0000 g | PACK | Freq: Once | ORAL | Status: DC

## 2022-01-16 MED ORDER — LORAZEPAM 2 MG/ML IJ SOLN
INTRAMUSCULAR | Status: AC
  Administered 2022-01-16: 2 mg via INTRAVENOUS
  Filled 2022-01-16: qty 1

## 2022-01-16 MED ORDER — CHLORHEXIDINE GLUCONATE CLOTH 2 % EX PADS
6.0000 | MEDICATED_PAD | Freq: Every day | CUTANEOUS | Status: DC
  Administered 2022-01-18 – 2022-01-26 (×10): 6 via TOPICAL

## 2022-01-16 MED ORDER — SODIUM ZIRCONIUM CYCLOSILICATE 10 G PO PACK
10.0000 g | PACK | Freq: Once | ORAL | Status: AC
  Administered 2022-01-16: 10 g
  Filled 2022-01-16: qty 1

## 2022-01-16 MED ORDER — LORAZEPAM 2 MG/ML IJ SOLN: 2.0000 mg | Freq: Once | INTRAMUSCULAR | Status: AC

## 2022-01-16 MED ORDER — SODIUM CHLORIDE 0.9 % IV SOLN
250.0000 mL | INTRAVENOUS | Status: DC
  Administered 2022-01-16 – 2022-01-23 (×2): 250 mL via INTRAVENOUS

## 2022-01-16 MED ORDER — ALBUMIN HUMAN 5 % IV SOLN
12.5000 g | Freq: Once | INTRAVENOUS | Status: AC
Start: 2022-01-16 — End: 2022-01-18
  Administered 2022-01-16: 12.5 g via INTRAVENOUS
  Filled 2022-01-16: qty 250

## 2022-01-16 MED ORDER — NOREPINEPHRINE 4 MG/250ML-% IV SOLN
0.0000 ug/min | INTRAVENOUS | Status: AC
  Administered 2022-01-16: 10 ug/min via INTRAVENOUS
  Administered 2022-01-16: 12 ug/min via INTRAVENOUS
  Administered 2022-01-16: 2 ug/min via INTRAVENOUS
  Administered 2022-01-17: 10 ug/min via INTRAVENOUS
  Filled 2022-01-16 (×4): qty 250

## 2022-01-16 MED ORDER — MIDODRINE HCL 5 MG PO TABS
10.0000 mg | ORAL_TABLET | Freq: Three times a day (TID) | ORAL | Status: DC
  Filled 2022-01-16: qty 2

## 2022-01-16 NOTE — Progress Notes (Addendum)
LTM hook up. No initial skin break down. Patient is monitored by [facility].

## 2022-01-16 NOTE — Procedures (Signed)
Patient Name: FABRICE DYAL  MRN: [id number]  Epilepsy Attending: Lora Havens  Referring Physician/Provider: Corey Harold, NP  Date: 01/16/2022  Duration: 21.43 mins  Patient history: 88 year old male with history of stroke had an episode of posturing right arm and left gaze.  EEG to evaluate for seizure.  Level of alertness:  lethargic   AEDs during EEG study: Ativan  Technical aspects: This EEG study was done with scalp electrodes positioned according to the 10-20 International system of electrode placement. Electrical activity was acquired at a sampling rate of 500Hz  and reviewed with a high frequency filter of 70Hz  and a low frequency filter of 1Hz . EEG data were recorded continuously and digitally stored.   Description: EEG showed continuous generalized and maximal left frontal 3- 6 Hz theta-delta slowing which at times appears sharply contoured. Hyperventilation and photic stimulation were not performed.     ABNORMALITY - Continuous slow, generalized  IMPRESSION: This study is suggestive of moderate to severe diffuse encephalopathy, nonspecific etiology. No seizures or epileptiform discharges were seen throughout the recording.  Blane Worthington Barbra Sarks

## 2022-01-16 NOTE — Progress Notes (Signed)
[place] Progress Note Patient Name: Chad [name] DOB: 1932-05-30 MRN: [id number]   Date of Service  01/16/2022  HPI/Events of Note  K+ 5.7, Cr 3.24, GFR 18.  eICU Interventions  Lokelma 10 gm enterally x 1 ordered.        [name] Emrah Ariola 01/16/2022, 4:08 AM

## 2022-01-16 NOTE — Consult Note (Signed)
Neurology Consult H&P  Chad [name] MR# [id number] 01/16/2022  CC: Stroke and possible seizure  History is obtained from: CCM staff and chart as patient is intubated and sedated.  HPI: Chad [name] is a 88 y.o. male PMHx as reviewed below with complicated medical admission for     01/16/2022 ~0930 he was noted to have extensor posturing of the right upper extremity and upward and left gaze deviation.    LKW: unclear tNK given: No OSW IR Thrombectomy No, not candidate Modified Rankin Scale: 4-Needs assistance to walk and tend to bodily needs NIHSS: 26  LOC Responsiveness 3 LOC Questions 2 LOC Commands 1 Horizontal eye movement 0 Visual field 0 Facial palsy 0 Motor arm - Right arm 4 Motor arm - Left arm 4 Motor leg - Right leg 4 Motor leg - Left leg 4 Limb ataxia 0 Sensory test 1 Language 3 Speech 0 Extinction and inattention 0   ROS: Unable to assess due to encephalopathy, intubated and sedated.  Past Medical History:  Diagnosis Date   Arthritis    Heart murmur    asa child    Hypertension    Stroke [facility])      History reviewed. No pertinent family history.  Social History:  reports that he has never smoked. He has never used smokeless tobacco. He reports that he does not drink alcohol and does not use drugs.   Prior to Admission medications   Medication Sig Start Date End Date Taking? Authorizing Provider  amLODipine (NORVASC) 5 MG tablet Take 5 mg by mouth daily. 11/21/21   [provider]  aspirin 81 MG chewable tablet Chew 1 tablet (81 mg total) by mouth daily. 01/03/22   Shelly Coss, MD  atenolol (TENORMIN) 100 MG tablet Take 100 mg by mouth every morning.    [provider]  docusate sodium (COLACE) 100 MG capsule Take 1 capsule (100 mg total) by mouth 2 (two) times daily. Patient not taking: Reported on 12/29/2021 10/10/20   Irving Copas, PA-C  ferrous sulfate 325 (65 FE) MG tablet Take 1 tablet (325 mg total) by mouth 2  (two) times daily with a meal for 14 days. 10/10/20 10/24/20  Irving Copas, PA-C  HYDROcodone-acetaminophen (NORCO) 7.5-325 MG tablet Take 1 tablet by mouth 3 (three) times daily as needed for moderate pain. 11/25/21   [provider]  rosuvastatin (CRESTOR) 20 MG tablet Take 1 tablet (20 mg total) by mouth daily. 01/03/22   Shelly Coss, MD  tamsulosin (FLOMAX) 0.4 MG CAPS capsule Take 0.4 mg by mouth daily.    [provider]  ticagrelor (BRILINTA) 90 MG TABS tablet Take 1 tablet (90 mg total) by mouth 2 (two) times daily. 01/02/22   Shelly Coss, MD    Exam: Current vital signs: BP (!) 86/58   Pulse 68   Temp 98.2 F (36.8 C) (Oral)   Resp 12   SpO2 97%   Physical Exam  Constitutional: Appears well-developed and well-nourished.  Psych: Unable to assess due to encephalopathy, intubated and sedated. Eyes: No scleral injection HENT: No OP obstruction. Head: Normocephalic.  Cardiovascular: Normal rate and regular rhythm.  Respiratory: Effort normal, symmetric excursions bilaterally, no audible wheezing. GI: Soft.  No distension. There is no tenderness.  Skin: WDI  Neuro: Mental Status: Unable to assess due to encephalopathy, intubated and sedated. Visual Fields blinks to confrontation. Pupils are pinpoint, equal, round, and sluggishly reactive to light 53mm--->1mm. Dolls (-).  Tone is decreased. Bulk is  reduced. No spontaneous limb movement in all four extremities. No withdrawal to noxious stimulation on left. Sensation is decreased on the left side to very noxious stimulation. Deep Tendon Reflexes: 1+ and symmetric in the biceps and patellae. Toes up on left Gait - Deferred  I have reviewed labs in epic and the pertinent results are: K >7.5--->5.6 Ca 8.2  EEG showed continuous generalized and maximal left frontal 3- 6 Hz theta-delta slowing which at times appears sharply contoured. Hyperventilation and photic stimulation were not performed.   I have  reviewed the images obtained: NCT head showed a small to moderate-sized acute/subacute cortical and subcortical infarct within the right frontoparietal lobes and right insula (right MCA vascular territory) which is new as compared to prior study 12/30/2021.  Assessment: Chad [name] is a 88 y.o. male PMHx as noted above, recent stroke with residual left hemiparesis complicated by aspiration now on ventilator and sedated with witnessed seizure found to have new stroke.  Semiology is very suggestive of seizure and EEG did not show seizure or epileptiform discharges. Seizure was likely provoked and currently no objective evidence to start seizure medications and recommend treating underlying medical condition.  Impression:  Acute right MCA territory stroke Likely provoked seizure Hypocalcemia Hyperkalemia Aspiration Subacute stroke - Known proximal right M2 occlusion due to calcified embolus. Left hemiplegia.  Plan: Routine to LTM EEG for 24 hours may consider discontinuing. If seizure recurs consider loading with levetiracetam 1,000mg  with 500mg  twice daily maintenance. MRI brain. May consider vessel imaging. Continue antiplatelet therapy. Continue to treat infectious etiology. Continue to correct metabolic derangements. Continue antiplatelet therapy.  This patient is critically ill and at significant risk of neurological worsening, death and care requires constant monitoring of vital signs, hemodynamics,respiratory and cardiac monitoring, neurological assessment, discussion with family, other specialists and medical decision making of high complexity. I spent 97 minutes of neurocritical care time  in the care of  this patient. This was time spent independent of any time provided by nurse practitioner or PA.  Electronically signed by:  Lynnae Sandhoff, MD Page: [phone] 01/16/2022, 3:02 PM  If 7pm- 7am, please page neurology on call as listed in [place].

## 2022-01-16 NOTE — Progress Notes (Addendum)
[facility] Progress Note Patient Name: Chad [name] DOB: 1932-02-12 MRN: [id number]   Date of Service  01/16/2022  HPI/Events of Note  BP 89/49, MAP 62, HR 73. K+ 5.7, Cr 3.12  eICU Interventions  Peripheral Levo ordered  to keep MAP > 65.     Lokelma 10 gm PO x 1 ordered for hyperkalemia.        [name] Ellieana Dolecki 01/16/2022, 12:24 AM

## 2022-01-16 NOTE — Progress Notes (Signed)
Daily Progress Note   Patient Name: Chad [name]       Date: 01/16/2022 DOB: 01-Apr-1932  Age: 88 y.o. MRN#: [id number] Attending Physician: Juanito Doom, MD Primary Care Physician: [name], MD Admit Date: 01/08/2022  Reason for Consultation/Follow-up: Establishing goals of care  Subjective: Family members at bedside - they share daughter April is working today but they are relaying information to her - patient intubated/sedated  Length of Stay: 1  Current Medications: Scheduled Meds:   Chlorhexidine Gluconate Cloth  6 each Topical Q0600   docusate  100 mg Per Tube BID   insulin aspart  0-15 Units Subcutaneous Q4H   mouth rinse  15 mL Mouth Rinse Q2H   pantoprazole (PROTONIX) IV  40 mg Intravenous Q12H   polyethylene glycol  17 g Per Tube Daily   rosuvastatin  20 mg Per Tube Daily    Continuous Infusions:  sodium chloride     ampicillin-sulbactam (UNASYN) IV Stopped (01/16/22 1110)   fentaNYL infusion INTRAVENOUS 100 mcg/hr (01/16/22 1400)   lactated ringers Stopped (01/16/22 1038)   norepinephrine (LEVOPHED) Adult infusion 10 mcg/min (01/16/22 1400)    PRN Meds: acetaminophen (TYLENOL) oral liquid 160 mg/5 mL **OR** acetaminophen, fentaNYL, midazolam  Physical Exam Constitutional:      General: He is not in acute distress.    Appearance: He is ill-appearing.     Comments: sedated  Cardiovascular:     Rate and Rhythm: Normal rate.  Pulmonary:     Effort: Pulmonary effort is normal.  Skin:    General: Skin is warm and dry.             Vital Signs: BP (!) 86/58   Pulse 68   Temp 98.2 F (36.8 C) (Oral)   Resp 12   SpO2 97%  SpO2: SpO2: 97 % O2 Device: O2 Device: Ventilator O2 Flow Rate: O2 Flow Rate (L/min): 4 L/min  Intake/output summary:  Intake/Output  Summary (Last 24 hours) at 01/16/2022 1431 Last data filed at 01/16/2022 1400 Gross per 24 hour  Intake 5437.8 ml  Output 2100 ml  Net 3337.8 ml   LBM: Last BM Date : 01/18/2022 Baseline Weight:   Most recent weight:         Flowsheet Rows    Flowsheet Row Most Recent Value  Intake Tab   Referral Department Hospitalist  Unit at Time of Referral ICU  Palliative Care Primary Diagnosis Sepsis/Infectious Disease  Date Notified 01/18/2022  Palliative Care Type New Palliative care  Reason for referral Clarify Goals of Care  Date of Admission 01/02/2022  Date first seen by Palliative Care 01/08/2022  # of days Palliative referral response time 0 Day(s)  # of days IP prior to Palliative referral 0  Clinical Assessment   Psychosocial & Spiritual Assessment   Palliative Care Outcomes        Patient Active Problem List   Diagnosis Date Noted   Acute respiratory failure with hypoxia ([place]) 01/14/2022   Protein-calorie malnutrition, severe 01/11/2022   Right middle cerebral artery stroke ([place]) 01/02/2022   Internal carotid artery stenosis, right 01/01/2022   Stroke (cerebrum) ([place]) 12/29/2021   CKD (chronic kidney disease) stage 3, GFR 30-59 ml/min (  [place]) 12/29/2021   Anemia 12/29/2021   Hyperlipidemia 12/29/2021   Essential hypertension 12/29/2021   Osteoarthritis of left knee 10/09/2020   S/P left TKA 10/09/2020   S/P total knee arthroplasty, left 10/09/2020   Acute blood loss anemia 03/31/2013   Osteoarthritis of right knee 03/30/2013    Palliative Care Assessment & Plan   HPI: 88 y.o. male  with past medical history of HTN, CKD, hld, and chronic pain admitted on 01/24/2022 with respiratory distress.  Patient initially admitted 5/29 with stroke and underwent balloon angioplasty of ICA.  He was transferred to CIR on 6/1.  During CIR admission his swallowing worsened and he had a feeding tube placed 6/9.  On 6/14 he developed respiratory distress and required readmission to the hospital  with eventual transfer to ICU and intubation.  Patient also with a AKI and hyperkalemia.  Also with hemoglobin of 6.1.  GI consulted.  Found to have lactic acid of 4.  PMT consulted to discuss goals of care.  Assessment: Discussed situation with bedside RN and Georgann Housekeeper, NP.  Follow-up today with patient's other family members that have come from out of town.  I introduced Palliative Medicine as specialized medical care for people living with serious illness. It focuses on providing relief from the symptoms and stress of a serious illness. The goal is to improve quality of life for both the patient and the family.  Family members at bedside and providing updates to patient's daughter April but family shares they are waiting for all of the family to join together at the hospital before they make any decisions.  They understand the seriousness of the situation.  I later joined Georgann Housekeeper as detailed update was provided to multiple family members via video chat on family members phone.  Again family reports waiting for more family members to arrive to the hospital to make decisions.  Palliative team remains available to support family and decision making.  Recommendations/Plan: Ongoing goals of care discussions  Care plan was discussed with CCM NP, bedside RN, multiple family members  Thank you for allowing the Palliative Medicine Team to assist in the care of this patient.   *Please note that this is a verbal dictation therefore any spelling or grammatical errors are due to the "[place] [employer]" system interpretation.  Juel Burrow, DNP, [facility] Palliative Medicine Team Team Phone # [phone]  Pager [phone]

## 2022-01-16 NOTE — Progress Notes (Signed)
[facility]astroenterology Progress Note  Chad [name] 88 y.o. 1931-08-17  Subjective: Patient seen and examined lying in bed.  Patient is intubated.  Family members at bedside.  This morning patient began to have extensor posturing of the right upper extremity CT head and EEG ordered neurology consulted.  No further instances of melena reported.  ROS : Review of Systems  Unable to perform ROS: Intubated     Objective: Vital signs in last 24 hours: Vitals:   01/16/22 1000 01/16/22 1015  BP: (!) 77/49 (!) 91/52  Pulse: 62 61  Resp: 16 17  Temp:    SpO2: 100% 100%    Physical Exam:  General:  Intubated, sedated, no distress, appears stated age, pale  Head:  Normocephalic, without obvious abnormality, atraumatic  Eyes:  Anicteric sclera, EOM's intact  Lungs:   Clear to auscultation bilaterally  Heart:  Regular rate and rhythm, S1, S2 normal  Abdomen:   Soft, non-tender, no masses, normal bowel sounds  Extremities: Extremities normal, atraumatic,  Pulses: 2+ and symmetric    Lab Results: Recent Labs    01/27/2022 2335 01/16/22 0300  NA 139 135  K 5.7* 5.7*  CL 108 101  CO2 21* 21*  GLUCOSE 104* 97  BUN 56* 58*  CREATININE 3.12* 3.24*  CALCIUM 8.3* 8.3*   Recent Labs    01/22/2022 1118 01/16/22 0300  AST 25 22  ALT 14 13  ALKPHOS 53 53  BILITOT 0.6 0.5  PROT 5.9* 5.4*  ALBUMIN 2.1* 1.8*   Recent Labs    01/28/2022 0703 01/08/2022 1118 01/20/2022 1202 01/12/2022 2335 01/16/22 0300  WBC 14.2* 14.3*  --   --  18.4*  NEUTROABS 9.9*  --   --   --  11.1*  HGB 8.0* 5.6*   < > 9.9* 9.7*  HCT 24.4* 17.2*   < > 27.2* 27.5*  MCV 84.1 86.4  --   --  88.7  PLT 298 278  --   --  212   < > = values in this interval not displayed.   No results for input(s): "LABPROT", "INR" in the last 72 hours.    Assessment Melena Anemia   Patient with 1 large melanotic stool 6/15 with subsequent drop in hemoglobin from 8.0-5.6.  Hemoglobin nine 9.9 after 2 units packed red blood  cells.  Hgb stable overnight.   BUN elevated at 58 baseline around 30.  Creatinine elevated at 3.24 from 1.3 on 01/12/2022 Potassium significantly elevated at 6.3 on admission now 5.7  Today blood pressures have been decreased most recently 92/52. Started Levophed overnight.  Plan: Plan for EGD tomorrow. I thoroughly discussed the procedures to include nature, alternatives, benefits, and risks including but not limited to bleeding, perforation, infection, anesthesia/cardiac and pulmonary complications. Patient provides understanding and gave verbal consent to proceed. Continue Protonix IV 40mg  BID Continue NPO Continue anti-emetics and supportive care as needed. [facility] will follow.     Arvella Nigh Million Maharaj PA-C 01/16/2022, 10:19 AM  Contact #  [phone]

## 2022-01-16 NOTE — Progress Notes (Addendum)
New USG PIV placed left lower arm. Tip of catheter noted for vasopressor iwatch placement. Patient has extremely limited peripheral vasculature access. Patient is edematous and IV sites leaking due to fluid volume.   IV consult placed for PIV access. Consult note reports PIV is leaking. Patient is on pressors. IV team placed USG PIV that's being utilized. Patient has had numerous PIV's during hospitalization; IV team assessed patient earlier this morning and could not locate viable options for PIV access. Secure chat sent to primary RN who stated she'd reach out to [facility] team for possible central line placement. Patient appears to be a candidate given his needs, acuity and lack of peripheral access.   Rodolfo Notaro Lorita Officer, RN

## 2022-01-16 NOTE — Progress Notes (Signed)
PCCM INTERVAL PROGRESS NOTE  Updated [phone]  Called to bedside to evaluate for extensor posturing of the right upper extremity and upward L gaze preference.   CT head and EEG ordered  Ativan 2 mg given. Symptoms resolved.   Appreciate neurology  Georgann Housekeeper, AGACNP-BC [place] [facility]  See Amion for personal pager PCCM on call pager [phone] until 7pm. Please call Elink 7p-7a. [phone]  01/16/2022 9:57 AM

## 2022-01-16 NOTE — Progress Notes (Signed)
An USGPIV (ultrasound guided PIV) has been placed for short-term vasopressor infusion. A correctly placed ivWatch must be used when administering Vasopressors. Should this treatment be needed beyond 72 hours, central line access should be obtained.  It will be the responsibility of the bedside nurse to follow best practice to prevent extravasations.   ?

## 2022-01-16 NOTE — Progress Notes (Addendum)
LTM maint complete - no skin breakdown under:  Fp1 Fp2 A1 F3 [facility] monitored, Event button test confirmed by [facility].

## 2022-01-16 NOTE — Progress Notes (Signed)
Initial Nutrition Assessment  DOCUMENTATION CODES:   Non-severe (moderate) malnutrition in context of chronic illness  INTERVENTION:   Tube feeding recommendations: - Start Osmolite 1.5 @ 20 ml/hr and advance by 10 ml q 8 hours to goal rate of 50 ml/hr (1200 ml/day) - ProSource TF 45 ml daily  Recommended tube feeding regimen at goal rate would provide 1840 kcal, 86 grams of protein, and 914 ml of H2O.   NUTRITION DIAGNOSIS:   Moderate Malnutrition related to chronic illness (CKD stage IIIa, worsening dysphagia) as evidenced by moderate fat depletion, severe muscle depletion.  GOAL:   Patient will meet greater than or equal to 90% of their needs  MONITOR:   Vent status, Labs, Weight trends, I & O's  REASON FOR ASSESSMENT:   Ventilator    ASSESSMENT:   89 year old male who was admitted to acute care from CIR on 6/14 with SOB. PMH of HTN, recent admission for stroke with left-sided hemiplegia s/p balloon angioplasty of ICA, CKD stage IIIa, HLD. While in CIR, pt had Cortrak placed for tube feeds due to worsening dysphagia. Pt admitted with acute hypoxic respiratory failure, aspiration pneumonia, AKI on CKD.  06/14 - intubated  Discussed pt with RN and during ICU rounds. Pt with neurologic changes. Per CCM NP, no plans to start tube feeds today but will reevaluate when pt stabilizes. RD will leave tube feeding recommendations.  Cortrak NG tube remains in stomach per abdominal x-ray yesterday, currently clamped. Abdominal x-ray yesterday also showing decreased large bowel gas and borderline to mildly dilated mid-abdominal small bowel loops. Per x-ray reading, this could reflect ileus or small bowel obstruction.  Unable to obtain diet and weight history at this time. Reviewed weight history in chart. Pt with a 2.3 kg weight loss since 01/01/21. This is a 3.7% weight loss in 2 weeks which is significant for timeframe. Pt meets criteria for malnutrition.  Noted Palliative Medicine  has been consulted.  Patient is currently intubated on ventilator support MV: 6.8 L/min Temp (24hrs), Avg:99 F (37.2 C), Min:97.7 F (36.5 C), Max:102.2 F (39 C)  Drips: Fentanyl Levophed LR: 100 ml/hr  Medications reviewed and include: colace, SSI q 4 hours, IV protonix, miralax, IV abx  Labs reviewed: potassium 5.7, BUN 58, creatinine 3.24, WBC 18.4, hemoglobin 9.7 CBG's: 88-190 x 24 hours  UOP: 2825 ml x 24 hours I/O's: +2.5 L since admit  NUTRITION - FOCUSED PHYSICAL EXAM:  Flowsheet Row Most Recent Value  Orbital Region Moderate depletion  Upper Arm Region Moderate depletion  Thoracic and Lumbar Region Moderate depletion  Buccal Region Unable to assess  Temple Region Severe depletion  Clavicle Bone Region Moderate depletion  Clavicle and Acromion Bone Region Severe depletion  Scapular Bone Region Severe depletion  Dorsal Hand Severe depletion  Patellar Region Severe depletion  Anterior Thigh Region Severe depletion  Posterior Calf Region Moderate depletion  Edema (RD Assessment) Mild  [BLE]  Hair Reviewed  Eyes Reviewed  Mouth Reviewed  Skin Reviewed  Nails Reviewed       Diet Order:   Diet Order     None       EDUCATION NEEDS:   Not appropriate for education at this time  Skin:  Skin Assessment: Reviewed RN Assessment  Last BM:  01/29/2022 multiple type 6  Height:   Ht Readings from Last 1 Encounters:  01/02/22 5\' 1"  (1.549 m)    Weight:   Wt Readings from Last 1 Encounters:  01/14/22 59.9 kg    BMI:  24.95 kg/m2  Estimated Nutritional Needs:   Kcal:  1700-1900  Protein:  85-95 grams  Fluid:  1.7 L    Gustavus Bryant, MS, RD, LDN Inpatient Clinical Dietitian Please see AMiON for contact information.

## 2022-01-16 NOTE — Progress Notes (Signed)
NAME:  Chad [name], MRN:  [id number], DOB:  12/23/1931, LOS: 1 ADMISSION DATE:  01/06/2022, CONSULTATION DATE: 01/29/2022 REFERRING MD:  Nena Alexander MD, CHIEF COMPLAINT: Acute respiratory failure, aspiration.    History of Present Illness:  88 year old with hypertension with recent admission for stroke, left hemiplegia status post balloon angioplasty of ICA.  He was transferred to rehab on 01/02/22.  Readmitted on 6/14 with respiratory distress, chest x-ray with bilateral infiltrates possible aspiration of tube feeds.  He was made n.p.o. and transferred to medical floor and placed on BiPAP.  PCCM consulted for help with management.  Pertinent  Medical History    has a past medical history of Arthritis, Heart murmur, Hypertension, and Stroke ([place]).   Significant Hospital Events: Including procedures, antibiotic start and stop dates in addition to other pertinent events   6/14 Admit from CIR, intubated upon arrival to ICU  Interim History / Subjective:  No acute events overnight. Did not wean well this morning, although was on fentanyl. Started on levophed overnight.   Objective   Blood pressure (!) 113/57, pulse 69, temperature 100 F (37.8 C), temperature source Oral, resp. rate (!) 0, SpO2 95 %.    Vent Mode: PRVC FiO2 (%):  [50 %-100 %] 60 % Set Rate:  [16 bmp-18 bmp] 16 bmp Vt Set:  [490 mL] 490 mL PEEP:  [5 cmH20] 5 cmH20 Plateau Pressure:  [10 cmH20-26 cmH20] 13 cmH20   Intake/Output Summary (Last 24 hours) at 01/16/2022 0732 Last data filed at 01/16/2022 0700 Gross per 24 hour  Intake 4923.11 ml  Output 2825 ml  Net 2098.11 ml   There were no vitals filed for this visit.  Examination:  Gen:      Frail elderly gentleman in NAD on vent.  HEENT:  [place]/AT, PERRL, no JVD Lungs:    Clear bilateral breath sounds.  CV:         RRR, no MRG Abd:      Soft, non-tender, non-distended Ext:    Trace pre-tibial edema. No acute deformity.  Skin:      Grossly intact.  Neuro:   Sedated.   Labs/imaging reviewed  K 5.7, Creat 3.24, calcium 8.3, WBC 18 Tmax 102.2  Resolved Hospital Problem list     Assessment & Plan:  Acute hypoxic respiratory failure, aspiration pneumonia - Full vent support - Will need SBT off sedation this morning - Fentanyl infusion for RASS goal -1 to -2.  - VAP bundle - PAD protocol  AKI on CKD stage IIIa secondary to ATN,  Hyperkalemia - Has received multiple temporizing doses for hyper K plus lokelma x 2 - Foley still needed - Repeat chemistry at 10 - IV fluids LR at 100. > will not contribute to hyperkalemia OK to use LR.  - Creatinine worsening. Not a good candidate for HD  Acute CVA - Left hemiplegia - Neuro monitoring  Chronic HFpEF - HTN - Hold Lasix for now.  Hydrate as above.  DM - SSI coverage  Goals of care - Family sleeping. Will discuss after chemistry at [place] (right click and "Reselect all SmartList Selections" daily)   Diet/type: NPO DVT prophylaxis: prophylactic heparin  GI prophylaxis: PPI Lines: N/A Foley:  Yes, and it is still needed Code Status:  full code Last date of multidisciplinary goals of care discussion []   Critical care time:    The patient is critically ill with multiple organ system failure and requires high complexity decision making for assessment and  support, frequent evaluation and titration of therapies, advanced monitoring, review of radiographic studies and interpretation of complex data.   Critical Care Time devoted to patient care services, exclusive of separately billable procedures, described in this note is 39 minutes.   Georgann Housekeeper, AGACNP-BC [place] [facility]  See Amion for personal pager PCCM on call pager [phone] until 7pm. Please call Elink 7p-7a. [phone]  01/16/2022 7:34 AM

## 2022-01-16 NOTE — Progress Notes (Signed)
Pt was transported to CT scan and back without complications.  

## 2022-01-17 ENCOUNTER — Encounter (HOSPITAL_COMMUNITY): Payer: Self-pay | Admitting: Internal Medicine

## 2022-01-17 ENCOUNTER — Encounter (HOSPITAL_COMMUNITY): Payer: Self-pay | Admitting: Certified Registered Nurse Anesthetist

## 2022-01-17 ENCOUNTER — Inpatient Hospital Stay (HOSPITAL_COMMUNITY): Payer: Medicare Other

## 2022-01-17 ENCOUNTER — Encounter (HOSPITAL_COMMUNITY): Admission: AD | Disposition: E | Payer: Self-pay | Source: Intra-hospital | Attending: Family Medicine

## 2022-01-17 DIAGNOSIS — E440 Moderate protein-calorie malnutrition: Secondary | ICD-10-CM

## 2022-01-17 DIAGNOSIS — I63231 Cerebral infarction due to unspecified occlusion or stenosis of right carotid arteries: Secondary | ICD-10-CM | POA: Diagnosis not present

## 2022-01-17 DIAGNOSIS — J9601 Acute respiratory failure with hypoxia: Secondary | ICD-10-CM | POA: Diagnosis not present

## 2022-01-17 DIAGNOSIS — A419 Sepsis, unspecified organism: Secondary | ICD-10-CM | POA: Diagnosis not present

## 2022-01-17 DIAGNOSIS — N179 Acute kidney failure, unspecified: Secondary | ICD-10-CM | POA: Diagnosis not present

## 2022-01-17 HISTORY — PX: ESOPHAGOGASTRODUODENOSCOPY (EGD) WITH PROPOFOL: SHX5813

## 2022-01-17 LAB — MAGNESIUM
Magnesium: 1.7 mg/dL (ref 1.7–2.4)
Magnesium: 2.2 mg/dL (ref 1.7–2.4)

## 2022-01-17 LAB — COMPREHENSIVE METABOLIC PANEL
ALT: 15 U/L (ref 0–44)
AST: 27 U/L (ref 15–41)
Albumin: 2 g/dL — ABNORMAL LOW (ref 3.5–5.0)
Alkaline Phosphatase: 66 U/L (ref 38–126)
Anion gap: 13 (ref 5–15)
BUN: 57 mg/dL — ABNORMAL HIGH (ref 8–23)
CO2: 21 mmol/L — ABNORMAL LOW (ref 22–32)
Calcium: 8 mg/dL — ABNORMAL LOW (ref 8.9–10.3)
Chloride: 103 mmol/L (ref 98–111)
Creatinine, Ser: 3.01 mg/dL — ABNORMAL HIGH (ref 0.61–1.24)
GFR, Estimated: 19 mL/min — ABNORMAL LOW (ref >60)
Glucose, Bld: 121 mg/dL — ABNORMAL HIGH (ref 70–99)
Potassium: 5.4 mmol/L — ABNORMAL HIGH (ref 3.5–5.1)
Sodium: 137 mmol/L (ref 135–145)
Total Bilirubin: 0.7 mg/dL (ref 0.3–1.2)
Total Protein: 5.7 g/dL — ABNORMAL LOW (ref 6.5–8.1)

## 2022-01-17 LAB — PHOSPHORUS
Phosphorus: 5.3 mg/dL — ABNORMAL HIGH (ref 2.5–4.6)
Phosphorus: 5.5 mg/dL — ABNORMAL HIGH (ref 2.5–4.6)

## 2022-01-17 LAB — CBC
HCT: 28.6 % — ABNORMAL LOW (ref 39.0–52.0)
Hemoglobin: 9.5 g/dL — ABNORMAL LOW (ref 13.0–17.0)
MCH: 29.6 pg (ref 26.0–34.0)
MCHC: 33.2 g/dL (ref 30.0–36.0)
MCV: 89.1 fL (ref 80.0–100.0)
Platelets: 250 10*3/uL (ref 150–400)
RBC: 3.21 MIL/uL — ABNORMAL LOW (ref 4.22–5.81)
RDW: 16.9 % — ABNORMAL HIGH (ref 11.5–15.5)
WBC: 13.5 10*3/uL — ABNORMAL HIGH (ref 4.0–10.5)
nRBC: 0 % (ref 0.0–0.2)

## 2022-01-17 LAB — GLUCOSE, CAPILLARY
Glucose-Capillary: 103 mg/dL — ABNORMAL HIGH (ref 70–99)
Glucose-Capillary: 110 mg/dL — ABNORMAL HIGH (ref 70–99)
Glucose-Capillary: 119 mg/dL — ABNORMAL HIGH (ref 70–99)
Glucose-Capillary: 119 mg/dL — ABNORMAL HIGH (ref 70–99)
Glucose-Capillary: 125 mg/dL — ABNORMAL HIGH (ref 70–99)
Glucose-Capillary: 99 mg/dL (ref 70–99)

## 2022-01-17 SURGERY — ESOPHAGOGASTRODUODENOSCOPY (EGD) WITH PROPOFOL
Anesthesia: Monitor Anesthesia Care

## 2022-01-17 MED ORDER — ASPIRIN 325 MG PO TABS
325.0000 mg | ORAL_TABLET | Freq: Every day | ORAL | Status: DC
Start: 2022-01-17 — End: 2022-01-17

## 2022-01-17 MED ORDER — TICAGRELOR 90 MG PO TABS
90.0000 mg | ORAL_TABLET | Freq: Two times a day (BID) | ORAL | Status: DC
Start: 2022-01-17 — End: 2022-01-26
  Administered 2022-01-17 – 2022-01-26 (×18): 90 mg
  Filled 2022-01-17 (×20): qty 1

## 2022-01-17 MED ORDER — TICAGRELOR 90 MG PO TABS: 90.0000 mg | ORAL_TABLET | Freq: Two times a day (BID) | ORAL | Status: DC

## 2022-01-17 MED ORDER — SODIUM ZIRCONIUM CYCLOSILICATE 10 G PO PACK
10.0000 g | PACK | Freq: Once | ORAL | Status: AC
  Administered 2022-01-17: 10 g
  Filled 2022-01-17: qty 1

## 2022-01-17 MED ORDER — HEPARIN SODIUM (PORCINE) 5000 UNIT/ML IJ SOLN
5000.0000 [IU] | Freq: Three times a day (TID) | INTRAMUSCULAR | Status: DC
  Administered 2022-01-17 – 2022-01-26 (×27): 5000 [IU] via SUBCUTANEOUS
  Filled 2022-01-17 (×27): qty 1

## 2022-01-17 MED ORDER — PROSOURCE TF PO LIQD
45.0000 mL | Freq: Every day | ORAL | Status: DC
Start: 2022-01-17 — End: 2022-01-26
  Administered 2022-01-17 – 2022-01-26 (×9): 45 mL
  Filled 2022-01-17 (×9): qty 45

## 2022-01-17 MED ORDER — SODIUM ZIRCONIUM CYCLOSILICATE 5 G PO PACK
5.0000 g | PACK | Freq: Two times a day (BID) | ORAL | Status: DC
  Filled 2022-01-17: qty 1

## 2022-01-17 MED ORDER — ASPIRIN 81 MG PO CHEW
81.0000 mg | CHEWABLE_TABLET | Freq: Every day | ORAL | Status: DC
  Administered 2022-01-17 – 2022-01-26 (×9): 81 mg
  Filled 2022-01-17 (×9): qty 1

## 2022-01-17 MED ORDER — NOREPINEPHRINE 4 MG/250ML-% IV SOLN: 2.0000 ug/min | INTRAVENOUS | Status: DC

## 2022-01-17 MED ORDER — MAGNESIUM SULFATE 2 GM/50ML IV SOLN
2.0000 g | Freq: Once | INTRAVENOUS | Status: AC
  Administered 2022-01-17: 2 g via INTRAVENOUS
  Filled 2022-01-17: qty 50

## 2022-01-17 MED ORDER — MIDODRINE HCL 5 MG PO TABS
10.0000 mg | ORAL_TABLET | Freq: Three times a day (TID) | ORAL | Status: DC
  Filled 2022-01-17 (×2): qty 2

## 2022-01-17 MED ORDER — MIDAZOLAM HCL 2 MG/2ML IJ SOLN
2.0000 mg | Freq: Once | INTRAMUSCULAR | Status: AC
  Administered 2022-01-17: 2 mg via INTRAVENOUS
  Filled 2022-01-17: qty 2

## 2022-01-17 MED ORDER — OSMOLITE 1.5 CAL PO LIQD
1000.0000 mL | ORAL | Status: DC
  Administered 2022-01-17 – 2022-01-23 (×5): 1000 mL
  Filled 2022-01-17 (×8): qty 1000

## 2022-01-17 MED ORDER — FENTANYL CITRATE (PF) 100 MCG/2ML IJ SOLN
25.0000 ug | INTRAMUSCULAR | Status: DC | PRN
  Administered 2022-01-17: 100 ug via INTRAVENOUS
  Filled 2022-01-17 (×2): qty 2

## 2022-01-17 MED ORDER — MIDODRINE HCL 5 MG PO TABS
10.0000 mg | ORAL_TABLET | Freq: Once | ORAL | Status: AC
Start: 2022-01-17 — End: 2022-01-17
  Administered 2022-01-17: 10 mg

## 2022-01-17 SURGICAL SUPPLY — 15 items

## 2022-01-17 NOTE — Progress Notes (Signed)
STROKE TEAM PROGRESS NOTE   SUBJECTIVE (INTERVAL HISTORY) His daughter and son are at the bedside.  Overall his condition is stable. Patient had right ICA angioplasty with Dr. Tobi Bastos for earlier this month.  He was discharged to rehab CIR.  However, not able to swallow, had a PEG tube placement 01/10/2022.  Unfortunately, he had aspiration, and needed intubation for airway protection.  Also found to have large amount of dark stool with severe anemia, hemoglobin down to 5.6 received PRBC and off DAPT.  Also developed AKI.  Then patient was found to have episode of left gaze, right upper extremity posturing, status post Ativan, stat EEG no seizure.  He was put on LTM.  CT head showed right MCA stroke extended.  Patient had a EGD this morning, found food in the stomach and esophagus, but no significant source of bleeding.  LTM no seizure overnight.  Hemoglobin stabilized, put back on aspirin and Brilinta.  OBJECTIVE Temp:  [97.8 F (36.6 C)-99.1 F (37.3 C)] 98.4 F (36.9 C) (06/16 1228) Pulse Rate:  [58-80] 62 (06/16 1045) Cardiac Rhythm: Normal sinus rhythm (06/16 0800) Resp:  [0-21] 9 (06/16 1045) BP: (84-165)/(47-101) 115/57 (06/16 1045) SpO2:  [92 %-100 %] 100 % (06/16 1150) FiO2 (%):  [30 %] 30 % (06/16 1150)  Recent Labs  Lab 01/16/22 1952 01/16/22 2324 01/24/2022 0335 01/24/2022 0712 01/02/2022 1226  GLUCAP 103* 113* 110* 99 103*   Recent Labs  Lab 01/10/22 1831 01/11/22 0515 01/11/22 1650 01/12/22 0522 01/28/2022 0703 01/08/2022 2335 01/16/22 0300 01/16/22 0859 01/16/22 1208 01/06/2022 0339  NA  --   --   --  146*   < > 139 135 138 138 137  K  --   --   --  3.3*   < > 5.7* 5.7* >7.5* 5.6* 5.4*  CL  --   --   --  116*   < > 108 101 104 105 103  CO2  --   --   --  22   < > 21* 21* 23 23 21*  GLUCOSE  --   --   --  134*   < > 104* 97 105* 115* 121*  BUN  --   --   --  30*   < > 56* 58* 58* 56* 57*  CREATININE  --   --   --  1.30*   < > 3.12* 3.24* 3.12* 3.05* 3.01*  CALCIUM  --    --   --  8.3*   < > 8.3* 8.3* 8.4* 8.2* 8.0*  MG 2.1 2.0 1.9 1.9  --   --   --   --   --  1.7  PHOS 3.3 2.9 2.2* 1.8*  --   --   --   --   --  5.3*   < > = values in this interval not displayed.   Recent Labs  Lab 01/20/2022 0703 01/03/2022 1118 01/16/22 0300 01/16/2022 0339  AST _0 ALT _1 ALKPHOS 58 53 53 66  BILITOT 0.6 0.6 0.5 0.7  PROT 6.7 5.9* 5.4* 5.7*  ALBUMIN 2.4* 2.1* 1.8* 2.0*   Recent Labs  Lab 01/26/2022 0703 01/31/2022 1118 01/07/2022 1202 01/05/2022 2335 01/16/22 0300 01/25/2022 0339  WBC 14.2* 14.3*  --   --  18.4* 13.5*  NEUTROABS 9.9*  --   --   --  11.1*  --   HGB 8.0* 5.6* 6.1* 9.9* 9.7* 9.5*  HCT 24.4*  17.2* 18.0* 27.2* 27.5* 28.6*  MCV 84.1 86.4  --   --  88.7 89.1  PLT 298 278  --   --  212 250   No results for input(s): "CKTOTAL", "CKMB", "CKMBINDEX", "TROPONINI" in the last 168 hours. No results for input(s): "LABPROT", "INR" in the last 72 hours. Recent Labs    01/12/2022 1040  COLORURINE YELLOW  LABSPEC 1.009  PHURINE 6.0  GLUCOSEU 50*  HGBUR MODERATE*  BILIRUBINUR NEGATIVE  KETONESUR NEGATIVE  PROTEINUR NEGATIVE  NITRITE NEGATIVE  LEUKOCYTESUR SMALL*       Component Value Date/Time   CHOL 149 12/30/2021 0412   TRIG 68 12/30/2021 0412   HDL 45 12/30/2021 0412   CHOLHDL 3.3 12/30/2021 0412   VLDL 14 12/30/2021 0412   LDLCALC 90 12/30/2021 0412   Lab Results  Component Value Date   HGBA1C 6.1 (H) 12/29/2021   No results found for: "LABOPIA", "COCAINSCRNUR", "LABBENZ", "AMPHETMU", "THCU", "LABBARB"  No results for input(s): "ETH" in the last 168 hours.  I have personally reviewed the radiological images below and agree with the radiology interpretations.  DG Abd Portable 1V  Result Date: 01/12/2022 CLINICAL DATA:  Feeding tube EXAM: PORTABLE ABDOMEN - 1 VIEW COMPARISON:  None Available. FINDINGS: Enteric tube is present with tip overlying the distal stomach in the pyloric region. There is some contrast within the fundus of  the stomach and colon. IMPRESSION: Enteric tube tip overlies the distal stomach. Electronically Signed   By: Macy Mis M.D.   On: 01/27/2022 10:51   Overnight EEG with video  Result Date: 01/25/2022 Lora Havens, MD     01/16/2022  9:51 AM Patient Name: Chad [name] MRN: [id number] Epilepsy Attending: Lora Havens Referring Physician/Provider: Gwinda Maine, MD Duration: 01/16/2022 1240 to 01/16/2022 0824  Patient history: 88 year old male with history of stroke had an episode of posturing right arm and left gaze.  EEG to evaluate for seizure.  Level of alertness:  lethargic  AEDs during EEG study: None  Technical aspects: This EEG study was done with scalp electrodes positioned according to the 10-20 International system of electrode placement. Electrical activity was acquired at a sampling rate of _0  and reviewed with a high frequency filter of _1  and a low frequency filter of _2 . EEG data were recorded continuously and digitally stored.  Description: EEG showed continuous generalized and and lateralized right hemisphere 3- 6 Hz theta-delta slowing. Hyperventilation and photic stimulation were not performed.    ABNORMALITY - Continuous slow, generalized and lateralized right hemisphere  IMPRESSION: This study is suggestive of cortical dysfunction arising from right hemisphere which is likely secondary to underlying strokes. Additionally there is moderate to severe diffuse encephalopathy, nonspecific etiology. No seizures or epileptiform discharges were seen throughout the recording.  Lora Havens    EEG adult  Result Date: 01/16/2022 Lora Havens, MD     01/16/2022 12:45 PM Patient Name: Chad [name] MRN: [id number] Epilepsy Attending: Lora Havens Referring Physician/Provider: Corey Harold, NP Date: 01/16/2022 Duration: 21.43 mins Patient history: 88 year old male with history of stroke had an episode of posturing right arm and left gaze.  EEG to evaluate for  seizure. Level of alertness:  lethargic AEDs during EEG study: Ativan Technical aspects: This EEG study was done with scalp electrodes positioned according to the 10-20 International system of electrode placement. Electrical activity was acquired at a sampling rate of _3  and reviewed with a high frequency filter of _4  and a low  frequency filter of _0 . EEG data were recorded continuously and digitally stored. Description: EEG showed continuous generalized and maximal left frontal 3- 6 Hz theta-delta slowing which at times appears sharply contoured. Hyperventilation and photic stimulation were not performed.   ABNORMALITY - Continuous slow, generalized IMPRESSION: This study is suggestive of moderate to severe diffuse encephalopathy, nonspecific etiology. No seizures or epileptiform discharges were seen throughout the recording. Priyanka Barbra Sarks   CT HEAD WO CONTRAST (5MM)  Result Date: 01/16/2022 CLINICAL DATA:  Mental status change, unknown cause. EXAM: CT HEAD WITHOUT CONTRAST TECHNIQUE: Contiguous axial images were obtained from the base of the skull through the vertex without intravenous contrast. RADIATION DOSE REDUCTION: This exam was performed according to the departmental dose-optimization program which includes automated exposure control, adjustment of the mA and/or kV according to patient size and/or use of iterative reconstruction technique. COMPARISON:  CT angiogram head/neck 12/30/2021. Brain MRI 12/30/2021. FINDINGS: Brain: Moderate cerebral atrophy. New from the prior head CT of 12/30/2021, there is a small to moderate-sized acute/subacute cortical and subcortical infarct within the right frontoparietal lobes and right insula (right MCA vascular territory). Additional known small subacute infarcts within the right MCA vascular territory were better appreciated on the prior brain MRI of 12/30/2021 (acute at that time). Background advanced patchy and ill-defined hypoattenuation within the  cerebral white matter, nonspecific but compatible chronic small vessel ischemic disease. Redemonstrated chronic lacunar infarcts within the left basal ganglia and thalamus. There is no acute intracranial hemorrhage. No extra-axial fluid collection. No evidence of an intracranial mass. No midline shift. Vascular: Atherosclerotic calcifications. Redemonstrated focus of calcification along an M2 right middle cerebral artery, which may reflect a calcified embolus. Skull: No fracture or aggressive osseous lesion. Sinuses/Orbits: No mass or acute finding within the imaged orbits. Trace mucosal thickening within the bilateral ethmoid sinuses. IMPRESSION: New from the prior head CT of 12/30/2021, there is a small to moderate-sized acute/subacute cortical and subcortical infarct within the right frontoparietal lobes and right insula (right MCA vascular territory). Additional known small subacute infarcts within the right MCA vascular territory were better appreciated on the prior brain MRI of 12/30/2021 (acute at that time). Background advanced chronic small vessel ischemic changes within the cerebral white matter. Redemonstrated chronic lacunar infarcts within the left basal ganglia and left thalamus. Moderate generalized cerebral atrophy. Electronically Signed   By: Kellie Simmering D.O.   On: 01/16/2022 11:28   US RENAL  Result Date: 01/27/2022 CLINICAL DATA:  Renal dysfunction EXAM: RENAL / URINARY TRACT ULTRASOUND COMPLETE COMPARISON:  None Available. FINDINGS: Right Kidney: Renal measurements: 8.9 x 5 x 5 cm = volume: 115.2 mL. There is no hydronephrosis. There is increased cortical echogenicity. Left Kidney: Renal measurements: 10.3 x 6.2 x 5 cm = volume: 165.9 mL. There is no hydronephrosis. There is increased cortical echogenicity. Bladder: Foley catheter is seen in the bladder. Bladder is not adequately distended for evaluation. Other: 1.1 cm splenule is noted adjacent to the inferior margin of spleen. Left pleural  effusion is seen. IMPRESSION: There is no hydronephrosis. Increased cortical echogenicity suggests medical renal disease. Left pleural effusion. Electronically Signed   By: Elmer Picker M.D.   On: 01/09/2022 12:21   Portable Chest x-ray  Result Date: 01/18/2022 CLINICAL DATA:  Intubation EXAM: PORTABLE CHEST 1 VIEW COMPARISON:  01/20/2022, 3:13 a.m. FINDINGS: Interval endotracheal intubation, tip approximately 2 cm above the carina. Enteric feeding tube remains in position, partially imaged. Cardiomegaly. Diffuse bilateral heterogeneous and interstitial airspace opacity, unchanged. IMPRESSION: 1. Interval  endotracheal intubation, tip approximately 2 cm above the carina. Enteric feeding tube remains in position, partially imaged. 2. Cardiomegaly. Diffuse bilateral heterogeneous and interstitial airspace opacity, unchanged. Electronically Signed   By: Delanna Ahmadi M.D.   On: 01/08/2022 10:39   DG Abd 1 View  Result Date: 01/19/2022 CLINICAL DATA:  88 year old male with abdominal pain and vomiting. EXAM: ABDOMEN - 1 VIEW COMPARISON:  01/10/2022. FINDINGS: Portable AP supine views at 0704 hours. Patient is mildly rotated to the left but the enteric tube tip is now in the gastric body. Extensive Calcified aortic atherosclerosis. Pelvic vascular calcifications. Progressive opacification of the visible left lung base, left hemidiaphragm now largely obscured. Decreased large bowel gas and borderline to mildly dilated gas-filled mid abdominal small bowel loops now. Spinal scoliosis and degeneration. No acute osseous abnormality identified. IMPRESSION: 1. Enteric tube tip now in the gastric body. 2. Decreased large bowel gas and borderline to mildly dilated mid abdominal small bowel loops. This could reflect ileus or developing small bowel obstruction. Follow-up abdominal radiographs may be valuable. 3. Progressive opacification at the left lung base since 01/10/2022, see portable chest x-ray 0313 hours today.  Electronically Signed   By: Genevie Ann M.D.   On: 01/25/2022 07:37   DG Chest Port 1 View  Result Date: 01/31/2022 CLINICAL DATA:  Acute respiratory distress EXAM: PORTABLE CHEST 1 VIEW COMPARISON:  03/23/2022 FINDINGS: Cardiomegaly. Vascular congestion. Aortic atherosclerosis. Perihilar airspace opacities bilaterally. No effusions. No acute bony abnormality. IMPRESSION: Perihilar airspace opacities, likely edema although pneumonia not excluded. Electronically Signed   By: Rolm Baptise M.D.   On: 01/03/2022 03:29   DG Swallowing Func-Speech Pathology  Result Date: 01/10/2022 Table formatting from the original result was not included. Objective Swallowing Evaluation: Type of Study: MBS-Modified Barium Swallow Study  Patient Details Name: Chad [name] MRN: [id number] Date of Birth: 1931-08-07 Today's Date: 01/10/2022 Time: SLP Start Time (ACUTE ONLY): 1100 -SLP Stop Time (ACUTE ONLY): 1123 SLP Time Calculation (min) (ACUTE ONLY): 23 min Past Medical History: Past Medical History: Diagnosis Date  Arthritis   Heart murmur   asa child   Hypertension   Stroke [facility])  Past Surgical History: Past Surgical History: Procedure Laterality Date  EYE SURGERY Bilateral 2022  IR ANGIO INTRA EXTRACRAN SEL COM CAROTID INNOMINATE UNI R MOD SED  01/01/2022  IR CT HEAD LTD  01/01/2022  IR PTA INTRACRANIAL  01/01/2022  JOINT REPLACEMENT    RADIOLOGY WITH ANESTHESIA N/A 01/01/2022  Procedure: Angiogram;  Surgeon: Luanne Bras, MD;  Location: [place];  Service: Radiology;  Laterality: N/A;  TONSILLECTOMY    TOTAL KNEE ARTHROPLASTY Right 03/30/2013  Procedure: RIGHT TOTAL KNEE ARTHROPLASTY;  Surgeon: Tobi Bastos, MD;  Location: [facility];  Service: Orthopedics;  Laterality: Right;  TOTAL KNEE ARTHROPLASTY Left 10/09/2020  Procedure: TOTAL KNEE ARTHROPLASTY;  Surgeon: Paralee Cancel, MD;  Location: [facility];  Service: Orthopedics;  Laterality: Left;  70 mins HPI: 88 yo male presenting to the ED on 5/28 with L sided weakness, facial droop,  slurred speech, and fall. 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 PMH including hypothyroidism chronic pain syndromes, CKD, HTN, arthritis, bil TKA.  Subjective: alert, friendly  Recommendations for follow up therapy are one component of a multi-disciplinary discharge planning process, led by the attending physician.  Recommendations may be updated based on patient status, additional functional criteria and insurance authorization. Assessment / Plan / Recommendation   01/10/2022   8:50 PM Clinical  Impressions Clinical Impressions SLP Visit Diagnosis MBSS completed secondary to clinical concerns for aspiration of current diet textures (puree and nectar-thick liquids). At this time, pt presents with severe oropharyngeal dysphagia, with significant UES dysfunction, which resulted in significant posterior tracheal sensed aspiration (PAS 7) of pureed texture via interarytenoid space due to overflow of pyriform sinus residuals into laryngeal vestibule. Unfortunately, pharyngeal stasis and pyriform sinus residuals could not clear due to poor UES relaxation with mild esophageal backflow to pyriform sinuses. Additionally, delayed sensation of gross aspiration of nectar-thick liquid via tsp was appreciated during the swallow. [name] cough response was not effective in pulmonary clearance. Pt required tactile, hyoglossal assistance and verbal cues, from SLP, to initiate swallow response during puree trials. It should be noted that this is a significant change from pt's MBSS results documented on 01/02/2022.   Given pt's risk severity for development of aspiration PNA (Total A for oral care, decreased ambulation, deconditioning) and change in swallow function, recommend initiation of NPO with temporary alternate means of nutrition, hydration, and medications at this time. May consider GI referral to assess UES function if improvement is not noted with therapy alone. [name]  daughter present and results + recommendations were reviewed with pt an daughter who verbalized agreement with recommendations following education. MD, LPN, and PA notified of recommendations. Please see imaging report for full details. Dysphagia, oropharyngeal phase (R13.12);Cognitive communication deficit (R41.841) Impact on safety and function Severe aspiration risk;Risk for inadequate nutrition/hydration     01/02/2022  11:00 AM Treatment Recommendations Treatment Recommendations Therapy as outlined in treatment plan below     01/10/2022   1:59 PM Prognosis Prognosis for Safe Diet Advancement Good Barriers to Reach Goals Cognitive deficits;Severity of deficits   01/10/2022   8:50 PM Diet Recommendations SLP Diet Recommendations NPO;Alternative means - temporary Medication Administration Via alternative means Postural Changes Other (Comment)     01/10/2022   8:50 PM Other Recommendations Recommended Consults Consider GI evaluation;Consider esophageal assessment Oral Care Recommendations Oral care QID Other Recommendations Have oral suction available   01/10/2022   8:50 PM Frequency and Duration  Treatment Duration 2 weeks     01/10/2022   1:53 PM Oral Phase Oral Phase Impaired Oral - Nectar Teaspoon Weak lingual manipulation;Lingual pumping;Delayed oral transit;Decreased bolus cohesion;Premature spillage;Lingual/palatal residue;Piecemeal swallowing;Reduced posterior propulsion Oral - Nectar Cup NT Oral - Nectar Straw NT Oral - Thin Cup NT Oral - Thin Straw NT Oral - Puree Weak lingual manipulation;Lingual pumping;Incomplete tongue to palate contact;Lingual/palatal residue;Delayed oral transit;Decreased bolus cohesion;Premature spillage;Holding of bolus;Piecemeal swallowing Oral - Mech Soft NT Oral - Regular NT Oral - Multi-Consistency NT Oral - Pill NT    01/10/2022   1:54 PM Pharyngeal Phase Pharyngeal Phase Impaired Pharyngeal- Nectar Teaspoon Delayed swallow initiation-pyriform sinuses;Reduced pharyngeal peristalsis;Reduced  airway/laryngeal closure;Moderate aspiration;Inter-arytenoid space residue;Pharyngeal residue - cp segment;Pharyngeal residue - posterior pharnyx;Pharyngeal residue - pyriform;Pharyngeal residue - valleculae;Lateral channel residue;Penetration/Aspiration during swallow;Reduced laryngeal elevation Pharyngeal Material enters airway, passes BELOW cords and not ejected out despite cough attempt by patient Pharyngeal- Nectar Cup NT Pharyngeal- Nectar Straw NT Pharyngeal- Thin Cup NT Pharyngeal- Thin Straw NT Pharyngeal- Puree Delayed swallow initiation-pyriform sinuses;Reduced pharyngeal peristalsis;Reduced anterior laryngeal mobility;Reduced tongue base retraction;Penetration/Apiration after swallow;Moderate aspiration;Pharyngeal residue - pyriform;Pharyngeal residue - posterior pharnyx;Pharyngeal residue - cp segment;Inter-arytenoid space residue;Lateral channel residue;Compensatory strategies attempted (with notebox) Pharyngeal Material enters airway, passes BELOW cords and not ejected out despite cough attempt by patient Pharyngeal- Mechanical Soft NT Pharyngeal- Regular NT Pharyngeal- Multi-consistency NT Pharyngeal- Pill NT  01/10/2022   1:58 PM Cervical Esophageal Phase  Cervical Esophageal Phase Impaired Nectar Teaspoon Reduced cricopharyngeal relaxation Nectar Cup NT Nectar Straw NT Puree Esophageal backflow into the pharynx;Reduced cricopharyngeal relaxation Mechanical Soft NT Regular NT Multi-consistency NT Pill NT Cervical Esophageal Comment Decreased relaxation of the UES observed with significant pyriform sinus residuals Bethany A Lutes 01/10/2022, 9:06 PM                     DG Abd Portable 1V  Result Date: 01/10/2022 CLINICAL DATA:  Feeding tube placement. EXAM: PORTABLE ABDOMEN - 1 VIEW COMPARISON:  None Available. FINDINGS: Feeding tube tip is at the level of the gastric antrum. Small rounded densities are seen in the region of the colon likely related to diverticula. There is a heterogeneous rounded  density projecting over the right upper quadrant measuring 3.0 x 3.0 cm. This is indeterminate. Lung bases are clear. There is mild curvature of the lumbar spine with degenerative change. IMPRESSION: 1. Feeding tube tip is at the level of the gastric antrum. 2. Scattered colonic diverticula. 3. 3 cm heterogeneous density in the right upper quadrant, indeterminate. Findings may represent a large gallstone, renal calcification or other calcified mass. Please correlate clinically. This can be further evaluated with CT. Electronically Signed   By: Ronney Asters M.D.   On: 01/10/2022 15:07   IR ANGIO INTRA EXTRACRAN SEL COM CAROTID INNOMINATE UNI R MOD SED  Result Date: 01/03/2022 CLINICAL DATA:  Left-sided weakness with dysarthria. Patient with calcified embolus in the inferior division right MCA. Discovered to have high-grade stenosis of the extracranial right ICA proximally EXAM: IR ANGIO INTRA EXTRACRAN SEL COM CAROTID INNOMINATE UNI RIGHT MOD SED COMPARISON:  CT angiogram of the head and neck of Dec 30, 2021. MEDICATIONS: Heparin 3,000 units IV. Ancef 2 g IV antibiotic was administered within 1 hour of the procedure. ANESTHESIA/SEDATION: General anesthesia. CONTRAST:  Omnipaque 300 approximately 120 cc. FLUOROSCOPY TIME:  Fluoroscopy Time: 76 minutes 36) seconds (1257 mGy). COMPLICATIONS: None immediate. TECHNIQUE: Informed written consent was obtained from the patient after a thorough discussion of the procedural risks, benefits and alternatives. All questions were addressed. Maximal Sterile Barrier Technique was utilized including caps, mask, sterile gowns, sterile gloves, sterile drape, hand hygiene and skin antiseptic. A timeout was performed prior to the initiation of the procedure. The left groin was prepped and draped in the usual sterile fashion. Thereafter using modified Seldinger technique, transfemoral access into the left common femoral artery was obtained without difficulty. Over a 0.035 inch  guidewire, an 8 French 25 cm Pinnacle sheath was inserted. Through this, and also over 0.035 inch guidewire, a 120 cm 6 French Simmons 2 catheter inside of an 087 95 cm balloon guide catheter combination was advanced to the aortic arch region and selectively positioned in the proximal right common carotid artery and the innominate artery. An arteriogram was then performed centered extra cranially and intracranially. FINDINGS: The innominate arteriogram demonstrates significant tortuosity of the innominate artery in its entirety extending into the right common carotid artery with a 180 degree loop in the proximal right common carotid artery. Subclavian artery demonstrates patency with opacification of a hypoplastic right vertebral artery extra cranially. The right common carotid arteriogram demonstrates complex atherosclerotic calcified plaque at the right common carotid bifurcation extending into the bulb region of the right internal carotid artery resulting in a high-grade approximately 80-90% stenosis. More distally, the right internal carotid artery opacifies to the cranial skull base. Patency is seen of  the petrous, cavernous and supraclinoid right ICA with a prominent right posterior communicating artery. Proximal portions of the right middle cerebral artery and the right anterior cerebral artery demonstrate patency. ENDOVASCULAR REVASCULARIZATION OF HIGH-GRADE STENOSIS OF THE RIGHT INTERNAL CAROTID ARTERY PROXIMALLY WITH BALLOON ANGIOPLASTY The balloon guide catheter was advanced into the proximal right common carotid artery. Through this, and also over a 0.014 inch Transend EX soft tip micro guidewire, an 021 150 cm microcatheter was then advanced with biplane DSA roadmap to the proximal cavernous right ICA followed by the microcatheter. The micro guidewire was removed. Good aspiration obtained from the hub of the microcatheter. A gentle control arteriogram performed through the microcatheter demonstrated  safe positioning of the tip of the microcatheter. This in turn was then exchanged out for a 300 cm 014 inch BMW exchange wire with a moderate J configuration. The tip of the micro guidewire was positioned at the petrous cavernous junction. Measurements were then performed of the right internal carotid artery distal to the stenosis, and just proximal to the stenosis of the distal common carotid artery. A 5 mm x 20 mm 014 inch Viatrac balloon guide catheter was then prepped and purged retrogradely with heparinized saline infusion, and retrogradely with 50% contrast and 50% heparinized saline infusion. Using the rapid exchange technique, the balloon catheter was then advanced without difficulty and positioned with the distal and proximal markers adequate distant from the site of the severe stenosis in the proximal right internal carotid artery. Slow control inflation was then performed using micro inflation syringe device via micro tubing. Inflations were performed slowly to 9 atmospheres where it was maintained for approximately 60 seconds. Thereafter, the balloon was deflated and retrieved and removed. A control arteriogram performed through the balloon guide catheter in the proximal right common carotid artery demonstrated significantly improved caliber and flow through the angioplastied segment. More distally, free flow was noted into the distal right ICA intracranially. Over the exchange BMW micro guidewire, a 8 mm x 26 mm Wallstent stent delivery system which had been prepped with heparinized saline infusion was then advanced again using the rapid exchange technique to the proximal right common carotid artery. Resistance was encountered to further advancement of the stent delivery apparatus. At this time the balloon guide catheter was advanced to the distal right common carotid artery. Further attempts were made to advance the stent delivery apparatus into the right internal carotid proximally. Advancement could  only manage just distal to the angioplastied segment with no significant coverage of the angioplastied segment. Further attempts at advancing were met with resistance secondary to a step-off related to the calcified plaque. After multiple attempts it was decided to stop the procedure. Final control arteriogram performed through the balloon guide catheter in the right common carotid artery continued to demonstrate excellent flow through the right external carotid artery with a mild-to-moderate stenosis at its origin. The right internal carotid artery proximally demonstrated approximately 60% patency which was maintained on subsequent 20 minute arteriogram. Centered intracranially demonstrated brisk flow into the right anterior cerebral artery and the right middle cerebral distribution. The previously occluded anterior branch of the inferior division proximally due to a calcified plaque remained stable. The delayed arterial phase demonstrated retrograde opacification of the posterior 2/3 of the cortical branches in the sylvian triangle from the branches of the inferior division and the superior division, and also from the pericallosal branches. The balloon guide catheter was removed. The 8 French Pinnacle sheath in the left common femoral artery was removed  with manual compression held for approximately 25 minutes with a quick clot to achieved hemostasis at this site. Distal pulses remained palpable in the dorsalis pedis arteries bilaterally unchanged. An immediate CT of the brain demonstrated no evidence of hemorrhagic complications. The patient was then gradually extubated without difficulty. Patient was able to maintain his airway, and oxygen concentrations. He was able to move his right side spontaneously and to command. The patient was able to follow simple commands appropriately. No significant motor movement was evident in the left upper extremity, with only a flicker in the left foot toes. Patient was then  transferred to the PACU and then subsequently the neuro ICU for post revascularization care. Later in the afternoon, the patient appeared more awake, alert and appropriately responsive. Had mild intermittent right gaze deviation but was able to track his eyes to the left past the midline. Otherwise, neurologically he remained stable unchanged. 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 PLAN: Follow-up in the clinic 4 to 6 weeks post discharge. Electronically Signed   By: Luanne Bras M.D.   On: 01/03/2022 08:12   IR CT Head Ltd  Result Date: 01/03/2022 CLINICAL DATA:  Left-sided weakness with dysarthria. Patient with calcified embolus in the inferior division right MCA. Discovered to have high-grade stenosis of the extracranial right ICA proximally EXAM: IR ANGIO INTRA EXTRACRAN SEL COM CAROTID INNOMINATE UNI RIGHT MOD SED COMPARISON:  CT angiogram of the head and neck of Dec 30, 2021. MEDICATIONS: Heparin 3,000 units IV. Ancef 2 g IV antibiotic was administered within 1 hour of the procedure. ANESTHESIA/SEDATION: General anesthesia. CONTRAST:  Omnipaque 300 approximately 120 cc. FLUOROSCOPY TIME:  Fluoroscopy Time: 76 minutes 36) seconds (1257 mGy). COMPLICATIONS: None immediate. TECHNIQUE: Informed written consent was obtained from the patient after a thorough discussion of the procedural risks, benefits and alternatives. All questions were addressed. Maximal Sterile Barrier Technique was utilized including caps, mask, sterile gowns, sterile gloves, sterile drape, hand hygiene and skin antiseptic. A timeout was performed prior to the initiation of the procedure. The left groin was prepped and draped in the usual sterile fashion. Thereafter using modified Seldinger technique, transfemoral access into the left common femoral artery was  obtained without difficulty. Over a 0.035 inch guidewire, an 8 French 25 cm Pinnacle sheath was inserted. Through this, and also over 0.035 inch guidewire, a 120 cm 6 French Simmons 2 catheter inside of an 087 95 cm balloon guide catheter combination was advanced to the aortic arch region and selectively positioned in the proximal right common carotid artery and the innominate artery. An arteriogram was then performed centered extra cranially and intracranially. FINDINGS: The innominate arteriogram demonstrates significant tortuosity of the innominate artery in its entirety extending into the right common carotid artery with a 180 degree loop in the proximal right common carotid artery. Subclavian artery demonstrates patency with opacification of a hypoplastic right vertebral artery extra cranially. The right common carotid arteriogram demonstrates complex atherosclerotic calcified plaque at the right common carotid bifurcation extending into the bulb region of the right internal carotid artery resulting in a high-grade approximately 80-90% stenosis. More distally, the right internal carotid artery opacifies to the cranial skull base. Patency is seen of the petrous, cavernous and supraclinoid right ICA with a prominent right posterior communicating artery. Proximal portions of the right middle cerebral artery and the right anterior cerebral artery demonstrate  patency. ENDOVASCULAR REVASCULARIZATION OF HIGH-GRADE STENOSIS OF THE RIGHT INTERNAL CAROTID ARTERY PROXIMALLY WITH BALLOON ANGIOPLASTY The balloon guide catheter was advanced into the proximal right common carotid artery. Through this, and also over a 0.014 inch Transend EX soft tip micro guidewire, an 021 150 cm microcatheter was then advanced with biplane DSA roadmap to the proximal cavernous right ICA followed by the microcatheter. The micro guidewire was removed. Good aspiration obtained from the hub of the microcatheter. A gentle control arteriogram  performed through the microcatheter demonstrated safe positioning of the tip of the microcatheter. This in turn was then exchanged out for a 300 cm 014 inch BMW exchange wire with a moderate J configuration. The tip of the micro guidewire was positioned at the petrous cavernous junction. Measurements were then performed of the right internal carotid artery distal to the stenosis, and just proximal to the stenosis of the distal common carotid artery. A 5 mm x 20 mm 014 inch Viatrac balloon guide catheter was then prepped and purged retrogradely with heparinized saline infusion, and retrogradely with 50% contrast and 50% heparinized saline infusion. Using the rapid exchange technique, the balloon catheter was then advanced without difficulty and positioned with the distal and proximal markers adequate distant from the site of the severe stenosis in the proximal right internal carotid artery. Slow control inflation was then performed using micro inflation syringe device via micro tubing. Inflations were performed slowly to 9 atmospheres where it was maintained for approximately 60 seconds. Thereafter, the balloon was deflated and retrieved and removed. A control arteriogram performed through the balloon guide catheter in the proximal right common carotid artery demonstrated significantly improved caliber and flow through the angioplastied segment. More distally, free flow was noted into the distal right ICA intracranially. Over the exchange BMW micro guidewire, a 8 mm x 26 mm Wallstent stent delivery system which had been prepped with heparinized saline infusion was then advanced again using the rapid exchange technique to the proximal right common carotid artery. Resistance was encountered to further advancement of the stent delivery apparatus. At this time the balloon guide catheter was advanced to the distal right common carotid artery. Further attempts were made to advance the stent delivery apparatus into the  right internal carotid proximally. Advancement could only manage just distal to the angioplastied segment with no significant coverage of the angioplastied segment. Further attempts at advancing were met with resistance secondary to a step-off related to the calcified plaque. After multiple attempts it was decided to stop the procedure. Final control arteriogram performed through the balloon guide catheter in the right common carotid artery continued to demonstrate excellent flow through the right external carotid artery with a mild-to-moderate stenosis at its origin. The right internal carotid artery proximally demonstrated approximately 60% patency which was maintained on subsequent 20 minute arteriogram. Centered intracranially demonstrated brisk flow into the right anterior cerebral artery and the right middle cerebral distribution. The previously occluded anterior branch of the inferior division proximally due to a calcified plaque remained stable. The delayed arterial phase demonstrated retrograde opacification of the posterior 2/3 of the cortical branches in the sylvian triangle from the branches of the inferior division and the superior division, and also from the pericallosal branches. The balloon guide catheter was removed. The 8 French Pinnacle sheath in the left common femoral artery was removed with manual compression held for approximately 25 minutes with a quick clot to achieved hemostasis at this site. Distal pulses remained palpable in the dorsalis pedis arteries bilaterally unchanged.  An immediate CT of the brain demonstrated no evidence of hemorrhagic complications. The patient was then gradually extubated without difficulty. Patient was able to maintain his airway, and oxygen concentrations. He was able to move his right side spontaneously and to command. The patient was able to follow simple commands appropriately. No significant motor movement was evident in the left upper extremity, with only  a flicker in the left foot toes. Patient was then transferred to the PACU and then subsequently the neuro ICU for post revascularization care. Later in the afternoon, the patient appeared more awake, alert and appropriately responsive. Had mild intermittent right gaze deviation but was able to track his eyes to the left past the midline. Otherwise, neurologically he remained stable unchanged. 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 PLAN: Follow-up in the clinic 4 to 6 weeks post discharge. Electronically Signed   By: Luanne Bras M.D.   On: 01/03/2022 08:12   IR PTA Intracranial  Result Date: 01/03/2022 CLINICAL DATA:  Left-sided weakness with dysarthria. Patient with calcified embolus in the inferior division right MCA. Discovered to have high-grade stenosis of the extracranial right ICA proximally EXAM: IR ANGIO INTRA EXTRACRAN SEL COM CAROTID INNOMINATE UNI RIGHT MOD SED COMPARISON:  CT angiogram of the head and neck of Dec 30, 2021. MEDICATIONS: Heparin 3,000 units IV. Ancef 2 g IV antibiotic was administered within 1 hour of the procedure. ANESTHESIA/SEDATION: General anesthesia. CONTRAST:  Omnipaque 300 approximately 120 cc. FLUOROSCOPY TIME:  Fluoroscopy Time: 76 minutes 36) seconds (1257 mGy). COMPLICATIONS: None immediate. TECHNIQUE: Informed written consent was obtained from the patient after a thorough discussion of the procedural risks, benefits and alternatives. All questions were addressed. Maximal Sterile Barrier Technique was utilized including caps, mask, sterile gowns, sterile gloves, sterile drape, hand hygiene and skin antiseptic. A timeout was performed prior to the initiation of the procedure. The left groin was prepped and draped in the usual sterile fashion. Thereafter using modified Seldinger technique,  transfemoral access into the left common femoral artery was obtained without difficulty. Over a 0.035 inch guidewire, an 8 French 25 cm Pinnacle sheath was inserted. Through this, and also over 0.035 inch guidewire, a 120 cm 6 French Simmons 2 catheter inside of an 087 95 cm balloon guide catheter combination was advanced to the aortic arch region and selectively positioned in the proximal right common carotid artery and the innominate artery. An arteriogram was then performed centered extra cranially and intracranially. FINDINGS: The innominate arteriogram demonstrates significant tortuosity of the innominate artery in its entirety extending into the right common carotid artery with a 180 degree loop in the proximal right common carotid artery. Subclavian artery demonstrates patency with opacification of a hypoplastic right vertebral artery extra cranially. The right common carotid arteriogram demonstrates complex atherosclerotic calcified plaque at the right common carotid bifurcation extending into the bulb region of the right internal carotid artery resulting in a high-grade approximately 80-90% stenosis. More distally, the right internal carotid artery opacifies to the cranial skull base. Patency is seen of the petrous, cavernous and supraclinoid right ICA with a prominent right posterior communicating artery. Proximal portions of the right middle cerebral artery and the right anterior cerebral artery demonstrate patency. ENDOVASCULAR REVASCULARIZATION OF HIGH-GRADE STENOSIS OF THE RIGHT INTERNAL CAROTID ARTERY PROXIMALLY WITH BALLOON ANGIOPLASTY The balloon guide catheter was advanced into the proximal right common carotid artery. Through  this, and also over a 0.014 inch Transend EX soft tip micro guidewire, an 021 150 cm microcatheter was then advanced with biplane DSA roadmap to the proximal cavernous right ICA followed by the microcatheter. The micro guidewire was removed. Good aspiration obtained from the  hub of the microcatheter. A gentle control arteriogram performed through the microcatheter demonstrated safe positioning of the tip of the microcatheter. This in turn was then exchanged out for a 300 cm 014 inch BMW exchange wire with a moderate J configuration. The tip of the micro guidewire was positioned at the petrous cavernous junction. Measurements were then performed of the right internal carotid artery distal to the stenosis, and just proximal to the stenosis of the distal common carotid artery. A 5 mm x 20 mm 014 inch Viatrac balloon guide catheter was then prepped and purged retrogradely with heparinized saline infusion, and retrogradely with 50% contrast and 50% heparinized saline infusion. Using the rapid exchange technique, the balloon catheter was then advanced without difficulty and positioned with the distal and proximal markers adequate distant from the site of the severe stenosis in the proximal right internal carotid artery. Slow control inflation was then performed using micro inflation syringe device via micro tubing. Inflations were performed slowly to 9 atmospheres where it was maintained for approximately 60 seconds. Thereafter, the balloon was deflated and retrieved and removed. A control arteriogram performed through the balloon guide catheter in the proximal right common carotid artery demonstrated significantly improved caliber and flow through the angioplastied segment. More distally, free flow was noted into the distal right ICA intracranially. Over the exchange BMW micro guidewire, a 8 mm x 26 mm Wallstent stent delivery system which had been prepped with heparinized saline infusion was then advanced again using the rapid exchange technique to the proximal right common carotid artery. Resistance was encountered to further advancement of the stent delivery apparatus. At this time the balloon guide catheter was advanced to the distal right common carotid artery. Further attempts were  made to advance the stent delivery apparatus into the right internal carotid proximally. Advancement could only manage just distal to the angioplastied segment with no significant coverage of the angioplastied segment. Further attempts at advancing were met with resistance secondary to a step-off related to the calcified plaque. After multiple attempts it was decided to stop the procedure. Final control arteriogram performed through the balloon guide catheter in the right common carotid artery continued to demonstrate excellent flow through the right external carotid artery with a mild-to-moderate stenosis at its origin. The right internal carotid artery proximally demonstrated approximately 60% patency which was maintained on subsequent 20 minute arteriogram. Centered intracranially demonstrated brisk flow into the right anterior cerebral artery and the right middle cerebral distribution. The previously occluded anterior branch of the inferior division proximally due to a calcified plaque remained stable. The delayed arterial phase demonstrated retrograde opacification of the posterior 2/3 of the cortical branches in the sylvian triangle from the branches of the inferior division and the superior division, and also from the pericallosal branches. The balloon guide catheter was removed. The 8 French Pinnacle sheath in the left common femoral artery was removed with manual compression held for approximately 25 minutes with a quick clot to achieved hemostasis at this site. Distal pulses remained palpable in the dorsalis pedis arteries bilaterally unchanged. An immediate CT of the brain demonstrated no evidence of hemorrhagic complications. The patient was then gradually extubated without difficulty. Patient was able to maintain his airway, and oxygen concentrations.  He was able to move his right side spontaneously and to command. The patient was able to follow simple commands appropriately. No significant motor  movement was evident in the left upper extremity, with only a flicker in the left foot toes. Patient was then transferred to the PACU and then subsequently the neuro ICU for post revascularization care. Later in the afternoon, the patient appeared more awake, alert and appropriately responsive. Had mild intermittent right gaze deviation but was able to track his eyes to the left past the midline. Otherwise, neurologically he remained stable unchanged. 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 PLAN: Follow-up in the clinic 4 to 6 weeks post discharge. Electronically Signed   By: Luanne Bras M.D.   On: 01/03/2022 08:12   DG Swallowing Func-Speech Pathology  Result Date: 01/02/2022 Table formatting from the original result was not included. Objective Swallowing Evaluation: Type of Study: MBS-Modified Barium Swallow Study  Patient Details Name: Chad [name] MRN: [id number] Date of Birth: Sep 12, 1931 Today's Date: 01/02/2022 Time: SLP Start Time (ACUTE ONLY): 1100 -SLP Stop Time (ACUTE ONLY): 1123 SLP Time Calculation (min) (ACUTE ONLY): 23 min Past Medical History: Past Medical History: Diagnosis Date  Arthritis   Heart murmur   asa child   Hypertension   Stroke [facility])  Past Surgical History: Past Surgical History: Procedure Laterality Date  EYE SURGERY Bilateral 2022  JOINT REPLACEMENT    RADIOLOGY WITH ANESTHESIA N/A 01/01/2022  Procedure: Angiogram;  Surgeon: Luanne Bras, MD;  Location: [place];  Service: Radiology;  Laterality: N/A;  TONSILLECTOMY    TOTAL KNEE ARTHROPLASTY Right 03/30/2013  Procedure: RIGHT TOTAL KNEE ARTHROPLASTY;  Surgeon: Tobi Bastos, MD;  Location: [facility];  Service: Orthopedics;  Laterality: Right;  TOTAL KNEE ARTHROPLASTY Left 10/09/2020  Procedure: TOTAL KNEE ARTHROPLASTY;  Surgeon: Paralee Cancel, MD;   Location: [facility];  Service: Orthopedics;  Laterality: Left;  70 mins HPI: 88 yo male presenting to the ED on 5/28 with L sided weakness, facial droop, slurred speech, and fall. 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 PMH including hypothyroidism chronic pain syndromes, CKD, HTN, arthritis, bil TKA.  Subjective: alert, friendly  Recommendations for follow up therapy are one component of a multi-disciplinary discharge planning process, led by the attending physician.  Recommendations may be updated based on patient status, additional functional criteria and insurance authorization. Assessment / Plan / Recommendation   01/02/2022  11:00 AM Clinical Impressions Clinical Impression Pt presents with oropharyngeal dysphagia characterized by reduced labial seal, impaired mastication, a pharyngeal delay and reduction in bolus cohesion, posterior bolus propulsion, tongue base retraction, anterior laryngeal movement, and pharyngeal constriction. He demonstrated left-sided anterior spillage, lingual residue, difficulty with A-P transport of solids, premature spillage to the valleculae and pyriform sinuses, and posterior pharyngeal wall residue. A liquid bolus was necessary to facilitate mastication and A-P transport of a nutrigrain bar. Liquid washes reduced pharyngeal residue to a more functional level and a left head turn was effective in reducing pyriform sinus residue with thin liquids, but pt exhibited notable difficulty maintaining this position. Penetration (PAS 3, 5) and aspiration (PAS 7, 8) were noted with thin liquids. Larger amounts of aspirate did trigger a cough which was mostly effective, but smaller quantities inconsistently triggered throat clearing which was inadequate for expulsion of material. No functional benefit was noted  with prompted coughing. A dysphagia 1 diet with nectar thick liquids is recommended at this time. SLP will follow for dysphagia  treatment. SLP Visit Diagnosis Dysphagia, oropharyngeal phase (R13.12) Impact on safety and function Mild aspiration risk     01/02/2022  11:00 AM Treatment Recommendations Treatment Recommendations Therapy as outlined in treatment plan below     01/02/2022  11:00 AM Prognosis Prognosis for Safe Diet Advancement Good   01/02/2022  11:00 AM Diet Recommendations SLP Diet Recommendations Dysphagia 1 (Puree) solids;Nectar thick liquid Liquid Administration via Cup;Straw Medication Administration Whole meds with puree Compensations Slow rate;Small sips/bites;Monitor for anterior loss;Follow solids with liquid Postural Changes Seated upright at 90 degrees     01/02/2022  11:00 AM Other Recommendations Other Recommendations Order thickener from pharmacy Follow Up Recommendations Acute inpatient rehab (3hours/day) Assistance recommended at discharge Frequent or constant Supervision/Assistance Functional Status Assessment Patient has had a recent decline in their functional status and demonstrates the ability to make significant improvements in function in a reasonable and predictable amount of time.   01/02/2022  11:00 AM Frequency and Duration  Speech Therapy Frequency (ACUTE ONLY) min 2x/week Treatment Duration 2 weeks     01/02/2022  11:00 AM Oral Phase Oral Phase Impaired Oral - Nectar Cup Left anterior bolus loss;Reduced posterior propulsion;Lingual/palatal residue;Delayed oral transit;Decreased bolus cohesion;Premature spillage Oral - Nectar Straw Left anterior bolus loss;Reduced posterior propulsion;Lingual/palatal residue;Delayed oral transit;Decreased bolus cohesion;Premature spillage Oral - Thin Cup Left anterior bolus loss;Reduced posterior propulsion;Lingual/palatal residue;Delayed oral transit;Decreased bolus cohesion;Premature spillage Oral - Thin Straw Left anterior bolus loss;Reduced posterior propulsion;Lingual/palatal residue;Delayed oral transit;Decreased bolus cohesion;Premature spillage Oral - Puree Left anterior  bolus loss;Reduced posterior propulsion;Lingual/palatal residue;Delayed oral transit;Decreased bolus cohesion;Weak lingual manipulation Oral - Mech Soft Left anterior bolus loss;Reduced posterior propulsion;Lingual/palatal residue;Delayed oral transit;Decreased bolus cohesion;Weak lingual manipulation;Impaired mastication Oral - Pill Left anterior bolus loss;Reduced posterior propulsion;Lingual/palatal residue;Delayed oral transit;Decreased bolus cohesion    01/02/2022  11:00 AM Pharyngeal Phase Pharyngeal Phase Impaired Pharyngeal- Thin Cup Reduced tongue base retraction;Reduced anterior laryngeal mobility;Delayed swallow initiation-pyriform sinuses;Reduced pharyngeal peristalsis;Penetration/Aspiration during swallow;Penetration/Apiration after swallow;Pharyngeal residue - valleculae;Pharyngeal residue - pyriform;Pharyngeal residue - posterior pharnyx Pharyngeal Material enters airway, remains ABOVE vocal cords and not ejected out;Material enters airway, CONTACTS cords and not ejected out;Material enters airway, passes BELOW cords and not ejected out despite cough attempt by patient;Material enters airway, passes BELOW cords without attempt by patient to eject out (silent aspiration) Pharyngeal- Thin Straw Reduced tongue base retraction;Reduced anterior laryngeal mobility;Delayed swallow initiation-pyriform sinuses;Reduced pharyngeal peristalsis;Penetration/Aspiration during swallow;Penetration/Apiration after swallow;Pharyngeal residue - valleculae;Pharyngeal residue - pyriform;Pharyngeal residue - posterior pharnyx Pharyngeal Material enters airway, passes BELOW cords and not ejected out despite cough attempt by patient;Material enters airway, passes BELOW cords without attempt by patient to eject out (silent aspiration);Material enters airway, CONTACTS cords and then ejected out Pharyngeal- Puree Reduced tongue base retraction;Reduced anterior laryngeal mobility;Delayed swallow initiation-pyriform sinuses;Reduced  pharyngeal peristalsis;Pharyngeal residue - valleculae;Pharyngeal residue - pyriform;Pharyngeal residue - posterior pharnyx Pharyngeal- Mechanical Soft Reduced tongue base retraction;Reduced anterior laryngeal mobility;Delayed swallow initiation-pyriform sinuses;Reduced pharyngeal peristalsis;Pharyngeal residue - valleculae;Pharyngeal residue - pyriform;Pharyngeal residue - posterior pharnyx Pharyngeal- Pill Reduced tongue base retraction;Reduced anterior laryngeal mobility;Delayed swallow initiation-pyriform sinuses;Reduced pharyngeal peristalsis;Pharyngeal residue - valleculae;Pharyngeal residue - pyriform;Pharyngeal residue - posterior pharnyx    01/02/2022  11:00 AM Cervical Esophageal Phase  Cervical Esophageal Phase [facility] Shanika I. Hardin Negus, [place], [place] Office number [phone] Pager [place] 01/02/2022, 12:20 PM  VAS US CAROTID  Result Date: 12/31/2021 Carotid Arterial Duplex Study Patient Name:  KAMILO OCH  Date of Exam:   12/30/2021 Medical Rec #: [id number]          Accession #:    [id number] Date of Birth: 24-Nov-1931           Patient Gender: M Patient Age:   16 years Exam Location:  [facility] Procedure:      VAS US CAROTID Referring Phys: Nyoka Lint DOUTOVA --------------------------------------------------------------------------------  Indications:       CVA. Risk Factors:      Hypertension. Comparison Study:  No prior study Performing Technologist: Maudry Mayhew MHA, RDMS, RVT, RDCS  Examination Guidelines: A complete evaluation includes B-mode imaging, spectral Doppler, color Doppler, and power Doppler as needed of all accessible portions of each vessel. Bilateral testing is considered an integral part of a complete examination. Limited examinations for reoccurring indications may be performed as noted.  Right Carotid Findings: +----------+-------+--------+--------+-------------------------------+---------+            PSV    EDV cm/sStenosisPlaque Description             Comments            cm/s                                                            +----------+-------+--------+--------+-------------------------------+---------+ CCA Prox  54     7               heterogenous and irregular               +----------+-------+--------+--------+-------------------------------+---------+ CCA Distal58     9               smooth and heterogenous                  +----------+-------+--------+--------+-------------------------------+---------+ ICA Prox  132    19              smooth, heterogenous and       Shadowing                                  calcific                                 +----------+-------+--------+--------+-------------------------------+---------+ ICA Distal78     15                                                       +----------+-------+--------+--------+-------------------------------+---------+ ECA       126                                                             +----------+-------+--------+--------+-------------------------------+---------+ +----------+--------+-------+----------------+-------------------+           PSV cm/sEDV cmsDescribe  Arm Pressure (mmHG) +----------+--------+-------+----------------+-------------------+ [id number]            Multiphasic, WNL                    +----------+--------+-------+----------------+-------------------+ +---------+--------+--+--------+-+---------+ VertebralPSV cm/s51EDV cm/s5Antegrade +---------+--------+--+--------+-+---------+  Left Carotid Findings: +----------+--------+--------+--------+------------------------------+---------+           PSV cm/sEDV cm/sStenosisPlaque Description            Comments  +----------+--------+--------+--------+------------------------------+---------+ CCA Prox  89                      heterogenous and irregular    tortuous   +----------+--------+--------+--------+------------------------------+---------+ CCA Distal70      8                                                       +----------+--------+--------+--------+------------------------------+---------+ ICA Prox  49      12              heterogenous, irregular and   Shadowing                                   calcific                                +----------+--------+--------+--------+------------------------------+---------+ ICA Distal98      18                                                      +----------+--------+--------+--------+------------------------------+---------+ ECA       67      10                                                      +----------+--------+--------+--------+------------------------------+---------+ +----------+--------+--------+----------------+-------------------+           PSV cm/sEDV cm/sDescribe        Arm Pressure (mmHG) +----------+--------+--------+----------------+-------------------+ [id number]             Multiphasic, WNL                    +----------+--------+--------+----------------+-------------------+ +---------+--------+--+--------+--+---------+ VertebralPSV cm/s94EDV cm/s14Antegrade +---------+--------+--+--------+--+---------+   Summary: Right Carotid: Velocities in the right ICA are consistent with a 1-39% stenosis. Left Carotid: Velocities in the left ICA are consistent with a 1-39% stenosis. Vertebrals:  Bilateral vertebral arteries demonstrate antegrade flow. Subclavians: Normal flow hemodynamics were seen in bilateral subclavian              arteries. *See table(s) above for measurements and observations.  Electronically signed by Antony Contras MD on 12/31/2021 at 3:59:42 PM.    Final    ECHOCARDIOGRAM COMPLETE  Result Date: 12/30/2021    ECHOCARDIOGRAM REPORT   Patient Name:   Chad [name] Date of Exam: 12/30/2021 Medical Rec #:  [id number]         Height:        66.0 in Accession #:  [id number]        Weight:       133.6 lb Date of Birth:  31-Dec-1931          BSA:          1.685 m Patient Age:    18 years          BP:           161/84 mmHg Patient Gender: M                 HR:           66 bpm. Exam Location:  Inpatient Procedure: 2D Echo Indications:    stroke  History:        Patient has prior history of Echocardiogram examinations, most                 recent 04/25/2020. Chronic kidney disease.; Risk                 Factors:Hypertension and Dyslipidemia.  Sonographer:    Johny Chess RDCS Referring Phys: [place]  1. Left ventricular ejection fraction, by estimation, is 55 to 60%. The left ventricle has normal function. The left ventricle has no regional wall motion abnormalities. There is mild left ventricular hypertrophy. Left ventricular diastolic parameters are consistent with Grade I diastolic dysfunction (impaired relaxation).  2. Right ventricular systolic function is normal. The right ventricular size is normal.  3. Left atrial size was mildly dilated.  4. The mitral valve is normal in structure. Mild mitral valve regurgitation. No evidence of mitral stenosis.  5. The aortic valve is tricuspid. Aortic valve regurgitation is mild. Aortic valve sclerosis/calcification is present, without any evidence of aortic stenosis.  6. The inferior vena cava is normal in size with greater than 50% respiratory variability, suggesting right atrial pressure of 3 mmHg. FINDINGS  Left Ventricle: Left ventricular ejection fraction, by estimation, is 55 to 60%. The left ventricle has normal function. The left ventricle has no regional wall motion abnormalities. The left ventricular internal cavity size was normal in size. There is  mild left ventricular hypertrophy. Left ventricular diastolic parameters are consistent with Grade I diastolic dysfunction (impaired relaxation). Right Ventricle: The right ventricular size is normal. Right ventricular  systolic function is normal. Left Atrium: Left atrial size was mildly dilated. Right Atrium: Right atrial size was normal in size. Pericardium: Trivial pericardial effusion is present. Mitral Valve: The mitral valve is normal in structure. Mild mitral annular calcification. Mild mitral valve regurgitation. No evidence of mitral valve stenosis. Tricuspid Valve: The tricuspid valve is normal in structure. Tricuspid valve regurgitation is trivial. No evidence of tricuspid stenosis. Aortic Valve: The aortic valve is tricuspid. Aortic valve regurgitation is mild. Aortic regurgitation PHT measures 400 msec. Aortic valve sclerosis/calcification is present, without any evidence of aortic stenosis. Pulmonic Valve: The pulmonic valve was normal in structure. Pulmonic valve regurgitation is trivial. No evidence of pulmonic stenosis. Aorta: The aortic root is normal in size and structure. Venous: The inferior vena cava is normal in size with greater than 50% respiratory variability, suggesting right atrial pressure of 3 mmHg. IAS/Shunts: No atrial level shunt detected by color flow Doppler.  LEFT VENTRICLE PLAX 2D LVIDd:         5.20 cm   Diastology LVIDs:         3.10 cm   LV e' medial:    4.50 cm/s LV PW:         1.20  cm   LV E/e' medial:  12.0 LV IVS:        1.10 cm   LV e' lateral:   4.20 cm/s LVOT diam:     2.10 cm   LV E/e' lateral: 12.9 LV SV:         80 LV SV Index:   47 LVOT Area:     3.46 cm  RIGHT VENTRICLE             IVC RV S prime:     15.70 cm/s  IVC diam: 1.00 cm TAPSE (M-mode): 2.0 cm LEFT ATRIUM             Index        RIGHT ATRIUM           Index LA diam:        3.40 cm 2.02 cm/m   RA Area:     16.30 cm LA Vol (A2C):   79.2 ml 47.01 ml/m  RA Volume:   39.80 ml  23.62 ml/m LA Vol (A4C):   47.5 ml 28.20 ml/m LA Biplane Vol: 63.0 ml 37.40 ml/m  AORTIC VALVE LVOT Vmax:   102.00 cm/s LVOT Vmean:  67.600 cm/s LVOT VTI:    0.230 m AI PHT:      400 msec  AORTA Ao Root diam: 3.70 cm Ao Asc diam:  3.10 cm  MITRAL VALVE MV Area (PHT): 4.60 cm    SHUNTS MV Decel Time: 165 msec    Systemic VTI:  0.23 m MR Peak grad: 134.6 mmHg   Systemic Diam: 2.10 cm MR Mean grad: 82.0 mmHg MR Vmax:      580.00 cm/s MR Vmean:     416.0 cm/s MV E velocity: 54.00 cm/s MV A velocity: 96.40 cm/s MV E/A ratio:  0.56 Kirk Ruths MD Electronically signed by Kirk Ruths MD Signature Date/Time: 12/30/2021/2:40:23 PM    Final    CT ANGIO HEAD NECK W WO CM  Result Date: 12/30/2021 CLINICAL DATA:  Stroke/TIA, determine embolic source EXAM: CT ANGIOGRAPHY HEAD AND NECK TECHNIQUE: Multidetector CT imaging of the head and neck was performed using the standard protocol during bolus administration of intravenous contrast. Multiplanar CT image reconstructions and MIPs were obtained to evaluate the vascular anatomy. Carotid stenosis measurements (when applicable) are obtained utilizing NASCET criteria, using the distal internal carotid diameter as the denominator. RADIATION DOSE REDUCTION: This exam was performed according to the departmental dose-optimization program which includes automated exposure control, adjustment of the mA and/or kV according to patient size and/or use of iterative reconstruction technique. CONTRAST:  97m OMNIPAQUE IOHEXOL 350 MG/ML SOLN COMPARISON:  Same day MRI/MRA. FINDINGS: CT HEAD FINDINGS Brain: Small right MCA territory infarcts better characterized on same day MRI. No evidence of acute hemorrhage, mass effect, midline shift, extra-axial fluid collection, or hydrocephalus. Patchy white matter hypodensities, nonspecific but compatible with chronic microvascular ischemic disease. Vascular: Detailed below. Skull: No acute fracture. Sinuses: Mild paranasal sinus mucosal thickening. Orbits: No acute orbital findings. Review of the MIP images confirms the above findings CTA NECK FINDINGS Aortic arch: Calcific atherosclerosis of the aorta and great vessel origins. Ectatic right subclavian artery. Right carotid system:  Atherosclerosis at the carotid bifurcation with approximately 80% stenosis. Left carotid system: Atherosclerosis at the carotid bifurcation without greater than 50% stenosis. Vertebral arteries: Potentially severe stenosis of the right vertebral artery origin due to calcific atherosclerosis. Otherwise, vertebral arteries are patent without significant (greater than 50%) stenosis. Left dominant. Skeleton: Severe multilevel degenerative change.  Other neck: No acute findings. Upper chest: Clear sinuses. Review of the MIP images confirms the above findings CTA HEAD FINDINGS Anterior circulation: Bilateral intracranial ICAs are patent with mild narrowing due to calcific atherosclerosis. Bilateral M1 MCAs and left M2 MCAs are patent. Apparent occlusion of a right M2 MCA branch in a region of calcification. Bilateral ACAs are patent. Posterior circulation: Left dominant intradural vertebral artery. Right intradural vertebral artery makes very small contribution to the basilar artery. Basilar artery and bilateral posterior cerebral arteries are patent without proximal hemodynamically significant stenosis. Venous sinuses: As permitted by contrast timing, patent. Anatomic variants: Detailed above. Review of the MIP images confirms the above findings IMPRESSION: CTA: 1. 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 2. Extensive calcific atherosclerosis at the right carotid bifurcation with approximately 80% stenosis of the proximal ICA. 3. 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 CT head: Small right MCA territory infarcts better characterized on same day MRI. No evidence of progressive mass effect or acute hemorrhage. Findings discussed with Dr. Erlinda Hong via telephone at 12:50 p.m. Electronically Signed   By: Margaretha Sheffield M.D.   On:  12/30/2021 13:00   MR BRAIN WO CONTRAST  Result Date: 12/30/2021 CLINICAL DATA:  There are deficit with acute stroke suspected EXAM: MRI HEAD WITHOUT CONTRAST MRA HEAD WITHOUT CONTRAST TECHNIQUE: Multiplanar, multi-echo pulse sequences of the brain and surrounding structures were acquired without intravenous contrast. Angiographic images of the Circle of Willis were acquired using MRA technique without intravenous contrast. COMPARISON:  Head CT from yesterday FINDINGS: MRI HEAD FINDINGS Brain: Cluster of small acute infarcts in the right insular cortex, right frontal cortex, and deep right cerebral white matter. These are in a MCA distribution. Background of advanced chronic small vessel ischemia with confluent gliosis in the hemispheric white matter. Chronic lacunar infarcts in the deep cerebrum. Small remote left cerebral infarct. Gradient signal at the lower right sylvian fissure with there is calcification along the MCA branches by CT. Vascular: Major flow voids are preserved Skull and upper cervical spine: Normal marrow signal Sinuses/Orbits: Bilateral cataract resection MRA HEAD FINDINGS Anterior circulation: High-grade narrowing involving the upper right M2 branch with there is a gradient hypointensity noted above. Elsewhere vessels are smoothly contoured and widely patent. Posterior circulation: Vertebrobasilar arteries are smoothly contoured and widely patent. IMPRESSION: Brain MRI: 1. Cluster of small cortical and white matter infarcts within the right MCA distribution. 2. Background of advanced chronic small vessel ischemia. Intracranial MRA: Focal high-grade narrowing at the right M2 level where there is calcification by CT. This could reflect calcified plaque or a calcified embolus. If an embolus, the proximal nature and degree of mild infarct with suggest additional territory of risk. Electronically Signed   By: Jorje Guild M.D.   On: 12/30/2021 07:53   MR ANGIO HEAD WO CONTRAST  Result  Date: 12/30/2021 CLINICAL DATA:  There are deficit with acute stroke suspected EXAM: MRI HEAD WITHOUT CONTRAST MRA HEAD WITHOUT CONTRAST TECHNIQUE: Multiplanar, multi-echo pulse sequences of the brain and surrounding structures were acquired without intravenous contrast. Angiographic images of the Circle of Willis were acquired using MRA technique without intravenous contrast. COMPARISON:  Head CT from yesterday FINDINGS: MRI HEAD FINDINGS Brain: Cluster of small acute infarcts in the right insular cortex, right frontal cortex, and deep right cerebral white matter. These are in a MCA distribution. Background  of advanced chronic small vessel ischemia with confluent gliosis in the hemispheric white matter. Chronic lacunar infarcts in the deep cerebrum. Small remote left cerebral infarct. Gradient signal at the lower right sylvian fissure with there is calcification along the MCA branches by CT. Vascular: Major flow voids are preserved Skull and upper cervical spine: Normal marrow signal Sinuses/Orbits: Bilateral cataract resection MRA HEAD FINDINGS Anterior circulation: High-grade narrowing involving the upper right M2 branch with there is a gradient hypointensity noted above. Elsewhere vessels are smoothly contoured and widely patent. Posterior circulation: Vertebrobasilar arteries are smoothly contoured and widely patent. IMPRESSION: Brain MRI: 1. Cluster of small cortical and white matter infarcts within the right MCA distribution. 2. Background of advanced chronic small vessel ischemia. Intracranial MRA: Focal high-grade narrowing at the right M2 level where there is calcification by CT. This could reflect calcified plaque or a calcified embolus. If an embolus, the proximal nature and degree of mild infarct with suggest additional territory of risk. Electronically Signed   By: Jorje Guild M.D.   On: 12/30/2021 07:53   DG Chest Portable 1 View  Result Date: 12/29/2021 CLINICAL DATA:  CVA. Fell last night.  Family concerned patient injured left side EXAM: PORTABLE CHEST 1 VIEW COMPARISON:  Chest two views 03/23/2013 FINDINGS: Cardiac silhouette is mildly enlarged. Moderate calcifications within the aortic arch and descending thoracic aorta. Minimal bibasilar horizontal linear chronic scarring is unchanged. No focal airspace opacity to indicate pneumonia. No pleural effusion or pneumothorax. Moderate multilevel degenerative disc changes of the thoracic spine. IMPRESSION: Mild stable cardiomegaly. No acute lung process. Electronically Signed   By: Yvonne Kendall M.D.   On: 12/29/2021 19:19   DG Hand Complete Left  Result Date: 12/29/2021 CLINICAL DATA:  Fall.  Bruising to posterior left hand. EXAM: LEFT HAND - COMPLETE 3+ VIEW COMPARISON:  None Available. FINDINGS: There is diffuse decreased bone mineralization. A pulse oximeter overlies the distal index finger obscuring portions. Degenerative changes including joint space narrowing, subchondral sclerosis and peripheral osteophytosis are moderate at the triscaphe joint; moderate to severe at the thumb carpometacarpal, thumb metacarpophalangeal, and thumb interphalangeal joints; and moderate to severe at the DIP joints and moderate at the PIP joints of the second through fifth fingers. No acute fracture is seen. No dislocation. IMPRESSION: Osteoarthritis as above. No acute fracture. Electronically Signed   By: Yvonne Kendall M.D.   On: 12/29/2021 18:12   DG Elbow Complete Left  Result Date: 12/29/2021 CLINICAL DATA:  Fall.  Left elbow pain and bruising. EXAM: LEFT ELBOW - COMPLETE 3+ VIEW COMPARISON:  None Available. FINDINGS: No fracture or dislocation. Narrowed radiocapitellar joint. Marginal osteophytes project from the base of the radial head. No joint effusion. There is soft tissue edema posteriorly. IMPRESSION: No fracture or dislocation. Electronically Signed   By: Lajean Manes M.D.   On: 12/29/2021 18:12   CT Cervical Spine Wo Contrast  Result Date:  12/29/2021 CLINICAL DATA:  There logic deaf sick, neck trauma EXAM: CT CERVICAL SPINE WITHOUT CONTRAST TECHNIQUE: Multidetector CT imaging of the cervical spine was performed without intravenous contrast. Multiplanar CT image reconstructions were also generated. RADIATION DOSE REDUCTION: This exam was performed according to the departmental dose-optimization program which includes automated exposure control, adjustment of the mA and/or kV according to patient size and/or use of iterative reconstruction technique. COMPARISON:  None Available. FINDINGS: Alignment: Mild degenerative anterolisthesis of C2 and C3. Otherwise alignment is anatomic. Skull base and vertebrae: No acute fracture. No primary bone lesion or focal pathologic  process. Soft tissues and spinal canal: No prevertebral fluid or swelling. No visible canal hematoma. Disc levels: Partial bony fusion across the disc spaces at C3-4 and C4-5. There is severe multilevel spondylosis throughout the remainder of the cervical spine, greatest at C5-6 and C6-7. Diffuse facet hypertrophy. Upper chest: Airway is patent. Lung apices are clear. Prominent atherosclerosis of the aortic arch. Other: Reconstructed images demonstrate no additional findings. IMPRESSION: 1. Extensive multilevel cervical degenerative changes. No acute fracture. Electronically Signed   By: Randa Ngo M.D.   On: 12/29/2021 17:38   CT HEAD WO CONTRAST  Result Date: 12/29/2021 CLINICAL DATA:  Neurologic deficit EXAM: CT HEAD WITHOUT CONTRAST TECHNIQUE: Contiguous axial images were obtained from the base of the skull through the vertex without intravenous contrast. RADIATION DOSE REDUCTION: This exam was performed according to the departmental dose-optimization program which includes automated exposure control, adjustment of the mA and/or kV according to patient size and/or use of iterative reconstruction technique. COMPARISON:  None Available. FINDINGS: Brain: Confluent hypodensities  throughout the periventricular white matter are most consistent with age-indeterminate small vessel ischemic changes, likely chronic. Focal hypodensities in the left basal ganglia and right frontal periventricular white matter consistent with chronic lacunar infarcts. Age-indeterminate lacunar infarct is seen within the right basal ganglia image 18/3. No other signs of acute infarct or hemorrhage. Lateral ventricles and midline structures are otherwise unremarkable. No acute extra-axial fluid collections. No mass effect. Vascular: No hyperdense vessel or unexpected calcification. Skull: Normal. Negative for fracture or focal lesion. Sinuses/Orbits: No acute finding. Other: None. IMPRESSION: 1. Age indeterminate lacunar infarct within the right basal ganglia. 2. Age-indeterminate small-vessel ischemic changes throughout the periventricular white matter, favor chronic. 3. Chronic left basal ganglia and right frontal white matter lacunar infarcts as above. 4. No acute hemorrhage. Electronically Signed   By: Randa Ngo M.D.   On: 12/29/2021 17:35     PHYSICAL EXAM  Temp:  [97.8 F (36.6 C)-99.1 F (37.3 C)] 98.4 F (36.9 C) (06/16 1228) Pulse Rate:  [58-80] 62 (06/16 1045) Resp:  [0-21] 9 (06/16 1045) BP: (84-165)/(47-101) 115/57 (06/16 1045) SpO2:  [92 %-100 %] 100 % (06/16 1150) FiO2 (%):  [30 %] 30 % (06/16 1150)  General - Well nourished, well developed, intubated on sedation.  Ophthalmologic - fundi not visualized due to noncooperation.  Cardiovascular - Regular rate and rhythm.  Neuro - intubated on sedation, eyes closed but half way open to voice, not following commands. With forced eye opening, eyes in mid position, not blinking to visual threat, doll's eyes present, not tracking, pinpoint pupil bilaterally. Corneal reflex weak on the left, absent on the right, gag and cough present. Breathing over the vent.  Facial symmetry not able to test due to ET tube.  Tongue protrusion not  cooperative. On pain stimulation, right upper extremity extension posturing, right lower extremity mild withdraw, left upper extremity flaccid, left lower extremity flicker toe movement. DTR diminished and no babinski. Sensation, coordination and gait not tested.    ASSESSMENT/PLAN Chad [name] is a 88 y.o. male with history of hypertension, hyperlipidemia, hearing loss, recent stroke status post right carotid angioplasty transfer back from CIR due to aspiration, respite failure.  Found to have severe anemia with GI bleeding, renal failure and seizure-like activity.  Stroke:  right MCA infarct extended from recent infarct, likely secondary to hypertension in the setting of sepsis and GI bleeding with known right ICA stenosis s/p angioplasty  Recent stroke: right MCA scattered small infarcts due  to right M2 occlusion by calcified plaque,  secondary to large vessel disease source from right ICA high-grade stenosis CT 5/28 no acute abnormality, old bilateral BG, right frontal white matter lacunar infarcts. MRA right M 2 high-grade stenosis MRI 5/29 right MCA scattered small infarcts CT head and neck 5/29 proximal right M2 occlusion due to calcified embolus. Extensive calcified atherosclerosis at right ICA bifurcation with 80% stenosis.  Severe stenosis nondominant right VA origin.  CT head 6/15 - moderate-sized acute/subacute cortical and subcortical infarct within the right frontoparietal lobes and right insula  MRI pending to further evaluate once pt BP stable and LTM off Consider to repeat CTA head and neck to evaluate right ICA s/p angioplasty Carotid Doppler unremarkable 2D Echo EF 55 to 60% LDL 90 HgbA1c 6.1 aspirin 81 mg daily and Brilinta (ticagrelor) 90 mg bid prior to admission, now on aspirin 81 mg daily and Brilinta (ticagrelor) 90 mg bid. If concerning of bleeding risk, can do ASA and plavix. Discussed with Dr. Lake Bells, will close monitoring bleeding before make regimen  change. Ongoing aggressive stroke risk factor management Therapy recommendations:  pending Disposition: Pending  Seizure like activity 6/15 episode of left gaze, right upper extremity posturing, status post Ativan EEG stat no seizure, moderate to severe diffuse encephalopathy On LTM Once off LTM, can consider MRI  Respiratory failure Aspiration pneumonia on Unasyn CCM on board Intubated for airway protection Vent management per CCM  Carotid stenosis CT head and neck 5/29 extensive calcified atherosclerosis at right ICA bifurcation with 80% stenosis. Likely the source for right M2 occlusion and right MCA stroke Dr. Estanislado Pandy did right carotid angioplasty 5/31, not able to have stent placement due to tortuosity of the vessel Plan to follow up with Dr. Estanislado Pandy in 1-2 months, if re-stenosis, may need to consider right CEA Consider to repeat CTA head and neck to evaluate the right ICA status post angioplasty was stable   GIB Large dark stool Severe anemia Hb 8.2->8.0->5.6->PRBC->9.9-> 9.5 GI on board EGD 6/16 found food in stomach and no significance and source of bleeding Close monitoring  History of hypertension Hypotension Likely due to septic shock and severe anemia On IV fluid, received PRBC transfusion Still on Levophed BP goal normotensive   Hyperlipidemia Home meds: Lovastatin 40 LDL 90, goal < 70 Now on Crestor 20 Continue statin at discharge   AKI on CKD Creatinine 1.3->3.84->3.24->3.12 CCM on board On IVF Avoid nephrotoxicity agents  Dysphagia On tube feeding now  Other Stroke Risk Factors Advanced age   Other Active Problems Hard of hearing  Hospital day # 2  This patient is critically ill due to stroke extension, right carotid stenosis, GI bleeding with severe anemia, AKI on CKD, seizure-like activity, respiratory failure and at significant risk of neurological worsening, death form sepsis, septic shock, renal failure, recurrent stroke,  hemorrhagic transformation, status epilepticus. This patient's care requires constant monitoring of vital signs, hemodynamics, respiratory and cardiac monitoring, review of multiple databases, neurological assessment, discussion with family, other specialists and medical decision making of high complexity. I spent 40 minutes of neurocritical care time in the care of this patient.  I discussed with Dr. Lake Bells CCM.  Rosalin Hawking, MD PhD Stroke Neurology 01/10/2022 2:03 PM    To contact Stroke Continuity provider, please refer to [email or web address] After hours, contact General Neurology

## 2022-01-17 NOTE — Progress Notes (Signed)
Palliative:  Chart reviewed. Went to bedside and patient having bedside endoscopy. Discussed with Georgann Housekeeper, NP -he had multiple conversations with family at bedside yesterday with no decisions made.  Eddie Dibbles reports no palliative needs at this time.  We will be available, happy to arrange family meeting as appropriate.  Juel Burrow, DNP, AGNP-C Palliative Medicine Team Team Phone # [phone]  Pager # [phone]  No charge

## 2022-01-17 NOTE — Discharge Summary (Signed)
Physician Discharge Summary  Patient ID: Chad [name] MRN: [id number] DOB/AGE: 04/21/1932 88 y.o.   Admit date: 01/09/2022 Discharge date: 01/11/2022   Discharge Diagnoses:  Principal Problem:   Acute respiratory failure with hypoxia [facility]) DVT prophylaxis Dysphagia Hypertension Hyperlipidemia BPH CKD stage III Constipation   Discharged Condition: Guarded   Significant Diagnostic Studies:  Imaging Results  DG Chest Port 1 View   Result Date: 01/24/2022 CLINICAL DATA:  Acute respiratory distress EXAM: PORTABLE CHEST 1 VIEW COMPARISON:  03/23/2022 FINDINGS: Cardiomegaly. Vascular congestion. Aortic atherosclerosis. Perihilar airspace opacities bilaterally. No effusions. No acute bony abnormality. IMPRESSION: Perihilar airspace opacities, likely edema although pneumonia not excluded. Electronically Signed   By: Rolm Baptise M.D.   On: 01/20/2022 03:29    DG Swallowing Func-Speech Pathology   Result Date: 01/10/2022 Table formatting from the original result was not included. Objective Swallowing Evaluation: Type of Study: MBS-Modified Barium Swallow Study  Patient Details Name: Chad [name] MRN: [id number] Date of Birth: [birth year] Today's Date: 01/10/2022 Time: SLP Start Time (ACUTE ONLY): 1100 -SLP Stop Time (ACUTE ONLY): 1123 SLP Time Calculation (min) (ACUTE ONLY): 23 min Past Medical History: Past Medical History: Diagnosis Date  Arthritis   Heart murmur   asa child   Hypertension   Stroke [facility])  Past Surgical History: Past Surgical History: Procedure Laterality Date  EYE SURGERY Bilateral 2022  IR ANGIO INTRA EXTRACRAN SEL COM CAROTID INNOMINATE UNI R MOD SED  01/01/2022  IR CT HEAD LTD  01/01/2022  IR PTA INTRACRANIAL  01/01/2022  JOINT REPLACEMENT    RADIOLOGY WITH ANESTHESIA N/A 01/01/2022  Procedure: Angiogram;  Surgeon: Luanne Bras, MD;  Location: [place];  Service: Radiology;  Laterality: N/A;  TONSILLECTOMY    TOTAL KNEE ARTHROPLASTY Right 03/30/2013  Procedure: RIGHT TOTAL  KNEE ARTHROPLASTY;  Surgeon: Tobi Bastos, MD;  Location: [facility];  Service: Orthopedics;  Laterality: Right;  TOTAL KNEE ARTHROPLASTY Left 10/09/2020  Procedure: TOTAL KNEE ARTHROPLASTY;  Surgeon: Paralee Cancel, MD;  Location: [facility];  Service: Orthopedics;  Laterality: Left;  70 mins HPI: 88 yo male presenting to the ED on 5/28 with L sided weakness, facial droop, slurred speech, and fall. 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 PMH including hypothyroidism chronic pain syndromes, CKD, HTN, arthritis, bil TKA.  Subjective: alert, friendly  Recommendations for follow up therapy are one component of a multi-disciplinary discharge planning process, led by the attending physician.  Recommendations may be updated based on patient status, additional functional criteria and insurance authorization. Assessment / Plan / Recommendation   01/10/2022   8:50 PM Clinical Impressions Clinical Impressions SLP Visit Diagnosis MBSS completed secondary to clinical concerns for aspiration of current diet textures (puree and nectar-thick liquids). At this time, pt presents with severe oropharyngeal dysphagia, with significant UES dysfunction, which resulted in significant posterior tracheal sensed aspiration (PAS 7) of pureed texture via interarytenoid space due to overflow of pyriform sinus residuals into laryngeal vestibule. Unfortunately, pharyngeal stasis and pyriform sinus residuals could not clear due to poor UES relaxation with mild esophageal backflow to pyriform sinuses. Additionally, delayed sensation of gross aspiration of nectar-thick liquid via tsp was appreciated during the swallow. [name] cough response was not effective in pulmonary clearance. Pt required tactile, hyoglossal assistance and verbal cues, from SLP, to initiate swallow response during puree trials. It should be noted that this is a significant change from pt's MBSS results documented on 01/02/2022.    Given pt's risk severity  for development of aspiration PNA (Total A for oral care, decreased ambulation, deconditioning) and change in swallow function, recommend initiation of NPO with temporary alternate means of nutrition, hydration, and medications at this time. May consider GI referral to assess UES function if improvement is not noted with therapy alone. [name] daughter present and results + recommendations were reviewed with pt an daughter who verbalized agreement with recommendations following education. MD, LPN, and PA notified of recommendations. Please see imaging report for full details. Dysphagia, oropharyngeal phase (R13.12);Cognitive communication deficit (R41.841) Impact on safety and function Severe aspiration risk;Risk for inadequate nutrition/hydration     01/02/2022  11:00 AM Treatment Recommendations Treatment Recommendations Therapy as outlined in treatment plan below     01/10/2022   1:59 PM Prognosis Prognosis for Safe Diet Advancement Good Barriers to Reach Goals Cognitive deficits;Severity of deficits   01/10/2022   8:50 PM Diet Recommendations SLP Diet Recommendations NPO;Alternative means - temporary Medication Administration Via alternative means Postural Changes Other (Comment)     01/10/2022   8:50 PM Other Recommendations Recommended Consults Consider GI evaluation;Consider esophageal assessment Oral Care Recommendations Oral care QID Other Recommendations Have oral suction available   01/10/2022   8:50 PM Frequency and Duration  Treatment Duration 2 weeks     01/10/2022   1:53 PM Oral Phase Oral Phase Impaired Oral - Nectar Teaspoon Weak lingual manipulation;Lingual pumping;Delayed oral transit;Decreased bolus cohesion;Premature spillage;Lingual/palatal residue;Piecemeal swallowing;Reduced posterior propulsion Oral - Nectar Cup NT Oral - Nectar Straw NT Oral - Thin Cup NT Oral - Thin Straw NT Oral - Puree Weak lingual manipulation;Lingual pumping;Incomplete tongue to palate contact;Lingual/palatal  residue;Delayed oral transit;Decreased bolus cohesion;Premature spillage;Holding of bolus;Piecemeal swallowing Oral - Mech Soft NT Oral - Regular NT Oral - Multi-Consistency NT Oral - Pill NT    01/10/2022   1:54 PM Pharyngeal Phase Pharyngeal Phase Impaired Pharyngeal- Nectar Teaspoon Delayed swallow initiation-pyriform sinuses;Reduced pharyngeal peristalsis;Reduced airway/laryngeal closure;Moderate aspiration;Inter-arytenoid space residue;Pharyngeal residue - cp segment;Pharyngeal residue - posterior pharnyx;Pharyngeal residue - pyriform;Pharyngeal residue - valleculae;Lateral channel residue;Penetration/Aspiration during swallow;Reduced laryngeal elevation Pharyngeal Material enters airway, passes BELOW cords and not ejected out despite cough attempt by patient Pharyngeal- Nectar Cup NT Pharyngeal- Nectar Straw NT Pharyngeal- Thin Cup NT Pharyngeal- Thin Straw NT Pharyngeal- Puree Delayed swallow initiation-pyriform sinuses;Reduced pharyngeal peristalsis;Reduced anterior laryngeal mobility;Reduced tongue base retraction;Penetration/Apiration after swallow;Moderate aspiration;Pharyngeal residue - pyriform;Pharyngeal residue - posterior pharnyx;Pharyngeal residue - cp segment;Inter-arytenoid space residue;Lateral channel residue;Compensatory strategies attempted (with notebox) Pharyngeal Material enters airway, passes BELOW cords and not ejected out despite cough attempt by patient Pharyngeal- Mechanical Soft NT Pharyngeal- Regular NT Pharyngeal- Multi-consistency NT Pharyngeal- Pill NT    01/10/2022   1:58 PM Cervical Esophageal Phase  Cervical Esophageal Phase Impaired Nectar Teaspoon Reduced cricopharyngeal relaxation Nectar Cup NT Nectar Straw NT Puree Esophageal backflow into the pharynx;Reduced cricopharyngeal relaxation Mechanical Soft NT Regular NT Multi-consistency NT Pill NT Cervical Esophageal Comment Decreased relaxation of the UES observed with significant pyriform sinus residuals Bethany A Lutes 01/10/2022,  9:06 PM                      DG Abd Portable 1V   Result Date: 01/10/2022 CLINICAL DATA:  Feeding tube placement. EXAM: PORTABLE ABDOMEN - 1 VIEW COMPARISON:  None Available. FINDINGS: Feeding tube tip is at the level of the gastric antrum. Small rounded densities are seen in the region of the colon likely related to diverticula. There is a heterogeneous rounded density projecting over the right upper quadrant measuring 3.0  x 3.0 cm. This is indeterminate. Lung bases are clear. There is mild curvature of the lumbar spine with degenerative change. IMPRESSION: 1. Feeding tube tip is at the level of the gastric antrum. 2. Scattered colonic diverticula. 3. 3 cm heterogeneous density in the right upper quadrant, indeterminate. Findings may represent a large gallstone, renal calcification or other calcified mass. Please correlate clinically. This can be further evaluated with CT. Electronically Signed   By: Ronney Asters M.D.   On: 01/10/2022 15:07    IR ANGIO INTRA EXTRACRAN SEL COM CAROTID INNOMINATE UNI R MOD SED   Result Date: 01/03/2022 CLINICAL DATA:  Left-sided weakness with dysarthria. Patient with calcified embolus in the inferior division right MCA. Discovered to have high-grade stenosis of the extracranial right ICA proximally EXAM: IR ANGIO INTRA EXTRACRAN SEL COM CAROTID INNOMINATE UNI RIGHT MOD SED COMPARISON:  CT angiogram of the head and neck of Dec 30, 2021. MEDICATIONS: Heparin 3,000 units IV. Ancef 2 g IV antibiotic was administered within 1 hour of the procedure. ANESTHESIA/SEDATION: General anesthesia. CONTRAST:  Omnipaque 300 approximately 120 cc. FLUOROSCOPY TIME:  Fluoroscopy Time: 76 minutes 36) seconds (1257 mGy). COMPLICATIONS: None immediate. TECHNIQUE: Informed written consent was obtained from the patient after a thorough discussion of the procedural risks, benefits and alternatives. All questions were addressed. Maximal Sterile Barrier Technique was utilized including caps, mask,  sterile gowns, sterile gloves, sterile drape, hand hygiene and skin antiseptic. A timeout was performed prior to the initiation of the procedure. The left groin was prepped and draped in the usual sterile fashion. Thereafter using modified Seldinger technique, transfemoral access into the left common femoral artery was obtained without difficulty. Over a 0.035 inch guidewire, an 8 French 25 cm Pinnacle sheath was inserted. Through this, and also over 0.035 inch guidewire, a 120 cm 6 French Simmons 2 catheter inside of an 087 95 cm balloon guide catheter combination was advanced to the aortic arch region and selectively positioned in the proximal right common carotid artery and the innominate artery. An arteriogram was then performed centered extra cranially and intracranially. FINDINGS: The innominate arteriogram demonstrates significant tortuosity of the innominate artery in its entirety extending into the right common carotid artery with a 180 degree loop in the proximal right common carotid artery. Subclavian artery demonstrates patency with opacification of a hypoplastic right vertebral artery extra cranially. The right common carotid arteriogram demonstrates complex atherosclerotic calcified plaque at the right common carotid bifurcation extending into the bulb region of the right internal carotid artery resulting in a high-grade approximately 80-90% stenosis. More distally, the right internal carotid artery opacifies to the cranial skull base. Patency is seen of the petrous, cavernous and supraclinoid right ICA with a prominent right posterior communicating artery. Proximal portions of the right middle cerebral artery and the right anterior cerebral artery demonstrate patency. ENDOVASCULAR REVASCULARIZATION OF HIGH-GRADE STENOSIS OF THE RIGHT INTERNAL CAROTID ARTERY PROXIMALLY WITH BALLOON ANGIOPLASTY The balloon guide catheter was advanced into the proximal right common carotid artery. Through this, and also  over a 0.014 inch Transend EX soft tip micro guidewire, an 021 150 cm microcatheter was then advanced with biplane DSA roadmap to the proximal cavernous right ICA followed by the microcatheter. The micro guidewire was removed. Good aspiration obtained from the hub of the microcatheter. A gentle control arteriogram performed through the microcatheter demonstrated safe positioning of the tip of the microcatheter. This in turn was then exchanged out for a 300 cm 014 inch BMW exchange wire with a moderate  J configuration. The tip of the micro guidewire was positioned at the petrous cavernous junction. Measurements were then performed of the right internal carotid artery distal to the stenosis, and just proximal to the stenosis of the distal common carotid artery. A 5 mm x 20 mm 014 inch Viatrac balloon guide catheter was then prepped and purged retrogradely with heparinized saline infusion, and retrogradely with 50% contrast and 50% heparinized saline infusion. Using the rapid exchange technique, the balloon catheter was then advanced without difficulty and positioned with the distal and proximal markers adequate distant from the site of the severe stenosis in the proximal right internal carotid artery. Slow control inflation was then performed using micro inflation syringe device via micro tubing. Inflations were performed slowly to 9 atmospheres where it was maintained for approximately 60 seconds. Thereafter, the balloon was deflated and retrieved and removed. A control arteriogram performed through the balloon guide catheter in the proximal right common carotid artery demonstrated significantly improved caliber and flow through the angioplastied segment. More distally, free flow was noted into the distal right ICA intracranially. Over the exchange BMW micro guidewire, a 8 mm x 26 mm Wallstent stent delivery system which had been prepped with heparinized saline infusion was then advanced again using the rapid  exchange technique to the proximal right common carotid artery. Resistance was encountered to further advancement of the stent delivery apparatus. At this time the balloon guide catheter was advanced to the distal right common carotid artery. Further attempts were made to advance the stent delivery apparatus into the right internal carotid proximally. Advancement could only manage just distal to the angioplastied segment with no significant coverage of the angioplastied segment. Further attempts at advancing were met with resistance secondary to a step-off related to the calcified plaque. After multiple attempts it was decided to stop the procedure. Final control arteriogram performed through the balloon guide catheter in the right common carotid artery continued to demonstrate excellent flow through the right external carotid artery with a mild-to-moderate stenosis at its origin. The right internal carotid artery proximally demonstrated approximately 60% patency which was maintained on subsequent 20 minute arteriogram. Centered intracranially demonstrated brisk flow into the right anterior cerebral artery and the right middle cerebral distribution. The previously occluded anterior branch of the inferior division proximally due to a calcified plaque remained stable. The delayed arterial phase demonstrated retrograde opacification of the posterior 2/3 of the cortical branches in the sylvian triangle from the branches of the inferior division and the superior division, and also from the pericallosal branches. The balloon guide catheter was removed. The 8 French Pinnacle sheath in the left common femoral artery was removed with manual compression held for approximately 25 minutes with a quick clot to achieved hemostasis at this site. Distal pulses remained palpable in the dorsalis pedis arteries bilaterally unchanged. An immediate CT of the brain demonstrated no evidence of hemorrhagic complications. The patient was  then gradually extubated without difficulty. Patient was able to maintain his airway, and oxygen concentrations. He was able to move his right side spontaneously and to command. The patient was able to follow simple commands appropriately. No significant motor movement was evident in the left upper extremity, with only a flicker in the left foot toes. Patient was then transferred to the PACU and then subsequently the neuro ICU for post revascularization care. Later in the afternoon, the patient appeared more awake, alert and appropriately responsive. Had mild intermittent right gaze deviation but was able to track his eyes to  the left past the midline. Otherwise, neurologically he remained stable unchanged. 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 PLAN: Follow-up in the clinic 4 to 6 weeks post discharge. Electronically Signed   By: Luanne Bras M.D.   On: 01/03/2022 08:12    IR CT Head Ltd   Result Date: 01/03/2022 CLINICAL DATA:  Left-sided weakness with dysarthria. Patient with calcified embolus in the inferior division right MCA. Discovered to have high-grade stenosis of the extracranial right ICA proximally EXAM: IR ANGIO INTRA EXTRACRAN SEL COM CAROTID INNOMINATE UNI RIGHT MOD SED COMPARISON:  CT angiogram of the head and neck of Dec 30, 2021. MEDICATIONS: Heparin 3,000 units IV. Ancef 2 g IV antibiotic was administered within 1 hour of the procedure. ANESTHESIA/SEDATION: General anesthesia. CONTRAST:  Omnipaque 300 approximately 120 cc. FLUOROSCOPY TIME:  Fluoroscopy Time: 76 minutes 36) seconds (1257 mGy). COMPLICATIONS: None immediate. TECHNIQUE: Informed written consent was obtained from the patient after a thorough discussion of the procedural risks, benefits and alternatives. All questions were addressed. Maximal Sterile  Barrier Technique was utilized including caps, mask, sterile gowns, sterile gloves, sterile drape, hand hygiene and skin antiseptic. A timeout was performed prior to the initiation of the procedure. The left groin was prepped and draped in the usual sterile fashion. Thereafter using modified Seldinger technique, transfemoral access into the left common femoral artery was obtained without difficulty. Over a 0.035 inch guidewire, an 8 French 25 cm Pinnacle sheath was inserted. Through this, and also over 0.035 inch guidewire, a 120 cm 6 French Simmons 2 catheter inside of an 087 95 cm balloon guide catheter combination was advanced to the aortic arch region and selectively positioned in the proximal right common carotid artery and the innominate artery. An arteriogram was then performed centered extra cranially and intracranially. FINDINGS: The innominate arteriogram demonstrates significant tortuosity of the innominate artery in its entirety extending into the right common carotid artery with a 180 degree loop in the proximal right common carotid artery. Subclavian artery demonstrates patency with opacification of a hypoplastic right vertebral artery extra cranially. The right common carotid arteriogram demonstrates complex atherosclerotic calcified plaque at the right common carotid bifurcation extending into the bulb region of the right internal carotid artery resulting in a high-grade approximately 80-90% stenosis. More distally, the right internal carotid artery opacifies to the cranial skull base. Patency is seen of the petrous, cavernous and supraclinoid right ICA with a prominent right posterior communicating artery. Proximal portions of the right middle cerebral artery and the right anterior cerebral artery demonstrate patency. ENDOVASCULAR REVASCULARIZATION OF HIGH-GRADE STENOSIS OF THE RIGHT INTERNAL CAROTID ARTERY PROXIMALLY WITH BALLOON ANGIOPLASTY The balloon guide catheter was advanced into the proximal  right common carotid artery. Through this, and also over a 0.014 inch Transend EX soft tip micro guidewire, an 021 150 cm microcatheter was then advanced with biplane DSA roadmap to the proximal cavernous right ICA followed by the microcatheter. The micro guidewire was removed. Good aspiration obtained from the hub of the microcatheter. A gentle control arteriogram performed through the microcatheter demonstrated safe positioning of the tip of the microcatheter. This in turn was then exchanged out for a 300 cm 014 inch BMW exchange wire with a moderate J configuration. The tip of the micro guidewire was positioned at the petrous cavernous junction. Measurements were then performed of the right internal carotid artery distal to  the stenosis, and just proximal to the stenosis of the distal common carotid artery. A 5 mm x 20 mm 014 inch Viatrac balloon guide catheter was then prepped and purged retrogradely with heparinized saline infusion, and retrogradely with 50% contrast and 50% heparinized saline infusion. Using the rapid exchange technique, the balloon catheter was then advanced without difficulty and positioned with the distal and proximal markers adequate distant from the site of the severe stenosis in the proximal right internal carotid artery. Slow control inflation was then performed using micro inflation syringe device via micro tubing. Inflations were performed slowly to 9 atmospheres where it was maintained for approximately 60 seconds. Thereafter, the balloon was deflated and retrieved and removed. A control arteriogram performed through the balloon guide catheter in the proximal right common carotid artery demonstrated significantly improved caliber and flow through the angioplastied segment. More distally, free flow was noted into the distal right ICA intracranially. Over the exchange BMW micro guidewire, a 8 mm x 26 mm Wallstent stent delivery system which had been prepped with heparinized saline  infusion was then advanced again using the rapid exchange technique to the proximal right common carotid artery. Resistance was encountered to further advancement of the stent delivery apparatus. At this time the balloon guide catheter was advanced to the distal right common carotid artery. Further attempts were made to advance the stent delivery apparatus into the right internal carotid proximally. Advancement could only manage just distal to the angioplastied segment with no significant coverage of the angioplastied segment. Further attempts at advancing were met with resistance secondary to a step-off related to the calcified plaque. After multiple attempts it was decided to stop the procedure. Final control arteriogram performed through the balloon guide catheter in the right common carotid artery continued to demonstrate excellent flow through the right external carotid artery with a mild-to-moderate stenosis at its origin. The right internal carotid artery proximally demonstrated approximately 60% patency which was maintained on subsequent 20 minute arteriogram. Centered intracranially demonstrated brisk flow into the right anterior cerebral artery and the right middle cerebral distribution. The previously occluded anterior branch of the inferior division proximally due to a calcified plaque remained stable. The delayed arterial phase demonstrated retrograde opacification of the posterior 2/3 of the cortical branches in the sylvian triangle from the branches of the inferior division and the superior division, and also from the pericallosal branches. The balloon guide catheter was removed. The 8 French Pinnacle sheath in the left common femoral artery was removed with manual compression held for approximately 25 minutes with a quick clot to achieved hemostasis at this site. Distal pulses remained palpable in the dorsalis pedis arteries bilaterally unchanged. An immediate CT of the brain demonstrated no evidence  of hemorrhagic complications. The patient was then gradually extubated without difficulty. Patient was able to maintain his airway, and oxygen concentrations. He was able to move his right side spontaneously and to command. The patient was able to follow simple commands appropriately. No significant motor movement was evident in the left upper extremity, with only a flicker in the left foot toes. Patient was then transferred to the PACU and then subsequently the neuro ICU for post revascularization care. Later in the afternoon, the patient appeared more awake, alert and appropriately responsive. Had mild intermittent right gaze deviation but was able to track his eyes to the left past the midline. Otherwise, neurologically he remained stable unchanged. 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 PLAN: Follow-up in the clinic 4 to 6 weeks post discharge. Electronically Signed   By: Luanne Bras M.D.   On: 01/03/2022 08:12    IR PTA Intracranial   Result Date: 01/03/2022 CLINICAL DATA:  Left-sided weakness with dysarthria. Patient with calcified embolus in the inferior division right MCA. Discovered to have high-grade stenosis of the extracranial right ICA proximally EXAM: IR ANGIO INTRA EXTRACRAN SEL COM CAROTID INNOMINATE UNI RIGHT MOD SED COMPARISON:  CT angiogram of the head and neck of Dec 30, 2021. MEDICATIONS: Heparin 3,000 units IV. Ancef 2 g IV antibiotic was administered within 1 hour of the procedure. ANESTHESIA/SEDATION: General anesthesia. CONTRAST:  Omnipaque 300 approximately 120 cc. FLUOROSCOPY TIME:  Fluoroscopy Time: 76 minutes 36) seconds (1257 mGy). COMPLICATIONS: None immediate. TECHNIQUE: Informed written consent was obtained from the patient after a thorough discussion of the procedural risks, benefits and alternatives.  All questions were addressed. Maximal Sterile Barrier Technique was utilized including caps, mask, sterile gowns, sterile gloves, sterile drape, hand hygiene and skin antiseptic. A timeout was performed prior to the initiation of the procedure. The left groin was prepped and draped in the usual sterile fashion. Thereafter using modified Seldinger technique, transfemoral access into the left common femoral artery was obtained without difficulty. Over a 0.035 inch guidewire, an 8 French 25 cm Pinnacle sheath was inserted. Through this, and also over 0.035 inch guidewire, a 120 cm 6 French Simmons 2 catheter inside of an 087 95 cm balloon guide catheter combination was advanced to the aortic arch region and selectively positioned in the proximal right common carotid artery and the innominate artery. An arteriogram was then performed centered extra cranially and intracranially. FINDINGS: The innominate arteriogram demonstrates significant tortuosity of the innominate artery in its entirety extending into the right common carotid artery with a 180 degree loop in the proximal right common carotid artery. Subclavian artery demonstrates patency with opacification of a hypoplastic right vertebral artery extra cranially. The right common carotid arteriogram demonstrates complex atherosclerotic calcified plaque at the right common carotid bifurcation extending into the bulb region of the right internal carotid artery resulting in a high-grade approximately 80-90% stenosis. More distally, the right internal carotid artery opacifies to the cranial skull base. Patency is seen of the petrous, cavernous and supraclinoid right ICA with a prominent right posterior communicating artery. Proximal portions of the right middle cerebral artery and the right anterior cerebral artery demonstrate patency. ENDOVASCULAR REVASCULARIZATION OF HIGH-GRADE STENOSIS OF THE RIGHT INTERNAL CAROTID ARTERY PROXIMALLY WITH BALLOON ANGIOPLASTY The balloon  guide catheter was advanced into the proximal right common carotid artery. Through this, and also over a 0.014 inch Transend EX soft tip micro guidewire, an 021 150 cm microcatheter was then advanced with biplane DSA roadmap to the proximal cavernous right ICA followed by the microcatheter. The micro guidewire was removed. Good aspiration obtained from the hub of the microcatheter. A gentle control arteriogram performed through the microcatheter demonstrated safe positioning of the tip of the microcatheter. This in turn was then exchanged out for a 300 cm 014 inch BMW exchange wire with a moderate J configuration. The tip of the micro guidewire was positioned at the petrous cavernous junction. Measurements were then performed of the right internal carotid artery distal to the stenosis, and just proximal to the stenosis of the distal common carotid artery. A 5 mm x 20 mm 014 inch Viatrac balloon guide catheter was then  prepped and purged retrogradely with heparinized saline infusion, and retrogradely with 50% contrast and 50% heparinized saline infusion. Using the rapid exchange technique, the balloon catheter was then advanced without difficulty and positioned with the distal and proximal markers adequate distant from the site of the severe stenosis in the proximal right internal carotid artery. Slow control inflation was then performed using micro inflation syringe device via micro tubing. Inflations were performed slowly to 9 atmospheres where it was maintained for approximately 60 seconds. Thereafter, the balloon was deflated and retrieved and removed. A control arteriogram performed through the balloon guide catheter in the proximal right common carotid artery demonstrated significantly improved caliber and flow through the angioplastied segment. More distally, free flow was noted into the distal right ICA intracranially. Over the exchange BMW micro guidewire, a 8 mm x 26 mm Wallstent stent delivery system which  had been prepped with heparinized saline infusion was then advanced again using the rapid exchange technique to the proximal right common carotid artery. Resistance was encountered to further advancement of the stent delivery apparatus. At this time the balloon guide catheter was advanced to the distal right common carotid artery. Further attempts were made to advance the stent delivery apparatus into the right internal carotid proximally. Advancement could only manage just distal to the angioplastied segment with no significant coverage of the angioplastied segment. Further attempts at advancing were met with resistance secondary to a step-off related to the calcified plaque. After multiple attempts it was decided to stop the procedure. Final control arteriogram performed through the balloon guide catheter in the right common carotid artery continued to demonstrate excellent flow through the right external carotid artery with a mild-to-moderate stenosis at its origin. The right internal carotid artery proximally demonstrated approximately 60% patency which was maintained on subsequent 20 minute arteriogram. Centered intracranially demonstrated brisk flow into the right anterior cerebral artery and the right middle cerebral distribution. The previously occluded anterior branch of the inferior division proximally due to a calcified plaque remained stable. The delayed arterial phase demonstrated retrograde opacification of the posterior 2/3 of the cortical branches in the sylvian triangle from the branches of the inferior division and the superior division, and also from the pericallosal branches. The balloon guide catheter was removed. The 8 French Pinnacle sheath in the left common femoral artery was removed with manual compression held for approximately 25 minutes with a quick clot to achieved hemostasis at this site. Distal pulses remained palpable in the dorsalis pedis arteries bilaterally unchanged. An immediate  CT of the brain demonstrated no evidence of hemorrhagic complications. The patient was then gradually extubated without difficulty. Patient was able to maintain his airway, and oxygen concentrations. He was able to move his right side spontaneously and to command. The patient was able to follow simple commands appropriately. No significant motor movement was evident in the left upper extremity, with only a flicker in the left foot toes. Patient was then transferred to the PACU and then subsequently the neuro ICU for post revascularization care. Later in the afternoon, the patient appeared more awake, alert and appropriately responsive. Had mild intermittent right gaze deviation but was able to track his eyes to the left past the midline. Otherwise, neurologically he remained stable unchanged. 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 PLAN: Follow-up in the clinic 4 to 6 weeks post discharge. Electronically Signed  By: Luanne Bras M.D.   On: 01/03/2022 08:12    DG Swallowing Func-Speech Pathology   Result Date: 01/02/2022 Table formatting from the original result was not included. Objective Swallowing Evaluation: Type of Study: MBS-Modified Barium Swallow Study  Patient Details Name: Chad [name] MRN: [id number] Date of Birth: February 27, 1932 Today's Date: 01/02/2022 Time: SLP Start Time (ACUTE ONLY): 1100 -SLP Stop Time (ACUTE ONLY): 1123 SLP Time Calculation (min) (ACUTE ONLY): 23 min Past Medical History: Past Medical History: Diagnosis Date  Arthritis   Heart murmur   asa child   Hypertension   Stroke [facility])  Past Surgical History: Past Surgical History: Procedure Laterality Date  EYE SURGERY Bilateral 2022  JOINT REPLACEMENT    RADIOLOGY WITH ANESTHESIA N/A 01/01/2022  Procedure: Angiogram;  Surgeon: Luanne Bras, MD;  Location: [facility];  Service: Radiology;  Laterality: N/A;  TONSILLECTOMY    TOTAL KNEE ARTHROPLASTY Right 03/30/2013  Procedure: RIGHT TOTAL KNEE ARTHROPLASTY;  Surgeon: Tobi Bastos, MD;  Location: [facility];  Service: Orthopedics;  Laterality: Right;  TOTAL KNEE ARTHROPLASTY Left 10/09/2020  Procedure: TOTAL KNEE ARTHROPLASTY;  Surgeon: Paralee Cancel, MD;  Location: [facility];  Service: Orthopedics;  Laterality: Left;  70 mins HPI: 88 yo male presenting to the ED on 5/28 with L sided weakness, facial droop, slurred speech, and fall. 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 PMH including hypothyroidism chronic pain syndromes, CKD, HTN, arthritis, bil TKA.  Subjective: alert, friendly  Recommendations for follow up therapy are one component of a multi-disciplinary discharge planning process, led by the attending physician.  Recommendations may be updated based on patient status, additional functional criteria and insurance authorization. Assessment / Plan / Recommendation   01/02/2022  11:00 AM Clinical Impressions Clinical Impression Pt presents with oropharyngeal dysphagia characterized by reduced labial seal, impaired mastication, a pharyngeal delay and reduction in bolus cohesion, posterior bolus propulsion, tongue base retraction, anterior laryngeal movement, and pharyngeal constriction. He demonstrated left-sided anterior spillage, lingual residue, difficulty with A-P transport of solids, premature spillage to the valleculae and pyriform sinuses, and posterior pharyngeal wall residue. A liquid bolus was necessary to facilitate mastication and A-P transport of a nutrigrain bar. Liquid washes reduced pharyngeal residue to a more functional level and a left head turn was effective in reducing pyriform sinus residue with thin liquids, but pt exhibited notable difficulty maintaining this position. Penetration (PAS 3, 5) and aspiration (PAS 7, 8) were noted with thin liquids.  Larger amounts of aspirate did trigger a cough which was mostly effective, but smaller quantities inconsistently triggered throat clearing which was inadequate for expulsion of material. No functional benefit was noted with prompted coughing. A dysphagia 1 diet with nectar thick liquids is recommended at this time. SLP will follow for dysphagia treatment. SLP Visit Diagnosis Dysphagia, oropharyngeal phase (R13.12) Impact on safety and function Mild aspiration risk     01/02/2022  11:00 AM Treatment Recommendations Treatment Recommendations Therapy as outlined in treatment plan below     01/02/2022  11:00 AM Prognosis Prognosis for Safe Diet Advancement Good   01/02/2022  11:00 AM Diet Recommendations SLP Diet Recommendations Dysphagia 1 (Puree) solids;Nectar thick liquid Liquid Administration via Cup;Straw Medication Administration Whole meds with puree Compensations Slow rate;Small sips/bites;Monitor for anterior loss;Follow solids with liquid Postural Changes Seated upright at 90 degrees     01/02/2022  11:00 AM Other Recommendations Other Recommendations Order thickener from pharmacy Follow Up Recommendations Acute inpatient rehab (3hours/day) Assistance  recommended at discharge Frequent or constant Supervision/Assistance Functional Status Assessment Patient has had a recent decline in their functional status and demonstrates the ability to make significant improvements in function in a reasonable and predictable amount of time.   01/02/2022  11:00 AM Frequency and Duration  Speech Therapy Frequency (ACUTE ONLY) min 2x/week Treatment Duration 2 weeks     01/02/2022  11:00 AM Oral Phase Oral Phase Impaired Oral - Nectar Cup Left anterior bolus loss;Reduced posterior propulsion;Lingual/palatal residue;Delayed oral transit;Decreased bolus cohesion;Premature spillage Oral - Nectar Straw Left anterior bolus loss;Reduced posterior propulsion;Lingual/palatal residue;Delayed oral transit;Decreased bolus cohesion;Premature spillage  Oral - Thin Cup Left anterior bolus loss;Reduced posterior propulsion;Lingual/palatal residue;Delayed oral transit;Decreased bolus cohesion;Premature spillage Oral - Thin Straw Left anterior bolus loss;Reduced posterior propulsion;Lingual/palatal residue;Delayed oral transit;Decreased bolus cohesion;Premature spillage Oral - Puree Left anterior bolus loss;Reduced posterior propulsion;Lingual/palatal residue;Delayed oral transit;Decreased bolus cohesion;Weak lingual manipulation Oral - Mech Soft Left anterior bolus loss;Reduced posterior propulsion;Lingual/palatal residue;Delayed oral transit;Decreased bolus cohesion;Weak lingual manipulation;Impaired mastication Oral - Pill Left anterior bolus loss;Reduced posterior propulsion;Lingual/palatal residue;Delayed oral transit;Decreased bolus cohesion    01/02/2022  11:00 AM Pharyngeal Phase Pharyngeal Phase Impaired Pharyngeal- Thin Cup Reduced tongue base retraction;Reduced anterior laryngeal mobility;Delayed swallow initiation-pyriform sinuses;Reduced pharyngeal peristalsis;Penetration/Aspiration during swallow;Penetration/Apiration after swallow;Pharyngeal residue - valleculae;Pharyngeal residue - pyriform;Pharyngeal residue - posterior pharnyx Pharyngeal Material enters airway, remains ABOVE vocal cords and not ejected out;Material enters airway, CONTACTS cords and not ejected out;Material enters airway, passes BELOW cords and not ejected out despite cough attempt by patient;Material enters airway, passes BELOW cords without attempt by patient to eject out (silent aspiration) Pharyngeal- Thin Straw Reduced tongue base retraction;Reduced anterior laryngeal mobility;Delayed swallow initiation-pyriform sinuses;Reduced pharyngeal peristalsis;Penetration/Aspiration during swallow;Penetration/Apiration after swallow;Pharyngeal residue - valleculae;Pharyngeal residue - pyriform;Pharyngeal residue - posterior pharnyx Pharyngeal Material enters airway, passes BELOW cords and  not ejected out despite cough attempt by patient;Material enters airway, passes BELOW cords without attempt by patient to eject out (silent aspiration);Material enters airway, CONTACTS cords and then ejected out Pharyngeal- Puree Reduced tongue base retraction;Reduced anterior laryngeal mobility;Delayed swallow initiation-pyriform sinuses;Reduced pharyngeal peristalsis;Pharyngeal residue - valleculae;Pharyngeal residue - pyriform;Pharyngeal residue - posterior pharnyx Pharyngeal- Mechanical Soft Reduced tongue base retraction;Reduced anterior laryngeal mobility;Delayed swallow initiation-pyriform sinuses;Reduced pharyngeal peristalsis;Pharyngeal residue - valleculae;Pharyngeal residue - pyriform;Pharyngeal residue - posterior pharnyx Pharyngeal- Pill Reduced tongue base retraction;Reduced anterior laryngeal mobility;Delayed swallow initiation-pyriform sinuses;Reduced pharyngeal peristalsis;Pharyngeal residue - valleculae;Pharyngeal residue - pyriform;Pharyngeal residue - posterior pharnyx    01/02/2022  11:00 AM Cervical Esophageal Phase  Cervical Esophageal Phase [name], [place], [place] Office number [phone] Pager [phone] Horton Marshall 01/02/2022, 12:20 PM                      VAS US CAROTID   Result Date: 12/31/2021 Carotid Arterial Duplex Study Patient Name:  Chad [name]  Date of Exam:   12/30/2021 Medical Rec #: [id number]          Accession #:    [id number] Date of Birth: May 13, 1932           Patient Gender: M Patient Age:   79 years Exam Location:  [facility] Procedure:      VAS US CAROTID Referring Phys: Nyoka Lint DOUTOVA --------------------------------------------------------------------------------  Indications:       CVA. Risk Factors:      Hypertension. Comparison Study:  No prior study Performing Technologist: Maudry Mayhew MHA, RDMS, RVT, RDCS  Examination Guidelines: A complete evaluation includes B-mode imaging, spectral  Doppler,  color Doppler, and power Doppler as needed of all accessible portions of each vessel. Bilateral testing is considered an integral part of a complete examination. Limited examinations for reoccurring indications may be performed as noted.  Right Carotid Findings: +----------+-------+--------+--------+-------------------------------+---------+           PSV    EDV cm/sStenosisPlaque Description             Comments            cm/s                                                            +----------+-------+--------+--------+-------------------------------+---------+ CCA Prox  54     7               heterogenous and irregular               +----------+-------+--------+--------+-------------------------------+---------+ CCA Distal58     9               smooth and heterogenous                  +----------+-------+--------+--------+-------------------------------+---------+ ICA Prox  132    19              smooth, heterogenous and       Shadowing                                  calcific                                 +----------+-------+--------+--------+-------------------------------+---------+ ICA Distal78     15                                                       +----------+-------+--------+--------+-------------------------------+---------+ ECA       126                                                             +----------+-------+--------+--------+-------------------------------+---------+ +----------+--------+-------+----------------+-------------------+           PSV cm/sEDV cmsDescribe        Arm Pressure (mmHG) +----------+--------+-------+----------------+-------------------+ [id number]            Multiphasic, WNL                    +----------+--------+-------+----------------+-------------------+ +---------+--------+--+--------+-+---------+ VertebralPSV cm/s51EDV cm/s5Antegrade  +---------+--------+--+--------+-+---------+  Left Carotid Findings: +----------+--------+--------+--------+------------------------------+---------+           PSV cm/sEDV cm/sStenosisPlaque Description            Comments  +----------+--------+--------+--------+------------------------------+---------+ CCA Prox  89                      heterogenous and irregular    tortuous  +----------+--------+--------+--------+------------------------------+---------+ CCA Distal70      8                                                       +----------+--------+--------+--------+------------------------------+---------+  ICA Prox  49      12              heterogenous, irregular and   Shadowing                                   calcific                                +----------+--------+--------+--------+------------------------------+---------+ ICA Distal98      18                                                      +----------+--------+--------+--------+------------------------------+---------+ ECA       67      10                                                      +----------+--------+--------+--------+------------------------------+---------+ +----------+--------+--------+----------------+-------------------+           PSV cm/sEDV cm/sDescribe        Arm Pressure (mmHG) +----------+--------+--------+----------------+-------------------+ [id number]             Multiphasic, WNL                    +----------+--------+--------+----------------+-------------------+ +---------+--------+--+--------+--+---------+ VertebralPSV cm/s94EDV cm/s14Antegrade +---------+--------+--+--------+--+---------+   Summary: Right Carotid: Velocities in the right ICA are consistent with a 1-39% stenosis. Left Carotid: Velocities in the left ICA are consistent with a 1-39% stenosis. Vertebrals:  Bilateral vertebral arteries demonstrate antegrade flow. Subclavians: Normal flow  hemodynamics were seen in bilateral subclavian              arteries. *See table(s) above for measurements and observations.  Electronically signed by Antony Contras MD on 12/31/2021 at 3:59:42 PM.    Final     ECHOCARDIOGRAM COMPLETE   Result Date: 12/30/2021    ECHOCARDIOGRAM REPORT   Patient Name:   Chad [name] Date of Exam: 12/30/2021 Medical Rec #:  [id number]         Height:       66.0 in Accession #:    [id number]        Weight:       133.6 lb Date of Birth:  1931-12-20          BSA:          1.685 m Patient Age:    39 years          BP:           161/84 mmHg Patient Gender: M                 HR:           66 bpm. Exam Location:  Inpatient Procedure: 2D Echo Indications:    stroke  History:        Patient has prior history of Echocardiogram examinations, most                 recent 04/25/2020. Chronic kidney disease.; Risk  Factors:Hypertension and Dyslipidemia.  Sonographer:    Johny Chess RDCS Referring Phys: [place]  1. Left ventricular ejection fraction, by estimation, is 55 to 60%. The left ventricle has normal function. The left ventricle has no regional wall motion abnormalities. There is mild left ventricular hypertrophy. Left ventricular diastolic parameters are consistent with Grade I diastolic dysfunction (impaired relaxation).  2. Right ventricular systolic function is normal. The right ventricular size is normal.  3. Left atrial size was mildly dilated.  4. The mitral valve is normal in structure. Mild mitral valve regurgitation. No evidence of mitral stenosis.  5. The aortic valve is tricuspid. Aortic valve regurgitation is mild. Aortic valve sclerosis/calcification is present, without any evidence of aortic stenosis.  6. The inferior vena cava is normal in size with greater than 50% respiratory variability, suggesting right atrial pressure of 3 mmHg. FINDINGS  Left Ventricle: Left ventricular ejection fraction, by estimation, is 55 to 60%. The  left ventricle has normal function. The left ventricle has no regional wall motion abnormalities. The left ventricular internal cavity size was normal in size. There is  mild left ventricular hypertrophy. Left ventricular diastolic parameters are consistent with Grade I diastolic dysfunction (impaired relaxation). Right Ventricle: The right ventricular size is normal. Right ventricular systolic function is normal. Left Atrium: Left atrial size was mildly dilated. Right Atrium: Right atrial size was normal in size. Pericardium: Trivial pericardial effusion is present. Mitral Valve: The mitral valve is normal in structure. Mild mitral annular calcification. Mild mitral valve regurgitation. No evidence of mitral valve stenosis. Tricuspid Valve: The tricuspid valve is normal in structure. Tricuspid valve regurgitation is trivial. No evidence of tricuspid stenosis. Aortic Valve: The aortic valve is tricuspid. Aortic valve regurgitation is mild. Aortic regurgitation PHT measures 400 msec. Aortic valve sclerosis/calcification is present, without any evidence of aortic stenosis. Pulmonic Valve: The pulmonic valve was normal in structure. Pulmonic valve regurgitation is trivial. No evidence of pulmonic stenosis. Aorta: The aortic root is normal in size and structure. Venous: The inferior vena cava is normal in size with greater than 50% respiratory variability, suggesting right atrial pressure of 3 mmHg. IAS/Shunts: No atrial level shunt detected by color flow Doppler.  LEFT VENTRICLE PLAX 2D LVIDd:         5.20 cm   Diastology LVIDs:         3.10 cm   LV e' medial:    4.50 cm/s LV PW:         1.20 cm   LV E/e' medial:  12.0 LV IVS:        1.10 cm   LV e' lateral:   4.20 cm/s LVOT diam:     2.10 cm   LV E/e' lateral: 12.9 LV SV:         80 LV SV Index:   47 LVOT Area:     3.46 cm  RIGHT VENTRICLE             IVC RV S prime:     15.70 cm/s  IVC diam: 1.00 cm TAPSE (M-mode): 2.0 cm LEFT ATRIUM             Index        RIGHT  ATRIUM           Index LA diam:        3.40 cm 2.02 cm/m   RA Area:     16.30 cm LA Vol (A2C):   79.2 ml 47.01 ml/m  RA  Volume:   39.80 ml  23.62 ml/m LA Vol (A4C):   47.5 ml 28.20 ml/m LA Biplane Vol: 63.0 ml 37.40 ml/m  AORTIC VALVE LVOT Vmax:   102.00 cm/s LVOT Vmean:  67.600 cm/s LVOT VTI:    0.230 m AI PHT:      400 msec  AORTA Ao Root diam: 3.70 cm Ao Asc diam:  3.10 cm MITRAL VALVE MV Area (PHT): 4.60 cm    SHUNTS MV Decel Time: 165 msec    Systemic VTI:  0.23 m MR Peak grad: 134.6 mmHg   Systemic Diam: 2.10 cm MR Mean grad: 82.0 mmHg MR Vmax:      580.00 cm/s MR Vmean:     416.0 cm/s MV E velocity: 54.00 cm/s MV A velocity: 96.40 cm/s MV E/A ratio:  0.56 Kirk Ruths MD Electronically signed by Kirk Ruths MD Signature Date/Time: 12/30/2021/2:40:23 PM    Final     CT ANGIO HEAD NECK W WO CM   Result Date: 12/30/2021 CLINICAL DATA:  Stroke/TIA, determine embolic source EXAM: CT ANGIOGRAPHY HEAD AND NECK TECHNIQUE: Multidetector CT imaging of the head and neck was performed using the standard protocol during bolus administration of intravenous contrast. Multiplanar CT image reconstructions and MIPs were obtained to evaluate the vascular anatomy. Carotid stenosis measurements (when applicable) are obtained utilizing NASCET criteria, using the distal internal carotid diameter as the denominator. RADIATION DOSE REDUCTION: This exam was performed according to the departmental dose-optimization program which includes automated exposure control, adjustment of the mA and/or kV according to patient size and/or use of iterative reconstruction technique. CONTRAST:  73m OMNIPAQUE IOHEXOL 350 MG/ML SOLN COMPARISON:  Same day MRI/MRA. FINDINGS: CT HEAD FINDINGS Brain: Small right MCA territory infarcts better characterized on same day MRI. No evidence of acute hemorrhage, mass effect, midline shift, extra-axial fluid collection, or hydrocephalus. Patchy white matter hypodensities, nonspecific but compatible  with chronic microvascular ischemic disease. Vascular: Detailed below. Skull: No acute fracture. Sinuses: Mild paranasal sinus mucosal thickening. Orbits: No acute orbital findings. Review of the MIP images confirms the above findings CTA NECK FINDINGS Aortic arch: Calcific atherosclerosis of the aorta and great vessel origins. Ectatic right subclavian artery. Right carotid system: Atherosclerosis at the carotid bifurcation with approximately 80% stenosis. Left carotid system: Atherosclerosis at the carotid bifurcation without greater than 50% stenosis. Vertebral arteries: Potentially severe stenosis of the right vertebral artery origin due to calcific atherosclerosis. Otherwise, vertebral arteries are patent without significant (greater than 50%) stenosis. Left dominant. Skeleton: Severe multilevel degenerative change. Other neck: No acute findings. Upper chest: Clear sinuses. Review of the MIP images confirms the above findings CTA HEAD FINDINGS Anterior circulation: Bilateral intracranial ICAs are patent with mild narrowing due to calcific atherosclerosis. Bilateral M1 MCAs and left M2 MCAs are patent. Apparent occlusion of a right M2 MCA branch in a region of calcification. Bilateral ACAs are patent. Posterior circulation: Left dominant intradural vertebral artery. Right intradural vertebral artery makes very small contribution to the basilar artery. Basilar artery and bilateral posterior cerebral arteries are patent without proximal hemodynamically significant stenosis. Venous sinuses: As permitted by contrast timing, patent. Anatomic variants: Detailed above. Review of the MIP images confirms the above findings IMPRESSION: CTA: 1. 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 2. Extensive calcific atherosclerosis at the right carotid bifurcation with  approximately 80% stenosis of the proximal ICA. 3. Potentially severe stenosis of  the non dominant right vertebral artery origin. CT head: Small right MCA territory infarcts better characterized on same day MRI. No evidence of progressive mass effect or acute hemorrhage. Findings discussed with Dr. Erlinda Hong via telephone at 12:50 p.m. Electronically Signed   By: Margaretha Sheffield M.D.   On: 12/30/2021 13:00    MR BRAIN WO CONTRAST   Result Date: 12/30/2021 CLINICAL DATA:  There are deficit with acute stroke suspected EXAM: MRI HEAD WITHOUT CONTRAST MRA HEAD WITHOUT CONTRAST TECHNIQUE: Multiplanar, multi-echo pulse sequences of the brain and surrounding structures were acquired without intravenous contrast. Angiographic images of the Circle of Willis were acquired using MRA technique without intravenous contrast. COMPARISON:  Head CT from yesterday FINDINGS: MRI HEAD FINDINGS Brain: Cluster of small acute infarcts in the right insular cortex, right frontal cortex, and deep right cerebral white matter. These are in a MCA distribution. Background of advanced chronic small vessel ischemia with confluent gliosis in the hemispheric white matter. Chronic lacunar infarcts in the deep cerebrum. Small remote left cerebral infarct. Gradient signal at the lower right sylvian fissure with there is calcification along the MCA branches by CT. Vascular: Major flow voids are preserved Skull and upper cervical spine: Normal marrow signal Sinuses/Orbits: Bilateral cataract resection MRA HEAD FINDINGS Anterior circulation: High-grade narrowing involving the upper right M2 branch with there is a gradient hypointensity noted above. Elsewhere vessels are smoothly contoured and widely patent. Posterior circulation: Vertebrobasilar arteries are smoothly contoured and widely patent. IMPRESSION: Brain MRI: 1. Cluster of small cortical and white matter infarcts within the right MCA distribution. 2. Background of advanced chronic small vessel  ischemia. Intracranial MRA: Focal high-grade narrowing at the right M2 level where there is calcification by CT. This could reflect calcified plaque or a calcified embolus. If an embolus, the proximal nature and degree of mild infarct with suggest additional territory of risk. Electronically Signed   By: Jorje Guild M.D.   On: 12/30/2021 07:53    MR ANGIO HEAD WO CONTRAST   Result Date: 12/30/2021 CLINICAL DATA:  There are deficit with acute stroke suspected EXAM: MRI HEAD WITHOUT CONTRAST MRA HEAD WITHOUT CONTRAST TECHNIQUE: Multiplanar, multi-echo pulse sequences of the brain and surrounding structures were acquired without intravenous contrast. Angiographic images of the Circle of Willis were acquired using MRA technique without intravenous contrast. COMPARISON:  Head CT from yesterday FINDINGS: MRI HEAD FINDINGS Brain: Cluster of small acute infarcts in the right insular cortex, right frontal cortex, and deep right cerebral white matter. These are in a MCA distribution. Background of advanced chronic small vessel ischemia with confluent gliosis in the hemispheric white matter. Chronic lacunar infarcts in the deep cerebrum. Small remote left cerebral infarct. Gradient signal at the lower right sylvian fissure with there is calcification along the MCA branches by CT. Vascular: Major flow voids are preserved Skull and upper cervical spine: Normal marrow signal Sinuses/Orbits: Bilateral cataract resection MRA HEAD FINDINGS Anterior circulation: High-grade narrowing involving the upper right M2 branch with there is a gradient hypointensity noted above. Elsewhere vessels are smoothly contoured and widely patent. Posterior circulation: Vertebrobasilar arteries are smoothly contoured and widely patent. IMPRESSION: Brain MRI: 1. Cluster of small cortical and white matter infarcts within the right MCA distribution. 2. Background of advanced chronic small vessel ischemia. Intracranial MRA: Focal high-grade  narrowing at the right M2 level where there is calcification by CT. This could reflect calcified plaque or a calcified embolus. If an embolus, the proximal nature and degree of mild infarct with suggest  additional territory of risk. Electronically Signed   By: Jorje Guild M.D.   On: 12/30/2021 07:53    DG Chest Portable 1 View   Result Date: 12/29/2021 CLINICAL DATA:  CVA. Fell last night. Family concerned patient injured left side EXAM: PORTABLE CHEST 1 VIEW COMPARISON:  Chest two views 03/23/2013 FINDINGS: Cardiac silhouette is mildly enlarged. Moderate calcifications within the aortic arch and descending thoracic aorta. Minimal bibasilar horizontal linear chronic scarring is unchanged. No focal airspace opacity to indicate pneumonia. No pleural effusion or pneumothorax. Moderate multilevel degenerative disc changes of the thoracic spine. IMPRESSION: Mild stable cardiomegaly. No acute lung process. Electronically Signed   By: Yvonne Kendall M.D.   On: 12/29/2021 19:19    DG Hand Complete Left   Result Date: 12/29/2021 CLINICAL DATA:  Fall.  Bruising to posterior left hand. EXAM: LEFT HAND - COMPLETE 3+ VIEW COMPARISON:  None Available. FINDINGS: There is diffuse decreased bone mineralization. A pulse oximeter overlies the distal index finger obscuring portions. Degenerative changes including joint space narrowing, subchondral sclerosis and peripheral osteophytosis are moderate at the triscaphe joint; moderate to severe at the thumb carpometacarpal, thumb metacarpophalangeal, and thumb interphalangeal joints; and moderate to severe at the DIP joints and moderate at the PIP joints of the second through fifth fingers. No acute fracture is seen. No dislocation. IMPRESSION: Osteoarthritis as above. No acute fracture. Electronically Signed   By: Yvonne Kendall M.D.   On: 12/29/2021 18:12    DG Elbow Complete Left   Result Date: 12/29/2021 CLINICAL DATA:  Fall.  Left elbow pain and bruising. EXAM: LEFT  ELBOW - COMPLETE 3+ VIEW COMPARISON:  None Available. FINDINGS: No fracture or dislocation. Narrowed radiocapitellar joint. Marginal osteophytes project from the base of the radial head. No joint effusion. There is soft tissue edema posteriorly. IMPRESSION: No fracture or dislocation. Electronically Signed   By: Lajean Manes M.D.   On: 12/29/2021 18:12    CT Cervical Spine Wo Contrast   Result Date: 12/29/2021 CLINICAL DATA:  There logic deaf sick, neck trauma EXAM: CT CERVICAL SPINE WITHOUT CONTRAST TECHNIQUE: Multidetector CT imaging of the cervical spine was performed without intravenous contrast. Multiplanar CT image reconstructions were also generated. RADIATION DOSE REDUCTION: This exam was performed according to the departmental dose-optimization program which includes automated exposure control, adjustment of the mA and/or kV according to patient size and/or use of iterative reconstruction technique. COMPARISON:  None Available. FINDINGS: Alignment: Mild degenerative anterolisthesis of C2 and C3. Otherwise alignment is anatomic. Skull base and vertebrae: No acute fracture. No primary bone lesion or focal pathologic process. Soft tissues and spinal canal: No prevertebral fluid or swelling. No visible canal hematoma. Disc levels: Partial bony fusion across the disc spaces at C3-4 and C4-5. There is severe multilevel spondylosis throughout the remainder of the cervical spine, greatest at C5-6 and C6-7. Diffuse facet hypertrophy. Upper chest: Airway is patent. Lung apices are clear. Prominent atherosclerosis of the aortic arch. Other: Reconstructed images demonstrate no additional findings. IMPRESSION: 1. Extensive multilevel cervical degenerative changes. No acute fracture. Electronically Signed   By: Randa Ngo M.D.   On: 12/29/2021 17:38    CT HEAD WO CONTRAST   Result Date: 12/29/2021 CLINICAL DATA:  Neurologic deficit EXAM: CT HEAD WITHOUT CONTRAST TECHNIQUE: Contiguous axial images were  obtained from the base of the skull through the vertex without intravenous contrast. RADIATION DOSE REDUCTION: This exam was performed according to the departmental dose-optimization program which includes automated exposure control, adjustment of the mA  and/or kV according to patient size and/or use of iterative reconstruction technique. COMPARISON:  None Available. FINDINGS: Brain: Confluent hypodensities throughout the periventricular white matter are most consistent with age-indeterminate small vessel ischemic changes, likely chronic. Focal hypodensities in the left basal ganglia and right frontal periventricular white matter consistent with chronic lacunar infarcts. Age-indeterminate lacunar infarct is seen within the right basal ganglia image 18/3. No other signs of acute infarct or hemorrhage. Lateral ventricles and midline structures are otherwise unremarkable. No acute extra-axial fluid collections. No mass effect. Vascular: No hyperdense vessel or unexpected calcification. Skull: Normal. Negative for fracture or focal lesion. Sinuses/Orbits: No acute finding. Other: None. IMPRESSION: 1. Age indeterminate lacunar infarct within the right basal ganglia. 2. Age-indeterminate small-vessel ischemic changes throughout the periventricular white matter, favor chronic. 3. Chronic left basal ganglia and right frontal white matter lacunar infarcts as above. 4. No acute hemorrhage. Electronically Signed   By: Randa Ngo M.D.   On: 12/29/2021 17:35      Labs:  Basic Metabolic Panel: Last Labs          Recent Labs  Lab 01/09/22 0528 01/10/22 1831 01/11/22 0515 01/11/22 1650 01/12/22 0522  NA  --   --   --   --  146*  K  --   --   --   --  3.3*  CL  --   --   --   --  116*  CO2  --   --   --   --  22  GLUCOSE  --   --   --   --  134*  BUN  --   --   --   --  30*  CREATININE 1.46*  --   --   --  1.30*  CALCIUM  --   --   --   --  8.3*  MG  --  2.1 2.0 1.9 1.9  PHOS  --  3.3 2.9 2.2* 1.8*         CBC: Last Labs   No results for input(s): "WBC", "NEUTROABS", "HGB", "HCT", "MCV", "PLT" in the last 168 hours.     CBG: Last Labs          Recent Labs  Lab 01/14/22 1129 01/14/22 1604 01/14/22 2012 01/25/2022 0007 01/20/2022 0346  GLUCAP 133* 129* 151* 147* 137*      Family history.  Positive for hypertension as well as hyperlipidemia.  Denies any colon cancer esophageal cancer or rectal cancer   Brief HPI:   Chad [name] is a 88 y.o. right-handed male with history of CKD stage III hyperlipidemia mild aortic regurgitation bilateral total knee arthroplasty chronic pain using hydrocodone 7.5-325 mg 3 times daily as needed, BPH maintained on Flomax.  Per chart review lives alone.  Presented 12/29/2021 with acute onset of left-sided weakness resulting in a fall.  Denied loss of consciousness.  Cranial CT scan showed age-indeterminate lacunar infarction within the right basal ganglia.  Age-indeterminate small vessel ischemic changes throughout the periventricular white matter, favor chronic.  No acute hemorrhage.  CT cervical spine negative.  MRI/MRA showed cluster of small cortical and white matter infarcts within the right MCA distribution.  MRI focal high-grade narrowing at the right M2 level where there was calcification by CT.  CTA of the neck apparent occlusion of proximal right M2 MCA branch in the region of calcification possibly calcified embolus or calcified plaque.  Extensive calcific atherosclerosis of the right carotid bifurcation with approximately 80% stenosis of  the proximal ICA.  Potentially severe stenosis of the nondominant right vertebral artery origin.  Patient did not receive tPA.  Echocardiogram with ejection fraction of 55 to 60% no wall motion abnormalities.  Admission chemistries unremarkable except BUN 35 creatinine 1.46 CK within normal limits troponin 76.  Hospital course interventional radiology consulted for extensive calcified atherosclerosis of the right ICA  bifurcation with 80% stenosis status post right carotid angioplasty 5/31.  Placed on aspirin as well as Brilinta therapy for CVA prophylaxis.  Plan follow-up outpatient interventional radiology 1 to 2 months if restenosis to consider right CEA.  Maintained on Lovenox for DVT prophylaxis.  Dysphagia #1 pudding thick liquid diet advance to nectar liquids 01/02/2022.  Therapy evaluations completed and patient was admitted for a comprehensive rehab program.     Hospital Course: Chad ROCHFORD was admitted to rehab 01/26/2022 for inpatient therapies to consist of PT, ST and OT at least three hours five days a week. Past admission physiatrist, therapy team and rehab RN have worked together to provide customized collaborative inpatient rehab.  Pertaining to patient's right MCA scattered infarct due to right M2 occlusions secondary to large vessel disease high-grade right ICA stenosis status post carotid angioplasty 5/31.  Aspirin 81 mg daily and Brilinta 90 mg twice daily for CVA prophylaxis.  Patient had been cleared for Lovenox for DVT prophylaxis.  Dysphagia diet has been downgraded n.p.o. tube feeds follow-up speech therapy.  Blood pressure monitored and remained soft.  Crestor ongoing for hyperlipidemia.  CKD 3 latest creatinine 1.30.  On the morning of 01/06/2022 notified rapid response for respiratory distress with saturations in the 70s on room air.  Patient was arousable to noxious stimuli.  Placed on 2 L nasal cannula saturations 96%.  Patient remained tachypneic with respirations 35-40.  Chest x-ray showed perihilar airspace opacities likely edema although pneumonia not excluded.  Patient was placed on intravenous antibiotic therapy.  Patient was discharged to acute care service for ongoing monitoring and work-up.     Blood pressures were monitored on TID basis and remained soft and monitored   Diabetes has been monitored with ac/hs CBG checks and SSI was use prn for tighter BS control.       Rehab  course: During patient's stay in rehab weekly team conferences were held to monitor patient's progress, set goals and discuss barriers to discharge. At admission, patient required moderate assist supine to sit mod assist sit to supine   He/She  has had improvement in activity tolerance, balance, postural control as well as ability to compensate for deficits. He/She has had improvement in functional use RUE/LUE  and RLE/LLE as well as improvement in awareness.  Required total assist for all mobility with sessions being variable.  Stand pivot transfer to TIS wheelchair with total assist.  Total assist of 3 to sliding board transfer.  Speech therapy continue to follow for dysphagia.           Disposition: Discharged to acute care services.       Diet: Currently n.p.o.   Special Instructions: As dictated per hospitalist team   30-35 minutes were spent completing discharge summary and discharge planning           Signed: Cathlyn Parsons 01/12/2022, 7:36 AM          Cosigned by: Charlett Blake, MD at 01/23/2022 10:58 AM

## 2022-01-17 NOTE — Progress Notes (Signed)
NAME:  BRETTON [name], MRN:  [id number], DOB:  17-Jan-1932, LOS: 2 ADMISSION DATE:  01/09/2022, CONSULTATION DATE: 01/19/2022 REFERRING MD:  Nena Alexander MD, CHIEF COMPLAINT: Acute respiratory failure, aspiration.    History of Present Illness:  88 year old with hypertension with recent admission for stroke, left hemiplegia status post balloon angioplasty of ICA.  He was transferred to rehab on 01/02/22.  Readmitted on 6/14 with respiratory distress, chest x-ray with bilateral infiltrates possible aspiration of tube feeds.  He was made n.p.o. and transferred to medical floor and placed on BiPAP.  PCCM consulted for help with management.  Pertinent  Medical History    has a past medical history of Arthritis, Heart murmur, Hypertension, and Stroke ([place]).   Significant Hospital Events: Including procedures, antibiotic start and stop dates in addition to other pertinent events   6/14 Admit from CIR, intubated upon arrival to ICU  Interim History / Subjective:  Off pressors this morning Setting up for EGD now Minimal vent settings  Objective   Blood pressure (!) 151/71, pulse 78, temperature 97.8 F (36.6 C), temperature source Oral, resp. rate 15, SpO2 96 %.    Vent Mode: PRVC FiO2 (%):  [30 %] 30 % Set Rate:  [16 bmp] 16 bmp Vt Set:  [420 mL] 420 mL PEEP:  [5 cmH20] 5 cmH20 Plateau Pressure:  [19 cmH20-23 cmH20] 22 cmH20   Intake/Output Summary (Last 24 hours) at 01/12/2022 0839 Last data filed at 01/10/2022 0600 Gross per 24 hour  Intake 1706.84 ml  Output 875 ml  Net 831.84 ml    There were no vitals filed for this visit.  Examination:  Gen:      Frail elderly male on vent HEENT:  [place]/AT, PERRL, no JVD Lungs:    Clear bilateral breath sounds CV:         RRR, no MRG Abd:      Soft, NT, nD Ext:    No acute deformity Skin:      Grossly intact Neuro:  Sedated  Labs/imaging reviewed  K 5.4, Creat 3.01, BUN 57, phos 5.3, Hgb 9.5  Resolved Hospital Problem list      Assessment & Plan:   Acute hypoxic respiratory failure, aspiration pneumonia - Full vent support - SBT this morning - Fentanyl infusion for RASS goal -1 to -2.  - VAP bundle - PAD protocol  AKI on CKD stage IIIa secondary to ATN,  Hyperkalemia - Has received multiple temporizing doses for hyper K plus lokelma x 2. Slowly improving - Foley still needed - Follow chemistry - Not a good candidate for HD  GI Bleed: 6/14 s/p 2 unit PRBC. No additional bleeding. EGD 6/16 with no clear etiology.  - PPI BID  Acute CVA - Left hemiplegia New acute/subacute R MCA infarct from prior MRI.  Sz 6/15 - Neurology following - PRN Ativan - Can restart asa, brilinta, and SQH with negative endoscopy.  - Neuro checks  Chronic HFpEF - HTN - Hold Lasix for now.   DM - SSI coverage  Goals of care - Family updates have been ongoing  Best Practice (right click and "Reselect all SmartList Selections" daily)   Diet/type: NPO DVT prophylaxis: prophylactic heparin  GI prophylaxis: PPI Lines: N/A Foley:  Yes, and it is still needed Code Status:  full code Last date of multidisciplinary goals of care discussion []   Critical care time:    The patient is critically ill with multiple organ system failure and requires high complexity decision making for  assessment and support, frequent evaluation and titration of therapies, advanced monitoring, review of radiographic studies and interpretation of complex data.   Critical Care Time devoted to patient care services, exclusive of separately billable procedures, described in this note is 55 minutes.   Georgann Housekeeper, AGACNP-BC [facility]  See Amion for personal pager PCCM on call pager [phone] until 7pm. Please call Elink 7p-7a. [phone]  01/16/2022 8:39 AM

## 2022-01-17 NOTE — TOC CAGE-AID Note (Signed)
Transition of Care [facility]) - CAGE-AID Screening   Patient Details  Name: Chad [name] MRN: [id number] Date of Birth: 1932-07-07  Transition of [facility] CM/SW Contact:    Lamaya Hyneman C Tarpley-Carter, [place] Phone Number: 01/14/2022, 2:03 PM   Clinical Narrative: Pt is unable to participate in Cage Aid. Pt is in critical condition.  [name], MSW, LCSW-A Pronouns:  She/Her/Hers [facility] HealthTransitions of Care Clinical Social Worker Direct Number:  [phone] [name]  CAGE-AID Screening: Substance Abuse Screening unable to be completed due to: : Patient unable to participate

## 2022-01-17 NOTE — TOC Progression Note (Signed)
Transition of Care [facility]) - Initial/Assessment Note    Patient Details  Name: Chad [name] MRN: [id number] Date of Birth: [birth year]  Transition of Care [facility]) CM/SW Contact:    Milinda Antis, [place] Phone Number: 01/25/2022, 4:26 PM  Clinical Narrative:                  Transition of Care Department [facility]) has reviewed patient who admitted from CIR and was intubated upon arrival to ICU.  No TOC needs have been identified at this time as patient is currently on the ventilator.   We will continue to monitor patient advancement through interdisciplinary progression rounds and follow up when patient is more stable.              Patient Goals and CMS Choice        Expected Discharge Plan and Services                                                Prior Living Arrangements/Services                       Activities of Daily Living      Permission Sought/Granted                  Emotional Assessment              Admission diagnosis:  Acute respiratory failure with hypoxia ([place]) [J96.01] Patient Active Problem List   Diagnosis Date Noted   Malnutrition of moderate degree 01/16/2022   Septic shock ([place])    AKI (acute kidney injury) ([place])    Hyperkalemia    Acute respiratory failure with hypoxia ([place]) 01/06/2022   Protein-calorie malnutrition, severe 01/11/2022   Right middle cerebral artery stroke ([place]) 01/02/2022   Internal carotid artery stenosis, right 01/01/2022   Stroke (cerebrum) ([place]) 12/29/2021   CKD (chronic kidney disease) stage 3, GFR 30-59 ml/min (HCC) 12/29/2021   Anemia 12/29/2021   Hyperlipidemia 12/29/2021   Essential hypertension 12/29/2021   Osteoarthritis of left knee 10/09/2020   S/P left TKA 10/09/2020   S/P total knee arthroplasty, left 10/09/2020   Acute blood loss anemia 03/31/2013   Osteoarthritis of right knee 03/30/2013   PCP:  [name], MD Pharmacy:   [facility] [place],  [address] [place] [address] [place] [address] Phone: [phone] Fax: [place], [address] AT [facility] [address] [place] [address] Phone: [phone] Fax: [phone]     Social Determinants of Health (SDOH) Interventions    Readmission Risk Interventions     No data to display

## 2022-01-17 NOTE — Progress Notes (Signed)
MB maintenance FP2, A2, And CZ electrodes. No skin breakdown to note. All impedances are below 10k ohms.

## 2022-01-17 NOTE — Progress Notes (Signed)
[place] Progress Note Patient Name: Chad [name] DOB: [birth year] MRN: [id number]   Date of Service  01/14/2022  HPI/Events of Note  K+ 5.4, Mg++ 1.7  eICU Interventions  Lokelma 5 gm via NG tube x 1, Mg++ sulphate 2 gm iv bolus x 1.        [name] Darene Nappi 01/12/2022, 5:26 AM

## 2022-01-17 NOTE — Progress Notes (Signed)
Brief Nutrition Note  Discussed pt with RN and during ICU rounds. Consult received for enteral nutrition initiation and management. Cortrak was replaced today after it had been dislodged during EGD. Per abdominal x-ray, Cortrak tube tip overlies the distal stomach in the pyloric region.  RD will order tube feeding recommendations from Initial Nutrition Assessment completed yesterday (6/15): - Start Osmolite 1.5 @ 20 ml/hr and advance by 10 ml q 8 hours to goal rate of 50 ml/hr (1200 ml/day) - ProSource TF 45 ml daily   Tube feeding regimen at goal rate provides 1840 kcal, 86 grams of protein, and 914 ml of H2O.  RD will continue to follow pt during acute admission.   Gustavus Bryant, MS, RD, LDN Inpatient Clinical Dietitian Please see AMiON for contact information.

## 2022-01-17 NOTE — Procedures (Signed)
Cortrak  Person Inserting Tube:  Iviona Hole C, RD Tube Type:  Cortrak - 43 inches Tube Size:  10 Tube Location:  Left nare Secured by: Bridle Technique Used to Measure Tube Placement:  Marking at nare/corner of mouth Cortrak Secured At:  67 cm   Cortrak Tube Team Note:  Consult received to place a Cortrak feeding tube.   X-ray is required, abdominal x-ray has been ordered by the Cortrak team. Please confirm tube placement before using the Cortrak tube.   If the tube becomes dislodged please keep the tube and contact the Cortrak team at [email or web address] ([email or web address]) for replacement.  If after hours and replacement cannot be delayed, place a NG tube and confirm placement with an abdominal x-ray.    Jleigh Striplin P., RD, LDN, CNSC See AMiON for contact information    

## 2022-01-17 NOTE — Procedures (Addendum)
Patient Name: SHIN LAMOUR  MRN: [id number]  Epilepsy Attending: Lora Havens  Referring Physician/Provider: Gwinda Maine, MD  Duration: 01/16/2022 1240 to 01/29/2022 0824   Patient history: 88 year old male with history of stroke had an episode of posturing right arm and left gaze.  EEG to evaluate for seizure.   Level of alertness:  lethargic    AEDs during EEG study: None   Technical aspects: This EEG study was done with scalp electrodes positioned according to the 10-20 International system of electrode placement. Electrical activity was acquired at a sampling rate of 500Hz  and reviewed with a high frequency filter of 70Hz  and a low frequency filter of 1Hz . EEG data were recorded continuously and digitally stored.    Description: EEG showed continuous generalized and and lateralized right hemisphere 3- 6 Hz theta-delta slowing. Hyperventilation and photic stimulation were not performed.      ABNORMALITY - Continuous slow, generalized and lateralized right hemisphere   IMPRESSION: This study is suggestive of cortical dysfunction arising from right hemisphere which is likely secondary to underlying strokes. Additionally there is moderate to severe diffuse encephalopathy, nonspecific etiology. No seizures or epileptiform discharges were seen throughout the recording.   Nami Strawder Barbra Sarks

## 2022-01-17 NOTE — Op Note (Signed)
[facility] Patient Name: Chad [name] Procedure Date : 01/16/2022 MRN: [id number] Attending MD: Ronnette Juniper , MD Date of Birth: 1931/09/07 CSN: [id number] Age: 88 Admit Type: Inpatient Procedure:                Upper GI endoscopy Indications:              Acute post hemorrhagic anemia, Melena Providers:                Ronnette Juniper, MD, Cletis Athens, Technician Referring MD:             Critical Care Medicine/ICU team Medicines:                Patient was on fentanyl drip and was intubated. Complications:            No immediate complications. Estimated Blood Loss:     Estimated blood loss: none. Procedure:                Pre-Anesthesia Assessment:                           - Prior to the procedure, a History and Physical                            was performed, and patient medications and                            allergies were reviewed. The patient's tolerance of                            previous anesthesia was also reviewed. The risks                            and benefits of the procedure and the sedation                            options and risks were discussed with the patient.                            All questions were answered, and informed consent                            was obtained. Prior Anticoagulants: The patient has                            taken Brilinta, last dose was 2 days prior to                            procedure. ASA Grade Assessment: IV - A patient                            with severe systemic disease that is a constant                            threat to life. After reviewing the risks and  benefits, the patient was deemed in satisfactory                            condition to undergo the procedure.                           After obtaining informed consent, the endoscope was                            passed under direct vision. Throughout the                            procedure, the patient's blood  pressure, pulse, and                            oxygen saturations were monitored continuously. The                            [id number]-[id number] ([id number] Olympus endoscope was introduced                            through the mouth, and advanced to the second part                            of duodenum. The upper GI endoscopy was                            accomplished without difficulty. The patient                            tolerated the procedure well. Scope In: Scope Out: Findings:      Food was found in the entire esophagus. Small amount of retained food       was noted in the entire esophagus. The feeding tube was noted in the       esopahgus with the tip in the gastric cavity.      A medium amount of food (residue) was found in the cardia, in the       gastric fundus and in the gastric body.      Three medium semi-sessile polyps with no bleeding and no stigmata of       recent bleeding were found on the greater curvature of the stomach.      Due to presence of retained food, adequate visualization of the cardia       and fundus could not be performed despite lavage.      Localized mildly erythematous mucosa without bleeding was found in the       gastric antrum.      The examined duodenum was normal except for some retained food. Impression:               - Food in the esophagus.                           - A medium amount of food (residue) in the stomach.                           -  Three gastric polyps.                           - Erythematous mucosa in the antrum.                           - Normal examined duodenum.                           - No specimens collected. Moderate Sedation:      Patient was on fentanyl drip and was intubated. Recommendation:           - Please reposition the NG tube and ok to start                            tube feeding today.                           - Resume IV heparin today, if there is no evidence                            of bleeding, ok to  resume Brilinta in the next 24                            hours.                           - Please recall Gi if needed. Procedure Code(s):        --- Professional ---                           [phone], Esophagogastroduodenoscopy, flexible,                            transoral; diagnostic, including collection of                            specimen(s) by brushing or washing, when performed                            (separate procedure) Diagnosis Code(s):        --- Professional ---                           Q22.297L, Food in esophagus causing other injury,                            initial encounter                           K31.7, Polyp of stomach and duodenum                           K31.89, Other diseases of stomach and duodenum                           D62, Acute posthemorrhagic anemia  K92.1, Melena (includes Hematochezia) CPT copyright 2019 American Medical Association. All rights reserved. The codes documented in this report are preliminary and upon coder review may  be revised to meet current compliance requirements. Ronnette Juniper, MD 01/06/2022 9:21:28 AM This report has been signed electronically. Number of Addenda: 0

## 2022-01-18 DIAGNOSIS — J9601 Acute respiratory failure with hypoxia: Secondary | ICD-10-CM | POA: Diagnosis not present

## 2022-01-18 LAB — COMPREHENSIVE METABOLIC PANEL
ALT: 16 U/L (ref 0–44)
AST: 27 U/L (ref 15–41)
Albumin: 2 g/dL — ABNORMAL LOW (ref 3.5–5.0)
Alkaline Phosphatase: 86 U/L (ref 38–126)
Anion gap: 14 (ref 5–15)
BUN: 57 mg/dL — ABNORMAL HIGH (ref 8–23)
CO2: 21 mmol/L — ABNORMAL LOW (ref 22–32)
Calcium: 7.7 mg/dL — ABNORMAL LOW (ref 8.9–10.3)
Chloride: 104 mmol/L (ref 98–111)
Creatinine, Ser: 3.08 mg/dL — ABNORMAL HIGH (ref 0.61–1.24)
GFR, Estimated: 19 mL/min — ABNORMAL LOW (ref >60)
Glucose, Bld: 153 mg/dL — ABNORMAL HIGH (ref 70–99)
Potassium: 3.7 mmol/L (ref 3.5–5.1)
Sodium: 139 mmol/L (ref 135–145)
Total Bilirubin: 0.6 mg/dL (ref 0.3–1.2)
Total Protein: 6.1 g/dL — ABNORMAL LOW (ref 6.5–8.1)

## 2022-01-18 LAB — CBC
HCT: 26 % — ABNORMAL LOW (ref 39.0–52.0)
Hemoglobin: 8.7 g/dL — ABNORMAL LOW (ref 13.0–17.0)
MCH: 29.2 pg (ref 26.0–34.0)
MCHC: 33.5 g/dL (ref 30.0–36.0)
MCV: 87.2 fL (ref 80.0–100.0)
Platelets: 214 10*3/uL (ref 150–400)
RBC: 2.98 MIL/uL — ABNORMAL LOW (ref 4.22–5.81)
RDW: 16.8 % — ABNORMAL HIGH (ref 11.5–15.5)
WBC: 8.9 10*3/uL (ref 4.0–10.5)
nRBC: 0 % (ref 0.0–0.2)

## 2022-01-18 LAB — GLUCOSE, CAPILLARY
Glucose-Capillary: 125 mg/dL — ABNORMAL HIGH (ref 70–99)
Glucose-Capillary: 125 mg/dL — ABNORMAL HIGH (ref 70–99)
Glucose-Capillary: 106 mg/dL — ABNORMAL HIGH (ref 70–99)
Glucose-Capillary: 111 mg/dL — ABNORMAL HIGH (ref 70–99)
Glucose-Capillary: 117 mg/dL — ABNORMAL HIGH (ref 70–99)

## 2022-01-18 LAB — MAGNESIUM
Magnesium: 2.3 mg/dL (ref 1.7–2.4)
Magnesium: 2.1 mg/dL (ref 1.7–2.4)

## 2022-01-18 LAB — PHOSPHORUS
Phosphorus: 5 mg/dL — ABNORMAL HIGH (ref 2.5–4.6)
Phosphorus: 4.5 mg/dL (ref 2.5–4.6)

## 2022-01-18 MED ORDER — HYDRALAZINE HCL 20 MG/ML IJ SOLN: 5.0000 mg | INTRAMUSCULAR | Status: DC | PRN

## 2022-01-18 MED ORDER — DEXMEDETOMIDINE HCL IN NACL 400 MCG/100ML IV SOLN
0.1000 ug/kg/h | INTRAVENOUS | Status: AC
  Administered 2022-01-18: 0.3 ug/kg/h via INTRAVENOUS
  Administered 2022-01-18: 0.4 ug/kg/h via INTRAVENOUS
  Filled 2022-01-18: qty 100

## 2022-01-18 MED ORDER — HYDRALAZINE HCL 20 MG/ML IJ SOLN
10.0000 mg | INTRAMUSCULAR | Status: DC | PRN
  Administered 2022-01-18 – 2022-01-23 (×8): 20 mg via INTRAVENOUS
  Filled 2022-01-18 (×8): qty 1

## 2022-01-18 MED ORDER — FUROSEMIDE 10 MG/ML IJ SOLN
40.0000 mg | Freq: Once | INTRAMUSCULAR | Status: AC
  Administered 2022-01-18: 40 mg via INTRAVENOUS
  Filled 2022-01-18: qty 4

## 2022-01-18 MED ORDER — DEXMEDETOMIDINE HCL IN NACL 400 MCG/100ML IV SOLN
0.1000 ug/kg/h | INTRAVENOUS | Status: DC
  Administered 2022-01-18: 0.4 ug/kg/h via INTRAVENOUS
  Filled 2022-01-18: qty 100

## 2022-01-18 MED ORDER — BETHANECHOL CHLORIDE 10 MG PO TABS
5.0000 mg | ORAL_TABLET | Freq: Three times a day (TID) | ORAL | Status: DC
  Administered 2022-01-18 – 2022-01-26 (×24): 5 mg
  Filled 2022-01-18 (×27): qty 1

## 2022-01-18 MED ORDER — FENTANYL CITRATE (PF) 100 MCG/2ML IJ SOLN
25.0000 ug | INTRAMUSCULAR | Status: DC | PRN
  Administered 2022-01-18 – 2022-01-20 (×3): 50 ug via INTRAVENOUS
  Filled 2022-01-18 (×3): qty 2

## 2022-01-18 MED ORDER — ONDANSETRON HCL 4 MG/2ML IJ SOLN
4.0000 mg | Freq: Four times a day (QID) | INTRAMUSCULAR | Status: DC
  Administered 2022-01-18 – 2022-01-20 (×7): 4 mg via INTRAVENOUS
  Filled 2022-01-18 (×7): qty 2

## 2022-01-18 NOTE — Plan of Care (Signed)

## 2022-01-18 NOTE — Progress Notes (Signed)
Daily Progress Note   Patient Name: Chad [name]       Date: 01/18/2022 DOB: 26-Mar-1932  Age: 88 y.o. MRN#: [id number] Attending Physician: Juanito Doom, MD Primary Care Physician: [name], MD Admit Date: 01/11/2022  Reason for Consultation/Follow-up: Establishing goals of care  Subjective: No family at bedside during my evaluation.  Patient remains intubated and on Precedex infusion.  Length of Stay: 3  Current Medications: Scheduled Meds:   aspirin  81 mg Per Tube Daily   bethanechol  5 mg Per Tube TID   Chlorhexidine Gluconate Cloth  6 each Topical Q0600   docusate  100 mg Per Tube BID   feeding supplement (PROSource TF)  45 mL Per Tube Daily   heparin injection (subcutaneous)  5,000 Units Subcutaneous Q8H   insulin aspart  0-15 Units Subcutaneous Q4H   mouth rinse  15 mL Mouth Rinse Q2H   pantoprazole (PROTONIX) IV  40 mg Intravenous Q12H   polyethylene glycol  17 g Per Tube Daily   rosuvastatin  20 mg Per Tube Daily   ticagrelor  90 mg Per Tube BID    Continuous Infusions:  sodium chloride 10 mL/hr at 01/16/2022 0728   ampicillin-sulbactam (UNASYN) IV 3 g (01/18/22 0814)   dexmedetomidine (PRECEDEX) IV infusion 0.3 mcg/kg/hr (01/18/22 0600)   feeding supplement (OSMOLITE 1.5 CAL) 40 mL/hr at 01/18/22 0600    PRN Meds: acetaminophen (TYLENOL) oral liquid 160 mg/5 mL **OR** acetaminophen, fentaNYL (SUBLIMAZE) injection, hydrALAZINE  Physical Exam Constitutional:      General: He is not in acute distress.    Appearance: He is ill-appearing.     Comments: sedated  Cardiovascular:     Rate and Rhythm: Normal rate.  Pulmonary:     Effort: Pulmonary effort is normal.     Comments: Intubated Skin:    General: Skin is warm and dry.             Vital Signs: BP (!)  158/68   Pulse 76   Temp 98.3 F (36.8 C) (Oral)   Resp 17   Wt 66.8 kg   SpO2 99%   BMI 27.83 kg/m  SpO2: SpO2: 99 % O2 Device: O2 Device: Ventilator O2 Flow Rate: O2 Flow Rate (L/min): 4 L/min  Intake/output summary:  Intake/Output Summary (Last 24 hours) at 01/18/2022 0951 Last data filed at 01/18/2022 0754 Gross per 24 hour  Intake 584.77 ml  Output 1750 ml  Net -1165.23 ml    LBM: Last BM Date : 01/26/2022 Baseline Weight: Weight: 66.8 kg Most recent weight: Weight: 66.8 kg       Flowsheet Rows    Flowsheet Row Most Recent Value  Intake Tab   Referral Department Hospitalist  Unit at Time of Referral ICU  Palliative Care Primary Diagnosis Sepsis/Infectious Disease  Date Notified 01/23/2022  Palliative Care Type New Palliative care  Reason for referral Clarify Goals of Care  Date of Admission 01/20/2022  Date first seen by Palliative Care 01/27/2022  # of days Palliative referral response time 0 Day(s)  # of days IP prior to Palliative referral 0  Clinical Assessment   Psychosocial & Spiritual Assessment   Palliative Care Outcomes  Patient Active Problem List   Diagnosis Date Noted   Malnutrition of moderate degree 01/16/2022   Septic shock (HCC)    AKI (acute kidney injury) ([place])    Hyperkalemia    Acute respiratory failure with hypoxia ([place]) 01/29/2022   Protein-calorie malnutrition, severe 01/11/2022   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 male   with past medical history of HTN, CKD, hld, and chronic pain admitted on 01/11/2022 with respiratory distress.  Patient initially admitted 5/29 with stroke and underwent balloon angioplasty of ICA.  He was transferred to CIR on 6/1.  During CIR admission his swallowing worsened and he had a feeding tube placed 6/9.  On 6/14 he developed respiratory distress and required readmission to the hospital with eventual transfer to ICU and intubation.  Patient also with a AKI and hyperkalemia.  Also with hemoglobin of 6.1.  GI consulted.  Found to have lactic acid of 4.  PMT consulted to discuss goals of care.  Assessment: Discussed situation with Dr. [place] Bells. Patient is now off vasopressors.  He will also follow some commands per nursing staff.  Hemoglobin 8.7.  Creatinine remains around 3, potassium better.  Reached out to patient's daughter Chad [name].  We reviewed clinical situation and all of her questions and concerns were addressed.  We reviewed need for more defined goals of care.  Chad [name] expresses she is really not sure what the goals are.  We discussed the option for a family meeting to include the critical care physician myself and what ever family members would like to be included so we can further detail situation and try to define goals of care however Chad [name] tells me she would like some more time before we schedule a meeting like this.  She tells me family will be at the bedside today and she is going to attempt to have more conversations with other family members to further define goals of care herself.  We did discuss the option of potential extubation in the coming days but medical team needs to know i how to respond if patient does not tolerate extubation: reintubate or transition to focus more on his comfort.  Chad [name] expresses uncertainty about how to proceed to this.  I expressed to Chad [name] we would be available for ongoing discussions, she is agreeable to continued palliative  follow-up.  Recommendations/Plan: Ongoing goals of care discussions  Care plan was discussed with Dr. Lake Bells, daughter Chad [name]  Thank you for allowing the Palliative Medicine Team to assist in the care of this patient.   *Please note that this is a verbal dictation therefore any spelling or grammatical errors are due to the "[place]" system interpretation.  Juel Burrow, DNP, [facility] Palliative Medicine Team Team Phone # [phone]  Pager [phone]

## 2022-01-18 NOTE — Progress Notes (Signed)
NAME:  Chad [name], MRN:  [id number], DOB:  1931/09/15, LOS: 3 ADMISSION DATE:  01/10/2022, CONSULTATION DATE:  6/14 REFERRING MD:  Sloan Leiter, CHIEF COMPLAINT:  Dyspnea after aspiration   History of Present Illness:  88 y/o male was admitted for stroke on 5/28, had angioplasty of R ICA, then moved to rehab on 6/1.  At rehab had recurrent aspiration.  Admitted to ICU on 6/14 in setting of aspiration of tube feeds leading to acute respiratory failure with hypoxemia.    Pertinent  Medical History  Arthritis Heart murmur Hypertension Stroke  Significant Hospital Events: Including procedures, antibiotic start and stop dates in addition to other pertinent events   6/14 Admit from CIR, intubated upon arrival to ICU, concern for GI bleed, shock 6/15 convulsive movements of R arm, started LTM EEG, head CT findings worrisome for new stroke 6/16 EGD: gastric polyps noted, erythema of gastric antrum, no ulcer, food in esophagus and stomach   Interim History / Subjective:   Foley removed, no urine output but he has urinary retention on bladder scanning requiring in/out foley catheter Agitated somewhat, requiring Precedex Weaning on vent but has Cheyne-Stokes breathing  Objective   Blood pressure (!) 163/81, pulse 75, temperature 98.3 F (36.8 C), temperature source Oral, resp. rate 19, weight 66.8 kg, SpO2 100 %.    Vent Mode: PRVC FiO2 (%):  [30 %] 30 % Set Rate:  [16 bmp] 16 bmp Vt Set:  [420 mL] 420 mL PEEP:  [5 cmH20] 5 cmH20 Plateau Pressure:  [18 cmH20-22 cmH20] 18 cmH20   Intake/Output Summary (Last 24 hours) at 01/18/2022 0746 Last data filed at 01/18/2022 0600 Gross per 24 hour  Intake 627.94 ml  Output 1750 ml  Net -1122.06 ml   Filed Weights   01/18/22 0500  Weight: 66.8 kg    Examination:  General:  In bed on vent HENT: NCAT ETT in place PULM: CTA B, vent supported breathing CV: RRR, no mgr GI: BS+, soft, nontender MSK: normal bulk and tone Derm: significant  edema in arms Neuro: awake on vent, follows commands (raised R hand when I asked him)   Resolved Hospital Problem list     Assessment & Plan:  Acute respiratory failure with hypoxemia due to aspiration pneumonia, organism unspecified Not ready for extubation 6/17 due to Cheyne-Stoke's breathing pattern and unclear ability to protect airway; need family to give definitive goals of care plan prior to considering extubation because he would be high risk for re-intubation  Aspiration pneumonia Full mechanical vent support VAP prevention Daily WUA/SBT Plan continue pressure Unasyn 5 day course  AKI on CKD IIIa due to ATN Hyperkalemia Anasarca 6/17 Bladder outlet obstruction Lasix x1 Monitor BMET and UOP Replace electrolytes as needed Replace foley, start bethanecol  GI bleed (large melanotic stool on 6/14), no clear etiology defined by EGD on 6/16; no ongoing bleeding 6/17 after starting asa, brillinta for new stroke Monitor for bleeding Transfuse PRBC for Hgb < 7 gm/dL Protonix  Acute CVA with left hemiplegia, new stroke on R noted on CT head 6/15 Seizure 6/15 Continue asa, brillinta [place] for bleeding F/u Neuro recommendations TTM EEG   Chronic HFpEF Stop midodrine today Stop levophed Lasix x1  DM2 Continue SSI as ordered Monitor CBG  Goals of care Ongoing conversations with family, see prior notes.  They were not present at bedside today, discussed with palliative medicine who will reach out to them.  Overall very high risk situation because of bleeding and stroke; likelihood of  death is very high and benefit of ongoing high level mechanical ventilation is unlikely to lead to neurologic or functional recovery to the point of independent living.  Best Practice (right click and "Reselect all SmartList Selections" daily)   Diet/type: tubefeeds DVT prophylaxis: prophylactic heparin  GI prophylaxis: PPI Lines: N/A Foley:  Yes, and it is still needed Code Status:   full code Last date of multidisciplinary goals of care discussion [daily, see above]  Critical care time: 35 minutes    Roselie Awkward, MD [place] [facility] Pager: [phone] Cell: [phone] After 7:00 pm call Elink  [phone]

## 2022-01-18 NOTE — Progress Notes (Signed)
STROKE TEAM PROGRESS NOTE   SUBJECTIVE (INTERVAL HISTORY) No family is at the bedside.   He remains intubated for respiratory failure and sedated.  He has not had any witnessed seizure activities.  Long-term EEG shows focal slowing but no definite epileptiform activity.  Vital signs are stable.  OBJECTIVE Temp:  [98.2 F (36.8 C)-99 F (37.2 C)] 98.3 F (36.8 C) (06/17 1119) Pulse Rate:  [65-190] 66 (06/17 1056) Cardiac Rhythm: Normal sinus rhythm (06/17 0754) Resp:  [9-36] 17 (06/17 1056) BP: (88-192)/(48-156) 162/69 (06/17 1056) SpO2:  [95 %-100 %] 100 % (06/17 1056) FiO2 (%):  [30 %] 30 % (06/17 1056) Weight:  [66.8 kg] 66.8 kg (06/17 0500)  Recent Labs  Lab 01/16/2022 1944 01/02/2022 2353 01/18/22 0339 01/18/22 0708 01/18/22 1118  GLUCAP 125* 119* 125* 125* 106*   Recent Labs  Lab 01/11/22 1650 01/12/22 0522 01/20/2022 0703 01/16/22 0300 01/16/22 0859 01/16/22 1208 01/25/2022 0339 01/21/2022 1623 01/18/22 0429  NA  --  146*   < > 135 138 138 137  --  139  K  --  3.3*   < > 5.7* >7.5* 5.6* 5.4*  --  3.7  CL  --  116*   < > 101 104 105 103  --  104  CO2  --  22   < > 21* 23 23 21*  --  21*  GLUCOSE  --  134*   < > 97 105* 115* 121*  --  153*  BUN  --  30*   < > 58* 58* 56* 57*  --  57*  CREATININE  --  1.30*   < > 3.24* 3.12* 3.05* 3.01*  --  3.08*  CALCIUM  --  8.3*   < > 8.3* 8.4* 8.2* 8.0*  --  7.7*  MG 1.9 1.9  --   --   --   --  1.7 2.2 2.3  PHOS 2.2* 1.8*  --   --   --   --  5.3* 5.5* 5.0*   < > = values in this interval not displayed.   Recent Labs  Lab 01/20/2022 0703 01/29/2022 1118 01/16/22 0300 01/24/2022 0339 01/18/22 0429  AST $Re'27 25 22 27 27  'gua$ ALT $R'15 14 13 15 16  'Wq$ ALKPHOS 58 53 53 66 86  BILITOT 0.6 0.6 0.5 0.7 0.6  PROT 6.7 5.9* 5.4* 5.7* 6.1*  ALBUMIN 2.4* 2.1* 1.8* 2.0* 2.0*   Recent Labs  Lab 01/13/2022 0703 01/31/2022 1118 01/24/2022 1202 01/20/2022 2335 01/16/22 0300 01/05/2022 0339 01/18/22 0429  WBC 14.2* 14.3*  --   --  18.4* 13.5* 8.9  NEUTROABS  9.9*  --   --   --  11.1*  --   --   HGB 8.0* 5.6* 6.1* 9.9* 9.7* 9.5* 8.7*  HCT 24.4* 17.2* 18.0* 27.2* 27.5* 28.6* 26.0*  MCV 84.1 86.4  --   --  88.7 89.1 87.2  PLT 298 278  --   --  212 250 214   No results for input(s): "CKTOTAL", "CKMB", "CKMBINDEX", "TROPONINI" in the last 168 hours. No results for input(s): "LABPROT", "INR" in the last 72 hours. No results for input(s): "COLORURINE", "LABSPEC", "PHURINE", "GLUCOSEU", "HGBUR", "BILIRUBINUR", "KETONESUR", "PROTEINUR", "UROBILINOGEN", "NITRITE", "LEUKOCYTESUR" in the last 72 hours.  Invalid input(s): "APPERANCEUR"      Component Value Date/Time   CHOL 149 12/30/2021 0412   TRIG 68 12/30/2021 0412   HDL 45 12/30/2021 0412   CHOLHDL 3.3 12/30/2021 0412   VLDL 14 12/30/2021 0412  [facility] 90 12/30/2021 0412   Lab Results  Component Value Date   HGBA1C 6.1 (H) 12/29/2021   No results found for: "LABOPIA", "COCAINSCRNUR", "LABBENZ", "AMPHETMU", "THCU", "LABBARB"  No results for input(s): "ETH" in the last 168 hours.  I have personally reviewed the radiological images below and agree with the radiology interpretations.  DG Abd Portable 1V  Result Date: 01/15/2022 CLINICAL DATA:  Feeding tube EXAM: PORTABLE ABDOMEN - 1 VIEW COMPARISON:  None Available. FINDINGS: Enteric tube is present with tip overlying the distal stomach in the pyloric region. There is some contrast within the fundus of the stomach and colon. IMPRESSION: Enteric tube tip overlies the distal stomach. Electronically Signed   By: Macy Mis M.D.   On: 01/28/2022 10:51   Overnight EEG with video  Result Date: 01/24/2022 Lora Havens, MD     01/15/2022  9:51 AM Patient Name: Chad [name] MRN: [id number] Epilepsy Attending: Lora Havens Referring Physician/Provider: Gwinda Maine, MD Duration: 01/16/2022 1240 to 01/27/2022 0824  Patient history: 88 year old male with history of stroke had an episode of posturing right arm and left gaze.  EEG to  evaluate for seizure.  Level of alertness:  lethargic  AEDs during EEG study: None  Technical aspects: This EEG study was done with scalp electrodes positioned according to the 10-20 International system of electrode placement. Electrical activity was acquired at a sampling rate of $Remov'500Hz'tkrLLK$  and reviewed with a high frequency filter of $RemoveB'70Hz'rUDSshiH$  and a low frequency filter of $RemoveB'1Hz'sBdVhcUh$ . EEG data were recorded continuously and digitally stored.  Description: EEG showed continuous generalized and and lateralized right hemisphere 3- 6 Hz theta-delta slowing. Hyperventilation and photic stimulation were not performed.    ABNORMALITY - Continuous slow, generalized and lateralized right hemisphere  IMPRESSION: This study is suggestive of cortical dysfunction arising from right hemisphere which is likely secondary to underlying strokes. Additionally there is moderate to severe diffuse encephalopathy, nonspecific etiology. No seizures or epileptiform discharges were seen throughout the recording.  Lora Havens    EEG adult  Result Date: 01/16/2022 Lora Havens, MD     01/16/2022 12:45 PM Patient Name: Chad [name] MRN: [id number] Epilepsy Attending: Lora Havens Referring Physician/Provider: Corey Harold, NP Date: 01/16/2022 Duration: 21.43 mins Patient history: 88 year old male with history of stroke had an episode of posturing right arm and left gaze.  EEG to evaluate for seizure. Level of alertness:  lethargic AEDs during EEG study: Ativan Technical aspects: This EEG study was done with scalp electrodes positioned according to the 10-20 International system of electrode placement. Electrical activity was acquired at a sampling rate of $Remov'500Hz'erOzQr$  and reviewed with a high frequency filter of $RemoveB'70Hz'VCyxVigo$  and a low frequency filter of $RemoveB'1Hz'IxeKzRDl$ . EEG data were recorded continuously and digitally stored. Description: EEG showed continuous generalized and maximal left frontal 3- 6 Hz theta-delta slowing which at times appears sharply  contoured. Hyperventilation and photic stimulation were not performed.   ABNORMALITY - Continuous slow, generalized IMPRESSION: This study is suggestive of moderate to severe diffuse encephalopathy, nonspecific etiology. No seizures or epileptiform discharges were seen throughout the recording. Priyanka Barbra Sarks   CT HEAD WO CONTRAST (5MM)  Result Date: 01/16/2022 CLINICAL DATA:  Mental status change, unknown cause. EXAM: CT HEAD WITHOUT CONTRAST TECHNIQUE: Contiguous axial images were obtained from the base of the skull through the vertex without intravenous contrast. RADIATION DOSE REDUCTION: This exam was performed according to the departmental dose-optimization program which includes automated exposure control, adjustment  of the mA and/or kV according to patient size and/or use of iterative reconstruction technique. COMPARISON:  CT angiogram head/neck 12/30/2021. Brain MRI 12/30/2021. FINDINGS: Brain: Moderate cerebral atrophy. New from the prior head CT of 12/30/2021, there is a small to moderate-sized acute/subacute cortical and subcortical infarct within the right frontoparietal lobes and right insula (right MCA vascular territory). Additional known small subacute infarcts within the right MCA vascular territory were better appreciated on the prior brain MRI of 12/30/2021 (acute at that time). Background advanced patchy and ill-defined hypoattenuation within the cerebral white matter, nonspecific but compatible chronic small vessel ischemic disease. Redemonstrated chronic lacunar infarcts within the left basal ganglia and thalamus. There is no acute intracranial hemorrhage. No extra-axial fluid collection. No evidence of an intracranial mass. No midline shift. Vascular: Atherosclerotic calcifications. Redemonstrated focus of calcification along an M2 right middle cerebral artery, which may reflect a calcified embolus. Skull: No fracture or aggressive osseous lesion. Sinuses/Orbits: No mass or acute finding  within the imaged orbits. Trace mucosal thickening within the bilateral ethmoid sinuses. IMPRESSION: New from the prior head CT of 12/30/2021, there is a small to moderate-sized acute/subacute cortical and subcortical infarct within the right frontoparietal lobes and right insula (right MCA vascular territory). Additional known small subacute infarcts within the right MCA vascular territory were better appreciated on the prior brain MRI of 12/30/2021 (acute at that time). Background advanced chronic small vessel ischemic changes within the cerebral white matter. Redemonstrated chronic lacunar infarcts within the left basal ganglia and left thalamus. Moderate generalized cerebral atrophy. Electronically Signed   By: Kellie Simmering D.O.   On: 01/16/2022 11:28   US RENAL  Result Date: 01/29/2022 CLINICAL DATA:  Renal dysfunction EXAM: RENAL / URINARY TRACT ULTRASOUND COMPLETE COMPARISON:  None Available. FINDINGS: Right Kidney: Renal measurements: 8.9 x 5 x 5 cm = volume: 115.2 mL. There is no hydronephrosis. There is increased cortical echogenicity. Left Kidney: Renal measurements: 10.3 x 6.2 x 5 cm = volume: 165.9 mL. There is no hydronephrosis. There is increased cortical echogenicity. Bladder: Foley catheter is seen in the bladder. Bladder is not adequately distended for evaluation. Other: 1.1 cm splenule is noted adjacent to the inferior margin of spleen. Left pleural effusion is seen. IMPRESSION: There is no hydronephrosis. Increased cortical echogenicity suggests medical renal disease. Left pleural effusion. Electronically Signed   By: Elmer Picker M.D.   On: 01/29/2022 12:21   Portable Chest x-ray  Result Date: 01/11/2022 CLINICAL DATA:  Intubation EXAM: PORTABLE CHEST 1 VIEW COMPARISON:  01/19/2022, 3:13 a.m. FINDINGS: Interval endotracheal intubation, tip approximately 2 cm above the carina. Enteric feeding tube remains in position, partially imaged. Cardiomegaly. Diffuse bilateral heterogeneous  and interstitial airspace opacity, unchanged. IMPRESSION: 1. Interval endotracheal intubation, tip approximately 2 cm above the carina. Enteric feeding tube remains in position, partially imaged. 2. Cardiomegaly. Diffuse bilateral heterogeneous and interstitial airspace opacity, unchanged. Electronically Signed   By: Delanna Ahmadi M.D.   On: 01/17/2022 10:39   DG Abd 1 View  Result Date: 01/31/2022 CLINICAL DATA:  88 year old male with abdominal pain and vomiting. EXAM: ABDOMEN - 1 VIEW COMPARISON:  01/10/2022. FINDINGS: Portable AP supine views at 0704 hours. Patient is mildly rotated to the left but the enteric tube tip is now in the gastric body. Extensive Calcified aortic atherosclerosis. Pelvic vascular calcifications. Progressive opacification of the visible left lung base, left hemidiaphragm now largely obscured. Decreased large bowel gas and borderline to mildly dilated gas-filled mid abdominal small bowel loops now. Spinal scoliosis  and degeneration. No acute osseous abnormality identified. IMPRESSION: 1. Enteric tube tip now in the gastric body. 2. Decreased large bowel gas and borderline to mildly dilated mid abdominal small bowel loops. This could reflect ileus or developing small bowel obstruction. Follow-up abdominal radiographs may be valuable. 3. Progressive opacification at the left lung base since 01/10/2022, see portable chest x-ray 0313 hours today. Electronically Signed   By: Genevie Ann M.D.   On: 01/23/2022 07:37   DG Chest Port 1 View  Result Date: 01/27/2022 CLINICAL DATA:  Acute respiratory distress EXAM: PORTABLE CHEST 1 VIEW COMPARISON:  03/23/2022 FINDINGS: Cardiomegaly. Vascular congestion. Aortic atherosclerosis. Perihilar airspace opacities bilaterally. No effusions. No acute bony abnormality. IMPRESSION: Perihilar airspace opacities, likely edema although pneumonia not excluded. Electronically Signed   By: Rolm Baptise M.D.   On: 01/17/2022 03:29   DG Swallowing Func-Speech  Pathology  Result Date: 01/10/2022 Table formatting from the original result was not included. Objective Swallowing Evaluation: Type of Study: MBS-Modified Barium Swallow Study  Patient Details Name: Chad [name] MRN: [id number] Date of Birth: 08/29/1931 Today's Date: 01/10/2022 Time: SLP Start Time (ACUTE ONLY): 1100 -SLP Stop Time (ACUTE ONLY): 1123 SLP Time Calculation (min) (ACUTE ONLY): 23 min Past Medical History: Past Medical History: Diagnosis Date  Arthritis   Heart murmur   asa child   Hypertension   Stroke [facility])  Past Surgical History: Past Surgical History: Procedure Laterality Date  EYE SURGERY Bilateral 2022  IR ANGIO INTRA EXTRACRAN SEL COM CAROTID INNOMINATE UNI R MOD SED  01/01/2022  IR CT HEAD LTD  01/01/2022  IR PTA INTRACRANIAL  01/01/2022  JOINT REPLACEMENT    RADIOLOGY WITH ANESTHESIA N/A 01/01/2022  Procedure: Angiogram;  Surgeon: Luanne Bras, MD;  Location: [place];  Service: Radiology;  Laterality: N/A;  TONSILLECTOMY    TOTAL KNEE ARTHROPLASTY Right 03/30/2013  Procedure: RIGHT TOTAL KNEE ARTHROPLASTY;  Surgeon: Tobi Bastos, MD;  Location: [facility];  Service: Orthopedics;  Laterality: Right;  TOTAL KNEE ARTHROPLASTY Left 10/09/2020  Procedure: TOTAL KNEE ARTHROPLASTY;  Surgeon: Paralee Cancel, MD;  Location: [facility];  Service: Orthopedics;  Laterality: Left;  70 mins HPI: 88 yo male presenting to the ED on 5/28 with L sided weakness, facial droop, slurred speech, and fall. 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 PMH including hypothyroidism chronic pain syndromes, CKD, HTN, arthritis, bil TKA.  Subjective: alert, friendly  Recommendations for follow up therapy are one component of a multi-disciplinary discharge planning process, led by the attending physician.  Recommendations may be updated based on patient status, additional functional criteria and insurance authorization. Assessment / Plan / Recommendation   01/10/2022   8:50 PM  Clinical Impressions Clinical Impressions SLP Visit Diagnosis MBSS completed secondary to clinical concerns for aspiration of current diet textures (puree and nectar-thick liquids). At this time, pt presents with severe oropharyngeal dysphagia, with significant UES dysfunction, which resulted in significant posterior tracheal sensed aspiration (PAS 7) of pureed texture via interarytenoid space due to overflow of pyriform sinus residuals into laryngeal vestibule. Unfortunately, pharyngeal stasis and pyriform sinus residuals could not clear due to poor UES relaxation with mild esophageal backflow to pyriform sinuses. Additionally, delayed sensation of gross aspiration of nectar-thick liquid via tsp was appreciated during the swallow. [name] cough response was not effective in pulmonary clearance. Pt required tactile, hyoglossal assistance and verbal cues, from SLP, to initiate swallow response during puree trials. It should be noted that this is a  significant change from pt's MBSS results documented on 01/02/2022.   Given pt's risk severity for development of aspiration PNA (Total A for oral care, decreased ambulation, deconditioning) and change in swallow function, recommend initiation of NPO with temporary alternate means of nutrition, hydration, and medications at this time. May consider GI referral to assess UES function if improvement is not noted with therapy alone. [name] daughter present and results + recommendations were reviewed with pt an daughter who verbalized agreement with recommendations following education. MD, LPN, and PA notified of recommendations. Please see imaging report for full details. Dysphagia, oropharyngeal phase (R13.12);Cognitive communication deficit (R41.841) Impact on safety and function Severe aspiration risk;Risk for inadequate nutrition/hydration     01/02/2022  11:00 AM Treatment Recommendations Treatment Recommendations Therapy as outlined in treatment plan below     01/10/2022   1:59 PM  Prognosis Prognosis for Safe Diet Advancement Good Barriers to Reach Goals Cognitive deficits;Severity of deficits   01/10/2022   8:50 PM Diet Recommendations SLP Diet Recommendations NPO;Alternative means - temporary Medication Administration Via alternative means Postural Changes Other (Comment)     01/10/2022   8:50 PM Other Recommendations Recommended Consults Consider GI evaluation;Consider esophageal assessment Oral Care Recommendations Oral care QID Other Recommendations Have oral suction available   01/10/2022   8:50 PM Frequency and Duration  Treatment Duration 2 weeks     01/10/2022   1:53 PM Oral Phase Oral Phase Impaired Oral - Nectar Teaspoon Weak lingual manipulation;Lingual pumping;Delayed oral transit;Decreased bolus cohesion;Premature spillage;Lingual/palatal residue;Piecemeal swallowing;Reduced posterior propulsion Oral - Nectar Cup NT Oral - Nectar Straw NT Oral - Thin Cup NT Oral - Thin Straw NT Oral - Puree Weak lingual manipulation;Lingual pumping;Incomplete tongue to palate contact;Lingual/palatal residue;Delayed oral transit;Decreased bolus cohesion;Premature spillage;Holding of bolus;Piecemeal swallowing Oral - Mech Soft NT Oral - Regular NT Oral - Multi-Consistency NT Oral - Pill NT    01/10/2022   1:54 PM Pharyngeal Phase Pharyngeal Phase Impaired Pharyngeal- Nectar Teaspoon Delayed swallow initiation-pyriform sinuses;Reduced pharyngeal peristalsis;Reduced airway/laryngeal closure;Moderate aspiration;Inter-arytenoid space residue;Pharyngeal residue - cp segment;Pharyngeal residue - posterior pharnyx;Pharyngeal residue - pyriform;Pharyngeal residue - valleculae;Lateral channel residue;Penetration/Aspiration during swallow;Reduced laryngeal elevation Pharyngeal Material enters airway, passes BELOW cords and not ejected out despite cough attempt by patient Pharyngeal- Nectar Cup NT Pharyngeal- Nectar Straw NT Pharyngeal- Thin Cup NT Pharyngeal- Thin Straw NT Pharyngeal- Puree Delayed swallow  initiation-pyriform sinuses;Reduced pharyngeal peristalsis;Reduced anterior laryngeal mobility;Reduced tongue base retraction;Penetration/Apiration after swallow;Moderate aspiration;Pharyngeal residue - pyriform;Pharyngeal residue - posterior pharnyx;Pharyngeal residue - cp segment;Inter-arytenoid space residue;Lateral channel residue;Compensatory strategies attempted (with notebox) Pharyngeal Material enters airway, passes BELOW cords and not ejected out despite cough attempt by patient Pharyngeal- Mechanical Soft NT Pharyngeal- Regular NT Pharyngeal- Multi-consistency NT Pharyngeal- Pill NT    01/10/2022   1:58 PM Cervical Esophageal Phase  Cervical Esophageal Phase Impaired Nectar Teaspoon Reduced cricopharyngeal relaxation Nectar Cup NT Nectar Straw NT Puree Esophageal backflow into the pharynx;Reduced cricopharyngeal relaxation Mechanical Soft NT Regular NT Multi-consistency NT Pill NT Cervical Esophageal Comment Decreased relaxation of the UES observed with significant pyriform sinus residuals Bethany A Lutes 01/10/2022, 9:06 PM                     DG Abd Portable 1V  Result Date: 01/10/2022 CLINICAL DATA:  Feeding tube placement. EXAM: PORTABLE ABDOMEN - 1 VIEW COMPARISON:  None Available. FINDINGS: Feeding tube tip is at the level of the gastric antrum. Small rounded densities are seen in the region of the colon likely related to diverticula. There  is a heterogeneous rounded density projecting over the right upper quadrant measuring 3.0 x 3.0 cm. This is indeterminate. Lung bases are clear. There is mild curvature of the lumbar spine with degenerative change. IMPRESSION: 1. Feeding tube tip is at the level of the gastric antrum. 2. Scattered colonic diverticula. 3. 3 cm heterogeneous density in the right upper quadrant, indeterminate. Findings may represent a large gallstone, renal calcification or other calcified mass. Please correlate clinically. This can be further evaluated with CT. Electronically Signed    By: Ronney Asters M.D.   On: 01/10/2022 15:07   IR ANGIO INTRA EXTRACRAN SEL COM CAROTID INNOMINATE UNI R MOD SED  Result Date: 01/03/2022 CLINICAL DATA:  Left-sided weakness with dysarthria. Patient with calcified embolus in the inferior division right MCA. Discovered to have high-grade stenosis of the extracranial right ICA proximally EXAM: IR ANGIO INTRA EXTRACRAN SEL COM CAROTID INNOMINATE UNI RIGHT MOD SED COMPARISON:  CT angiogram of the head and neck of Dec 30, 2021. MEDICATIONS: Heparin 3,000 units IV. Ancef 2 g IV antibiotic was administered within 1 hour of the procedure. ANESTHESIA/SEDATION: General anesthesia. CONTRAST:  Omnipaque 300 approximately 120 cc. FLUOROSCOPY TIME:  Fluoroscopy Time: 76 minutes 36) seconds (1257 mGy). COMPLICATIONS: None immediate. TECHNIQUE: Informed written consent was obtained from the patient after a thorough discussion of the procedural risks, benefits and alternatives. All questions were addressed. Maximal Sterile Barrier Technique was utilized including caps, mask, sterile gowns, sterile gloves, sterile drape, hand hygiene and skin antiseptic. A timeout was performed prior to the initiation of the procedure. The left groin was prepped and draped in the usual sterile fashion. Thereafter using modified Seldinger technique, transfemoral access into the left common femoral artery was obtained without difficulty. Over a 0.035 inch guidewire, an 8 French 25 cm Pinnacle sheath was inserted. Through this, and also over 0.035 inch guidewire, a 120 cm 6 French Simmons 2 catheter inside of an 087 95 cm balloon guide catheter combination was advanced to the aortic arch region and selectively positioned in the proximal right common carotid artery and the innominate artery. An arteriogram was then performed centered extra cranially and intracranially. FINDINGS: The innominate arteriogram demonstrates significant tortuosity of the innominate artery in its entirety extending into the  right common carotid artery with a 180 degree loop in the proximal right common carotid artery. Subclavian artery demonstrates patency with opacification of a hypoplastic right vertebral artery extra cranially. The right common carotid arteriogram demonstrates complex atherosclerotic calcified plaque at the right common carotid bifurcation extending into the bulb region of the right internal carotid artery resulting in a high-grade approximately 80-90% stenosis. More distally, the right internal carotid artery opacifies to the cranial skull base. Patency is seen of the petrous, cavernous and supraclinoid right ICA with a prominent right posterior communicating artery. Proximal portions of the right middle cerebral artery and the right anterior cerebral artery demonstrate patency. ENDOVASCULAR REVASCULARIZATION OF HIGH-GRADE STENOSIS OF THE RIGHT INTERNAL CAROTID ARTERY PROXIMALLY WITH BALLOON ANGIOPLASTY The balloon guide catheter was advanced into the proximal right common carotid artery. Through this, and also over a 0.014 inch Transend EX soft tip micro guidewire, an 021 150 cm microcatheter was then advanced with biplane DSA roadmap to the proximal cavernous right ICA followed by the microcatheter. The micro guidewire was removed. Good aspiration obtained from the hub of the microcatheter. A gentle control arteriogram performed through the microcatheter demonstrated safe positioning of the tip of the microcatheter. This in turn was then exchanged out for  a 300 cm 014 inch BMW exchange wire with a moderate J configuration. The tip of the micro guidewire was positioned at the petrous cavernous junction. Measurements were then performed of the right internal carotid artery distal to the stenosis, and just proximal to the stenosis of the distal common carotid artery. A 5 mm x 20 mm 014 inch Viatrac balloon guide catheter was then prepped and purged retrogradely with heparinized saline infusion, and retrogradely with  50% contrast and 50% heparinized saline infusion. Using the rapid exchange technique, the balloon catheter was then advanced without difficulty and positioned with the distal and proximal markers adequate distant from the site of the severe stenosis in the proximal right internal carotid artery. Slow control inflation was then performed using micro inflation syringe device via micro tubing. Inflations were performed slowly to 9 atmospheres where it was maintained for approximately 60 seconds. Thereafter, the balloon was deflated and retrieved and removed. A control arteriogram performed through the balloon guide catheter in the proximal right common carotid artery demonstrated significantly improved caliber and flow through the angioplastied segment. More distally, free flow was noted into the distal right ICA intracranially. Over the exchange BMW micro guidewire, a 8 mm x 26 mm Wallstent stent delivery system which had been prepped with heparinized saline infusion was then advanced again using the rapid exchange technique to the proximal right common carotid artery. Resistance was encountered to further advancement of the stent delivery apparatus. At this time the balloon guide catheter was advanced to the distal right common carotid artery. Further attempts were made to advance the stent delivery apparatus into the right internal carotid proximally. Advancement could only manage just distal to the angioplastied segment with no significant coverage of the angioplastied segment. Further attempts at advancing were met with resistance secondary to a step-off related to the calcified plaque. After multiple attempts it was decided to stop the procedure. Final control arteriogram performed through the balloon guide catheter in the right common carotid artery continued to demonstrate excellent flow through the right external carotid artery with a mild-to-moderate stenosis at its origin. The right internal carotid artery  proximally demonstrated approximately 60% patency which was maintained on subsequent 20 minute arteriogram. Centered intracranially demonstrated brisk flow into the right anterior cerebral artery and the right middle cerebral distribution. The previously occluded anterior branch of the inferior division proximally due to a calcified plaque remained stable. The delayed arterial phase demonstrated retrograde opacification of the posterior 2/3 of the cortical branches in the sylvian triangle from the branches of the inferior division and the superior division, and also from the pericallosal branches. The balloon guide catheter was removed. The 8 French Pinnacle sheath in the left common femoral artery was removed with manual compression held for approximately 25 minutes with a quick clot to achieved hemostasis at this site. Distal pulses remained palpable in the dorsalis pedis arteries bilaterally unchanged. An immediate CT of the brain demonstrated no evidence of hemorrhagic complications. The patient was then gradually extubated without difficulty. Patient was able to maintain his airway, and oxygen concentrations. He was able to move his right side spontaneously and to command. The patient was able to follow simple commands appropriately. No significant motor movement was evident in the left upper extremity, with only a flicker in the left foot toes. Patient was then transferred to the PACU and then subsequently the neuro ICU for post revascularization care. Later in the afternoon, the patient appeared more awake, alert and appropriately responsive. Had mild intermittent  right gaze deviation but was able to track his eyes to the left past the midline. Otherwise, neurologically he remained stable unchanged. 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  PLAN: Follow-up in the clinic 4 to 6 weeks post discharge. Electronically Signed   By: Luanne Bras M.D.   On: 01/03/2022 08:12   IR CT Head Ltd  Result Date: 01/03/2022 CLINICAL DATA:  Left-sided weakness with dysarthria. Patient with calcified embolus in the inferior division right MCA. Discovered to have high-grade stenosis of the extracranial right ICA proximally EXAM: IR ANGIO INTRA EXTRACRAN SEL COM CAROTID INNOMINATE UNI RIGHT MOD SED COMPARISON:  CT angiogram of the head and neck of Dec 30, 2021. MEDICATIONS: Heparin 3,000 units IV. Ancef 2 g IV antibiotic was administered within 1 hour of the procedure. ANESTHESIA/SEDATION: General anesthesia. CONTRAST:  Omnipaque 300 approximately 120 cc. FLUOROSCOPY TIME:  Fluoroscopy Time: 76 minutes 36) seconds (1257 mGy). COMPLICATIONS: None immediate. TECHNIQUE: Informed written consent was obtained from the patient after a thorough discussion of the procedural risks, benefits and alternatives. All questions were addressed. Maximal Sterile Barrier Technique was utilized including caps, mask, sterile gowns, sterile gloves, sterile drape, hand hygiene and skin antiseptic. A timeout was performed prior to the initiation of the procedure. The left groin was prepped and draped in the usual sterile fashion. Thereafter using modified Seldinger technique, transfemoral access into the left common femoral artery was obtained without difficulty. Over a 0.035 inch guidewire, an 8 French 25 cm Pinnacle sheath was inserted. Through this, and also over 0.035 inch guidewire, a 120 cm 6 French Simmons 2 catheter inside of an 087 95 cm balloon guide catheter combination was advanced to the aortic arch region and selectively positioned in the proximal right common carotid artery and the innominate artery. An arteriogram was then performed centered extra cranially and intracranially. FINDINGS: The innominate arteriogram demonstrates significant tortuosity of the innominate  artery in its entirety extending into the right common carotid artery with a 180 degree loop in the proximal right common carotid artery. Subclavian artery demonstrates patency with opacification of a hypoplastic right vertebral artery extra cranially. The right common carotid arteriogram demonstrates complex atherosclerotic calcified plaque at the right common carotid bifurcation extending into the bulb region of the right internal carotid artery resulting in a high-grade approximately 80-90% stenosis. More distally, the right internal carotid artery opacifies to the cranial skull base. Patency is seen of the petrous, cavernous and supraclinoid right ICA with a prominent right posterior communicating artery. Proximal portions of the right middle cerebral artery and the right anterior cerebral artery demonstrate patency. ENDOVASCULAR REVASCULARIZATION OF HIGH-GRADE STENOSIS OF THE RIGHT INTERNAL CAROTID ARTERY PROXIMALLY WITH BALLOON ANGIOPLASTY The balloon guide catheter was advanced into the proximal right common carotid artery. Through this, and also over a 0.014 inch Transend EX soft tip micro guidewire, an 021 150 cm microcatheter was then advanced with biplane DSA roadmap to the proximal cavernous right ICA followed by the microcatheter. The micro guidewire was removed. Good aspiration obtained from the hub of the microcatheter. A gentle control arteriogram performed through the microcatheter demonstrated safe positioning of the tip of the microcatheter. This in turn was then exchanged out for a 300 cm 014 inch BMW exchange wire with a moderate J configuration. The tip of the micro guidewire was positioned at the petrous cavernous junction. Measurements were then  performed of the right internal carotid artery distal to the stenosis, and just proximal to the stenosis of the distal common carotid artery. A 5 mm x 20 mm 014 inch Viatrac balloon guide catheter was then prepped and purged retrogradely with  heparinized saline infusion, and retrogradely with 50% contrast and 50% heparinized saline infusion. Using the rapid exchange technique, the balloon catheter was then advanced without difficulty and positioned with the distal and proximal markers adequate distant from the site of the severe stenosis in the proximal right internal carotid artery. Slow control inflation was then performed using micro inflation syringe device via micro tubing. Inflations were performed slowly to 9 atmospheres where it was maintained for approximately 60 seconds. Thereafter, the balloon was deflated and retrieved and removed. A control arteriogram performed through the balloon guide catheter in the proximal right common carotid artery demonstrated significantly improved caliber and flow through the angioplastied segment. More distally, free flow was noted into the distal right ICA intracranially. Over the exchange BMW micro guidewire, a 8 mm x 26 mm Wallstent stent delivery system which had been prepped with heparinized saline infusion was then advanced again using the rapid exchange technique to the proximal right common carotid artery. Resistance was encountered to further advancement of the stent delivery apparatus. At this time the balloon guide catheter was advanced to the distal right common carotid artery. Further attempts were made to advance the stent delivery apparatus into the right internal carotid proximally. Advancement could only manage just distal to the angioplastied segment with no significant coverage of the angioplastied segment. Further attempts at advancing were met with resistance secondary to a step-off related to the calcified plaque. After multiple attempts it was decided to stop the procedure. Final control arteriogram performed through the balloon guide catheter in the right common carotid artery continued to demonstrate excellent flow through the right external carotid artery with a mild-to-moderate stenosis  at its origin. The right internal carotid artery proximally demonstrated approximately 60% patency which was maintained on subsequent 20 minute arteriogram. Centered intracranially demonstrated brisk flow into the right anterior cerebral artery and the right middle cerebral distribution. The previously occluded anterior branch of the inferior division proximally due to a calcified plaque remained stable. The delayed arterial phase demonstrated retrograde opacification of the posterior 2/3 of the cortical branches in the sylvian triangle from the branches of the inferior division and the superior division, and also from the pericallosal branches. The balloon guide catheter was removed. The 8 French Pinnacle sheath in the left common femoral artery was removed with manual compression held for approximately 25 minutes with a quick clot to achieved hemostasis at this site. Distal pulses remained palpable in the dorsalis pedis arteries bilaterally unchanged. An immediate CT of the brain demonstrated no evidence of hemorrhagic complications. The patient was then gradually extubated without difficulty. Patient was able to maintain his airway, and oxygen concentrations. He was able to move his right side spontaneously and to command. The patient was able to follow simple commands appropriately. No significant motor movement was evident in the left upper extremity, with only a flicker in the left foot toes. Patient was then transferred to the PACU and then subsequently the neuro ICU for post revascularization care. Later in the afternoon, the patient appeared more awake, alert and appropriately responsive. Had mild intermittent right gaze deviation but was able to track his eyes to the left past the midline. Otherwise, neurologically he remained stable unchanged. IMPRESSION: Status post endovascular revascularization of symptomatic  high-grade stenosis of the proximal right ICA with a 5 mm x 20 mm Viatrac 14 balloon  angioplasty catheter achieving approximately 60% patency. PLAN: Follow-up in the clinic 4 to 6 weeks post discharge. Electronically Signed   By: Luanne Bras M.D.   On: 01/03/2022 08:12   IR PTA Intracranial  Result Date: 01/03/2022 CLINICAL DATA:  Left-sided weakness with dysarthria. Patient with calcified embolus in the inferior division right MCA. Discovered to have high-grade stenosis of the extracranial right ICA proximally EXAM: IR ANGIO INTRA EXTRACRAN SEL COM CAROTID INNOMINATE UNI RIGHT MOD SED COMPARISON:  CT angiogram of the head and neck of Dec 30, 2021. MEDICATIONS: Heparin 3,000 units IV. Ancef 2 g IV antibiotic was administered within 1 hour of the procedure. ANESTHESIA/SEDATION: General anesthesia. CONTRAST:  Omnipaque 300 approximately 120 cc. FLUOROSCOPY TIME:  Fluoroscopy Time: 76 minutes 36) seconds (1257 mGy). COMPLICATIONS: None immediate. TECHNIQUE: Informed written consent was obtained from the patient after a thorough discussion of the procedural risks, benefits and alternatives. All questions were addressed. Maximal Sterile Barrier Technique was utilized including caps, mask, sterile gowns, sterile gloves, sterile drape, hand hygiene and skin antiseptic. A timeout was performed prior to the initiation of the procedure. The left groin was prepped and draped in the usual sterile fashion. Thereafter using modified Seldinger technique, transfemoral access into the left common femoral artery was obtained without difficulty. Over a 0.035 inch guidewire, an 8 French 25 cm Pinnacle sheath was inserted. Through this, and also over 0.035 inch guidewire, a 120 cm 6 French Simmons 2 catheter inside of an 087 95 cm balloon guide catheter combination was advanced to the aortic arch region and selectively positioned in the proximal right common carotid artery and the innominate artery. An arteriogram was then performed centered extra cranially and intracranially. FINDINGS: The innominate  arteriogram demonstrates significant tortuosity of the innominate artery in its entirety extending into the right common carotid artery with a 180 degree loop in the proximal right common carotid artery. Subclavian artery demonstrates patency with opacification of a hypoplastic right vertebral artery extra cranially. The right common carotid arteriogram demonstrates complex atherosclerotic calcified plaque at the right common carotid bifurcation extending into the bulb region of the right internal carotid artery resulting in a high-grade approximately 80-90% stenosis. More distally, the right internal carotid artery opacifies to the cranial skull base. Patency is seen of the petrous, cavernous and supraclinoid right ICA with a prominent right posterior communicating artery. Proximal portions of the right middle cerebral artery and the right anterior cerebral artery demonstrate patency. ENDOVASCULAR REVASCULARIZATION OF HIGH-GRADE STENOSIS OF THE RIGHT INTERNAL CAROTID ARTERY PROXIMALLY WITH BALLOON ANGIOPLASTY The balloon guide catheter was advanced into the proximal right common carotid artery. Through this, and also over a 0.014 inch Transend EX soft tip micro guidewire, an 021 150 cm microcatheter was then advanced with biplane DSA roadmap to the proximal cavernous right ICA followed by the microcatheter. The micro guidewire was removed. Good aspiration obtained from the hub of the microcatheter. A gentle control arteriogram performed through the microcatheter demonstrated safe positioning of the tip of the microcatheter. This in turn was then exchanged out for a 300 cm 014 inch BMW exchange wire with a moderate J configuration. The tip of the micro guidewire was positioned at the petrous cavernous junction. Measurements were then performed of the right internal carotid artery distal to the stenosis, and just proximal to the stenosis of the distal common carotid artery. A 5 mm x 20 mm 014  inch Viatrac balloon  guide catheter was then prepped and purged retrogradely with heparinized saline infusion, and retrogradely with 50% contrast and 50% heparinized saline infusion. Using the rapid exchange technique, the balloon catheter was then advanced without difficulty and positioned with the distal and proximal markers adequate distant from the site of the severe stenosis in the proximal right internal carotid artery. Slow control inflation was then performed using micro inflation syringe device via micro tubing. Inflations were performed slowly to 9 atmospheres where it was maintained for approximately 60 seconds. Thereafter, the balloon was deflated and retrieved and removed. A control arteriogram performed through the balloon guide catheter in the proximal right common carotid artery demonstrated significantly improved caliber and flow through the angioplastied segment. More distally, free flow was noted into the distal right ICA intracranially. Over the exchange BMW micro guidewire, a 8 mm x 26 mm Wallstent stent delivery system which had been prepped with heparinized saline infusion was then advanced again using the rapid exchange technique to the proximal right common carotid artery. Resistance was encountered to further advancement of the stent delivery apparatus. At this time the balloon guide catheter was advanced to the distal right common carotid artery. Further attempts were made to advance the stent delivery apparatus into the right internal carotid proximally. Advancement could only manage just distal to the angioplastied segment with no significant coverage of the angioplastied segment. Further attempts at advancing were met with resistance secondary to a step-off related to the calcified plaque. After multiple attempts it was decided to stop the procedure. Final control arteriogram performed through the balloon guide catheter in the right common carotid artery continued to demonstrate excellent flow through the  right external carotid artery with a mild-to-moderate stenosis at its origin. The right internal carotid artery proximally demonstrated approximately 60% patency which was maintained on subsequent 20 minute arteriogram. Centered intracranially demonstrated brisk flow into the right anterior cerebral artery and the right middle cerebral distribution. The previously occluded anterior branch of the inferior division proximally due to a calcified plaque remained stable. The delayed arterial phase demonstrated retrograde opacification of the posterior 2/3 of the cortical branches in the sylvian triangle from the branches of the inferior division and the superior division, and also from the pericallosal branches. The balloon guide catheter was removed. The 8 French Pinnacle sheath in the left common femoral artery was removed with manual compression held for approximately 25 minutes with a quick clot to achieved hemostasis at this site. Distal pulses remained palpable in the dorsalis pedis arteries bilaterally unchanged. An immediate CT of the brain demonstrated no evidence of hemorrhagic complications. The patient was then gradually extubated without difficulty. Patient was able to maintain his airway, and oxygen concentrations. He was able to move his right side spontaneously and to command. The patient was able to follow simple commands appropriately. No significant motor movement was evident in the left upper extremity, with only a flicker in the left foot toes. Patient was then transferred to the PACU and then subsequently the neuro ICU for post revascularization care. Later in the afternoon, the patient appeared more awake, alert and appropriately responsive. Had mild intermittent right gaze deviation but was able to track his eyes to the left past the midline. Otherwise, neurologically he remained stable unchanged. IMPRESSION: Status post endovascular revascularization of symptomatic high-grade stenosis of the  proximal right ICA with a 5 mm x 20 mm Viatrac 14 balloon angioplasty catheter achieving approximately 60% patency. PLAN: Follow-up in the clinic 4  to 6 weeks post discharge. Electronically Signed   By: Luanne Bras M.D.   On: 01/03/2022 08:12   DG Swallowing Func-Speech Pathology  Result Date: 01/02/2022 Table formatting from the original result was not included. Objective Swallowing Evaluation: Type of Study: MBS-Modified Barium Swallow Study  Patient Details Name: Chad [name] MRN: [id number] Date of Birth: March 18, 1932 Today's Date: 01/02/2022 Time: SLP Start Time (ACUTE ONLY): 1100 -SLP Stop Time (ACUTE ONLY): 1123 SLP Time Calculation (min) (ACUTE ONLY): 23 min Past Medical History: Past Medical History: Diagnosis Date  Arthritis   Heart murmur   asa child   Hypertension   Stroke [facility])  Past Surgical History: Past Surgical History: Procedure Laterality Date  EYE SURGERY Bilateral 2022  JOINT REPLACEMENT    RADIOLOGY WITH ANESTHESIA N/A 01/01/2022  Procedure: Angiogram;  Surgeon: Luanne Bras, MD;  Location: [facility];  Service: Radiology;  Laterality: N/A;  TONSILLECTOMY    TOTAL KNEE ARTHROPLASTY Right 03/30/2013  Procedure: RIGHT TOTAL KNEE ARTHROPLASTY;  Surgeon: Tobi Bastos, MD;  Location: [facility];  Service: Orthopedics;  Laterality: Right;  TOTAL KNEE ARTHROPLASTY Left 10/09/2020  Procedure: TOTAL KNEE ARTHROPLASTY;  Surgeon: Paralee Cancel, MD;  Location: [facility];  Service: Orthopedics;  Laterality: Left;  70 mins HPI: 88 yo male presenting to the ED on 5/28 with L sided weakness, facial droop, slurred speech, and fall. 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 PMH including hypothyroidism chronic pain syndromes, CKD, HTN, arthritis, bil TKA.  Subjective: alert, friendly  Recommendations for follow up therapy are one component of a multi-disciplinary discharge planning process, led by the attending physician.  Recommendations may be  updated based on patient status, additional functional criteria and insurance authorization. Assessment / Plan / Recommendation   01/02/2022  11:00 AM Clinical Impressions Clinical Impression Pt presents with oropharyngeal dysphagia characterized by reduced labial seal, impaired mastication, a pharyngeal delay and reduction in bolus cohesion, posterior bolus propulsion, tongue base retraction, anterior laryngeal movement, and pharyngeal constriction. He demonstrated left-sided anterior spillage, lingual residue, difficulty with A-P transport of solids, premature spillage to the valleculae and pyriform sinuses, and posterior pharyngeal wall residue. A liquid bolus was necessary to facilitate mastication and A-P transport of a nutrigrain bar. Liquid washes reduced pharyngeal residue to a more functional level and a left head turn was effective in reducing pyriform sinus residue with thin liquids, but pt exhibited notable difficulty maintaining this position. Penetration (PAS 3, 5) and aspiration (PAS 7, 8) were noted with thin liquids. Larger amounts of aspirate did trigger a cough which was mostly effective, but smaller quantities inconsistently triggered throat clearing which was inadequate for expulsion of material. No functional benefit was noted with prompted coughing. A dysphagia 1 diet with nectar thick liquids is recommended at this time. SLP will follow for dysphagia treatment. SLP Visit Diagnosis Dysphagia, oropharyngeal phase (R13.12) Impact on safety and function Mild aspiration risk     01/02/2022  11:00 AM Treatment Recommendations Treatment Recommendations Therapy as outlined in treatment plan below     01/02/2022  11:00 AM Prognosis Prognosis for Safe Diet Advancement Good   01/02/2022  11:00 AM Diet Recommendations SLP Diet Recommendations Dysphagia 1 (Puree) solids;Nectar thick liquid Liquid Administration via Cup;Straw Medication Administration Whole meds with puree Compensations Slow rate;Small  sips/bites;Monitor for anterior loss;Follow solids with liquid Postural Changes Seated upright at 90 degrees     01/02/2022  11:00 AM Other Recommendations Other Recommendations Order thickener from pharmacy Follow  Up Recommendations Acute inpatient rehab (3hours/day) Assistance recommended at discharge Frequent or constant Supervision/Assistance Functional Status Assessment Patient has had a recent decline in their functional status and demonstrates the ability to make significant improvements in function in a reasonable and predictable amount of time.   01/02/2022  11:00 AM Frequency and Duration  Speech Therapy Frequency (ACUTE ONLY) min 2x/week Treatment Duration 2 weeks     01/02/2022  11:00 AM Oral Phase Oral Phase Impaired Oral - Nectar Cup Left anterior bolus loss;Reduced posterior propulsion;Lingual/palatal residue;Delayed oral transit;Decreased bolus cohesion;Premature spillage Oral - Nectar Straw Left anterior bolus loss;Reduced posterior propulsion;Lingual/palatal residue;Delayed oral transit;Decreased bolus cohesion;Premature spillage Oral - Thin Cup Left anterior bolus loss;Reduced posterior propulsion;Lingual/palatal residue;Delayed oral transit;Decreased bolus cohesion;Premature spillage Oral - Thin Straw Left anterior bolus loss;Reduced posterior propulsion;Lingual/palatal residue;Delayed oral transit;Decreased bolus cohesion;Premature spillage Oral - Puree Left anterior bolus loss;Reduced posterior propulsion;Lingual/palatal residue;Delayed oral transit;Decreased bolus cohesion;Weak lingual manipulation Oral - Mech Soft Left anterior bolus loss;Reduced posterior propulsion;Lingual/palatal residue;Delayed oral transit;Decreased bolus cohesion;Weak lingual manipulation;Impaired mastication Oral - Pill Left anterior bolus loss;Reduced posterior propulsion;Lingual/palatal residue;Delayed oral transit;Decreased bolus cohesion    01/02/2022  11:00 AM Pharyngeal Phase Pharyngeal Phase Impaired Pharyngeal- Thin  Cup Reduced tongue base retraction;Reduced anterior laryngeal mobility;Delayed swallow initiation-pyriform sinuses;Reduced pharyngeal peristalsis;Penetration/Aspiration during swallow;Penetration/Apiration after swallow;Pharyngeal residue - valleculae;Pharyngeal residue - pyriform;Pharyngeal residue - posterior pharnyx Pharyngeal Material enters airway, remains ABOVE vocal cords and not ejected out;Material enters airway, CONTACTS cords and not ejected out;Material enters airway, passes BELOW cords and not ejected out despite cough attempt by patient;Material enters airway, passes BELOW cords without attempt by patient to eject out (silent aspiration) Pharyngeal- Thin Straw Reduced tongue base retraction;Reduced anterior laryngeal mobility;Delayed swallow initiation-pyriform sinuses;Reduced pharyngeal peristalsis;Penetration/Aspiration during swallow;Penetration/Apiration after swallow;Pharyngeal residue - valleculae;Pharyngeal residue - pyriform;Pharyngeal residue - posterior pharnyx Pharyngeal Material enters airway, passes BELOW cords and not ejected out despite cough attempt by patient;Material enters airway, passes BELOW cords without attempt by patient to eject out (silent aspiration);Material enters airway, CONTACTS cords and then ejected out Pharyngeal- Puree Reduced tongue base retraction;Reduced anterior laryngeal mobility;Delayed swallow initiation-pyriform sinuses;Reduced pharyngeal peristalsis;Pharyngeal residue - valleculae;Pharyngeal residue - pyriform;Pharyngeal residue - posterior pharnyx Pharyngeal- Mechanical Soft Reduced tongue base retraction;Reduced anterior laryngeal mobility;Delayed swallow initiation-pyriform sinuses;Reduced pharyngeal peristalsis;Pharyngeal residue - valleculae;Pharyngeal residue - pyriform;Pharyngeal residue - posterior pharnyx Pharyngeal- Pill Reduced tongue base retraction;Reduced anterior laryngeal mobility;Delayed swallow initiation-pyriform sinuses;Reduced pharyngeal  peristalsis;Pharyngeal residue - valleculae;Pharyngeal residue - pyriform;Pharyngeal residue - posterior pharnyx    01/02/2022  11:00 AM Cervical Esophageal Phase  Cervical Esophageal Phase [facility] Shanika I. Hardin Negus, [place], [place] Office number [phone] Pager [phone] Horton Marshall 01/02/2022, 12:20 PM                     VAS US CAROTID  Result Date: 12/31/2021 Carotid Arterial Duplex Study Patient Name:  Chad [name]  Date of Exam:   12/30/2021 Medical Rec #: [id number]          Accession #:    [id number] Date of Birth: [birth year]           Patient Gender: M Patient Age:   42 years Exam Location:  [facility] Procedure:      VAS US CAROTID Referring Phys: Nyoka Lint DOUTOVA --------------------------------------------------------------------------------  Indications:       CVA. Risk Factors:      Hypertension. Comparison Study:  No prior study Performing Technologist: Maudry Mayhew MHA, RDMS, RVT, RDCS  Examination Guidelines: A complete evaluation includes  B-mode imaging, spectral Doppler, color Doppler, and power Doppler as needed of all accessible portions of each vessel. Bilateral testing is considered an integral part of a complete examination. Limited examinations for reoccurring indications may be performed as noted.  Right Carotid Findings: +----------+-------+--------+--------+-------------------------------+---------+           PSV    EDV cm/sStenosisPlaque Description             Comments            cm/s                                                            +----------+-------+--------+--------+-------------------------------+---------+ CCA Prox  54     7               heterogenous and irregular               +----------+-------+--------+--------+-------------------------------+---------+ CCA Distal58     9               smooth and heterogenous                   +----------+-------+--------+--------+-------------------------------+---------+ ICA Prox  132    19              smooth, heterogenous and       Shadowing                                  calcific                                 +----------+-------+--------+--------+-------------------------------+---------+ ICA Distal78     15                                                       +----------+-------+--------+--------+-------------------------------+---------+ ECA       126                                                             +----------+-------+--------+--------+-------------------------------+---------+ +----------+--------+-------+----------------+-------------------+           PSV cm/sEDV cmsDescribe        Arm Pressure (mmHG) +----------+--------+-------+----------------+-------------------+ [id number]            Multiphasic, WNL                    +----------+--------+-------+----------------+-------------------+ +---------+--------+--+--------+-+---------+ VertebralPSV cm/s51EDV cm/s5Antegrade +---------+--------+--+--------+-+---------+  Left Carotid Findings: +----------+--------+--------+--------+------------------------------+---------+           PSV cm/sEDV cm/sStenosisPlaque Description            Comments  +----------+--------+--------+--------+------------------------------+---------+ CCA Prox  89                      heterogenous and irregular    tortuous  +----------+--------+--------+--------+------------------------------+---------+ CCA Distal70      8                                                       +----------+--------+--------+--------+------------------------------+---------+  ICA Prox  49      12              heterogenous, irregular and   Shadowing                                   calcific                                 +----------+--------+--------+--------+------------------------------+---------+ ICA Distal98      18                                                      +----------+--------+--------+--------+------------------------------+---------+ ECA       67      10                                                      +----------+--------+--------+--------+------------------------------+---------+ +----------+--------+--------+----------------+-------------------+           PSV cm/sEDV cm/sDescribe        Arm Pressure (mmHG) +----------+--------+--------+----------------+-------------------+ Subclavian184             Multiphasic, WNL                    +----------+--------+--------+----------------+-------------------+ +---------+--------+--+--------+--+---------+ VertebralPSV cm/s94EDV cm/s14Antegrade +---------+--------+--+--------+--+---------+   Summary: Right Carotid: Velocities in the right ICA are consistent with a 1-39% stenosis. Left Carotid: Velocities in the left ICA are consistent with a 1-39% stenosis. Vertebrals:  Bilateral vertebral arteries demonstrate antegrade flow. Subclavians: Normal flow hemodynamics were seen in bilateral subclavian              arteries. *See table(s) above for measurements and observations.  Electronically signed by Ernisha Sorn MD on 12/31/2021 at 3:59:42 PM.    Final    ECHOCARDIOGRAM COMPLETE  Result Date: 12/30/2021    ECHOCARDIOGRAM REPORT   Patient Name:   Chad [name] Date of Exam: 12/30/2021 Medical Rec #:  [id number]         Height:       66.0 in Accession #:    [id number]        Weight:       133.6 lb Date of Birth:  12/13/1931          BSA:          1.685 m Patient Age:    89 years          BP:           161/84 mmHg Patient Gender: M                 HR:           66 bpm. Exam Location:  Inpatient Procedure: 2D Echo Indications:    stroke  History:        Patient has prior history of Echocardiogram examinations, most                  recent 04/25/2020. Chronic kidney disease.; Risk                   Factors:Hypertension and Dyslipidemia.  Sonographer:    Johny Chess RDCS Referring Phys: [facility]  1. Left ventricular ejection fraction, by estimation, is 55 to 60%. The left ventricle has normal function. The left ventricle has no regional wall motion abnormalities. There is mild left ventricular hypertrophy. Left ventricular diastolic parameters are consistent with Grade I diastolic dysfunction (impaired relaxation).  2. Right ventricular systolic function is normal. The right ventricular size is normal.  3. Left atrial size was mildly dilated.  4. The mitral valve is normal in structure. Mild mitral valve regurgitation. No evidence of mitral stenosis.  5. The aortic valve is tricuspid. Aortic valve regurgitation is mild. Aortic valve sclerosis/calcification is present, without any evidence of aortic stenosis.  6. The inferior vena cava is normal in size with greater than 50% respiratory variability, suggesting right atrial pressure of 3 mmHg. FINDINGS  Left Ventricle: Left ventricular ejection fraction, by estimation, is 55 to 60%. The left ventricle has normal function. The left ventricle has no regional wall motion abnormalities. The left ventricular internal cavity size was normal in size. There is  mild left ventricular hypertrophy. Left ventricular diastolic parameters are consistent with Grade I diastolic dysfunction (impaired relaxation). Right Ventricle: The right ventricular size is normal. Right ventricular systolic function is normal. Left Atrium: Left atrial size was mildly dilated. Right Atrium: Right atrial size was normal in size. Pericardium: Trivial pericardial effusion is present. Mitral Valve: The mitral valve is normal in structure. Mild mitral annular calcification. Mild mitral valve regurgitation. No evidence of mitral valve stenosis. Tricuspid Valve: The tricuspid valve is normal in structure.  Tricuspid valve regurgitation is trivial. No evidence of tricuspid stenosis. Aortic Valve: The aortic valve is tricuspid. Aortic valve regurgitation is mild. Aortic regurgitation PHT measures 400 msec. Aortic valve sclerosis/calcification is present, without any evidence of aortic stenosis. Pulmonic Valve: The pulmonic valve was normal in structure. Pulmonic valve regurgitation is trivial. No evidence of pulmonic stenosis. Aorta: The aortic root is normal in size and structure. Venous: The inferior vena cava is normal in size with greater than 50% respiratory variability, suggesting right atrial pressure of 3 mmHg. IAS/Shunts: No atrial level shunt detected by color flow Doppler.  LEFT VENTRICLE PLAX 2D LVIDd:         5.20 cm   Diastology LVIDs:         3.10 cm   LV e' medial:    4.50 cm/s LV PW:         1.20 cm   LV E/e' medial:  12.0 LV IVS:        1.10 cm   LV e' lateral:   4.20 cm/s LVOT diam:     2.10 cm   LV E/e' lateral: 12.9 LV SV:         80 LV SV Index:   47 LVOT Area:     3.46 cm  RIGHT VENTRICLE             IVC RV S prime:     15.70 cm/s  IVC diam: 1.00 cm TAPSE (M-mode): 2.0 cm LEFT ATRIUM             Index        RIGHT ATRIUM           Index LA diam:        3.40 cm 2.02 cm/m   RA Area:     16.30 cm LA Vol (A2C):   79.2 ml 47.01 ml/m  RA Volume:  39.80 ml  23.62 ml/m LA Vol (A4C):   47.5 ml 28.20 ml/m LA Biplane Vol: 63.0 ml 37.40 ml/m  AORTIC VALVE LVOT Vmax:   102.00 cm/s LVOT Vmean:  67.600 cm/s LVOT VTI:    0.230 m AI PHT:      400 msec  AORTA Ao Root diam: 3.70 cm Ao Asc diam:  3.10 cm MITRAL VALVE MV Area (PHT): 4.60 cm    SHUNTS MV Decel Time: 165 msec    Systemic VTI:  0.23 m MR Peak grad: 134.6 mmHg   Systemic Diam: 2.10 cm MR Mean grad: 82.0 mmHg MR Vmax:      580.00 cm/s MR Vmean:     416.0 cm/s MV E velocity: 54.00 cm/s MV A velocity: 96.40 cm/s MV E/A ratio:  0.56 Brian Crenshaw MD Electronically signed by Brian Crenshaw MD Signature Date/Time: 12/30/2021/2:40:23 PM    Final    CT  ANGIO HEAD NECK W WO CM  Result Date: 12/30/2021 CLINICAL DATA:  Stroke/TIA, determine embolic source EXAM: CT ANGIOGRAPHY HEAD AND NECK TECHNIQUE: Multidetector CT imaging of the head and neck was performed using the standard protocol during bolus administration of intravenous contrast. Multiplanar CT image reconstructions and MIPs were obtained to evaluate the vascular anatomy. Carotid stenosis measurements (when applicable) are obtained utilizing NASCET criteria, using the distal internal carotid diameter as the denominator. RADIATION DOSE REDUCTION: This exam was performed according to the departmental dose-optimization program which includes automated exposure control, adjustment of the mA and/or kV according to patient size and/or use of iterative reconstruction technique. CONTRAST:  80mL OMNIPAQUE IOHEXOL 350 MG/ML SOLN COMPARISON:  Same day MRI/MRA. FINDINGS: CT HEAD FINDINGS Brain: Small right MCA territory infarcts better characterized on same day MRI. No evidence of acute hemorrhage, mass effect, midline shift, extra-axial fluid collection, or hydrocephalus. Patchy white matter hypodensities, nonspecific but compatible with chronic microvascular ischemic disease. Vascular: Detailed below. Skull: No acute fracture. Sinuses: Mild paranasal sinus mucosal thickening. Orbits: No acute orbital findings. Review of the MIP images confirms the above findings CTA NECK FINDINGS Aortic arch: Calcific atherosclerosis of the aorta and great vessel origins. Ectatic right subclavian artery. Right carotid system: Atherosclerosis at the carotid bifurcation with approximately 80% stenosis. Left carotid system: Atherosclerosis at the carotid bifurcation without greater than 50% stenosis. Vertebral arteries: Potentially severe stenosis of the right vertebral artery origin due to calcific atherosclerosis. Otherwise, vertebral arteries are patent without significant (greater than 50%) stenosis. Left dominant. Skeleton: Severe  multilevel degenerative change. Other neck: No acute findings. Upper chest: Clear sinuses. Review of the MIP images confirms the above findings CTA HEAD FINDINGS Anterior circulation: Bilateral intracranial ICAs are patent with mild narrowing due to calcific atherosclerosis. Bilateral M1 MCAs and left M2 MCAs are patent. Apparent occlusion of a right M2 MCA branch in a region of calcification. Bilateral ACAs are patent. Posterior circulation: Left dominant intradural vertebral artery. Right intradural vertebral artery makes very small contribution to the basilar artery. Basilar artery and bilateral posterior cerebral arteries are patent without proximal hemodynamically significant stenosis. Venous sinuses: As permitted by contrast timing, patent. Anatomic variants: Detailed above. Review of the MIP images confirms the above findings IMPRESSION: CTA: 1. 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 2. Extensive calcific atherosclerosis at the right carotid bifurcation with approximately 80% stenosis of the proximal ICA. 3. Potentially severe stenosis of the non dominant right vertebral   artery origin. CT head: Small right MCA territory infarcts better characterized on same day MRI. No evidence of progressive mass effect or acute hemorrhage. Findings discussed with Dr. Xu via telephone at 12:50 p.m. Electronically Signed   By: Ahmari S Jones M.D.   On: 12/30/2021 13:00   MR BRAIN WO CONTRAST  Result Date: 12/30/2021 CLINICAL DATA:  There are deficit with acute stroke suspected EXAM: MRI HEAD WITHOUT CONTRAST MRA HEAD WITHOUT CONTRAST TECHNIQUE: Multiplanar, multi-echo pulse sequences of the brain and surrounding structures were acquired without intravenous contrast. Angiographic images of the Circle of Willis were acquired using MRA technique without  intravenous contrast. COMPARISON:  Head CT from yesterday FINDINGS: MRI HEAD FINDINGS Brain: Cluster of small acute infarcts in the right insular cortex, right frontal cortex, and deep right cerebral white matter. These are in a MCA distribution. Background of advanced chronic small vessel ischemia with confluent gliosis in the hemispheric white matter. Chronic lacunar infarcts in the deep cerebrum. Small remote left cerebral infarct. Gradient signal at the lower right sylvian fissure with there is calcification along the MCA branches by CT. Vascular: Major flow voids are preserved Skull and upper cervical spine: Normal marrow signal Sinuses/Orbits: Bilateral cataract resection MRA HEAD FINDINGS Anterior circulation: High-grade narrowing involving the upper right M2 branch with there is a gradient hypointensity noted above. Elsewhere vessels are smoothly contoured and widely patent. Posterior circulation: Vertebrobasilar arteries are smoothly contoured and widely patent. IMPRESSION: Brain MRI: 1. Cluster of small cortical and white matter infarcts within the right MCA distribution. 2. Background of advanced chronic small vessel ischemia. Intracranial MRA: Focal high-grade narrowing at the right M2 level where there is calcification by CT. This could reflect calcified plaque or a calcified embolus. If an embolus, the proximal nature and degree of mild infarct with suggest additional territory of risk. Electronically Signed   By: Jonathan  Watts M.D.   On: 12/30/2021 07:53   MR ANGIO HEAD WO CONTRAST  Result Date: 12/30/2021 CLINICAL DATA:  There are deficit with acute stroke suspected EXAM: MRI HEAD WITHOUT CONTRAST MRA HEAD WITHOUT CONTRAST TECHNIQUE: Multiplanar, multi-echo pulse sequences of the brain and surrounding structures were acquired without intravenous contrast. Angiographic images of the Circle of Willis were acquired using MRA technique without intravenous contrast. COMPARISON:  Head CT from  yesterday FINDINGS: MRI HEAD FINDINGS Brain: Cluster of small acute infarcts in the right insular cortex, right frontal cortex, and deep right cerebral white matter. These are in a MCA distribution. Background of advanced chronic small vessel ischemia with confluent gliosis in the hemispheric white matter. Chronic lacunar infarcts in the deep cerebrum. Small remote left cerebral infarct. Gradient signal at the lower right sylvian fissure with there is calcification along the MCA branches by CT. Vascular: Major flow voids are preserved Skull and upper cervical spine: Normal marrow signal Sinuses/Orbits: Bilateral cataract resection MRA HEAD FINDINGS Anterior circulation: High-grade narrowing involving the upper right M2 branch with there is a gradient hypointensity noted above. Elsewhere vessels are smoothly contoured and widely patent. Posterior circulation: Vertebrobasilar arteries are smoothly contoured and widely patent. IMPRESSION: Brain MRI: 1. Cluster of small cortical and white matter infarcts within the right MCA distribution. 2. Background of advanced chronic small vessel ischemia. Intracranial MRA: Focal high-grade narrowing at the right M2 level where there is calcification by CT. This could reflect calcified plaque or a calcified embolus. If an embolus, the proximal nature and degree of mild infarct with suggest additional territory of risk. Electronically Signed   By:   Jonathan  Watts M.D.   On: 12/30/2021 07:53   DG Chest Portable 1 View  Result Date: 12/29/2021 CLINICAL DATA:  CVA. Fell last night. Family concerned patient injured left side EXAM: PORTABLE CHEST 1 VIEW COMPARISON:  Chest two views 03/23/2013 FINDINGS: Cardiac silhouette is mildly enlarged. Moderate calcifications within the aortic arch and descending thoracic aorta. Minimal bibasilar horizontal linear chronic scarring is unchanged. No focal airspace opacity to indicate pneumonia. No pleural effusion or pneumothorax. Moderate  multilevel degenerative disc changes of the thoracic spine. IMPRESSION: Mild stable cardiomegaly. No acute lung process. Electronically Signed   By: Ronald  Viola M.D.   On: 12/29/2021 19:19   DG Hand Complete Left  Result Date: 12/29/2021 CLINICAL DATA:  Fall.  Bruising to posterior left hand. EXAM: LEFT HAND - COMPLETE 3+ VIEW COMPARISON:  None Available. FINDINGS: There is diffuse decreased bone mineralization. A pulse oximeter overlies the distal index finger obscuring portions. Degenerative changes including joint space narrowing, subchondral sclerosis and peripheral osteophytosis are moderate at the triscaphe joint; moderate to severe at the thumb carpometacarpal, thumb metacarpophalangeal, and thumb interphalangeal joints; and moderate to severe at the DIP joints and moderate at the PIP joints of the second through fifth fingers. No acute fracture is seen. No dislocation. IMPRESSION: Osteoarthritis as above. No acute fracture. Electronically Signed   By: Ronald  Viola M.D.   On: 12/29/2021 18:12   DG Elbow Complete Left  Result Date: 12/29/2021 CLINICAL DATA:  Fall.  Left elbow pain and bruising. EXAM: LEFT ELBOW - COMPLETE 3+ VIEW COMPARISON:  None Available. FINDINGS: No fracture or dislocation. Narrowed radiocapitellar joint. Marginal osteophytes project from the base of the radial head. No joint effusion. There is soft tissue edema posteriorly. IMPRESSION: No fracture or dislocation. Electronically Signed   By: David  Ormond M.D.   On: 12/29/2021 18:12   CT Cervical Spine Wo Contrast  Result Date: 12/29/2021 CLINICAL DATA:  There logic deaf sick, neck trauma EXAM: CT CERVICAL SPINE WITHOUT CONTRAST TECHNIQUE: Multidetector CT imaging of the cervical spine was performed without intravenous contrast. Multiplanar CT image reconstructions were also generated. RADIATION DOSE REDUCTION: This exam was performed according to the departmental dose-optimization program which includes automated exposure  control, adjustment of the mA and/or kV according to patient size and/or use of iterative reconstruction technique. COMPARISON:  None Available. FINDINGS: Alignment: Mild degenerative anterolisthesis of C2 and C3. Otherwise alignment is anatomic. Skull base and vertebrae: No acute fracture. No primary bone lesion or focal pathologic process. Soft tissues and spinal canal: No prevertebral fluid or swelling. No visible canal hematoma. Disc levels: Partial bony fusion across the disc spaces at C3-4 and C4-5. There is severe multilevel spondylosis throughout the remainder of the cervical spine, greatest at C5-6 and C6-7. Diffuse facet hypertrophy. Upper chest: Airway is patent. Lung apices are clear. Prominent atherosclerosis of the aortic arch. Other: Reconstructed images demonstrate no additional findings. IMPRESSION: 1. Extensive multilevel cervical degenerative changes. No acute fracture. Electronically Signed   By: Michael  Brown M.D.   On: 12/29/2021 17:38   CT HEAD WO CONTRAST  Result Date: 12/29/2021 CLINICAL DATA:  Neurologic deficit EXAM: CT HEAD WITHOUT CONTRAST TECHNIQUE: Contiguous axial images were obtained from the base of the skull through the vertex without intravenous contrast. RADIATION DOSE REDUCTION: This exam was performed according to the departmental dose-optimization program which includes automated exposure control, adjustment of the mA and/or kV according to patient size and/or use of iterative reconstruction technique. COMPARISON:  None Available. FINDINGS: Brain: Confluent   hypodensities throughout the periventricular white matter are most consistent with age-indeterminate small vessel ischemic changes, likely chronic. Focal hypodensities in the left basal ganglia and right frontal periventricular white matter consistent with chronic lacunar infarcts. Age-indeterminate lacunar infarct is seen within the right basal ganglia image 18/3. No other signs of acute infarct or hemorrhage. Lateral  ventricles and midline structures are otherwise unremarkable. No acute extra-axial fluid collections. No mass effect. Vascular: No hyperdense vessel or unexpected calcification. Skull: Normal. Negative for fracture or focal lesion. Sinuses/Orbits: No acute finding. Other: None. IMPRESSION: 1. Age indeterminate lacunar infarct within the right basal ganglia. 2. Age-indeterminate small-vessel ischemic changes throughout the periventricular white matter, favor chronic. 3. Chronic left basal ganglia and right frontal white matter lacunar infarcts as above. 4. No acute hemorrhage. Electronically Signed   By: Randa Ngo M.D.   On: 12/29/2021 17:35     PHYSICAL EXAM  Temp:  [98.2 F (36.8 C)-99 F (37.2 C)] 98.3 F (36.8 C) (06/17 1119) Pulse Rate:  [65-190] 66 (06/17 1056) Resp:  [9-36] 17 (06/17 1056) BP: (88-192)/(48-156) 162/69 (06/17 1056) SpO2:  [95 %-100 %] 100 % (06/17 1056) FiO2 (%):  [30 %] 30 % (06/17 1056) Weight:  [66.8 kg] 66.8 kg (06/17 0500)  General - Well nourished, well developed, intubated on sedation.  Ophthalmologic - fundi not visualized due to noncooperation.  Cardiovascular - Regular rate and rhythm.  Neuro - intubated on sedation, eyes closed but minimally open to voice, not following commands. With forced eye opening, eyes in mid position, not blinking to visual threat, doll's eyes present, not tracking, pinpoint pupil bilaterally.  Sluggishly reactive.  Corneal reflex weak on the left, absent on the right, gag and cough present. Breathing over the vent.  Facial symmetry not able to test due to ET tube.  Tongue protrusion not cooperative. On pain stimulation, right upper extremity  mild flexion response, right lower extremity mild withdraw, left upper extremity flaccid, left lower extremity flicker toe movement. DTR diminished and no babinski. Sensation, coordination and gait not tested.    ASSESSMENT/PLAN Chad [name] is a 88 y.o. male with history of  hypertension, hyperlipidemia, hearing loss, recent stroke status post right carotid angioplasty transfer back from CIR due to aspiration, respite failure.  Found to have severe anemia with GI bleeding, renal failure and seizure-like activity.  Stroke:  right MCA infarct extended from recent infarct, likely secondary to hypertension in the setting of sepsis and GI bleeding with known right ICA stenosis s/p angioplasty  Recent stroke: right MCA scattered small infarcts due to right M2 occlusion by calcified plaque,  secondary to large vessel disease source from right ICA high-grade stenosis CT 5/28 no acute abnormality, old bilateral BG, right frontal white matter lacunar infarcts. MRA right M 2 high-grade stenosis MRI 5/29 right MCA scattered small infarcts CT head and neck 5/29 proximal right M2 occlusion due to calcified embolus. Extensive calcified atherosclerosis at right ICA bifurcation with 80% stenosis.  Severe stenosis nondominant right VA origin.  CT head 6/15 - moderate-sized acute/subacute cortical and subcortical infarct within the right frontoparietal lobes and right insula  MRI pending to further evaluate once pt BP stable and LTM off Consider to repeat CTA head and neck to evaluate right ICA s/p angioplasty Carotid Doppler unremarkable 2D Echo EF 55 to 60% LDL 90 HgbA1c 6.1 aspirin 81 mg daily and Brilinta (ticagrelor) 90 mg bid prior to admission, now on aspirin 81 mg daily and Brilinta (ticagrelor) 90 mg bid. If  concerning of bleeding risk, can do ASA and plavix. Discussed with Dr. Lake Bells, will close monitoring bleeding before make regimen change. Ongoing aggressive stroke risk factor management Therapy recommendations:  pending Disposition: Pending  Seizure like activity 6/15 episode of left gaze, right upper extremity posturing, status post Ativan EEG stat no seizure, moderate to severe diffuse encephalopathy On LTM Once off LTM, can consider MRI  Respiratory  failure Aspiration pneumonia on Unasyn CCM on board Intubated for airway protection Vent management per CCM  Carotid stenosis CT head and neck 5/29 extensive calcified atherosclerosis at right ICA bifurcation with 80% stenosis. Likely the source for right M2 occlusion and right MCA stroke Dr. Estanislado Pandy did right carotid angioplasty 5/31, not able to have stent placement due to tortuosity of the vessel Plan to follow up with Dr. Estanislado Pandy in 1-2 months, if re-stenosis, may need to consider right CEA Consider to repeat CTA head and neck to evaluate the right ICA status post angioplasty was stable   GIB Large dark stool Severe anemia Hb 8.2->8.0->5.6->PRBC->9.9-> 9.5 GI on board EGD 6/16 found food in stomach and no significance and source of bleeding Close monitoring  History of hypertension Hypotension Likely due to septic shock and severe anemia On IV fluid, received PRBC transfusion Still on Levophed BP goal normotensive   Hyperlipidemia Home meds: Lovastatin 40 LDL 90, goal < 70 Now on Crestor 20 Continue statin at discharge   AKI on CKD Creatinine 1.3->3.84->3.24->3.12 CCM on board On IVF Avoid nephrotoxicity agents  Dysphagia On tube feeding now  Other Stroke Risk Factors Advanced age   Other Active Problems Hard of hearing  Hospital day # 3 Recommend discontinue long-term EEG monitoring as has not had any overt clinical seizures.  Wean off  ventilatory support] as per pulmonary critical care team..  Discussed with Dr. Lake Bells critical care medicine.  May consider repeat MRI and CT angiogram when stable. This patient is critically ill due to stroke extension, right carotid stenosis, GI bleeding with severe anemia, AKI on CKD, seizure-like activity, respiratory failure and at significant risk of neurological worsening, death form sepsis, septic shock, renal failure, recurrent stroke, hemorrhagic transformation, status epilepticus. This patient's care requires  constant monitoring of vital signs, hemodynamics, respiratory and cardiac monitoring, review of multiple databases, neurological assessment, discussion with family, other specialists and medical decision making of high complexity. I spent 32 minutes of neurocritical care time in the care of this patient.  I discussed with Dr. Lake Bells CCM.  Antony Contras, MD Stroke Neurology 01/18/2022 12:51 PM    To contact Stroke Continuity provider, please refer to [email or web address] After hours, contact General Neurology

## 2022-01-18 NOTE — Progress Notes (Signed)
LTM EEG discontinued - no skin breakdown at unhook.   

## 2022-01-18 NOTE — Progress Notes (Addendum)
eLink Physician-Brief Progress Note Patient Name: Chad [name] DOB: 10-09-1931 MRN: [id number]   Date of Service  01/18/2022  HPI/Events of Note  Patient attempted to self-extubate.  eICU Interventions  Precedex ceiling increased to 1.0 mcg and bilateral soft restraints ordered,  PRN Fentanyl interval reduced to Q 1 hour. PRN Hydralazine dose increased to 10-20 mg.        [name] Jarick Harkins 01/18/2022, 3:38 AM

## 2022-01-18 NOTE — Progress Notes (Addendum)
eLink Physician-Brief Progress Note Patient Name: Chad [name] DOB: [birth year] MRN: [id number]   Date of Service  01/18/2022  HPI/Events of Note  Patient agitated and dyssynchronous on the ventilator, he is not on any sedation except for Q 2 hours PRN iv Fentanyl. BP 176/89.  eICU Interventions  Low dose Precedex gtt ordered. PRN iv Hydralazine ordered for SBP > 160 mmHg AFTER Precedex gtt is started.        [name] [name] 01/18/2022, 12:15 AM

## 2022-01-18 NOTE — Plan of Care (Signed)

## 2022-01-18 NOTE — Progress Notes (Signed)
LTM maint complete - no skin breakdown under: Fp1 Fp2 redness F8 lead adjusted Serviced P7 P3 T3 T8 [facility] monitored, Event button test confirmed by [facility].

## 2022-01-18 NOTE — Procedures (Signed)
Patient Name: Chad [name]  MRN: [id number]  Epilepsy Attending: Lora Havens  Referring Physician/Provider: Gwinda Maine, MD  Duration: 01/11/2022 1240 to 01/18/2022  1319   Patient history: 89 year old male with history of stroke had an episode of posturing right arm and left gaze.  EEG to evaluate for seizure.   Level of alertness:  lethargic    AEDs during EEG study: None   Technical aspects: This EEG study was done with scalp electrodes positioned according to the 10-20 International system of electrode placement. Electrical activity was acquired at a sampling rate of 500Hz  and reviewed with a high frequency filter of 70Hz  and a low frequency filter of 1Hz . EEG data were recorded continuously and digitally stored.    Description: EEG showed continuous generalized and and lateralized right hemisphere 3- 6 Hz theta-delta slowing. Hyperventilation and photic stimulation were not performed.     Of note, parts of study were difficult to interpret due to significant electrode artifact.   ABNORMALITY - Continuous slow, generalized and lateralized right hemisphere   IMPRESSION: This study is suggestive of cortical dysfunction arising from right hemisphere which is likely secondary to underlying strokes. Additionally there is moderate to severe diffuse encephalopathy, nonspecific etiology. No seizures or epileptiform discharges were seen throughout the recording.   Chad [name]

## 2022-01-18 NOTE — Progress Notes (Signed)
PT Cancellation Note  Patient Details Name: Chad [name] MRN: [id number] DOB: 04-13-1932   Cancelled Treatment:    Reason Eval/Treat Not Completed: Patient not medically ready. Pt intubated and sedated. Will follow up on Monday.   Shary Decamp [facility] 01/18/2022, 4:03 PM [place] Office [phone]

## 2022-01-18 NOTE — Progress Notes (Signed)
[facility]  Came to bedside to meet with family Questions answered  Roselie Awkward, MD [place] [facility] Pager: [phone] Cell: [phone] After 7:00 pm call Elink  [phone]

## 2022-01-19 ENCOUNTER — Inpatient Hospital Stay (HOSPITAL_COMMUNITY): Payer: Medicare Other

## 2022-01-19 DIAGNOSIS — I63231 Cerebral infarction due to unspecified occlusion or stenosis of right carotid arteries: Secondary | ICD-10-CM | POA: Diagnosis not present

## 2022-01-19 DIAGNOSIS — A419 Sepsis, unspecified organism: Secondary | ICD-10-CM | POA: Diagnosis not present

## 2022-01-19 DIAGNOSIS — N179 Acute kidney failure, unspecified: Secondary | ICD-10-CM | POA: Diagnosis not present

## 2022-01-19 DIAGNOSIS — J9601 Acute respiratory failure with hypoxia: Secondary | ICD-10-CM | POA: Diagnosis not present

## 2022-01-19 DIAGNOSIS — I639 Cerebral infarction, unspecified: Secondary | ICD-10-CM

## 2022-01-19 LAB — GLUCOSE, CAPILLARY
Glucose-Capillary: 102 mg/dL — ABNORMAL HIGH (ref 70–99)
Glucose-Capillary: 106 mg/dL — ABNORMAL HIGH (ref 70–99)
Glucose-Capillary: 109 mg/dL — ABNORMAL HIGH (ref 70–99)
Glucose-Capillary: 116 mg/dL — ABNORMAL HIGH (ref 70–99)
Glucose-Capillary: 118 mg/dL — ABNORMAL HIGH (ref 70–99)
Glucose-Capillary: 130 mg/dL — ABNORMAL HIGH (ref 70–99)
Glucose-Capillary: 98 mg/dL (ref 70–99)

## 2022-01-19 LAB — CBC
HCT: 28.5 % — ABNORMAL LOW (ref 39.0–52.0)
Hemoglobin: 9.5 g/dL — ABNORMAL LOW (ref 13.0–17.0)
MCH: 29 pg (ref 26.0–34.0)
MCHC: 33.3 g/dL (ref 30.0–36.0)
MCV: 86.9 fL (ref 80.0–100.0)
Platelets: 248 10*3/uL (ref 150–400)
RBC: 3.28 MIL/uL — ABNORMAL LOW (ref 4.22–5.81)
RDW: 16.7 % — ABNORMAL HIGH (ref 11.5–15.5)
WBC: 10.5 10*3/uL (ref 4.0–10.5)
nRBC: 0 % (ref 0.0–0.2)

## 2022-01-19 LAB — BASIC METABOLIC PANEL
Anion gap: 15 (ref 5–15)
BUN: 56 mg/dL — ABNORMAL HIGH (ref 8–23)
CO2: 23 mmol/L (ref 22–32)
Calcium: 7.9 mg/dL — ABNORMAL LOW (ref 8.9–10.3)
Chloride: 107 mmol/L (ref 98–111)
Creatinine, Ser: 2.95 mg/dL — ABNORMAL HIGH (ref 0.61–1.24)
GFR, Estimated: 20 mL/min — ABNORMAL LOW (ref >60)
Glucose, Bld: 98 mg/dL (ref 70–99)
Potassium: 3.6 mmol/L (ref 3.5–5.1)
Sodium: 145 mmol/L (ref 135–145)

## 2022-01-19 LAB — MAGNESIUM
Magnesium: 2.1 mg/dL (ref 1.7–2.4)
Magnesium: 2 mg/dL (ref 1.7–2.4)

## 2022-01-19 LAB — PHOSPHORUS
Phosphorus: 4.8 mg/dL — ABNORMAL HIGH (ref 2.5–4.6)
Phosphorus: 4.4 mg/dL (ref 2.5–4.6)

## 2022-01-19 MED ORDER — MIDAZOLAM HCL 2 MG/2ML IJ SOLN
INTRAMUSCULAR | Status: AC
  Administered 2022-01-19: 2 mg via INTRAVENOUS
  Filled 2022-01-19: qty 2

## 2022-01-19 MED ORDER — FUROSEMIDE 10 MG/ML IJ SOLN
INTRAMUSCULAR | Status: AC
  Administered 2022-01-19: 40 mg via INTRAVENOUS
  Filled 2022-01-19: qty 4

## 2022-01-19 MED ORDER — POTASSIUM CHLORIDE 20 MEQ PO PACK
40.0000 meq | PACK | Freq: Once | ORAL | Status: AC
Start: 2022-01-19 — End: 2022-01-19
  Administered 2022-01-19: 40 meq
  Filled 2022-01-19: qty 2

## 2022-01-19 MED ORDER — POTASSIUM CHLORIDE 10 MEQ/100ML IV SOLN: 10.0000 meq | INTRAVENOUS | Status: DC

## 2022-01-19 MED ORDER — MIDAZOLAM HCL 2 MG/2ML IJ SOLN: 2.0000 mg | INTRAMUSCULAR | Status: AC | PRN

## 2022-01-19 MED ORDER — POTASSIUM CHLORIDE 20 MEQ PO PACK: 40.0000 meq | PACK | Freq: Once | ORAL | Status: DC

## 2022-01-19 MED ORDER — DEXMEDETOMIDINE HCL IN NACL 400 MCG/100ML IV SOLN
0.4000 ug/kg/h | INTRAVENOUS | Status: DC
  Administered 2022-01-19: 0.5 ug/kg/h via INTRAVENOUS
  Administered 2022-01-19: 0.4 ug/kg/h via INTRAVENOUS
  Administered 2022-01-19: 0.2 ug/kg/h via INTRAVENOUS
  Administered 2022-01-21: 0.4 ug/kg/h via INTRAVENOUS
  Filled 2022-01-19 (×4): qty 100

## 2022-01-19 MED ORDER — FUROSEMIDE 10 MG/ML IJ SOLN
40.0000 mg | Freq: Four times a day (QID) | INTRAMUSCULAR | Status: AC
  Administered 2022-01-19: 40 mg via INTRAVENOUS
  Filled 2022-01-19: qty 4

## 2022-01-19 NOTE — Progress Notes (Signed)
RT assisted with transport of this pt from 3M14 to MRI and back while on full ventilatory support. Pt tolerated well with SVS. RT will continue to monitor pt.

## 2022-01-19 NOTE — Progress Notes (Signed)
NAME:  Chad [name], MRN:  [id number], DOB:  12-23-1931, LOS: 4 ADMISSION DATE:  01/14/2022, CONSULTATION DATE:  6/14 REFERRING MD:  Sloan Leiter, CHIEF COMPLAINT:  Dyspnea after aspiration   History of Present Illness:  88 y/o male was admitted for stroke on 5/28, had angioplasty of R ICA, then moved to rehab on 6/1.  At rehab had recurrent aspiration.  Admitted to ICU on 6/14 in setting of aspiration of tube feeds leading to acute respiratory failure with hypoxemia.    Pertinent  Medical History  Arthritis Heart murmur Hypertension Stroke  Significant Hospital Events: Including procedures, antibiotic start and stop dates in addition to other pertinent events   6/14 Admit from CIR, intubated upon arrival to ICU, concern for GI bleed, shock 6/15 convulsive movements of R arm, started LTM EEG, head CT findings worrisome for new stroke 6/16 EGD: gastric polyps noted, erythema of gastric antrum, no ulcer, food in esophagus and stomach 6/17 palliative care and pulmonary spoke to patient's family>  6/18 MRI Brain >>>    Interim History / Subjective:   RN reports vomiting again No acute events otherwise  Objective   Blood pressure 138/73, pulse 71, temperature 98.2 F (36.8 C), temperature source Oral, resp. rate 16, weight 68.8 kg, SpO2 100 %.    Vent Mode: PRVC FiO2 (%):  [30 %] 30 % Set Rate:  [16 bmp] 16 bmp Vt Set:  [420 mL] 420 mL PEEP:  [5 cmH20] 5 cmH20 Pressure Support:  [10 cmH20] 10 cmH20 Plateau Pressure:  [18 cmH20-21 cmH20] 18 cmH20   Intake/Output Summary (Last 24 hours) at 01/19/2022 0736 Last data filed at 01/19/2022 0600 Gross per 24 hour  Intake 658.66 ml  Output 3250 ml  Net -2591.34 ml   Filed Weights   01/18/22 0500 01/19/22 0408  Weight: 66.8 kg 68.8 kg    Examination:  General:  Resting comfortably in bed HENT: NCAT OP clear PULM: CTA B, normal effort CV: RRR, no mgr GI: BS+, soft, nontender MSK: normal bulk and tone Derm: R arm swelling  noted Neuro: awake, alert, no distress, MAEW   Resolved Hospital Problem list     Assessment & Plan:  Acute respiratory failure with hypoxemia due to aspiration pneumonia, organism unspecified Not ready for extubation 6/17 due to Cheyne-Stoke's breathing pattern and unclear ability to protect airway; need family to give definitive goals of care plan prior to considering extubation because he would be high risk for re-intubation  Aspiration pneumonia Full mechanical vent support VAP prevention Daily WUA/SBT Hold weaning attempt until more family conversations Complete 5 day course of unasyn  AKI on CKD IIIa due to ATN Hyperkalemia Anasarca 6/17 Bladder outlet obstruction Lasix x1 Monitor BMET and UOP Replace electrolytes as needed Continue bethanecol  GI bleed (large melanotic stool on 6/14), no clear etiology defined by EGD on 6/16; no ongoing bleeding 6/17 after starting asa, brillinta for new stroke Monitor for bleeding Transfuse PRBC for Hgb < 7 gm/dL Protonix  Acute CVA with left hemiplegia, new stroke on R noted on CT head 6/15 Convulsions 6/15, no further activity noted, LTM EEG negative for seizure Continue ASA, brillinta Monitor neuro exam MRI brain now  Chronic HFpEF Lasix x2 today  DM2 SSI Monitor CBG  Vomiting: Hold tube feedings Check KUB  Goals of care  Multiple daughters were updated at bedside on 6/17.  A daughter from [place] arrived and had questions which were answered.  In general the family seemed focused on his comfort  and quality of life and were not interested in doing procedures that may cause increased pain or suffering with little change in his long term prognosis.  Best Practice (right click and "Reselect all SmartList Selections" daily)   Diet/type: tubefeeds DVT prophylaxis: prophylactic heparin  GI prophylaxis: PPI Lines: N/A Foley:  Yes, and it is still needed Code Status:  full code Last date of multidisciplinary goals of  care discussion [daily, see above]  Critical care time: 32 minutes    Roselie Awkward, MD [place] [facility] Pager: [phone] Cell: [phone] After 7:00 pm call Elink  [phone]

## 2022-01-19 NOTE — Progress Notes (Signed)
STROKE TEAM PROGRESS NOTE   SUBJECTIVE (INTERVAL HISTORY) RN is at the bedside. Pt still intubated on vent. Open eyes on voice, but not following commands. Spontaneous moving on the right but not on the left. LTM EEG discontinued yesterday. Family is considering aggressive care vs. One-way extubation. Will do MRI and CUS to further evaluate stroke and help pt family making decisions.   OBJECTIVE Temp:  [97.4 F (36.3 C)-98.5 F (36.9 C)] 98.2 F (36.8 C) (06/18 0700) Pulse Rate:  [60-89] 75 (06/18 0900) Cardiac Rhythm: Normal sinus rhythm (06/17 2000) Resp:  [13-35] 16 (06/18 0900) BP: (84-179)/(51-116) 123/76 (06/18 0900) SpO2:  [95 %-100 %] 100 % (06/18 0900) FiO2 (%):  [30 %] 30 % (06/18 0819) Weight:  [68.8 kg] 68.8 kg (06/18 0408)  Recent Labs  Lab 01/18/22 1516 01/18/22 2015 01/19/22 0009 01/19/22 0404 01/19/22 0824  GLUCAP 111* 117* 116* 130* 98   Recent Labs  Lab 01/16/22 0859 01/16/22 1208 01/06/2022 0339 01/14/2022 1623 01/18/22 0429 01/18/22 1646 01/19/22 0558  NA 138 138 137  --  139  --  145  K >7.5* 5.6* 5.4*  --  3.7  --  3.6  CL 104 105 103  --  104  --  107  CO2 23 23 21*  --  21*  --  23  GLUCOSE 105* 115* 121*  --  153*  --  98  BUN 58* 56* 57*  --  57*  --  56*  CREATININE 3.12* 3.05* 3.01*  --  3.08*  --  2.95*  CALCIUM 8.4* 8.2* 8.0*  --  7.7*  --  7.9*  MG  --   --  1.7 2.2 2.3 2.1 2.1  PHOS  --   --  5.3* 5.5* 5.0* 4.5 4.8*   Recent Labs  Lab 01/24/2022 0703 01/11/2022 1118 01/16/22 0300 01/27/2022 0339 01/18/22 0429  AST 27 25 22 27 27  ALT 15 14 13 15 16  ALKPHOS 58 53 53 66 86  BILITOT 0.6 0.6 0.5 0.7 0.6  PROT 6.7 5.9* 5.4* 5.7* 6.1*  ALBUMIN 2.4* 2.1* 1.8* 2.0* 2.0*   Recent Labs  Lab 01/11/2022 0703 01/18/2022 1118 01/24/2022 1202 01/14/2022 2335 01/16/22 0300 01/21/2022 0339 01/18/22 0429 01/19/22 0558  WBC 14.2* 14.3*  --   --  18.4* 13.5* 8.9 10.5  NEUTROABS 9.9*  --   --   --  11.1*  --   --   --   HGB 8.0* 5.6*   < > 9.9* 9.7*  9.5* 8.7* 9.5*  HCT 24.4* 17.2*   < > 27.2* 27.5* 28.6* 26.0* 28.5*  MCV 84.1 86.4  --   --  88.7 89.1 87.2 86.9  PLT 298 278  --   --  212 250 214 248   < > = values in this interval not displayed.   No results for input(s): "CKTOTAL", "CKMB", "CKMBINDEX", "TROPONINI" in the last 168 hours. No results for input(s): "LABPROT", "INR" in the last 72 hours. No results for input(s): "COLORURINE", "LABSPEC", "PHURINE", "GLUCOSEU", "HGBUR", "BILIRUBINUR", "KETONESUR", "PROTEINUR", "UROBILINOGEN", "NITRITE", "LEUKOCYTESUR" in the last 72 hours.  Invalid input(s): "APPERANCEUR"      Component Value Date/Time   CHOL 149 12/30/2021 0412   TRIG 68 12/30/2021 0412   HDL 45 12/30/2021 0412   CHOLHDL 3.3 12/30/2021 0412   VLDL 14 12/30/2021 0412   LDLCALC 90 12/30/2021 0412   Lab Results  Component Value Date   HGBA1C 6.1 (H) 12/29/2021   No results found   for: "LABOPIA", "COCAINSCRNUR", "LABBENZ", "AMPHETMU", "THCU", "LABBARB"  No results for input(s): "ETH" in the last 168 hours.  I have personally reviewed the radiological images below and agree with the radiology interpretations.  DG Abd Portable 1V  Result Date: 01/07/2022 CLINICAL DATA:  Feeding tube EXAM: PORTABLE ABDOMEN - 1 VIEW COMPARISON:  None Available. FINDINGS: Enteric tube is present with tip overlying the distal stomach in the pyloric region. There is some contrast within the fundus of the stomach and colon. IMPRESSION: Enteric tube tip overlies the distal stomach. Electronically Signed   By: Macy Mis M.D.   On: 01/16/2022 10:51   Overnight EEG with video  Result Date: 01/27/2022 Lora Havens, MD     01/18/2022  1:32 PM Patient Name: Chad [name] MRN: [id number] Epilepsy Attending: Lora Havens Referring Physician/Provider: Gwinda Maine, MD Duration: 01/16/2022 1240 to 01/27/2022  1240  Patient history: 88 year old male with history of stroke had an episode of posturing right arm and left gaze.  EEG to  evaluate for seizure.  Level of alertness:  lethargic  AEDs during EEG study: None  Technical aspects: This EEG study was done with scalp electrodes positioned according to the 10-20 International system of electrode placement. Electrical activity was acquired at a sampling rate of 500Hz and reviewed with a high frequency filter of 70Hz and a low frequency filter of 1Hz. EEG data were recorded continuously and digitally stored.  Description: EEG showed continuous generalized and and lateralized right hemisphere 3- 6 Hz theta-delta slowing. Hyperventilation and photic stimulation were not performed.    ABNORMALITY - Continuous slow, generalized and lateralized right hemisphere  IMPRESSION: This study is suggestive of cortical dysfunction arising from right hemisphere which is likely secondary to underlying strokes. Additionally there is moderate to severe diffuse encephalopathy, nonspecific etiology. No seizures or epileptiform discharges were seen throughout the recording.  Lora Havens    EEG adult  Result Date: 01/16/2022 Lora Havens, MD     01/16/2022 12:45 PM Patient Name: Chad [name] MRN: [id number] Epilepsy Attending: Lora Havens Referring Physician/Provider: Corey Harold, NP Date: 01/16/2022 Duration: 21.43 mins Patient history: 88 year old male with history of stroke had an episode of posturing right arm and left gaze.  EEG to evaluate for seizure. Level of alertness:  lethargic AEDs during EEG study: Ativan Technical aspects: This EEG study was done with scalp electrodes positioned according to the 10-20 International system of electrode placement. Electrical activity was acquired at a sampling rate of 500Hz and reviewed with a high frequency filter of 70Hz and a low frequency filter of 1Hz. EEG data were recorded continuously and digitally stored. Description: EEG showed continuous generalized and maximal left frontal 3- 6 Hz theta-delta slowing which at times appears sharply  contoured. Hyperventilation and photic stimulation were not performed.   ABNORMALITY - Continuous slow, generalized IMPRESSION: This study is suggestive of moderate to severe diffuse encephalopathy, nonspecific etiology. No seizures or epileptiform discharges were seen throughout the recording. Priyanka Barbra Sarks   CT HEAD WO CONTRAST (5MM)  Result Date: 01/16/2022 CLINICAL DATA:  Mental status change, unknown cause. EXAM: CT HEAD WITHOUT CONTRAST TECHNIQUE: Contiguous axial images were obtained from the base of the skull through the vertex without intravenous contrast. RADIATION DOSE REDUCTION: This exam was performed according to the departmental dose-optimization program which includes automated exposure control, adjustment of the mA and/or kV according to patient size and/or use of iterative reconstruction technique. COMPARISON:  CT angiogram head/neck 12/30/2021. Brain  MRI 12/30/2021. FINDINGS: Brain: Moderate cerebral atrophy. New from the prior head CT of 12/30/2021, there is a small to moderate-sized acute/subacute cortical and subcortical infarct within the right frontoparietal lobes and right insula (right MCA vascular territory). Additional known small subacute infarcts within the right MCA vascular territory were better appreciated on the prior brain MRI of 12/30/2021 (acute at that time). Background advanced patchy and ill-defined hypoattenuation within the cerebral white matter, nonspecific but compatible chronic small vessel ischemic disease. Redemonstrated chronic lacunar infarcts within the left basal ganglia and thalamus. There is no acute intracranial hemorrhage. No extra-axial fluid collection. No evidence of an intracranial mass. No midline shift. Vascular: Atherosclerotic calcifications. Redemonstrated focus of calcification along an M2 right middle cerebral artery, which may reflect a calcified embolus. Skull: No fracture or aggressive osseous lesion. Sinuses/Orbits: No mass or acute finding  within the imaged orbits. Trace mucosal thickening within the bilateral ethmoid sinuses. IMPRESSION: New from the prior head CT of 12/30/2021, there is a small to moderate-sized acute/subacute cortical and subcortical infarct within the right frontoparietal lobes and right insula (right MCA vascular territory). Additional known small subacute infarcts within the right MCA vascular territory were better appreciated on the prior brain MRI of 12/30/2021 (acute at that time). Background advanced chronic small vessel ischemic changes within the cerebral white matter. Redemonstrated chronic lacunar infarcts within the left basal ganglia and left thalamus. Moderate generalized cerebral atrophy. Electronically Signed   By: Kyle  Golden D.O.   On: 01/16/2022 11:28   US RENAL  Result Date: 01/25/2022 CLINICAL DATA:  Renal dysfunction EXAM: RENAL / URINARY TRACT ULTRASOUND COMPLETE COMPARISON:  None Available. FINDINGS: Right Kidney: Renal measurements: 8.9 x 5 x 5 cm = volume: 115.2 mL. There is no hydronephrosis. There is increased cortical echogenicity. Left Kidney: Renal measurements: 10.3 x 6.2 x 5 cm = volume: 165.9 mL. There is no hydronephrosis. There is increased cortical echogenicity. Bladder: Foley catheter is seen in the bladder. Bladder is not adequately distended for evaluation. Other: 1.1 cm splenule is noted adjacent to the inferior margin of spleen. Left pleural effusion is seen. IMPRESSION: There is no hydronephrosis. Increased cortical echogenicity suggests medical renal disease. Left pleural effusion. Electronically Signed   By: Palani  Rathinasamy M.D.   On: 01/27/2022 12:21   Portable Chest x-ray  Result Date: 01/19/2022 CLINICAL DATA:  Intubation EXAM: PORTABLE CHEST 1 VIEW COMPARISON:  01/03/2022, 3:13 a.m. FINDINGS: Interval endotracheal intubation, tip approximately 2 cm above the carina. Enteric feeding tube remains in position, partially imaged. Cardiomegaly. Diffuse bilateral heterogeneous  and interstitial airspace opacity, unchanged. IMPRESSION: 1. Interval endotracheal intubation, tip approximately 2 cm above the carina. Enteric feeding tube remains in position, partially imaged. 2. Cardiomegaly. Diffuse bilateral heterogeneous and interstitial airspace opacity, unchanged. Electronically Signed   By: Alex D Bibbey M.D.   On: 01/22/2022 10:39   DG Abd 1 View  Result Date: 01/09/2022 CLINICAL DATA:  89-year-old male with abdominal pain and vomiting. EXAM: ABDOMEN - 1 VIEW COMPARISON:  01/10/2022. FINDINGS: Portable AP supine views at 0704 hours. Patient is mildly rotated to the left but the enteric tube tip is now in the gastric body. Extensive Calcified aortic atherosclerosis. Pelvic vascular calcifications. Progressive opacification of the visible left lung base, left hemidiaphragm now largely obscured. Decreased large bowel gas and borderline to mildly dilated gas-filled mid abdominal small bowel loops now. Spinal scoliosis and degeneration. No acute osseous abnormality identified. IMPRESSION: 1. Enteric tube tip now in the gastric body. 2. Decreased large bowel gas   and borderline to mildly dilated mid abdominal small bowel loops. This could reflect ileus or developing small bowel obstruction. Follow-up abdominal radiographs may be valuable. 3. Progressive opacification at the left lung base since 01/10/2022, see portable chest x-ray 0313 hours today. Electronically Signed   By: H  Hall M.D.   On: 01/05/2022 07:37   DG Chest Port 1 View  Result Date: 01/25/2022 CLINICAL DATA:  Acute respiratory distress EXAM: PORTABLE CHEST 1 VIEW COMPARISON:  03/23/2022 FINDINGS: Cardiomegaly. Vascular congestion. Aortic atherosclerosis. Perihilar airspace opacities bilaterally. No effusions. No acute bony abnormality. IMPRESSION: Perihilar airspace opacities, likely edema although pneumonia not excluded. Electronically Signed   By: Kevin  Dover M.D.   On: 01/20/2022 03:29   DG Swallowing Func-Speech  Pathology  Result Date: 01/10/2022 Table formatting from the original result was not included. Objective Swallowing Evaluation: Type of Study: MBS-Modified Barium Swallow Study  Patient Details Name: Chad [name] MRN: [id number] Date of Birth: 06/13/1932 Today's Date: 01/10/2022 Time: SLP Start Time (ACUTE ONLY): 1100 -SLP Stop Time (ACUTE ONLY): 1123 SLP Time Calculation (min) (ACUTE ONLY): 23 min Past Medical History: Past Medical History: Diagnosis Date  Arthritis   Heart murmur   asa child   Hypertension   Stroke (HCC)  Past Surgical History: Past Surgical History: Procedure Laterality Date  EYE SURGERY Bilateral 2022  IR ANGIO INTRA EXTRACRAN SEL COM CAROTID INNOMINATE UNI R MOD SED  01/01/2022  IR CT HEAD LTD  01/01/2022  IR PTA INTRACRANIAL  01/01/2022  JOINT REPLACEMENT    RADIOLOGY WITH ANESTHESIA N/A 01/01/2022  Procedure: Angiogram;  Surgeon: Deveshwar, Sanjeev, MD;  Location: [facility] OR;  Service: Radiology;  Laterality: N/A;  TONSILLECTOMY    TOTAL KNEE ARTHROPLASTY Right 03/30/2013  Procedure: RIGHT TOTAL KNEE ARTHROPLASTY;  Surgeon: Ronald A Gioffre, MD;  Location: [facility];  Service: Orthopedics;  Laterality: Right;  TOTAL KNEE ARTHROPLASTY Left 10/09/2020  Procedure: TOTAL KNEE ARTHROPLASTY;  Surgeon: Olin, Matthew, MD;  Location: [facility];  Service: Orthopedics;  Laterality: Left;  70 mins HPI: 88 yo male presenting to the ED on 5/28 with L sided weakness, facial droop, slurred speech, and fall. 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 PMH including hypothyroidism chronic pain syndromes, CKD, HTN, arthritis, bil TKA.  Subjective: alert, friendly  Recommendations for follow up therapy are one component of a multi-disciplinary discharge planning process, led by the attending physician.  Recommendations may be updated based on patient status, additional functional criteria and insurance authorization. Assessment / Plan / Recommendation   01/10/2022   8:50 PM  Clinical Impressions Clinical Impressions SLP Visit Diagnosis MBSS completed secondary to clinical concerns for aspiration of current diet textures (puree and nectar-thick liquids). At this time, pt presents with severe oropharyngeal dysphagia, with significant UES dysfunction, which resulted in significant posterior tracheal sensed aspiration (PAS 7) of pureed texture via interarytenoid space due to overflow of pyriform sinus residuals into laryngeal vestibule. Unfortunately, pharyngeal stasis and pyriform sinus residuals could not clear due to poor UES relaxation with mild esophageal backflow to pyriform sinuses. Additionally, delayed sensation of gross aspiration of nectar-thick liquid via tsp was appreciated during the swallow. [name] cough response was not effective in pulmonary clearance. Pt required tactile, hyoglossal assistance and verbal cues, from SLP, to initiate swallow response during puree trials. It should be noted that this is a significant change from pt's MBSS results documented on 01/02/2022.   Given pt's risk severity for development of aspiration PNA (Total A   for oral care, decreased ambulation, deconditioning) and change in swallow function, recommend initiation of NPO with temporary alternate means of nutrition, hydration, and medications at this time. May consider GI referral to assess UES function if improvement is not noted with therapy alone. [name] daughter present and results + recommendations were reviewed with pt an daughter who verbalized agreement with recommendations following education. MD, LPN, and PA notified of recommendations. Please see imaging report for full details. Dysphagia, oropharyngeal phase (R13.12);Cognitive communication deficit (R41.841) Impact on safety and function Severe aspiration risk;Risk for inadequate nutrition/hydration     01/02/2022  11:00 AM Treatment Recommendations Treatment Recommendations Therapy as outlined in treatment plan below     01/10/2022   1:59 PM  Prognosis Prognosis for Safe Diet Advancement Good Barriers to Reach Goals Cognitive deficits;Severity of deficits   01/10/2022   8:50 PM Diet Recommendations SLP Diet Recommendations NPO;Alternative means - temporary Medication Administration Via alternative means Postural Changes Other (Comment)     01/10/2022   8:50 PM Other Recommendations Recommended Consults Consider GI evaluation;Consider esophageal assessment Oral Care Recommendations Oral care QID Other Recommendations Have oral suction available   01/10/2022   8:50 PM Frequency and Duration  Treatment Duration 2 weeks     01/10/2022   1:53 PM Oral Phase Oral Phase Impaired Oral - Nectar Teaspoon Weak lingual manipulation;Lingual pumping;Delayed oral transit;Decreased bolus cohesion;Premature spillage;Lingual/palatal residue;Piecemeal swallowing;Reduced posterior propulsion Oral - Nectar Cup NT Oral - Nectar Straw NT Oral - Thin Cup NT Oral - Thin Straw NT Oral - Puree Weak lingual manipulation;Lingual pumping;Incomplete tongue to palate contact;Lingual/palatal residue;Delayed oral transit;Decreased bolus cohesion;Premature spillage;Holding of bolus;Piecemeal swallowing Oral - Mech Soft NT Oral - Regular NT Oral - Multi-Consistency NT Oral - Pill NT    01/10/2022   1:54 PM Pharyngeal Phase Pharyngeal Phase Impaired Pharyngeal- Nectar Teaspoon Delayed swallow initiation-pyriform sinuses;Reduced pharyngeal peristalsis;Reduced airway/laryngeal closure;Moderate aspiration;Inter-arytenoid space residue;Pharyngeal residue - cp segment;Pharyngeal residue - posterior pharnyx;Pharyngeal residue - pyriform;Pharyngeal residue - valleculae;Lateral channel residue;Penetration/Aspiration during swallow;Reduced laryngeal elevation Pharyngeal Material enters airway, passes BELOW cords and not ejected out despite cough attempt by patient Pharyngeal- Nectar Cup NT Pharyngeal- Nectar Straw NT Pharyngeal- Thin Cup NT Pharyngeal- Thin Straw NT Pharyngeal- Puree Delayed swallow  initiation-pyriform sinuses;Reduced pharyngeal peristalsis;Reduced anterior laryngeal mobility;Reduced tongue base retraction;Penetration/Apiration after swallow;Moderate aspiration;Pharyngeal residue - pyriform;Pharyngeal residue - posterior pharnyx;Pharyngeal residue - cp segment;Inter-arytenoid space residue;Lateral channel residue;Compensatory strategies attempted (with notebox) Pharyngeal Material enters airway, passes BELOW cords and not ejected out despite cough attempt by patient Pharyngeal- Mechanical Soft NT Pharyngeal- Regular NT Pharyngeal- Multi-consistency NT Pharyngeal- Pill NT    01/10/2022   1:58 PM Cervical Esophageal Phase  Cervical Esophageal Phase Impaired Nectar Teaspoon Reduced cricopharyngeal relaxation Nectar Cup NT Nectar Straw NT Puree Esophageal backflow into the pharynx;Reduced cricopharyngeal relaxation Mechanical Soft NT Regular NT Multi-consistency NT Pill NT Cervical Esophageal Comment Decreased relaxation of the UES observed with significant pyriform sinus residuals Chad [name] 01/10/2022, 9:06 PM                     DG Abd Portable 1V  Result Date: 01/10/2022 CLINICAL DATA:  Feeding tube placement. EXAM: PORTABLE ABDOMEN - 1 VIEW COMPARISON:  None Available. FINDINGS: Feeding tube tip is at the level of the gastric antrum. Small rounded densities are seen in the region of the colon likely related to diverticula. There is a heterogeneous rounded density projecting over the right upper quadrant measuring 3.0 x 3.0 cm. This is indeterminate. Lung bases are   clear. There is mild curvature of the lumbar spine with degenerative change. IMPRESSION: 1. Feeding tube tip is at the level of the gastric antrum. 2. Scattered colonic diverticula. 3. 3 cm heterogeneous density in the right upper quadrant, indeterminate. Findings may represent a large gallstone, renal calcification or other calcified mass. Please correlate clinically. This can be further evaluated with CT. Electronically Signed    By: Amy  Guttmann M.D.   On: 01/10/2022 15:07   IR ANGIO INTRA EXTRACRAN SEL COM CAROTID INNOMINATE UNI R MOD SED  Result Date: 01/03/2022 CLINICAL DATA:  Left-sided weakness with dysarthria. Patient with calcified embolus in the inferior division right MCA. Discovered to have high-grade stenosis of the extracranial right ICA proximally EXAM: IR ANGIO INTRA EXTRACRAN SEL COM CAROTID INNOMINATE UNI RIGHT MOD SED COMPARISON:  CT angiogram of the head and neck of Dec 30, 2021. MEDICATIONS: Heparin 3,000 units IV. Ancef 2 g IV antibiotic was administered within 1 hour of the procedure. ANESTHESIA/SEDATION: General anesthesia. CONTRAST:  Omnipaque 300 approximately 120 cc. FLUOROSCOPY TIME:  Fluoroscopy Time: 76 minutes 36) seconds (1257 mGy). COMPLICATIONS: None immediate. TECHNIQUE: Informed written consent was obtained from the patient after a thorough discussion of the procedural risks, benefits and alternatives. All questions were addressed. Maximal Sterile Barrier Technique was utilized including caps, mask, sterile gowns, sterile gloves, sterile drape, hand hygiene and skin antiseptic. A timeout was performed prior to the initiation of the procedure. The left groin was prepped and draped in the usual sterile fashion. Thereafter using modified Seldinger technique, transfemoral access into the left common femoral artery was obtained without difficulty. Over a 0.035 inch guidewire, an 8 French 25 cm Pinnacle sheath was inserted. Through this, and also over 0.035 inch guidewire, a 120 cm 6 French Simmons 2 catheter inside of an 087 95 cm balloon guide catheter combination was advanced to the aortic arch region and selectively positioned in the proximal right common carotid artery and the innominate artery. An arteriogram was then performed centered extra cranially and intracranially. FINDINGS: The innominate arteriogram demonstrates significant tortuosity of the innominate artery in its entirety extending into the  right common carotid artery with a 180 degree loop in the proximal right common carotid artery. Subclavian artery demonstrates patency with opacification of a hypoplastic right vertebral artery extra cranially. The right common carotid arteriogram demonstrates complex atherosclerotic calcified plaque at the right common carotid bifurcation extending into the bulb region of the right internal carotid artery resulting in a high-grade approximately 80-90% stenosis. More distally, the right internal carotid artery opacifies to the cranial skull base. Patency is seen of the petrous, cavernous and supraclinoid right ICA with a prominent right posterior communicating artery. Proximal portions of the right middle cerebral artery and the right anterior cerebral artery demonstrate patency. ENDOVASCULAR REVASCULARIZATION OF HIGH-GRADE STENOSIS OF THE RIGHT INTERNAL CAROTID ARTERY PROXIMALLY WITH BALLOON ANGIOPLASTY The balloon guide catheter was advanced into the proximal right common carotid artery. Through this, and also over a 0.014 inch Transend EX soft tip micro guidewire, an 021 150 cm microcatheter was then advanced with biplane DSA roadmap to the proximal cavernous right ICA followed by the microcatheter. The micro guidewire was removed. Good aspiration obtained from the hub of the microcatheter. A gentle control arteriogram performed through the microcatheter demonstrated safe positioning of the tip of the microcatheter. This in turn was then exchanged out for a 300 cm 014 inch BMW exchange wire with a moderate J configuration. The tip of the micro guidewire was positioned at   the petrous cavernous junction. Measurements were then performed of the right internal carotid artery distal to the stenosis, and just proximal to the stenosis of the distal common carotid artery. A 5 mm x 20 mm 014 inch Viatrac balloon guide catheter was then prepped and purged retrogradely with heparinized saline infusion, and retrogradely with  50% contrast and 50% heparinized saline infusion. Using the rapid exchange technique, the balloon catheter was then advanced without difficulty and positioned with the distal and proximal markers adequate distant from the site of the severe stenosis in the proximal right internal carotid artery. Slow control inflation was then performed using micro inflation syringe device via micro tubing. Inflations were performed slowly to 9 atmospheres where it was maintained for approximately 60 seconds. Thereafter, the balloon was deflated and retrieved and removed. A control arteriogram performed through the balloon guide catheter in the proximal right common carotid artery demonstrated significantly improved caliber and flow through the angioplastied segment. More distally, free flow was noted into the distal right ICA intracranially. Over the exchange BMW micro guidewire, a 8 mm x 26 mm Wallstent stent delivery system which had been prepped with heparinized saline infusion was then advanced again using the rapid exchange technique to the proximal right common carotid artery. Resistance was encountered to further advancement of the stent delivery apparatus. At this time the balloon guide catheter was advanced to the distal right common carotid artery. Further attempts were made to advance the stent delivery apparatus into the right internal carotid proximally. Advancement could only manage just distal to the angioplastied segment with no significant coverage of the angioplastied segment. Further attempts at advancing were met with resistance secondary to a step-off related to the calcified plaque. After multiple attempts it was decided to stop the procedure. Final control arteriogram performed through the balloon guide catheter in the right common carotid artery continued to demonstrate excellent flow through the right external carotid artery with a mild-to-moderate stenosis at its origin. The right internal carotid artery  proximally demonstrated approximately 60% patency which was maintained on subsequent 20 minute arteriogram. Centered intracranially demonstrated brisk flow into the right anterior cerebral artery and the right middle cerebral distribution. The previously occluded anterior branch of the inferior division proximally due to a calcified plaque remained stable. The delayed arterial phase demonstrated retrograde opacification of the posterior 2/3 of the cortical branches in the sylvian triangle from the branches of the inferior division and the superior division, and also from the pericallosal branches. The balloon guide catheter was removed. The 8 French Pinnacle sheath in the left common femoral artery was removed with manual compression held for approximately 25 minutes with a quick clot to achieved hemostasis at this site. Distal pulses remained palpable in the dorsalis pedis arteries bilaterally unchanged. An immediate CT of the brain demonstrated no evidence of hemorrhagic complications. The patient was then gradually extubated without difficulty. Patient was able to maintain his airway, and oxygen concentrations. He was able to move his right side spontaneously and to command. The patient was able to follow simple commands appropriately. No significant motor movement was evident in the left upper extremity, with only a flicker in the left foot toes. Patient was then transferred to the PACU and then subsequently the neuro ICU for post revascularization care. Later in the afternoon, the patient appeared more awake, alert and appropriately responsive. Had mild intermittent right gaze deviation but was able to track his eyes to the left past the midline. Otherwise, neurologically he remained stable unchanged.  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  PLAN: Follow-up in the clinic 4 to 6 weeks post discharge. Electronically Signed   By: Luanne Bras M.D.   On: 01/03/2022 08:12   IR CT Head Ltd  Result Date: 01/03/2022 CLINICAL DATA:  Left-sided weakness with dysarthria. Patient with calcified embolus in the inferior division right MCA. Discovered to have high-grade stenosis of the extracranial right ICA proximally EXAM: IR ANGIO INTRA EXTRACRAN SEL COM CAROTID INNOMINATE UNI RIGHT MOD SED COMPARISON:  CT angiogram of the head and neck of Dec 30, 2021. MEDICATIONS: Heparin 3,000 units IV. Ancef 2 g IV antibiotic was administered within 1 hour of the procedure. ANESTHESIA/SEDATION: General anesthesia. CONTRAST:  Omnipaque 300 approximately 120 cc. FLUOROSCOPY TIME:  Fluoroscopy Time: 76 minutes 36) seconds (1257 mGy). COMPLICATIONS: None immediate. TECHNIQUE: Informed written consent was obtained from the patient after a thorough discussion of the procedural risks, benefits and alternatives. All questions were addressed. Maximal Sterile Barrier Technique was utilized including caps, mask, sterile gowns, sterile gloves, sterile drape, hand hygiene and skin antiseptic. A timeout was performed prior to the initiation of the procedure. The left groin was prepped and draped in the usual sterile fashion. Thereafter using modified Seldinger technique, transfemoral access into the left common femoral artery was obtained without difficulty. Over a 0.035 inch guidewire, an 8 French 25 cm Pinnacle sheath was inserted. Through this, and also over 0.035 inch guidewire, a 120 cm 6 French Simmons 2 catheter inside of an 087 95 cm balloon guide catheter combination was advanced to the aortic arch region and selectively positioned in the proximal right common carotid artery and the innominate artery. An arteriogram was then performed centered extra cranially and intracranially. FINDINGS: The innominate arteriogram demonstrates significant tortuosity of the innominate  artery in its entirety extending into the right common carotid artery with a 180 degree loop in the proximal right common carotid artery. Subclavian artery demonstrates patency with opacification of a hypoplastic right vertebral artery extra cranially. The right common carotid arteriogram demonstrates complex atherosclerotic calcified plaque at the right common carotid bifurcation extending into the bulb region of the right internal carotid artery resulting in a high-grade approximately 80-90% stenosis. More distally, the right internal carotid artery opacifies to the cranial skull base. Patency is seen of the petrous, cavernous and supraclinoid right ICA with a prominent right posterior communicating artery. Proximal portions of the right middle cerebral artery and the right anterior cerebral artery demonstrate patency. ENDOVASCULAR REVASCULARIZATION OF HIGH-GRADE STENOSIS OF THE RIGHT INTERNAL CAROTID ARTERY PROXIMALLY WITH BALLOON ANGIOPLASTY The balloon guide catheter was advanced into the proximal right common carotid artery. Through this, and also over a 0.014 inch Transend EX soft tip micro guidewire, an 021 150 cm microcatheter was then advanced with biplane DSA roadmap to the proximal cavernous right ICA followed by the microcatheter. The micro guidewire was removed. Good aspiration obtained from the hub of the microcatheter. A gentle control arteriogram performed through the microcatheter demonstrated safe positioning of the tip of the microcatheter. This in turn was then exchanged out for a 300 cm 014 inch BMW exchange wire with a moderate J configuration. The tip of the micro guidewire was positioned at the petrous cavernous junction. Measurements were then performed of the right internal carotid artery distal to the stenosis, and just proximal to the stenosis of the distal common carotid  artery. A 5 mm x 20 mm 014 inch Viatrac balloon guide catheter was then prepped and purged retrogradely with  heparinized saline infusion, and retrogradely with 50% contrast and 50% heparinized saline infusion. Using the rapid exchange technique, the balloon catheter was then advanced without difficulty and positioned with the distal and proximal markers adequate distant from the site of the severe stenosis in the proximal right internal carotid artery. Slow control inflation was then performed using micro inflation syringe device via micro tubing. Inflations were performed slowly to 9 atmospheres where it was maintained for approximately 60 seconds. Thereafter, the balloon was deflated and retrieved and removed. A control arteriogram performed through the balloon guide catheter in the proximal right common carotid artery demonstrated significantly improved caliber and flow through the angioplastied segment. More distally, free flow was noted into the distal right ICA intracranially. Over the exchange BMW micro guidewire, a 8 mm x 26 mm Wallstent stent delivery system which had been prepped with heparinized saline infusion was then advanced again using the rapid exchange technique to the proximal right common carotid artery. Resistance was encountered to further advancement of the stent delivery apparatus. At this time the balloon guide catheter was advanced to the distal right common carotid artery. Further attempts were made to advance the stent delivery apparatus into the right internal carotid proximally. Advancement could only manage just distal to the angioplastied segment with no significant coverage of the angioplastied segment. Further attempts at advancing were met with resistance secondary to a step-off related to the calcified plaque. After multiple attempts it was decided to stop the procedure. Final control arteriogram performed through the balloon guide catheter in the right common carotid artery continued to demonstrate excellent flow through the right external carotid artery with a mild-to-moderate stenosis  at its origin. The right internal carotid artery proximally demonstrated approximately 60% patency which was maintained on subsequent 20 minute arteriogram. Centered intracranially demonstrated brisk flow into the right anterior cerebral artery and the right middle cerebral distribution. The previously occluded anterior branch of the inferior division proximally due to a calcified plaque remained stable. The delayed arterial phase demonstrated retrograde opacification of the posterior 2/3 of the cortical branches in the sylvian triangle from the branches of the inferior division and the superior division, and also from the pericallosal branches. The balloon guide catheter was removed. The 8 French Pinnacle sheath in the left common femoral artery was removed with manual compression held for approximately 25 minutes with a quick clot to achieved hemostasis at this site. Distal pulses remained palpable in the dorsalis pedis arteries bilaterally unchanged. An immediate CT of the brain demonstrated no evidence of hemorrhagic complications. The patient was then gradually extubated without difficulty. Patient was able to maintain his airway, and oxygen concentrations. He was able to move his right side spontaneously and to command. The patient was able to follow simple commands appropriately. No significant motor movement was evident in the left upper extremity, with only a flicker in the left foot toes. Patient was then transferred to the PACU and then subsequently the neuro ICU for post revascularization care. Later in the afternoon, the patient appeared more awake, alert and appropriately responsive. Had mild intermittent right gaze deviation but was able to track his eyes to the left past the midline. Otherwise, neurologically he remained stable unchanged. 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 PLAN: Follow-up in the clinic 4 to 6 weeks post discharge. Electronically Signed   By: Sanjeev  Deveshwar M.D.   On: 01/03/2022 08:12   IR PTA Intracranial  Result Date: 01/03/2022 CLINICAL DATA:  Left-sided weakness with dysarthria. Patient with calcified embolus in the inferior division right MCA. Discovered to have high-grade stenosis of the extracranial right ICA proximally EXAM: IR ANGIO INTRA EXTRACRAN SEL COM CAROTID INNOMINATE UNI RIGHT MOD SED COMPARISON:  CT angiogram of the head and neck of Dec 30, 2021. MEDICATIONS: Heparin 3,000 units IV. Ancef 2 g IV antibiotic was administered within 1 hour of the procedure. ANESTHESIA/SEDATION: General anesthesia. CONTRAST:  Omnipaque 300 approximately 120 cc. FLUOROSCOPY TIME:  Fluoroscopy Time: 76 minutes 36) seconds (1257 mGy). COMPLICATIONS: None immediate. TECHNIQUE: Informed written consent was obtained from the patient after a thorough discussion of the procedural risks, benefits and alternatives. All questions were addressed. Maximal Sterile Barrier Technique was utilized including caps, mask, sterile gowns, sterile gloves, sterile drape, hand hygiene and skin antiseptic. A timeout was performed prior to the initiation of the procedure. The left groin was prepped and draped in the usual sterile fashion. Thereafter using modified Seldinger technique, transfemoral access into the left common femoral artery was obtained without difficulty. Over a 0.035 inch guidewire, an 8 French 25 cm Pinnacle sheath was inserted. Through this, and also over 0.035 inch guidewire, a 120 cm 6 French Simmons 2 catheter inside of an 087 95 cm balloon guide catheter combination was advanced to the aortic arch region and selectively positioned in the proximal right common carotid artery and the innominate artery. An arteriogram was then performed centered extra cranially and intracranially. FINDINGS: The innominate  arteriogram demonstrates significant tortuosity of the innominate artery in its entirety extending into the right common carotid artery with a 180 degree loop in the proximal right common carotid artery. Subclavian artery demonstrates patency with opacification of a hypoplastic right vertebral artery extra cranially. The right common carotid arteriogram demonstrates complex atherosclerotic calcified plaque at the right common carotid bifurcation extending into the bulb region of the right internal carotid artery resulting in a high-grade approximately 80-90% stenosis. More distally, the right internal carotid artery opacifies to the cranial skull base. Patency is seen of the petrous, cavernous and supraclinoid right ICA with a prominent right posterior communicating artery. Proximal portions of the right middle cerebral artery and the right anterior cerebral artery demonstrate patency. ENDOVASCULAR REVASCULARIZATION OF HIGH-GRADE STENOSIS OF THE RIGHT INTERNAL CAROTID ARTERY PROXIMALLY WITH BALLOON ANGIOPLASTY The balloon guide catheter was advanced into the proximal right common carotid artery. Through this, and also over a 0.014 inch Transend EX soft tip micro guidewire, an 021 150 cm microcatheter was then advanced with biplane DSA roadmap to the proximal cavernous right ICA followed by the microcatheter. The micro guidewire was removed. Good aspiration obtained from the hub of the microcatheter. A gentle control arteriogram performed through the microcatheter demonstrated safe positioning of the tip of the microcatheter. This in turn was then exchanged out for a 300 cm 014 inch BMW exchange wire with a moderate J configuration. The tip of the micro guidewire was positioned at the petrous cavernous junction. Measurements were then performed of the right internal carotid artery distal to the stenosis, and just proximal to the stenosis of the distal common carotid artery. A 5 mm x 20 mm 014 inch Viatrac balloon  guide catheter was then prepped and purged retrogradely with heparinized saline infusion, and retrogradely with 50% contrast and   50% heparinized saline infusion. Using the rapid exchange technique, the balloon catheter was then advanced without difficulty and positioned with the distal and proximal markers adequate distant from the site of the severe stenosis in the proximal right internal carotid artery. Slow control inflation was then performed using micro inflation syringe device via micro tubing. Inflations were performed slowly to 9 atmospheres where it was maintained for approximately 60 seconds. Thereafter, the balloon was deflated and retrieved and removed. A control arteriogram performed through the balloon guide catheter in the proximal right common carotid artery demonstrated significantly improved caliber and flow through the angioplastied segment. More distally, free flow was noted into the distal right ICA intracranially. Over the exchange BMW micro guidewire, a 8 mm x 26 mm Wallstent stent delivery system which had been prepped with heparinized saline infusion was then advanced again using the rapid exchange technique to the proximal right common carotid artery. Resistance was encountered to further advancement of the stent delivery apparatus. At this time the balloon guide catheter was advanced to the distal right common carotid artery. Further attempts were made to advance the stent delivery apparatus into the right internal carotid proximally. Advancement could only manage just distal to the angioplastied segment with no significant coverage of the angioplastied segment. Further attempts at advancing were met with resistance secondary to a step-off related to the calcified plaque. After multiple attempts it was decided to stop the procedure. Final control arteriogram performed through the balloon guide catheter in the right common carotid artery continued to demonstrate excellent flow through the  right external carotid artery with a mild-to-moderate stenosis at its origin. The right internal carotid artery proximally demonstrated approximately 60% patency which was maintained on subsequent 20 minute arteriogram. Centered intracranially demonstrated brisk flow into the right anterior cerebral artery and the right middle cerebral distribution. The previously occluded anterior branch of the inferior division proximally due to a calcified plaque remained stable. The delayed arterial phase demonstrated retrograde opacification of the posterior 2/3 of the cortical branches in the sylvian triangle from the branches of the inferior division and the superior division, and also from the pericallosal branches. The balloon guide catheter was removed. The 8 French Pinnacle sheath in the left common femoral artery was removed with manual compression held for approximately 25 minutes with a quick clot to achieved hemostasis at this site. Distal pulses remained palpable in the dorsalis pedis arteries bilaterally unchanged. An immediate CT of the brain demonstrated no evidence of hemorrhagic complications. The patient was then gradually extubated without difficulty. Patient was able to maintain his airway, and oxygen concentrations. He was able to move his right side spontaneously and to command. The patient was able to follow simple commands appropriately. No significant motor movement was evident in the left upper extremity, with only a flicker in the left foot toes. Patient was then transferred to the PACU and then subsequently the neuro ICU for post revascularization care. Later in the afternoon, the patient appeared more awake, alert and appropriately responsive. Had mild intermittent right gaze deviation but was able to track his eyes to the left past the midline. Otherwise, neurologically he remained stable unchanged. IMPRESSION: Status post endovascular revascularization of symptomatic high-grade stenosis of the  proximal right ICA with a 5 mm x 20 mm Viatrac 14 balloon angioplasty catheter achieving approximately 60% patency. PLAN: Follow-up in the clinic 4 to 6 weeks post discharge. Electronically Signed   By: Luanne Bras M.D.   On: 01/03/2022 08:12   DG  Swallowing Func-Speech Pathology  Result Date: 01/02/2022 Table formatting from the original result was not included. Objective Swallowing Evaluation: Type of Study: MBS-Modified Barium Swallow Study  Patient Details Name: Chad [name] MRN: [id number] Date of Birth: [birth year] Today's Date: 01/02/2022 Time: SLP Start Time (ACUTE ONLY): 1100 -SLP Stop Time (ACUTE ONLY): 1123 SLP Time Calculation (min) (ACUTE ONLY): 23 min Past Medical History: Past Medical History: Diagnosis Date  Arthritis   Heart murmur   asa child   Hypertension   Stroke [facility])  Past Surgical History: Past Surgical History: Procedure Laterality Date  EYE SURGERY Bilateral 2022  JOINT REPLACEMENT    RADIOLOGY WITH ANESTHESIA N/A 01/01/2022  Procedure: Angiogram;  Surgeon: Luanne Bras, MD;  Location: [place];  Service: Radiology;  Laterality: N/A;  TONSILLECTOMY    TOTAL KNEE ARTHROPLASTY Right 03/30/2013  Procedure: RIGHT TOTAL KNEE ARTHROPLASTY;  Surgeon: Tobi Bastos, MD;  Location: [facility];  Service: Orthopedics;  Laterality: Right;  TOTAL KNEE ARTHROPLASTY Left 10/09/2020  Procedure: TOTAL KNEE ARTHROPLASTY;  Surgeon: Paralee Cancel, MD;  Location: [facility];  Service: Orthopedics;  Laterality: Left;  70 mins HPI: 88 yo male presenting to the ED on 5/28 with L sided weakness, facial droop, slurred speech, and fall. 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 PMH including hypothyroidism chronic pain syndromes, CKD, HTN, arthritis, bil TKA.  Subjective: alert, friendly  Recommendations for follow up therapy are one component of a multi-disciplinary discharge planning process, led by the attending physician.  Recommendations may be  updated based on patient status, additional functional criteria and insurance authorization. Assessment / Plan / Recommendation   01/02/2022  11:00 AM Clinical Impressions Clinical Impression Pt presents with oropharyngeal dysphagia characterized by reduced labial seal, impaired mastication, a pharyngeal delay and reduction in bolus cohesion, posterior bolus propulsion, tongue base retraction, anterior laryngeal movement, and pharyngeal constriction. He demonstrated left-sided anterior spillage, lingual residue, difficulty with A-P transport of solids, premature spillage to the valleculae and pyriform sinuses, and posterior pharyngeal wall residue. A liquid bolus was necessary to facilitate mastication and A-P transport of a nutrigrain bar. Liquid washes reduced pharyngeal residue to a more functional level and a left head turn was effective in reducing pyriform sinus residue with thin liquids, but pt exhibited notable difficulty maintaining this position. Penetration (PAS 3, 5) and aspiration (PAS 7, 8) were noted with thin liquids. Larger amounts of aspirate did trigger a cough which was mostly effective, but smaller quantities inconsistently triggered throat clearing which was inadequate for expulsion of material. No functional benefit was noted with prompted coughing. A dysphagia 1 diet with nectar thick liquids is recommended at this time. SLP will follow for dysphagia treatment. SLP Visit Diagnosis Dysphagia, oropharyngeal phase (R13.12) Impact on safety and function Mild aspiration risk     01/02/2022  11:00 AM Treatment Recommendations Treatment Recommendations Therapy as outlined in treatment plan below     01/02/2022  11:00 AM Prognosis Prognosis for Safe Diet Advancement Good   01/02/2022  11:00 AM Diet Recommendations SLP Diet Recommendations Dysphagia 1 (Puree) solids;Nectar thick liquid Liquid Administration via Cup;Straw Medication Administration Whole meds with puree Compensations Slow rate;Small  sips/bites;Monitor for anterior loss;Follow solids with liquid Postural Changes Seated upright at 90 degrees     01/02/2022  11:00 AM Other Recommendations Other Recommendations Order thickener from pharmacy Follow Up Recommendations Acute inpatient rehab (3hours/day) Assistance recommended at discharge Frequent or constant Supervision/Assistance Functional Status Assessment Patient has had a recent  decline in their functional status and demonstrates the ability to make significant improvements in function in a reasonable and predictable amount of time.   01/02/2022  11:00 AM Frequency and Duration  Speech Therapy Frequency (ACUTE ONLY) min 2x/week Treatment Duration 2 weeks     01/02/2022  11:00 AM Oral Phase Oral Phase Impaired Oral - Nectar Cup Left anterior bolus loss;Reduced posterior propulsion;Lingual/palatal residue;Delayed oral transit;Decreased bolus cohesion;Premature spillage Oral - Nectar Straw Left anterior bolus loss;Reduced posterior propulsion;Lingual/palatal residue;Delayed oral transit;Decreased bolus cohesion;Premature spillage Oral - Thin Cup Left anterior bolus loss;Reduced posterior propulsion;Lingual/palatal residue;Delayed oral transit;Decreased bolus cohesion;Premature spillage Oral - Thin Straw Left anterior bolus loss;Reduced posterior propulsion;Lingual/palatal residue;Delayed oral transit;Decreased bolus cohesion;Premature spillage Oral - Puree Left anterior bolus loss;Reduced posterior propulsion;Lingual/palatal residue;Delayed oral transit;Decreased bolus cohesion;Weak lingual manipulation Oral - Mech Soft Left anterior bolus loss;Reduced posterior propulsion;Lingual/palatal residue;Delayed oral transit;Decreased bolus cohesion;Weak lingual manipulation;Impaired mastication Oral - Pill Left anterior bolus loss;Reduced posterior propulsion;Lingual/palatal residue;Delayed oral transit;Decreased bolus cohesion    01/02/2022  11:00 AM Pharyngeal Phase Pharyngeal Phase Impaired Pharyngeal- Thin  Cup Reduced tongue base retraction;Reduced anterior laryngeal mobility;Delayed swallow initiation-pyriform sinuses;Reduced pharyngeal peristalsis;Penetration/Aspiration during swallow;Penetration/Apiration after swallow;Pharyngeal residue - valleculae;Pharyngeal residue - pyriform;Pharyngeal residue - posterior pharnyx Pharyngeal Material enters airway, remains ABOVE vocal cords and not ejected out;Material enters airway, CONTACTS cords and not ejected out;Material enters airway, passes BELOW cords and not ejected out despite cough attempt by patient;Material enters airway, passes BELOW cords without attempt by patient to eject out (silent aspiration) Pharyngeal- Thin Straw Reduced tongue base retraction;Reduced anterior laryngeal mobility;Delayed swallow initiation-pyriform sinuses;Reduced pharyngeal peristalsis;Penetration/Aspiration during swallow;Penetration/Apiration after swallow;Pharyngeal residue - valleculae;Pharyngeal residue - pyriform;Pharyngeal residue - posterior pharnyx Pharyngeal Material enters airway, passes BELOW cords and not ejected out despite cough attempt by patient;Material enters airway, passes BELOW cords without attempt by patient to eject out (silent aspiration);Material enters airway, CONTACTS cords and then ejected out Pharyngeal- Puree Reduced tongue base retraction;Reduced anterior laryngeal mobility;Delayed swallow initiation-pyriform sinuses;Reduced pharyngeal peristalsis;Pharyngeal residue - valleculae;Pharyngeal residue - pyriform;Pharyngeal residue - posterior pharnyx Pharyngeal- Mechanical Soft Reduced tongue base retraction;Reduced anterior laryngeal mobility;Delayed swallow initiation-pyriform sinuses;Reduced pharyngeal peristalsis;Pharyngeal residue - valleculae;Pharyngeal residue - pyriform;Pharyngeal residue - posterior pharnyx Pharyngeal- Pill Reduced tongue base retraction;Reduced anterior laryngeal mobility;Delayed swallow initiation-pyriform sinuses;Reduced pharyngeal  peristalsis;Pharyngeal residue - valleculae;Pharyngeal residue - pyriform;Pharyngeal residue - posterior pharnyx    01/02/2022  11:00 AM Cervical Esophageal Phase  Cervical Esophageal Phase [facility] Shanika I. Hardin Negus, [place], [place] Office number [phone] Pager [phone] [place] 01/02/2022, 12:20 PM                     VAS US CAROTID  Result Date: 12/31/2021 Carotid Arterial Duplex Study Patient Name:  Chad [name]  Date of Exam:   12/30/2021 Medical Rec #: [id number]          Accession #:    [id number] Date of Birth: 1932/06/11           Patient Gender: M Patient Age:   27 years Exam Location:  [facility] Procedure:      VAS US CAROTID Referring Phys: Nyoka Lint DOUTOVA --------------------------------------------------------------------------------  Indications:       CVA. Risk Factors:      Hypertension. Comparison Study:  No prior study Performing Technologist: Maudry Mayhew MHA, RDMS, RVT, RDCS  Examination Guidelines: A complete evaluation includes B-mode imaging, spectral Doppler, color Doppler, and power Doppler as needed of all accessible portions of each vessel. Bilateral testing is considered  an integral part of a complete examination. Limited examinations for reoccurring indications may be performed as noted.  Right Carotid Findings: +----------+-------+--------+--------+-------------------------------+---------+           PSV    EDV cm/sStenosisPlaque Description             Comments            cm/s                                                            +----------+-------+--------+--------+-------------------------------+---------+ CCA Prox  54     7               heterogenous and irregular               +----------+-------+--------+--------+-------------------------------+---------+ CCA Distal58     9               smooth and heterogenous                   +----------+-------+--------+--------+-------------------------------+---------+ ICA Prox  132    19              smooth, heterogenous and       Shadowing                                  calcific                                 +----------+-------+--------+--------+-------------------------------+---------+ ICA Distal78     15                                                       +----------+-------+--------+--------+-------------------------------+---------+ ECA       126                                                             +----------+-------+--------+--------+-------------------------------+---------+ +----------+--------+-------+----------------+-------------------+           PSV cm/sEDV cmsDescribe        Arm Pressure (mmHG) +----------+--------+-------+----------------+-------------------+ [id number]            Multiphasic, WNL                    +----------+--------+-------+----------------+-------------------+ +---------+--------+--+--------+-+---------+ VertebralPSV cm/s51EDV cm/s5Antegrade +---------+--------+--+--------+-+---------+  Left Carotid Findings: +----------+--------+--------+--------+------------------------------+---------+           PSV cm/sEDV cm/sStenosisPlaque Description            Comments  +----------+--------+--------+--------+------------------------------+---------+ CCA Prox  89                      heterogenous and irregular    tortuous  +----------+--------+--------+--------+------------------------------+---------+ CCA Distal70      8                                                       +----------+--------+--------+--------+------------------------------+---------+  ICA Prox  49      12              heterogenous, irregular and   Shadowing                                   calcific                                 +----------+--------+--------+--------+------------------------------+---------+ ICA Distal98      18                                                      +----------+--------+--------+--------+------------------------------+---------+ ECA       67      10                                                      +----------+--------+--------+--------+------------------------------+---------+ +----------+--------+--------+----------------+-------------------+           PSV cm/sEDV cm/sDescribe        Arm Pressure (mmHG) +----------+--------+--------+----------------+-------------------+ [id number]             Multiphasic, WNL                    +----------+--------+--------+----------------+-------------------+ +---------+--------+--+--------+--+---------+ VertebralPSV cm/s94EDV cm/s14Antegrade +---------+--------+--+--------+--+---------+   Summary: Right Carotid: Velocities in the right ICA are consistent with a 1-39% stenosis. Left Carotid: Velocities in the left ICA are consistent with a 1-39% stenosis. Vertebrals:  Bilateral vertebral arteries demonstrate antegrade flow. Subclavians: Normal flow hemodynamics were seen in bilateral subclavian              arteries. *See table(s) above for measurements and observations.  Electronically signed by Antony Contras MD on 12/31/2021 at 3:59:42 PM.    Final    ECHOCARDIOGRAM COMPLETE  Result Date: 12/30/2021    ECHOCARDIOGRAM REPORT   Patient Name:   Chad [name] Date of Exam: 12/30/2021 Medical Rec #:  [id number]         Height:       66.0 in Accession #:    [id number]        Weight:       133.6 lb Date of Birth:  1932-03-08          BSA:          1.685 m Patient Age:    77 years          BP:           161/84 mmHg Patient Gender: M                 HR:           66 bpm. Exam Location:  Inpatient Procedure: 2D Echo Indications:    stroke  History:        Patient has prior history of Echocardiogram examinations, most                  recent 04/25/2020. Chronic kidney disease.; Risk  Factors:Hypertension and Dyslipidemia.  Sonographer:    Johny Chess RDCS Referring Phys: [facility]  1. Left ventricular ejection fraction, by estimation, is 55 to 60%. The left ventricle has normal function. The left ventricle has no regional wall motion abnormalities. There is mild left ventricular hypertrophy. Left ventricular diastolic parameters are consistent with Grade I diastolic dysfunction (impaired relaxation).  2. Right ventricular systolic function is normal. The right ventricular size is normal.  3. Left atrial size was mildly dilated.  4. The mitral valve is normal in structure. Mild mitral valve regurgitation. No evidence of mitral stenosis.  5. The aortic valve is tricuspid. Aortic valve regurgitation is mild. Aortic valve sclerosis/calcification is present, without any evidence of aortic stenosis.  6. The inferior vena cava is normal in size with greater than 50% respiratory variability, suggesting right atrial pressure of 3 mmHg. FINDINGS  Left Ventricle: Left ventricular ejection fraction, by estimation, is 55 to 60%. The left ventricle has normal function. The left ventricle has no regional wall motion abnormalities. The left ventricular internal cavity size was normal in size. There is  mild left ventricular hypertrophy. Left ventricular diastolic parameters are consistent with Grade I diastolic dysfunction (impaired relaxation). Right Ventricle: The right ventricular size is normal. Right ventricular systolic function is normal. Left Atrium: Left atrial size was mildly dilated. Right Atrium: Right atrial size was normal in size. Pericardium: Trivial pericardial effusion is present. Mitral Valve: The mitral valve is normal in structure. Mild mitral annular calcification. Mild mitral valve regurgitation. No evidence of mitral valve stenosis. Tricuspid Valve: The tricuspid valve is normal in structure.  Tricuspid valve regurgitation is trivial. No evidence of tricuspid stenosis. Aortic Valve: The aortic valve is tricuspid. Aortic valve regurgitation is mild. Aortic regurgitation PHT measures 400 msec. Aortic valve sclerosis/calcification is present, without any evidence of aortic stenosis. Pulmonic Valve: The pulmonic valve was normal in structure. Pulmonic valve regurgitation is trivial. No evidence of pulmonic stenosis. Aorta: The aortic root is normal in size and structure. Venous: The inferior vena cava is normal in size with greater than 50% respiratory variability, suggesting right atrial pressure of 3 mmHg. IAS/Shunts: No atrial level shunt detected by color flow Doppler.  LEFT VENTRICLE PLAX 2D LVIDd:         5.20 cm   Diastology LVIDs:         3.10 cm   LV e' medial:    4.50 cm/s LV PW:         1.20 cm   LV E/e' medial:  12.0 LV IVS:        1.10 cm   LV e' lateral:   4.20 cm/s LVOT diam:     2.10 cm   LV E/e' lateral: 12.9 LV SV:         80 LV SV Index:   47 LVOT Area:     3.46 cm  RIGHT VENTRICLE             IVC RV S prime:     15.70 cm/s  IVC diam: 1.00 cm TAPSE (M-mode): 2.0 cm LEFT ATRIUM             Index        RIGHT ATRIUM           Index LA diam:        3.40 cm 2.02 cm/m   RA Area:     16.30 cm LA Vol (A2C):   79.2 ml 47.01 ml/m  RA Volume:  39.80 ml  23.62 ml/m LA Vol (A4C):   47.5 ml 28.20 ml/m LA Biplane Vol: 63.0 ml 37.40 ml/m  AORTIC VALVE LVOT Vmax:   102.00 cm/s LVOT Vmean:  67.600 cm/s LVOT VTI:    0.230 m AI PHT:      400 msec  AORTA Ao Root diam: 3.70 cm Ao Asc diam:  3.10 cm MITRAL VALVE MV Area (PHT): 4.60 cm    SHUNTS MV Decel Time: 165 msec    Systemic VTI:  0.23 m MR Peak grad: 134.6 mmHg   Systemic Diam: 2.10 cm MR Mean grad: 82.0 mmHg MR Vmax:      580.00 cm/s MR Vmean:     416.0 cm/s MV E velocity: 54.00 cm/s MV A velocity: 96.40 cm/s MV E/A ratio:  0.56 Brian Crenshaw MD Electronically signed by Brian Crenshaw MD Signature Date/Time: 12/30/2021/2:40:23 PM    Final    CT  ANGIO HEAD NECK W WO CM  Result Date: 12/30/2021 CLINICAL DATA:  Stroke/TIA, determine embolic source EXAM: CT ANGIOGRAPHY HEAD AND NECK TECHNIQUE: Multidetector CT imaging of the head and neck was performed using the standard protocol during bolus administration of intravenous contrast. Multiplanar CT image reconstructions and MIPs were obtained to evaluate the vascular anatomy. Carotid stenosis measurements (when applicable) are obtained utilizing NASCET criteria, using the distal internal carotid diameter as the denominator. RADIATION DOSE REDUCTION: This exam was performed according to the departmental dose-optimization program which includes automated exposure control, adjustment of the mA and/or kV according to patient size and/or use of iterative reconstruction technique. CONTRAST:  80mL OMNIPAQUE IOHEXOL 350 MG/ML SOLN COMPARISON:  Same day MRI/MRA. FINDINGS: CT HEAD FINDINGS Brain: Small right MCA territory infarcts better characterized on same day MRI. No evidence of acute hemorrhage, mass effect, midline shift, extra-axial fluid collection, or hydrocephalus. Patchy white matter hypodensities, nonspecific but compatible with chronic microvascular ischemic disease. Vascular: Detailed below. Skull: No acute fracture. Sinuses: Mild paranasal sinus mucosal thickening. Orbits: No acute orbital findings. Review of the MIP images confirms the above findings CTA NECK FINDINGS Aortic arch: Calcific atherosclerosis of the aorta and great vessel origins. Ectatic right subclavian artery. Right carotid system: Atherosclerosis at the carotid bifurcation with approximately 80% stenosis. Left carotid system: Atherosclerosis at the carotid bifurcation without greater than 50% stenosis. Vertebral arteries: Potentially severe stenosis of the right vertebral artery origin due to calcific atherosclerosis. Otherwise, vertebral arteries are patent without significant (greater than 50%) stenosis. Left dominant. Skeleton: Severe  multilevel degenerative change. Other neck: No acute findings. Upper chest: Clear sinuses. Review of the MIP images confirms the above findings CTA HEAD FINDINGS Anterior circulation: Bilateral intracranial ICAs are patent with mild narrowing due to calcific atherosclerosis. Bilateral M1 MCAs and left M2 MCAs are patent. Apparent occlusion of a right M2 MCA branch in a region of calcification. Bilateral ACAs are patent. Posterior circulation: Left dominant intradural vertebral artery. Right intradural vertebral artery makes very small contribution to the basilar artery. Basilar artery and bilateral posterior cerebral arteries are patent without proximal hemodynamically significant stenosis. Venous sinuses: As permitted by contrast timing, patent. Anatomic variants: Detailed above. Review of the MIP images confirms the above findings IMPRESSION: CTA: 1. 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 2. Extensive calcific atherosclerosis at the right carotid bifurcation with approximately 80% stenosis of the proximal ICA. 3. Potentially severe stenosis of the non dominant right vertebral   artery origin. CT head: Small right MCA territory infarcts better characterized on same day MRI. No evidence of progressive mass effect or acute hemorrhage. Findings discussed with Dr.  via telephone at 12:50 p.m. Electronically Signed   By: Aayush S Jones M.D.   On: 12/30/2021 13:00   MR BRAIN WO CONTRAST  Result Date: 12/30/2021 CLINICAL DATA:  There are deficit with acute stroke suspected EXAM: MRI HEAD WITHOUT CONTRAST MRA HEAD WITHOUT CONTRAST TECHNIQUE: Multiplanar, multi-echo pulse sequences of the brain and surrounding structures were acquired without intravenous contrast. Angiographic images of the Circle of Willis were acquired using MRA technique without  intravenous contrast. COMPARISON:  Head CT from yesterday FINDINGS: MRI HEAD FINDINGS Brain: Cluster of small acute infarcts in the right insular cortex, right frontal cortex, and deep right cerebral white matter. These are in a MCA distribution. Background of advanced chronic small vessel ischemia with confluent gliosis in the hemispheric white matter. Chronic lacunar infarcts in the deep cerebrum. Small remote left cerebral infarct. Gradient signal at the lower right sylvian fissure with there is calcification along the MCA branches by CT. Vascular: Major flow voids are preserved Skull and upper cervical spine: Normal marrow signal Sinuses/Orbits: Bilateral cataract resection MRA HEAD FINDINGS Anterior circulation: High-grade narrowing involving the upper right M2 branch with there is a gradient hypointensity noted above. Elsewhere vessels are smoothly contoured and widely patent. Posterior circulation: Vertebrobasilar arteries are smoothly contoured and widely patent. IMPRESSION: Brain MRI: 1. Cluster of small cortical and white matter infarcts within the right MCA distribution. 2. Background of advanced chronic small vessel ischemia. Intracranial MRA: Focal high-grade narrowing at the right M2 level where there is calcification by CT. This could reflect calcified plaque or a calcified embolus. If an embolus, the proximal nature and degree of mild infarct with suggest additional territory of risk. Electronically Signed   By: Jonathan  Watts M.D.   On: 12/30/2021 07:53   MR ANGIO HEAD WO CONTRAST  Result Date: 12/30/2021 CLINICAL DATA:  There are deficit with acute stroke suspected EXAM: MRI HEAD WITHOUT CONTRAST MRA HEAD WITHOUT CONTRAST TECHNIQUE: Multiplanar, multi-echo pulse sequences of the brain and surrounding structures were acquired without intravenous contrast. Angiographic images of the Circle of Willis were acquired using MRA technique without intravenous contrast. COMPARISON:  Head CT from  yesterday FINDINGS: MRI HEAD FINDINGS Brain: Cluster of small acute infarcts in the right insular cortex, right frontal cortex, and deep right cerebral white matter. These are in a MCA distribution. Background of advanced chronic small vessel ischemia with confluent gliosis in the hemispheric white matter. Chronic lacunar infarcts in the deep cerebrum. Small remote left cerebral infarct. Gradient signal at the lower right sylvian fissure with there is calcification along the MCA branches by CT. Vascular: Major flow voids are preserved Skull and upper cervical spine: Normal marrow signal Sinuses/Orbits: Bilateral cataract resection MRA HEAD FINDINGS Anterior circulation: High-grade narrowing involving the upper right M2 branch with there is a gradient hypointensity noted above. Elsewhere vessels are smoothly contoured and widely patent. Posterior circulation: Vertebrobasilar arteries are smoothly contoured and widely patent. IMPRESSION: Brain MRI: 1. Cluster of small cortical and white matter infarcts within the right MCA distribution. 2. Background of advanced chronic small vessel ischemia. Intracranial MRA: Focal high-grade narrowing at the right M2 level where there is calcification by CT. This could reflect calcified plaque or a calcified embolus. If an embolus, the proximal nature and degree of mild infarct with suggest additional territory of risk. Electronically Signed   By:   Jonathan  Watts M.D.   On: 12/30/2021 07:53   DG Chest Portable 1 View  Result Date: 12/29/2021 CLINICAL DATA:  CVA. Fell last night. Family concerned patient injured left side EXAM: PORTABLE CHEST 1 VIEW COMPARISON:  Chest two views 03/23/2013 FINDINGS: Cardiac silhouette is mildly enlarged. Moderate calcifications within the aortic arch and descending thoracic aorta. Minimal bibasilar horizontal linear chronic scarring is unchanged. No focal airspace opacity to indicate pneumonia. No pleural effusion or pneumothorax. Moderate  multilevel degenerative disc changes of the thoracic spine. IMPRESSION: Mild stable cardiomegaly. No acute lung process. Electronically Signed   By: Ronald  Viola M.D.   On: 12/29/2021 19:19   DG Hand Complete Left  Result Date: 12/29/2021 CLINICAL DATA:  Fall.  Bruising to posterior left hand. EXAM: LEFT HAND - COMPLETE 3+ VIEW COMPARISON:  None Available. FINDINGS: There is diffuse decreased bone mineralization. A pulse oximeter overlies the distal index finger obscuring portions. Degenerative changes including joint space narrowing, subchondral sclerosis and peripheral osteophytosis are moderate at the triscaphe joint; moderate to severe at the thumb carpometacarpal, thumb metacarpophalangeal, and thumb interphalangeal joints; and moderate to severe at the DIP joints and moderate at the PIP joints of the second through fifth fingers. No acute fracture is seen. No dislocation. IMPRESSION: Osteoarthritis as above. No acute fracture. Electronically Signed   By: Ronald  Viola M.D.   On: 12/29/2021 18:12   DG Elbow Complete Left  Result Date: 12/29/2021 CLINICAL DATA:  Fall.  Left elbow pain and bruising. EXAM: LEFT ELBOW - COMPLETE 3+ VIEW COMPARISON:  None Available. FINDINGS: No fracture or dislocation. Narrowed radiocapitellar joint. Marginal osteophytes project from the base of the radial head. No joint effusion. There is soft tissue edema posteriorly. IMPRESSION: No fracture or dislocation. Electronically Signed   By: David  Ormond M.D.   On: 12/29/2021 18:12   CT Cervical Spine Wo Contrast  Result Date: 12/29/2021 CLINICAL DATA:  There logic deaf sick, neck trauma EXAM: CT CERVICAL SPINE WITHOUT CONTRAST TECHNIQUE: Multidetector CT imaging of the cervical spine was performed without intravenous contrast. Multiplanar CT image reconstructions were also generated. RADIATION DOSE REDUCTION: This exam was performed according to the departmental dose-optimization program which includes automated exposure  control, adjustment of the mA and/or kV according to patient size and/or use of iterative reconstruction technique. COMPARISON:  None Available. FINDINGS: Alignment: Mild degenerative anterolisthesis of C2 and C3. Otherwise alignment is anatomic. Skull base and vertebrae: No acute fracture. No primary bone lesion or focal pathologic process. Soft tissues and spinal canal: No prevertebral fluid or swelling. No visible canal hematoma. Disc levels: Partial bony fusion across the disc spaces at C3-4 and C4-5. There is severe multilevel spondylosis throughout the remainder of the cervical spine, greatest at C5-6 and C6-7. Diffuse facet hypertrophy. Upper chest: Airway is patent. Lung apices are clear. Prominent atherosclerosis of the aortic arch. Other: Reconstructed images demonstrate no additional findings. IMPRESSION: 1. Extensive multilevel cervical degenerative changes. No acute fracture. Electronically Signed   By: Michael  Brown M.D.   On: 12/29/2021 17:38   CT HEAD WO CONTRAST  Result Date: 12/29/2021 CLINICAL DATA:  Neurologic deficit EXAM: CT HEAD WITHOUT CONTRAST TECHNIQUE: Contiguous axial images were obtained from the base of the skull through the vertex without intravenous contrast. RADIATION DOSE REDUCTION: This exam was performed according to the departmental dose-optimization program which includes automated exposure control, adjustment of the mA and/or kV according to patient size and/or use of iterative reconstruction technique. COMPARISON:  None Available. FINDINGS: Brain: Confluent   hypodensities throughout the periventricular white matter are most consistent with age-indeterminate small vessel ischemic changes, likely chronic. Focal hypodensities in the left basal ganglia and right frontal periventricular white matter consistent with chronic lacunar infarcts. Age-indeterminate lacunar infarct is seen within the right basal ganglia image 18/3. No other signs of acute infarct or hemorrhage. Lateral  ventricles and midline structures are otherwise unremarkable. No acute extra-axial fluid collections. No mass effect. Vascular: No hyperdense vessel or unexpected calcification. Skull: Normal. Negative for fracture or focal lesion. Sinuses/Orbits: No acute finding. Other: None. IMPRESSION: 1. Age indeterminate lacunar infarct within the right basal ganglia. 2. Age-indeterminate small-vessel ischemic changes throughout the periventricular white matter, favor chronic. 3. Chronic left basal ganglia and right frontal white matter lacunar infarcts as above. 4. No acute hemorrhage. Electronically Signed   By: Randa Ngo M.D.   On: 12/29/2021 17:35     PHYSICAL EXAM  Temp:  [97.4 F (36.3 C)-98.5 F (36.9 C)] 98.2 F (36.8 C) (06/18 0700) Pulse Rate:  [60-89] 75 (06/18 0900) Resp:  [13-35] 16 (06/18 0900) BP: (84-179)/(51-116) 123/76 (06/18 0900) SpO2:  [95 %-100 %] 100 % (06/18 0900) FiO2 (%):  [30 %] 30 % (06/18 0819) Weight:  [68.8 kg] 68.8 kg (06/18 0408)  General - Well nourished, well developed, intubated on precedex.  Ophthalmologic - fundi not visualized due to noncooperation.  Cardiovascular - Regular rate and rhythm.  Neuro - intubated on precedex, eyes closed but open on voice, not following commands. With eye opening, eyes in mid position, inconsistently blinking to visual threat, doll's eyes present, not tracking.  Corneal reflex present, gag and cough present. Breathing over the vent.  Facial symmetry not able to test due to ET tube.  Tongue protrusion not cooperative. Spontaneous moving right UE against gravity and LE with knee flexion, but left upper extremity flaccid, left lower extremity flicker toe movement with painful stimuli. DTR diminished and no babinski. Sensation, coordination not cooperative and gait not tested.    ASSESSMENT/PLAN Chad [name] is a 88 y.o. male with history of hypertension, hyperlipidemia, hearing loss, recent stroke status post right  carotid angioplasty transfer back from CIR due to aspiration, respite failure.  Found to have severe anemia with GI bleeding, renal failure and seizure-like activity.  Stroke:  right MCA infarct extended from recent infarct, likely secondary to hypertension in the setting of sepsis and GI bleeding with known right ICA stenosis s/p angioplasty  Recent stroke: right MCA scattered small infarcts due to right M2 occlusion by calcified plaque,  secondary to large vessel disease source from right ICA high-grade stenosis CT 5/28 no acute abnormality, old bilateral BG, right frontal white matter lacunar infarcts. MRA right M 2 high-grade stenosis MRI 5/29 right MCA scattered small infarcts CT head and neck 5/29 proximal right M2 occlusion due to calcified embolus. Extensive calcified atherosclerosis at right ICA bifurcation with 80% stenosis.  Severe stenosis nondominant right VA origin.  CT head 6/15 - moderate-sized acute/subacute cortical and subcortical infarct within the right frontoparietal lobes and right insula  MRI and MRA pending CUS repeat pending 2D Echo EF 55 to 60% LDL 90 HgbA1c 6.1 aspirin 81 mg daily and Brilinta (ticagrelor) 90 mg bid prior to admission, now on aspirin 81 mg daily and Brilinta (ticagrelor) 90 mg bid after anemia stabilized Ongoing aggressive stroke risk factor management Therapy recommendations:  pending Disposition: Pending  Seizure like activity 6/15 episode of left gaze, right upper extremity posturing, status post Ativan EEG stat no seizure,  moderate to severe diffuse encephalopathy Off LTM  Respiratory failure Aspiration pneumonia on Unasyn CCM on board Intubated for airway protection Vent management per CCM  Carotid stenosis CT head and neck 5/29 extensive calcified atherosclerosis at right ICA bifurcation with 80% stenosis. Likely the source for right M2 occlusion and right MCA stroke Dr. Estanislado Pandy did right carotid angioplasty 5/31, not able to  have stent placement due to tortuosity of the vessel Plan to follow up with Dr. Estanislado Pandy in 1-2 months, if re-stenosis, may need to consider right CEA CUS repeat pending   GIB Large dark stool 6/15 Severe anemia Hb 8.2->8.0->5.6->PRBC->9.9-> 9.5->9.5 GI on board EGD 6/16 found food in stomach and no significance and source of bleeding Close monitoring  History of hypertension Hypotension, improved Likely due to septic shock and severe anemia On IV fluid, received PRBC transfusion Off Levophed BP goal normotensive   Hyperlipidemia Home meds: Lovastatin 40 LDL 90, goal < 70 Now on Crestor 20 Continue statin at discharge   AKI on CKD Creatinine 1.3->3.84->3.24->3.12->2.95 CCM on board On TF @ 50 Avoid nephrotoxicity agents  Dysphagia On tube feeding now  Other Stroke Risk Factors Advanced age   Other Active Problems Hard of hearing  Hospital day # 4  This patient is critically ill due to right MCA infarct extension, right ICA stenosis, respiratory failure, intubated on vent, aspiration pneumonia, GIB with severe anemia and at significant risk of neurological worsening, death form recurrent stroke, hemorrhagic conversion, severe anemia, respiratory failure. This patient's care requires constant monitoring of vital signs, hemodynamics, respiratory and cardiac monitoring, review of multiple databases, neurological assessment, discussion with family, other specialists and medical decision making of high complexity. I spent 40 minutes of neurocritical care time in the care of this patient. I discussed with Dr. Lake Bells.   Rosalin Hawking, MD PhD Stroke Neurology 01/19/2022 10:18 AM      To contact Stroke Continuity provider, please refer to [email or web address] After hours, contact General Neurology

## 2022-01-19 NOTE — Progress Notes (Signed)
  Daily Progress Note   Patient Name: Chad [name]       Date: 01/19/2022 DOB: [birth year]  Age: 88 y.o. MRN#: [id number] Attending Physician: Juanito Doom, MD Primary Care Physician: Chad [name], MD Admit Date: 01/22/2022 Length of Stay: 4 days  Reason for Consultation/Follow-up: {Reason for Consult:23484}  HPI/Patient Profile:  ***  Subjective:   Subjective: Chart Reviewed. Updates received. Patient Assessed. Created space and opportunity for patient  and family to explore thoughts and feelings regarding current medical situation.  Today's Discussion: ***  Review of Systems  Objective:   Vital Signs:  BP 123/76   Pulse 75   Temp 98.9 F (37.2 C) (Oral)   Resp 16   Wt 68.8 kg   SpO2 100%   BMI 28.66 kg/m   Physical Exam: Physical Exam  Palliative Assessment/Data: ***   Assessment & Plan:   Impression: Present on Admission:  Acute respiratory failure with hypoxia (HCC)  ***  SUMMARY OF RECOMMENDATIONS   ***  Symptom Management:  ***  Code Status: {Palliative Code status:23503}  Prognosis: {Palliative Care Prognosis:23504}  Discharge Planning: {Palliative dispostion:23505}  Discussed with: ***  Thank you for allowing [place] to participate in the care of Chad [name] PMT will continue to support holistically.  Time Total: ***  Visit consisted of counseling and education dealing with the complex and emotionally intense issues of symptom management and palliative care in the setting of serious and potentially life-threatening illness. Greater than 50%  of this time was spent counseling and coordinating care related to the above assessment and plan.  Chad Field, NP Palliative Medicine Team  Team Phone # [phone] (Nights/Weekends)  04/02/2021, 8:17 AM

## 2022-01-19 NOTE — Progress Notes (Signed)
Right carotid artery duplex has been completed. Preliminary results can be found in CV Proc through chart review.   01/19/22 3:29 PM Carlos Levering RVT

## 2022-01-20 ENCOUNTER — Encounter (HOSPITAL_COMMUNITY): Payer: Self-pay | Admitting: Gastroenterology

## 2022-01-20 LAB — BASIC METABOLIC PANEL
Anion gap: 16 — ABNORMAL HIGH (ref 5–15)
BUN: 61 mg/dL — ABNORMAL HIGH (ref 8–23)
CO2: 25 mmol/L (ref 22–32)
Calcium: 7.7 mg/dL — ABNORMAL LOW (ref 8.9–10.3)
Chloride: 109 mmol/L (ref 98–111)
Creatinine, Ser: 3.06 mg/dL — ABNORMAL HIGH (ref 0.61–1.24)
GFR, Estimated: 19 mL/min — ABNORMAL LOW (ref >60)
Glucose, Bld: 121 mg/dL — ABNORMAL HIGH (ref 70–99)
Potassium: 4 mmol/L (ref 3.5–5.1)
Sodium: 150 mmol/L — ABNORMAL HIGH (ref 135–145)

## 2022-01-20 LAB — CULTURE, BLOOD (ROUTINE X 2)
Culture: NO GROWTH
Culture: NO GROWTH

## 2022-01-20 LAB — GLUCOSE, CAPILLARY
Glucose-Capillary: 103 mg/dL — ABNORMAL HIGH (ref 70–99)
Glucose-Capillary: 110 mg/dL — ABNORMAL HIGH (ref 70–99)
Glucose-Capillary: 111 mg/dL — ABNORMAL HIGH (ref 70–99)
Glucose-Capillary: 113 mg/dL — ABNORMAL HIGH (ref 70–99)
Glucose-Capillary: 115 mg/dL — ABNORMAL HIGH (ref 70–99)
Glucose-Capillary: 120 mg/dL — ABNORMAL HIGH (ref 70–99)

## 2022-01-20 LAB — MAGNESIUM: Magnesium: 2 mg/dL (ref 1.7–2.4)

## 2022-01-20 LAB — PHOSPHORUS: Phosphorus: 4.3 mg/dL (ref 2.5–4.6)

## 2022-01-20 MED ORDER — PANTOPRAZOLE 2 MG/ML SUSPENSION
40.0000 mg | Freq: Two times a day (BID) | ORAL | Status: DC
Start: 2022-01-20 — End: 2022-01-26
  Administered 2022-01-20 – 2022-01-26 (×13): 40 mg
  Filled 2022-01-20 (×14): qty 20

## 2022-01-20 MED ORDER — FENTANYL CITRATE (PF) 100 MCG/2ML IJ SOLN
25.0000 ug | INTRAMUSCULAR | Status: DC | PRN
  Administered 2022-01-20: 25 ug via INTRAVENOUS
  Filled 2022-01-20: qty 2

## 2022-01-20 MED ORDER — ONDANSETRON HCL 4 MG/2ML IJ SOLN
4.0000 mg | Freq: Four times a day (QID) | INTRAMUSCULAR | Status: DC | PRN
  Administered 2022-01-21 – 2022-01-25 (×2): 4 mg via INTRAVENOUS
  Filled 2022-01-20 (×2): qty 2

## 2022-01-20 MED ORDER — FREE WATER
100.0000 mL | Freq: Three times a day (TID) | Status: DC
  Administered 2022-01-20 – 2022-01-21 (×4): 100 mL

## 2022-01-20 MED ORDER — ORAL CARE MOUTH RINSE
15.0000 mL | OROMUCOSAL | Status: DC
  Administered 2022-01-21 – 2022-01-28 (×28): 15 mL via OROMUCOSAL

## 2022-01-20 NOTE — Progress Notes (Signed)
April called to arrange for family meeting. Time set for 2pm 6/19.  Encouraged all siblings to attend.     Noe Gens, MSN, APRN, NP-C, AGACNP-BC [facility] 01/20/2022, 11:53 AM   Please see [email or web address] for pager details.   From 7A-7P if no response, please call [phone] After hours, please call ELink [phone]

## 2022-01-20 NOTE — Progress Notes (Signed)
RT was called by RN of pt, stating that pt had self extubated. RT at bedside to find pt on 2L [place] with SVS and tolerating well at this time. No complications and no stridor noted. RT will continue to motor pt.

## 2022-01-20 NOTE — Progress Notes (Signed)
NAME:  Chad [name], MRN:  [id number], DOB:  May 14, 1932, LOS: 5 ADMISSION DATE:  01/14/2022, CONSULTATION DATE:  6/14 REFERRING MD:  Sloan Leiter, CHIEF COMPLAINT:  Dyspnea after aspiration   History of Present Illness:  88 y/o male admitted for stroke on 5/28, had angioplasty of R ICA, then moved to rehab on 6/1.  At rehab had recurrent aspiration.  Admitted to ICU on 6/14 in setting of aspiration of tube feeds leading to acute respiratory failure with hypoxemia.    Pertinent  Medical History  Arthritis Heart murmur Hypertension Stroke  Significant Hospital Events: Including procedures, antibiotic start and stop dates in addition to other pertinent events   6/14 Admit from CIR, intubated upon arrival to ICU, concern for GI bleed, shock 6/15 convulsive movements of R arm, started LTM EEG, head CT findings worrisome for new stroke 6/16 EGD >> gastric polyps noted, erythema of gastric antrum, no ulcer, food in esophagus and stomach 6/17 palliative care and pulmonary spoke to patient's family>  6/18 MRI Brain >> acute/subacute non-hemorrhagic infarct involving the right frontal operculum, right middle frontal gyrus, left frontal lobe and right occipital lobe, remote lacunar infarcts of the basal ganglia & thalami bilaterally & in cerebellum, atrophy and white matter disease moderately advanced for age 35/18 MRA Brain >> signal loss at the site of the previous right superior division M2 occlusion, flow is distal to signal loss.  This likely represents a residual high grade stenosis, otherwise normal MRA circle of Willis 6/19 Reports of vomiting yesterday, none overnight. TF restarted 0400.   Interim History / Subjective:  Afebrile  BC pending Vent PEEP 5 / fiO2 30% I/O 1.7L UOP, emesis 1.4L, -3L in last 24 hours  RN reports no vomiting overnight, TF restarted at 0400  Objective   Blood pressure (!) 120/49, pulse (!) 58, temperature 98.1 F (36.7 C), temperature source Oral, resp. rate 16,  weight 68.8 kg, SpO2 100 %.    Vent Mode: PRVC FiO2 (%):  [30 %] 30 % Set Rate:  [16 bmp] 16 bmp Vt Set:  [420 mL] 420 mL PEEP:  [5 cmH20] 5 cmH20 Pressure Support:  [10 cmH20] 10 cmH20 Plateau Pressure:  [17 cmH20-19 cmH20] 17 cmH20   Intake/Output Summary (Last 24 hours) at 01/20/2022 0743 Last data filed at 01/20/2022 0600 Gross per 24 hour  Intake 461 ml  Output 3480 ml  Net -3019 ml   Filed Weights   01/18/22 0500 01/19/22 0408  Weight: 66.8 kg 68.8 kg    Examination: General: critically ill elderly adult male lying in bed on vent in NAD HEENT: MM pink/moist, ETT, pupils 80mm briskly reactive, arcus senilis  Neuro: on precedex at 0.4 mcg, opens eyes to voice, grimaces to eye opening for pupillary exam, minimal spontaneous movement of right side noted, none on left, does not follow commands CV: s1s2 RRR, no m/r/g PULM: non-labored at rest with good air entry, clear bilaterally  GI: soft, bsx4 active  Extremities: warm/dry, dependent upper extremity edema, R>L Skin: no rashes or lesions   Resolved Hospital Problem list     Assessment & Plan:   Acute respiratory failure with hypoxemia due to aspiration pneumonia, NOS Aspiration PNA  Not ready for extubation given unclear ability to protect airway and need definitive goals of care from family before considering extubation, high risk reintubation. Completed unasyn 6/18 for aspiration PNA. -PRVC 8cc/kg  -wean PEEP / fiO2 for sats >90% -D6 intubation  -follow intermittent CXR  -daily WUA/SBT but mental status  and goals of care establishment preclude extubation   AKI on CKD IIIa due to ATN Hyperkalemia, Hypernatremia  Anasarca 6/17 Bladder Outlet Obstruction -hold further lasix 6/19 with rise in sr cr  -free water 154ml Q8 for hypernatremia  -Trend BMP / urinary output -Replace electrolytes as indicated -Avoid nephrotoxic agents, ensure adequate renal perfusion -continue behanecol  -continue foley catheter  -renal  function concerning, particularly with prolonged intubation  GI Bleed  Large melanotic stool on 6/14), no clear etiology defined by EGD on 6/16; no ongoing bleeding 6/17 after starting ASA, brillinta for new stroke -follow for further bleeding  -follow Hgb trend -PPI   Acute CVA with left hemiplegia, new stroke on R noted on CT head 6/15 Convulsions 6/15, no further activity noted, LTM EEG negative for seizure Carotid Stenosis  -appreciate Stroke Team  -continue Brillinta, ASA  -follow neuro exam  -minimize sedation to allow for clinical exam  -MRI/MRA brain as above  -continue statin  Chronic HFpEF -follow strict I/O's  -hold lasix 6/19 and reassess 6/20    DM2 Glucose range 115-121  -SSI, moderate scale  -CBG Q4   Vomiting  Dysphagia  KUB unremarkable 6/18  -continue TF advancement  -follow for further emesis   Goals of care 6/17 Update: Multiple daughters were updated at bedside.  A daughter from [place] arrived and had questions which were answered.  In general the family seemed focused on his comfort and quality of life and were not interested in doing procedures that may cause increased pain or suffering with little change in his long term prognosis.  Best Practice (right click and "Reselect all SmartList Selections" daily)  Diet/type: tubefeeds DVT prophylaxis: prophylactic heparin  GI prophylaxis: PPI Lines: N/A Foley:  Yes, and it is still needed Code Status:  full code Last date of multidisciplinary goals of care discussion: ongoing, last discussion 6/17.  Will follow up on family arrival 6/18.   Critical care time: 28 minutes     Noe Gens, MSN, APRN, NP-C, AGACNP-BC [facility] 01/20/2022, 8:08 AM   Please see [email or web address] for pager details.   From 7A-7P if no response, please call [phone] After hours, please call ELink [phone]

## 2022-01-20 NOTE — Progress Notes (Signed)
PT Cancellation Note  Patient Details Name: Chad [name] MRN: [id number] DOB: [birth year]   Cancelled Treatment:    Reason Eval/Treat Not Completed: Medical issues which prohibited therapy.  Not following commands, on the vent.  Will check back 6/20 and see as able. 01/20/2022  Ginger Carne., PT Acute Rehabilitation Services [phone]  (pager) [phone]  (office)   Tessie Fass Jovoni Borkenhagen 01/20/2022, 1:21 PM

## 2022-01-20 NOTE — Progress Notes (Signed)
[facility]  Patient self extubated while weaning on pressure support Answering a few questions Breathing comfortably  Continue supportive care as ordered Family updated by nursing.  Roselie Awkward, MD [facility] Pager: [phone] Cell: [phone] After 7:00 pm call Elink  [phone]

## 2022-01-20 NOTE — Progress Notes (Signed)
STROKE TEAM PROGRESS NOTE   SUBJECTIVE (INTERVAL HISTORY) RN is at the bedside. Pt still intubated on vent.  He is drowsy but open eyes on voice,   following only occasional commands on the right.Marland Kitchen Spontaneous moving on the right but not on the left.  . Family is considering aggressive care vs. One-way extubation.  Family meeting scheduled with CCM team for this afternoon.  MRI scan of the brain shows acute infarcts involving right frontal operculum, middle frontal gyrus, left frontal lobe and right occipital lobe.  [name] angiogram of the brain shows residual high-grade stenosis of superior division of the right M2 but otherwise unremarkable.  Carotid ultrasound is limited due to inability to obtain images on the left side but shows no significant right ICA stenosis.  Hemoglobin is stable at 9.5. OBJECTIVE Temp:  [97.7 F (36.5 C)-98.4 F (36.9 C)] 98 F (36.7 C) (06/19 1156) Pulse Rate:  [52-84] 73 (06/19 1200) Cardiac Rhythm: Sinus bradycardia (06/19 0800) Resp:  [14-24] 18 (06/19 1200) BP: (95-166)/(45-85) 148/62 (06/19 1200) SpO2:  [98 %-100 %] 100 % (06/19 1200) FiO2 (%):  [30 %] 30 % (06/19 1059)  Recent Labs  Lab 01/19/22 1948 01/19/22 2322 01/20/22 0356 01/20/22 0820 01/20/22 1158  GLUCAP 102* 118* 115* 120* 111*   Recent Labs  Lab 01/16/22 1208 01/28/2022 0339 01/20/2022 1623 01/18/22 0429 01/18/22 1646 01/19/22 0558 01/19/22 1750 01/20/22 0154  NA 138 137  --  139  --  145  --  150*  K 5.6* 5.4*  --  3.7  --  3.6  --  4.0  CL 105 103  --  104  --  107  --  109  CO2 23 21*  --  21*  --  23  --  25  GLUCOSE 115* 121*  --  153*  --  98  --  121*  BUN 56* 57*  --  57*  --  56*  --  61*  CREATININE 3.05* 3.01*  --  3.08*  --  2.95*  --  3.06*  CALCIUM 8.2* 8.0*  --  7.7*  --  7.9*  --  7.7*  MG  --  1.7   < > 2.3 2.1 2.1 2.0 2.0  PHOS  --  5.3*   < > 5.0* 4.5 4.8* 4.4 4.3   < > = values in this interval not displayed.   Recent Labs  Lab 01/25/2022 0703 01/05/2022 1118  01/16/22 0300 01/23/2022 0339 01/18/22 0429  AST 27 25 22 27 27   ALT 15 14 13 15 16   ALKPHOS 58 53 53 66 86  BILITOT 0.6 0.6 0.5 0.7 0.6  PROT 6.7 5.9* 5.4* 5.7* 6.1*  ALBUMIN 2.4* 2.1* 1.8* 2.0* 2.0*   Recent Labs  Lab 01/14/2022 0703 01/10/2022 1118 01/16/2022 1202 01/29/2022 2335 01/16/22 0300 01/09/2022 0339 01/18/22 0429 01/19/22 0558  WBC 14.2* 14.3*  --   --  18.4* 13.5* 8.9 10.5  NEUTROABS 9.9*  --   --   --  11.1*  --   --   --   HGB 8.0* 5.6*   < > 9.9* 9.7* 9.5* 8.7* 9.5*  HCT 24.4* 17.2*   < > 27.2* 27.5* 28.6* 26.0* 28.5*  MCV 84.1 86.4  --   --  88.7 89.1 87.2 86.9  PLT 298 278  --   --  212 250 214 248   < > = values in this interval not displayed.   No results for input(s): "CKTOTAL", "CKMB", "CKMBINDEX", "  TROPONINI" in the last 168 hours. No results for input(s): "LABPROT", "INR" in the last 72 hours. No results for input(s): "COLORURINE", "LABSPEC", "PHURINE", "GLUCOSEU", "HGBUR", "BILIRUBINUR", "KETONESUR", "PROTEINUR", "UROBILINOGEN", "NITRITE", "LEUKOCYTESUR" in the last 72 hours.  Invalid input(s): "APPERANCEUR"      Component Value Date/Time   CHOL 149 12/30/2021 0412   TRIG 68 12/30/2021 0412   HDL 45 12/30/2021 0412   CHOLHDL 3.3 12/30/2021 0412   VLDL 14 12/30/2021 0412   LDLCALC 90 12/30/2021 0412   Lab Results  Component Value Date   HGBA1C 6.1 (H) 12/29/2021   No results found for: "LABOPIA", "COCAINSCRNUR", "LABBENZ", "AMPHETMU", "THCU", "LABBARB"  No results for input(s): "ETH" in the last 168 hours.  I have personally reviewed the radiological images below and agree with the radiology interpretations.  VAS US CAROTID  Result Date: 01/20/2022 Carotid Arterial Duplex Study Patient Name:  Chad [name]  Date of Exam:   01/19/2022 Medical Rec #: [id number]          Accession #:    [id number] Date of Birth: 02-Dec-1931           Patient Gender: M Patient Age:   88 years Exam Location:  [facility] Procedure:      VAS US CAROTID Referring  Phys: Cornelius Moras XU --------------------------------------------------------------------------------  Indications:       CVA. Risk Factors:      Hypertension, hyperlipidemia. Limitations        Today's exam was limited due to the patient's inability or                    unwillingness to cooperate, the patient's respiratory                    variation, patient on a ventilator and patient movement,                    patient positioning. Comparison Study:  No prior studies. Performing Technologist: Oliver Hum RVT  Examination Guidelines: A complete evaluation includes B-mode imaging, spectral Doppler, color Doppler, and power Doppler as needed of all accessible portions of each vessel. Bilateral testing is considered an integral part of a complete examination. Limited examinations for reoccurring indications may be performed as noted.  Right Carotid Findings: +----------+--------+--------+--------+--------------------------+--------+           PSV cm/sEDV cm/sStenosisPlaque Description        Comments +----------+--------+--------+--------+--------------------------+--------+ CCA Prox  53      9               irregular and heterogenous         +----------+--------+--------+--------+--------------------------+--------+ CCA Distal61      8               irregular and heterogenous         +----------+--------+--------+--------+--------------------------+--------+ ICA Prox  77      9               calcific and heterogenous          +----------+--------+--------+--------+--------------------------+--------+ ICA Distal57      12                                        tortuous +----------+--------+--------+--------+--------------------------+--------+ ECA       83      7                                                  +----------+--------+--------+--------+--------------------------+--------+ +----------+--------+-------+--------+-------------------+  PSV cm/sEDV  cmsDescribeArm Pressure (mmHG) +----------+--------+-------+--------+-------------------+ [id number]                                         +----------+--------+-------+--------+-------------------+ +---------+--------+--+--------+-+----------------------------+ VertebralPSV cm/s67EDV cm/s5Antegrade and High resistant +---------+--------+--+--------+-+----------------------------+  Left Carotid Findings: Unable to obtain diagnostic images of the left carotid system due to patient movement, and poor positioning.  Summary: Right Carotid: Velocities in the right ICA are consistent with a 1-39% stenosis. Vertebrals: Right vertebral artery demonstrates high resistant flow. *See table(s) above for measurements and observations.  Electronically signed by Jamelle Haring on 01/20/2022 at 11:43:21 AM.    Final    [name] ANGIO HEAD WO CONTRAST  Result Date: 01/19/2022 CLINICAL DATA:  Stroke, follow-up. Right MCA infarct. Additional punctate infarcts involving the right occipital lobe and left frontal lobe. EXAM: MRA HEAD WITHOUT CONTRAST TECHNIQUE: Angiographic images of the Circle of Willis were acquired using MRA technique without intravenous contrast. COMPARISON:  MRA head 12/30/2021.  CTA head 12/30/2021 FINDINGS: Anterior circulation: Focal signal loss is present at the site of the previous right superior division M2 occlusion. Flow signal is now present distal to this region. The inferior M2 segment is within normal limits. Left MCA bifurcations are normal. Mild irregularity is present the cavernous internal carotid artery without a significant ICA stenosis. A1 and M1 segments are normal. The MCA bifurcations are intact. ACA branch vessels are normal. Posterior circulation: Left vertebral artery is the dominant vessel. The right vertebral artery is hypoplastic common bifurcating at the PICA. The vertebrobasilar junction and basilar artery are normal. Both posterior cerebral arteries originate from  basilar tip. Left posterior communicating artery is patent. Anatomic variants: None Other: None. IMPRESSION: 1. Signal loss at the site of the previous right superior division M2 occlusion. Flow is present distal to signal loss. This likely represents a residual high-grade stenosis. 2. Otherwise normal MRA circle-of-Willis. Electronically Signed   By: San Morelle M.D.   On: 01/19/2022 18:55   [name] BRAIN WO CONTRAST  Result Date: 01/19/2022 CLINICAL DATA:  Mental status change.  Stroke, follow-up. EXAM: MRI HEAD WITHOUT CONTRAST TECHNIQUE: Multiplanar, multiecho pulse sequences of the brain and surrounding structures were obtained without intravenous contrast. COMPARISON:  CT head without contrast 01/16/2022. [name] head without contrast 12/30/2021. FINDINGS: Brain: Restricted diffusion is present in the right frontal operculum consistent with acute/subacute infarct. This extends superiorly the perirolandic cortex on the right. Involvement of the right middle frontal gyrus also noted. A punctate nonhemorrhagic infarct is present in the left frontal lobe punctate cortical infarct is present in the right occipital lobe. The infarct involves the superior aspect of the right insula. T2 and FLAIR hyperintensities are associated with the areas of acute infarction. Confluent periventricular and subcortical T2 hyperintensities are present otherwise. Remote lacunar infarcts are present the basal ganglia bilaterally. Remote lacunar infarcts are present in the thalami. White matter changes extend into the brainstem. Remote lacunar infarcts of the cerebellum are stable. The ventricles are proportionate to the degree of atrophy. No significant extraaxial fluid collection is present. The internal auditory canals are within normal limits. Vascular: Flow is present in the major intracranial arteries. Skull and upper cervical spine: Degenerative changes are present in the upper cervical spine with narrowing at C3-4. The  craniocervical junction is normal. Marrow signal is normal. Sinuses/Orbits: Bilateral mastoid effusions are present. Fluid is present the nasopharynx. This is sec scratched at this is  likely secondary to intubation. The paranasal sinuses and mastoid air cells are otherwise clear. Bilateral lens replacements are noted. Globes and orbits are otherwise unremarkable. IMPRESSION: 1. Acute/subacute nonhemorrhagic infarct involving the right frontal operculum, right middle frontal gyrus, left frontal lobe and right occipital lobe. 2. Remote lacunar infarcts of the basal ganglia and thalami bilaterally. 3. Remote lacunar infarcts in the cerebellum. 4. Atrophy and white matter disease is moderately advanced for age. This likely reflects the sequela of chronic microvascular ischemia. Electronically Signed   By: San Morelle M.D.   On: 01/19/2022 18:49   DG Abd Portable 1V  Result Date: 01/19/2022 CLINICAL DATA:  Nausea and vomiting. EXAM: PORTABLE ABDOMEN - 1 VIEW COMPARISON:  None Available. FINDINGS: Small bore feeding tube terminates in the distal stomach. Bowel gas pattern is unremarkable. IMPRESSION: Small bore feeding tube terminates in the distal stomach. Electronically Signed   By: San Morelle M.D.   On: 01/19/2022 14:53   DG Abd Portable 1V  Result Date: 01/29/2022 CLINICAL DATA:  Feeding tube EXAM: PORTABLE ABDOMEN - 1 VIEW COMPARISON:  None Available. FINDINGS: Enteric tube is present with tip overlying the distal stomach in the pyloric region. There is some contrast within the fundus of the stomach and colon. IMPRESSION: Enteric tube tip overlies the distal stomach. Electronically Signed   By: Macy Mis M.D.   On: 01/14/2022 10:51   Overnight EEG with video  Result Date: 01/06/2022 Lora Havens, MD     01/18/2022  1:32 PM Patient Name: Chad THOR MRN: [id number] Epilepsy Attending: Lora Havens Referring Physician/Provider: Gwinda Maine, MD Duration: 01/16/2022  1240 to 01/18/2022  1240  Patient history: 88 year old male with history of stroke had an episode of posturing right arm and left gaze.  EEG to evaluate for seizure.  Level of alertness:  lethargic  AEDs during EEG study: None  Technical aspects: This EEG study was done with scalp electrodes positioned according to the 10-20 International system of electrode placement. Electrical activity was acquired at a sampling rate of 500Hz  and reviewed with a high frequency filter of 70Hz  and a low frequency filter of 1Hz . EEG data were recorded continuously and digitally stored.  Description: EEG showed continuous generalized and and lateralized right hemisphere 3- 6 Hz theta-delta slowing. Hyperventilation and photic stimulation were not performed.    ABNORMALITY - Continuous slow, generalized and lateralized right hemisphere  IMPRESSION: This study is suggestive of cortical dysfunction arising from right hemisphere which is likely secondary to underlying strokes. Additionally there is moderate to severe diffuse encephalopathy, nonspecific etiology. No seizures or epileptiform discharges were seen throughout the recording.  Lora Havens    EEG adult  Result Date: 01/16/2022 Lora Havens, MD     01/16/2022 12:45 PM Patient Name: Chad [name] MRN: [id number] Epilepsy Attending: Lora Havens Referring Physician/Provider: Corey Harold, NP Date: 01/16/2022 Duration: 21.43 mins Patient history: 88 year old male with history of stroke had an episode of posturing right arm and left gaze.  EEG to evaluate for seizure. Level of alertness:  lethargic AEDs during EEG study: Ativan Technical aspects: This EEG study was done with scalp electrodes positioned according to the 10-20 International system of electrode placement. Electrical activity was acquired at a sampling rate of 500Hz  and reviewed with a high frequency filter of 70Hz  and a low frequency filter of 1Hz . EEG data were recorded continuously and digitally  stored. Description: EEG showed continuous generalized and maximal left frontal 3- 6 Hz  theta-delta slowing which at times appears sharply contoured. Hyperventilation and photic stimulation were not performed.   ABNORMALITY - Continuous slow, generalized IMPRESSION: This study is suggestive of moderate to severe diffuse encephalopathy, nonspecific etiology. No seizures or epileptiform discharges were seen throughout the recording. Priyanka Barbra Sarks   CT HEAD WO CONTRAST (5MM)  Result Date: 01/16/2022 CLINICAL DATA:  Mental status change, unknown cause. EXAM: CT HEAD WITHOUT CONTRAST TECHNIQUE: Contiguous axial images were obtained from the base of the skull through the vertex without intravenous contrast. RADIATION DOSE REDUCTION: This exam was performed according to the departmental dose-optimization program which includes automated exposure control, adjustment of the mA and/or kV according to patient size and/or use of iterative reconstruction technique. COMPARISON:  CT angiogram head/neck 12/30/2021. Brain MRI 12/30/2021. FINDINGS: Brain: Moderate cerebral atrophy. New from the prior head CT of 12/30/2021, there is a small to moderate-sized acute/subacute cortical and subcortical infarct within the right frontoparietal lobes and right insula (right MCA vascular territory). Additional known small subacute infarcts within the right MCA vascular territory were better appreciated on the prior brain MRI of 12/30/2021 (acute at that time). Background advanced patchy and ill-defined hypoattenuation within the cerebral white matter, nonspecific but compatible chronic small vessel ischemic disease. Redemonstrated chronic lacunar infarcts within the left basal ganglia and thalamus. There is no acute intracranial hemorrhage. No extra-axial fluid collection. No evidence of an intracranial mass. No midline shift. Vascular: Atherosclerotic calcifications. Redemonstrated focus of calcification along an M2 right middle  cerebral artery, which may reflect a calcified embolus. Skull: No fracture or aggressive osseous lesion. Sinuses/Orbits: No mass or acute finding within the imaged orbits. Trace mucosal thickening within the bilateral ethmoid sinuses. IMPRESSION: New from the prior head CT of 12/30/2021, there is a small to moderate-sized acute/subacute cortical and subcortical infarct within the right frontoparietal lobes and right insula (right MCA vascular territory). Additional known small subacute infarcts within the right MCA vascular territory were better appreciated on the prior brain MRI of 12/30/2021 (acute at that time). Background advanced chronic small vessel ischemic changes within the cerebral white matter. Redemonstrated chronic lacunar infarcts within the left basal ganglia and left thalamus. Moderate generalized cerebral atrophy. Electronically Signed   By: Kellie Simmering D.O.   On: 01/16/2022 11:28   US RENAL  Result Date: 01/23/2022 CLINICAL DATA:  Renal dysfunction EXAM: RENAL / URINARY TRACT ULTRASOUND COMPLETE COMPARISON:  None Available. FINDINGS: Right Kidney: Renal measurements: 8.9 x 5 x 5 cm = volume: 115.2 mL. There is no hydronephrosis. There is increased cortical echogenicity. Left Kidney: Renal measurements: 10.3 x 6.2 x 5 cm = volume: 165.9 mL. There is no hydronephrosis. There is increased cortical echogenicity. Bladder: Foley catheter is seen in the bladder. Bladder is not adequately distended for evaluation. Other: 1.1 cm splenule is noted adjacent to the inferior margin of spleen. Left pleural effusion is seen. IMPRESSION: There is no hydronephrosis. Increased cortical echogenicity suggests medical renal disease. Left pleural effusion. Electronically Signed   By: Elmer Picker M.D.   On: 01/07/2022 12:21   Portable Chest x-ray  Result Date: 01/24/2022 CLINICAL DATA:  Intubation EXAM: PORTABLE CHEST 1 VIEW COMPARISON:  01/14/2022, 3:13 a.m. FINDINGS: Interval endotracheal intubation,  tip approximately 2 cm above the carina. Enteric feeding tube remains in position, partially imaged. Cardiomegaly. Diffuse bilateral heterogeneous and interstitial airspace opacity, unchanged. IMPRESSION: 1. Interval endotracheal intubation, tip approximately 2 cm above the carina. Enteric feeding tube remains in position, partially imaged. 2. Cardiomegaly. Diffuse bilateral heterogeneous and interstitial  airspace opacity, unchanged. Electronically Signed   By: Delanna Ahmadi M.D.   On: 01/14/2022 10:39   DG Abd 1 View  Result Date: 01/27/2022 CLINICAL DATA:  88 year old male with abdominal pain and vomiting. EXAM: ABDOMEN - 1 VIEW COMPARISON:  01/10/2022. FINDINGS: Portable AP supine views at 0704 hours. Patient is mildly rotated to the left but the enteric tube tip is now in the gastric body. Extensive Calcified aortic atherosclerosis. Pelvic vascular calcifications. Progressive opacification of the visible left lung base, left hemidiaphragm now largely obscured. Decreased large bowel gas and borderline to mildly dilated gas-filled mid abdominal small bowel loops now. Spinal scoliosis and degeneration. No acute osseous abnormality identified. IMPRESSION: 1. Enteric tube tip now in the gastric body. 2. Decreased large bowel gas and borderline to mildly dilated mid abdominal small bowel loops. This could reflect ileus or developing small bowel obstruction. Follow-up abdominal radiographs may be valuable. 3. Progressive opacification at the left lung base since 01/10/2022, see portable chest x-ray 0313 hours today. Electronically Signed   By: Genevie Ann M.D.   On: 01/21/2022 07:37   DG Chest Port 1 View  Result Date: 01/14/2022 CLINICAL DATA:  Acute respiratory distress EXAM: PORTABLE CHEST 1 VIEW COMPARISON:  03/23/2022 FINDINGS: Cardiomegaly. Vascular congestion. Aortic atherosclerosis. Perihilar airspace opacities bilaterally. No effusions. No acute bony abnormality. IMPRESSION: Perihilar airspace opacities,  likely edema although pneumonia not excluded. Electronically Signed   By: Rolm Baptise M.D.   On: 01/26/2022 03:29   DG Swallowing Func-Speech Pathology  Result Date: 01/10/2022 Table formatting from the original result was not included. Objective Swallowing Evaluation: Type of Study: MBS-Modified Barium Swallow Study  Patient Details Name: Chad [name] MRN: [id number] Date of Birth: Jan 27, 1932 Today's Date: 01/10/2022 Time: SLP Start Time (ACUTE ONLY): 1100 -SLP Stop Time (ACUTE ONLY): 1123 SLP Time Calculation (min) (ACUTE ONLY): 23 min Past Medical History: Past Medical History: Diagnosis Date  Arthritis   Heart murmur   asa child   Hypertension   Stroke [facility])  Past Surgical History: Past Surgical History: Procedure Laterality Date  EYE SURGERY Bilateral 2022  IR ANGIO INTRA EXTRACRAN SEL COM CAROTID INNOMINATE UNI R MOD SED  01/01/2022  IR CT HEAD LTD  01/01/2022  IR PTA INTRACRANIAL  01/01/2022  JOINT REPLACEMENT    RADIOLOGY WITH ANESTHESIA N/A 01/01/2022  Procedure: Angiogram;  Surgeon: Luanne Bras, MD;  Location: [place];  Service: Radiology;  Laterality: N/A;  TONSILLECTOMY    TOTAL KNEE ARTHROPLASTY Right 03/30/2013  Procedure: RIGHT TOTAL KNEE ARTHROPLASTY;  Surgeon: Tobi Bastos, MD;  Location: [facility];  Service: Orthopedics;  Laterality: Right;  TOTAL KNEE ARTHROPLASTY Left 10/09/2020  Procedure: TOTAL KNEE ARTHROPLASTY;  Surgeon: Paralee Cancel, MD;  Location: [facility];  Service: Orthopedics;  Laterality: Left;  70 mins HPI: 88 yo male presenting to the ED on 5/28 with L sided weakness, facial droop, slurred speech, and fall. 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 PMH including hypothyroidism chronic pain syndromes, CKD, HTN, arthritis, bil TKA.  Subjective: alert, friendly  Recommendations for follow up therapy are one component of a multi-disciplinary discharge planning process, led by the attending physician.  Recommendations may be  updated based on patient status, additional functional criteria and insurance authorization. Assessment / Plan / Recommendation   01/10/2022   8:50 PM Clinical Impressions Clinical Impressions SLP Visit Diagnosis MBSS completed secondary to clinical concerns for aspiration of current diet textures (puree and nectar-thick liquids). At this  time, pt presents with severe oropharyngeal dysphagia, with significant UES dysfunction, which resulted in significant posterior tracheal sensed aspiration (PAS 7) of pureed texture via interarytenoid space due to overflow of pyriform sinus residuals into laryngeal vestibule. Unfortunately, pharyngeal stasis and pyriform sinus residuals could not clear due to poor UES relaxation with mild esophageal backflow to pyriform sinuses. Additionally, delayed sensation of gross aspiration of nectar-thick liquid via tsp was appreciated during the swallow. [name] cough response was not effective in pulmonary clearance. Pt required tactile, hyoglossal assistance and verbal cues, from SLP, to initiate swallow response during puree trials. It should be noted that this is a significant change from pt's MBSS results documented on 01/02/2022.   Given pt's risk severity for development of aspiration PNA (Total A for oral care, decreased ambulation, deconditioning) and change in swallow function, recommend initiation of NPO with temporary alternate means of nutrition, hydration, and medications at this time. May consider GI referral to assess UES function if improvement is not noted with therapy alone. [name] daughter present and results + recommendations were reviewed with pt an daughter who verbalized agreement with recommendations following education. MD, LPN, and PA notified of recommendations. Please see imaging report for full details. Dysphagia, oropharyngeal phase (R13.12);Cognitive communication deficit (R41.841) Impact on safety and function Severe aspiration risk;Risk for inadequate  nutrition/hydration     01/02/2022  11:00 AM Treatment Recommendations Treatment Recommendations Therapy as outlined in treatment plan below     01/10/2022   1:59 PM Prognosis Prognosis for Safe Diet Advancement Good Barriers to Reach Goals Cognitive deficits;Severity of deficits   01/10/2022   8:50 PM Diet Recommendations SLP Diet Recommendations NPO;Alternative means - temporary Medication Administration Via alternative means Postural Changes Other (Comment)     01/10/2022   8:50 PM Other Recommendations Recommended Consults Consider GI evaluation;Consider esophageal assessment Oral Care Recommendations Oral care QID Other Recommendations Have oral suction available   01/10/2022   8:50 PM Frequency and Duration  Treatment Duration 2 weeks     01/10/2022   1:53 PM Oral Phase Oral Phase Impaired Oral - Nectar Teaspoon Weak lingual manipulation;Lingual pumping;Delayed oral transit;Decreased bolus cohesion;Premature spillage;Lingual/palatal residue;Piecemeal swallowing;Reduced posterior propulsion Oral - Nectar Cup NT Oral - Nectar Straw NT Oral - Thin Cup NT Oral - Thin Straw NT Oral - Puree Weak lingual manipulation;Lingual pumping;Incomplete tongue to palate contact;Lingual/palatal residue;Delayed oral transit;Decreased bolus cohesion;Premature spillage;Holding of bolus;Piecemeal swallowing Oral - Mech Soft NT Oral - Regular NT Oral - Multi-Consistency NT Oral - Pill NT    01/10/2022   1:54 PM Pharyngeal Phase Pharyngeal Phase Impaired Pharyngeal- Nectar Teaspoon Delayed swallow initiation-pyriform sinuses;Reduced pharyngeal peristalsis;Reduced airway/laryngeal closure;Moderate aspiration;Inter-arytenoid space residue;Pharyngeal residue - cp segment;Pharyngeal residue - posterior pharnyx;Pharyngeal residue - pyriform;Pharyngeal residue - valleculae;Lateral channel residue;Penetration/Aspiration during swallow;Reduced laryngeal elevation Pharyngeal Material enters airway, passes BELOW cords and not ejected out despite cough  attempt by patient Pharyngeal- Nectar Cup NT Pharyngeal- Nectar Straw NT Pharyngeal- Thin Cup NT Pharyngeal- Thin Straw NT Pharyngeal- Puree Delayed swallow initiation-pyriform sinuses;Reduced pharyngeal peristalsis;Reduced anterior laryngeal mobility;Reduced tongue base retraction;Penetration/Apiration after swallow;Moderate aspiration;Pharyngeal residue - pyriform;Pharyngeal residue - posterior pharnyx;Pharyngeal residue - cp segment;Inter-arytenoid space residue;Lateral channel residue;Compensatory strategies attempted (with notebox) Pharyngeal Material enters airway, passes BELOW cords and not ejected out despite cough attempt by patient Pharyngeal- Mechanical Soft NT Pharyngeal- Regular NT Pharyngeal- Multi-consistency NT Pharyngeal- Pill NT    01/10/2022   1:58 PM Cervical Esophageal Phase  Cervical Esophageal Phase Impaired Nectar Teaspoon Reduced cricopharyngeal relaxation Nectar Cup NT Nectar Straw NT  Puree Esophageal backflow into the pharynx;Reduced cricopharyngeal relaxation Mechanical Soft NT Regular NT Multi-consistency NT Pill NT Cervical Esophageal Comment Decreased relaxation of the UES observed with significant pyriform sinus residuals Bethany A Lutes 01/10/2022, 9:06 PM                     DG Abd Portable 1V  Result Date: 01/10/2022 CLINICAL DATA:  Feeding tube placement. EXAM: PORTABLE ABDOMEN - 1 VIEW COMPARISON:  None Available. FINDINGS: Feeding tube tip is at the level of the gastric antrum. Small rounded densities are seen in the region of the colon likely related to diverticula. There is a heterogeneous rounded density projecting over the right upper quadrant measuring 3.0 x 3.0 cm. This is indeterminate. Lung bases are clear. There is mild curvature of the lumbar spine with degenerative change. IMPRESSION: 1. Feeding tube tip is at the level of the gastric antrum. 2. Scattered colonic diverticula. 3. 3 cm heterogeneous density in the right upper quadrant, indeterminate. Findings may  represent a large gallstone, renal calcification or other calcified mass. Please correlate clinically. This can be further evaluated with CT. Electronically Signed   By: Ronney Asters M.D.   On: 01/10/2022 15:07   IR ANGIO INTRA EXTRACRAN SEL COM CAROTID INNOMINATE UNI R MOD SED  Result Date: 01/03/2022 CLINICAL DATA:  Left-sided weakness with dysarthria. Patient with calcified embolus in the inferior division right MCA. Discovered to have high-grade stenosis of the extracranial right ICA proximally EXAM: IR ANGIO INTRA EXTRACRAN SEL COM CAROTID INNOMINATE UNI RIGHT MOD SED COMPARISON:  CT angiogram of the head and neck of Dec 30, 2021. MEDICATIONS: Heparin 3,000 units IV. Ancef 2 g IV antibiotic was administered within 1 hour of the procedure. ANESTHESIA/SEDATION: General anesthesia. CONTRAST:  Omnipaque 300 approximately 120 cc. FLUOROSCOPY TIME:  Fluoroscopy Time: 76 minutes 36) seconds (1257 mGy). COMPLICATIONS: None immediate. TECHNIQUE: Informed written consent was obtained from the patient after a thorough discussion of the procedural risks, benefits and alternatives. All questions were addressed. Maximal Sterile Barrier Technique was utilized including caps, mask, sterile gowns, sterile gloves, sterile drape, hand hygiene and skin antiseptic. A timeout was performed prior to the initiation of the procedure. The left groin was prepped and draped in the usual sterile fashion. Thereafter using modified Seldinger technique, transfemoral access into the left common femoral artery was obtained without difficulty. Over a 0.035 inch guidewire, an 8 French 25 cm Pinnacle sheath was inserted. Through this, and also over 0.035 inch guidewire, a 120 cm 6 French Simmons 2 catheter inside of an 087 95 cm balloon guide catheter combination was advanced to the aortic arch region and selectively positioned in the proximal right common carotid artery and the innominate artery. An arteriogram was then performed centered  extra cranially and intracranially. FINDINGS: The innominate arteriogram demonstrates significant tortuosity of the innominate artery in its entirety extending into the right common carotid artery with a 180 degree loop in the proximal right common carotid artery. Subclavian artery demonstrates patency with opacification of a hypoplastic right vertebral artery extra cranially. The right common carotid arteriogram demonstrates complex atherosclerotic calcified plaque at the right common carotid bifurcation extending into the bulb region of the right internal carotid artery resulting in a high-grade approximately 80-90% stenosis. More distally, the right internal carotid artery opacifies to the cranial skull base. Patency is seen of the petrous, cavernous and supraclinoid right ICA with a prominent right posterior communicating artery. Proximal portions of the right middle cerebral artery and the  right anterior cerebral artery demonstrate patency. ENDOVASCULAR REVASCULARIZATION OF HIGH-GRADE STENOSIS OF THE RIGHT INTERNAL CAROTID ARTERY PROXIMALLY WITH BALLOON ANGIOPLASTY The balloon guide catheter was advanced into the proximal right common carotid artery. Through this, and also over a 0.014 inch Transend EX soft tip micro guidewire, an 021 150 cm microcatheter was then advanced with biplane DSA roadmap to the proximal cavernous right ICA followed by the microcatheter. The micro guidewire was removed. Good aspiration obtained from the hub of the microcatheter. A gentle control arteriogram performed through the microcatheter demonstrated safe positioning of the tip of the microcatheter. This in turn was then exchanged out for a 300 cm 014 inch BMW exchange wire with a moderate J configuration. The tip of the micro guidewire was positioned at the petrous cavernous junction. Measurements were then performed of the right internal carotid artery distal to the stenosis, and just proximal to the stenosis of the distal  common carotid artery. A 5 mm x 20 mm 014 inch Viatrac balloon guide catheter was then prepped and purged retrogradely with heparinized saline infusion, and retrogradely with 50% contrast and 50% heparinized saline infusion. Using the rapid exchange technique, the balloon catheter was then advanced without difficulty and positioned with the distal and proximal markers adequate distant from the site of the severe stenosis in the proximal right internal carotid artery. Slow control inflation was then performed using micro inflation syringe device via micro tubing. Inflations were performed slowly to 9 atmospheres where it was maintained for approximately 60 seconds. Thereafter, the balloon was deflated and retrieved and removed. A control arteriogram performed through the balloon guide catheter in the proximal right common carotid artery demonstrated significantly improved caliber and flow through the angioplastied segment. More distally, free flow was noted into the distal right ICA intracranially. Over the exchange BMW micro guidewire, a 8 mm x 26 mm Wallstent stent delivery system which had been prepped with heparinized saline infusion was then advanced again using the rapid exchange technique to the proximal right common carotid artery. Resistance was encountered to further advancement of the stent delivery apparatus. At this time the balloon guide catheter was advanced to the distal right common carotid artery. Further attempts were made to advance the stent delivery apparatus into the right internal carotid proximally. Advancement could only manage just distal to the angioplastied segment with no significant coverage of the angioplastied segment. Further attempts at advancing were met with resistance secondary to a step-off related to the calcified plaque. After multiple attempts it was decided to stop the procedure. Final control arteriogram performed through the balloon guide catheter in the right common  carotid artery continued to demonstrate excellent flow through the right external carotid artery with a mild-to-moderate stenosis at its origin. The right internal carotid artery proximally demonstrated approximately 60% patency which was maintained on subsequent 20 minute arteriogram. Centered intracranially demonstrated brisk flow into the right anterior cerebral artery and the right middle cerebral distribution. The previously occluded anterior branch of the inferior division proximally due to a calcified plaque remained stable. The delayed arterial phase demonstrated retrograde opacification of the posterior 2/3 of the cortical branches in the sylvian triangle from the branches of the inferior division and the superior division, and also from the pericallosal branches. The balloon guide catheter was removed. The 8 French Pinnacle sheath in the left common femoral artery was removed with manual compression held for approximately 25 minutes with a quick clot to achieved hemostasis at this site. Distal pulses remained palpable in the  dorsalis pedis arteries bilaterally unchanged. An immediate CT of the brain demonstrated no evidence of hemorrhagic complications. The patient was then gradually extubated without difficulty. Patient was able to maintain his airway, and oxygen concentrations. He was able to move his right side spontaneously and to command. The patient was able to follow simple commands appropriately. No significant motor movement was evident in the left upper extremity, with only a flicker in the left foot toes. Patient was then transferred to the PACU and then subsequently the neuro ICU for post revascularization care. Later in the afternoon, the patient appeared more awake, alert and appropriately responsive. Had mild intermittent right gaze deviation but was able to track his eyes to the left past the midline. Otherwise, neurologically he remained stable unchanged. 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 PLAN: Follow-up in the clinic 4 to 6 weeks post discharge. Electronically Signed   By: Luanne Bras M.D.   On: 01/03/2022 08:12   IR CT Head Ltd  Result Date: 01/03/2022 CLINICAL DATA:  Left-sided weakness with dysarthria. Patient with calcified embolus in the inferior division right MCA. Discovered to have high-grade stenosis of the extracranial right ICA proximally EXAM: IR ANGIO INTRA EXTRACRAN SEL COM CAROTID INNOMINATE UNI RIGHT MOD SED COMPARISON:  CT angiogram of the head and neck of Dec 30, 2021. MEDICATIONS: Heparin 3,000 units IV. Ancef 2 g IV antibiotic was administered within 1 hour of the procedure. ANESTHESIA/SEDATION: General anesthesia. CONTRAST:  Omnipaque 300 approximately 120 cc. FLUOROSCOPY TIME:  Fluoroscopy Time: 76 minutes 36) seconds (1257 mGy). COMPLICATIONS: None immediate. TECHNIQUE: Informed written consent was obtained from the patient after a thorough discussion of the procedural risks, benefits and alternatives. All questions were addressed. Maximal Sterile Barrier Technique was utilized including caps, mask, sterile gowns, sterile gloves, sterile drape, hand hygiene and skin antiseptic. A timeout was performed prior to the initiation of the procedure. The left groin was prepped and draped in the usual sterile fashion. Thereafter using modified Seldinger technique, transfemoral access into the left common femoral artery was obtained without difficulty. Over a 0.035 inch guidewire, an 8 French 25 cm Pinnacle sheath was inserted. Through this, and also over 0.035 inch guidewire, a 120 cm 6 French Simmons 2 catheter inside of an 087 95 cm balloon guide catheter combination was advanced to the aortic arch region and selectively positioned in the proximal right common carotid artery and the  innominate artery. An arteriogram was then performed centered extra cranially and intracranially. FINDINGS: The innominate arteriogram demonstrates significant tortuosity of the innominate artery in its entirety extending into the right common carotid artery with a 180 degree loop in the proximal right common carotid artery. Subclavian artery demonstrates patency with opacification of a hypoplastic right vertebral artery extra cranially. The right common carotid arteriogram demonstrates complex atherosclerotic calcified plaque at the right common carotid bifurcation extending into the bulb region of the right internal carotid artery resulting in a high-grade approximately 80-90% stenosis. More distally, the right internal carotid artery opacifies to the cranial skull base. Patency is seen of the petrous, cavernous and supraclinoid right ICA with a prominent right posterior communicating artery. Proximal portions of the right middle cerebral artery and the right anterior cerebral artery demonstrate patency. ENDOVASCULAR REVASCULARIZATION OF HIGH-GRADE STENOSIS OF THE RIGHT INTERNAL CAROTID ARTERY PROXIMALLY WITH BALLOON ANGIOPLASTY The balloon guide catheter was advanced into the  proximal right common carotid artery. Through this, and also over a 0.014 inch Transend EX soft tip micro guidewire, an 021 150 cm microcatheter was then advanced with biplane DSA roadmap to the proximal cavernous right ICA followed by the microcatheter. The micro guidewire was removed. Good aspiration obtained from the hub of the microcatheter. A gentle control arteriogram performed through the microcatheter demonstrated safe positioning of the tip of the microcatheter. This in turn was then exchanged out for a 300 cm 014 inch BMW exchange wire with a moderate J configuration. The tip of the micro guidewire was positioned at the petrous cavernous junction. Measurements were then performed of the right internal carotid artery distal to the  stenosis, and just proximal to the stenosis of the distal common carotid artery. A 5 mm x 20 mm 014 inch Viatrac balloon guide catheter was then prepped and purged retrogradely with heparinized saline infusion, and retrogradely with 50% contrast and 50% heparinized saline infusion. Using the rapid exchange technique, the balloon catheter was then advanced without difficulty and positioned with the distal and proximal markers adequate distant from the site of the severe stenosis in the proximal right internal carotid artery. Slow control inflation was then performed using micro inflation syringe device via micro tubing. Inflations were performed slowly to 9 atmospheres where it was maintained for approximately 60 seconds. Thereafter, the balloon was deflated and retrieved and removed. A control arteriogram performed through the balloon guide catheter in the proximal right common carotid artery demonstrated significantly improved caliber and flow through the angioplastied segment. More distally, free flow was noted into the distal right ICA intracranially. Over the exchange BMW micro guidewire, a 8 mm x 26 mm Wallstent stent delivery system which had been prepped with heparinized saline infusion was then advanced again using the rapid exchange technique to the proximal right common carotid artery. Resistance was encountered to further advancement of the stent delivery apparatus. At this time the balloon guide catheter was advanced to the distal right common carotid artery. Further attempts were made to advance the stent delivery apparatus into the right internal carotid proximally. Advancement could only manage just distal to the angioplastied segment with no significant coverage of the angioplastied segment. Further attempts at advancing were met with resistance secondary to a step-off related to the calcified plaque. After multiple attempts it was decided to stop the procedure. Final control arteriogram performed  through the balloon guide catheter in the right common carotid artery continued to demonstrate excellent flow through the right external carotid artery with a mild-to-moderate stenosis at its origin. The right internal carotid artery proximally demonstrated approximately 60% patency which was maintained on subsequent 20 minute arteriogram. Centered intracranially demonstrated brisk flow into the right anterior cerebral artery and the right middle cerebral distribution. The previously occluded anterior branch of the inferior division proximally due to a calcified plaque remained stable. The delayed arterial phase demonstrated retrograde opacification of the posterior 2/3 of the cortical branches in the sylvian triangle from the branches of the inferior division and the superior division, and also from the pericallosal branches. The balloon guide catheter was removed. The 8 French Pinnacle sheath in the left common femoral artery was removed with manual compression held for approximately 25 minutes with a quick clot to achieved hemostasis at this site. Distal pulses remained palpable in the dorsalis pedis arteries bilaterally unchanged. An immediate CT of the brain demonstrated no evidence of hemorrhagic complications. The patient was then gradually extubated without difficulty. Patient was able to  maintain his airway, and oxygen concentrations. He was able to move his right side spontaneously and to command. The patient was able to follow simple commands appropriately. No significant motor movement was evident in the left upper extremity, with only a flicker in the left foot toes. Patient was then transferred to the PACU and then subsequently the neuro ICU for post revascularization care. Later in the afternoon, the patient appeared more awake, alert and appropriately responsive. Had mild intermittent right gaze deviation but was able to track his eyes to the left past the midline. Otherwise, neurologically he  remained stable unchanged. 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 PLAN: Follow-up in the clinic 4 to 6 weeks post discharge. Electronically Signed   By: Luanne Bras M.D.   On: 01/03/2022 08:12   IR PTA Intracranial  Result Date: 01/03/2022 CLINICAL DATA:  Left-sided weakness with dysarthria. Patient with calcified embolus in the inferior division right MCA. Discovered to have high-grade stenosis of the extracranial right ICA proximally EXAM: IR ANGIO INTRA EXTRACRAN SEL COM CAROTID INNOMINATE UNI RIGHT MOD SED COMPARISON:  CT angiogram of the head and neck of Dec 30, 2021. MEDICATIONS: Heparin 3,000 units IV. Ancef 2 g IV antibiotic was administered within 1 hour of the procedure. ANESTHESIA/SEDATION: General anesthesia. CONTRAST:  Omnipaque 300 approximately 120 cc. FLUOROSCOPY TIME:  Fluoroscopy Time: 76 minutes 36) seconds (1257 mGy). COMPLICATIONS: None immediate. TECHNIQUE: Informed written consent was obtained from the patient after a thorough discussion of the procedural risks, benefits and alternatives. All questions were addressed. Maximal Sterile Barrier Technique was utilized including caps, mask, sterile gowns, sterile gloves, sterile drape, hand hygiene and skin antiseptic. A timeout was performed prior to the initiation of the procedure. The left groin was prepped and draped in the usual sterile fashion. Thereafter using modified Seldinger technique, transfemoral access into the left common femoral artery was obtained without difficulty. Over a 0.035 inch guidewire, an 8 French 25 cm Pinnacle sheath was inserted. Through this, and also over 0.035 inch guidewire, a 120 cm 6 French Simmons 2 catheter inside of an 087 95 cm balloon guide catheter combination was advanced to the aortic arch region and selectively positioned  in the proximal right common carotid artery and the innominate artery. An arteriogram was then performed centered extra cranially and intracranially. FINDINGS: The innominate arteriogram demonstrates significant tortuosity of the innominate artery in its entirety extending into the right common carotid artery with a 180 degree loop in the proximal right common carotid artery. Subclavian artery demonstrates patency with opacification of a hypoplastic right vertebral artery extra cranially. The right common carotid arteriogram demonstrates complex atherosclerotic calcified plaque at the right common carotid bifurcation extending into the bulb region of the right internal carotid artery resulting in a high-grade approximately 80-90% stenosis. More distally, the right internal carotid artery opacifies to the cranial skull base. Patency is seen of the petrous, cavernous and supraclinoid right ICA with a prominent right posterior communicating artery. Proximal portions of the right middle cerebral artery and the right anterior cerebral artery demonstrate patency. ENDOVASCULAR REVASCULARIZATION OF HIGH-GRADE STENOSIS OF THE RIGHT INTERNAL CAROTID ARTERY PROXIMALLY WITH BALLOON ANGIOPLASTY The balloon guide catheter was advanced into the proximal right common carotid artery. Through this, and also over a 0.014 inch Transend EX soft tip micro guidewire, an 021 150 cm microcatheter was then advanced with biplane DSA  roadmap to the proximal cavernous right ICA followed by the microcatheter. The micro guidewire was removed. Good aspiration obtained from the hub of the microcatheter. A gentle control arteriogram performed through the microcatheter demonstrated safe positioning of the tip of the microcatheter. This in turn was then exchanged out for a 300 cm 014 inch BMW exchange wire with a moderate J configuration. The tip of the micro guidewire was positioned at the petrous cavernous junction. Measurements were then performed  of the right internal carotid artery distal to the stenosis, and just proximal to the stenosis of the distal common carotid artery. A 5 mm x 20 mm 014 inch Viatrac balloon guide catheter was then prepped and purged retrogradely with heparinized saline infusion, and retrogradely with 50% contrast and 50% heparinized saline infusion. Using the rapid exchange technique, the balloon catheter was then advanced without difficulty and positioned with the distal and proximal markers adequate distant from the site of the severe stenosis in the proximal right internal carotid artery. Slow control inflation was then performed using micro inflation syringe device via micro tubing. Inflations were performed slowly to 9 atmospheres where it was maintained for approximately 60 seconds. Thereafter, the balloon was deflated and retrieved and removed. A control arteriogram performed through the balloon guide catheter in the proximal right common carotid artery demonstrated significantly improved caliber and flow through the angioplastied segment. More distally, free flow was noted into the distal right ICA intracranially. Over the exchange BMW micro guidewire, a 8 mm x 26 mm Wallstent stent delivery system which had been prepped with heparinized saline infusion was then advanced again using the rapid exchange technique to the proximal right common carotid artery. Resistance was encountered to further advancement of the stent delivery apparatus. At this time the balloon guide catheter was advanced to the distal right common carotid artery. Further attempts were made to advance the stent delivery apparatus into the right internal carotid proximally. Advancement could only manage just distal to the angioplastied segment with no significant coverage of the angioplastied segment. Further attempts at advancing were met with resistance secondary to a step-off related to the calcified plaque. After multiple attempts it was decided to stop  the procedure. Final control arteriogram performed through the balloon guide catheter in the right common carotid artery continued to demonstrate excellent flow through the right external carotid artery with a mild-to-moderate stenosis at its origin. The right internal carotid artery proximally demonstrated approximately 60% patency which was maintained on subsequent 20 minute arteriogram. Centered intracranially demonstrated brisk flow into the right anterior cerebral artery and the right middle cerebral distribution. The previously occluded anterior branch of the inferior division proximally due to a calcified plaque remained stable. The delayed arterial phase demonstrated retrograde opacification of the posterior 2/3 of the cortical branches in the sylvian triangle from the branches of the inferior division and the superior division, and also from the pericallosal branches. The balloon guide catheter was removed. The 8 French Pinnacle sheath in the left common femoral artery was removed with manual compression held for approximately 25 minutes with a quick clot to achieved hemostasis at this site. Distal pulses remained palpable in the dorsalis pedis arteries bilaterally unchanged. An immediate CT of the brain demonstrated no evidence of hemorrhagic complications. The patient was then gradually extubated without difficulty. Patient was able to maintain his airway, and oxygen concentrations. He was able to move his right side spontaneously and to command. The patient was able to follow simple commands appropriately. No significant motor  movement was evident in the left upper extremity, with only a flicker in the left foot toes. Patient was then transferred to the PACU and then subsequently the neuro ICU for post revascularization care. Later in the afternoon, the patient appeared more awake, alert and appropriately responsive. Had mild intermittent right gaze deviation but was able to track his eyes to the left  past the midline. Otherwise, neurologically he remained stable unchanged. 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 PLAN: Follow-up in the clinic 4 to 6 weeks post discharge. Electronically Signed   By: Luanne Bras M.D.   On: 01/03/2022 08:12   DG Swallowing Func-Speech Pathology  Result Date: 01/02/2022 Table formatting from the original result was not included. Objective Swallowing Evaluation: Type of Study: MBS-Modified Barium Swallow Study  Patient Details Name: Chad [name] MRN: [id number] Date of Birth: 18-Dec-1931 Today's Date: 01/02/2022 Time: SLP Start Time (ACUTE ONLY): 1100 -SLP Stop Time (ACUTE ONLY): 1123 SLP Time Calculation (min) (ACUTE ONLY): 23 min Past Medical History: Past Medical History: Diagnosis Date  Arthritis   Heart murmur   asa child   Hypertension   Stroke [facility])  Past Surgical History: Past Surgical History: Procedure Laterality Date  EYE SURGERY Bilateral 2022  JOINT REPLACEMENT    RADIOLOGY WITH ANESTHESIA N/A 01/01/2022  Procedure: Angiogram;  Surgeon: Luanne Bras, MD;  Location: [place];  Service: Radiology;  Laterality: N/A;  TONSILLECTOMY    TOTAL KNEE ARTHROPLASTY Right 03/30/2013  Procedure: RIGHT TOTAL KNEE ARTHROPLASTY;  Surgeon: Tobi Bastos, MD;  Location: [facility];  Service: Orthopedics;  Laterality: Right;  TOTAL KNEE ARTHROPLASTY Left 10/09/2020  Procedure: TOTAL KNEE ARTHROPLASTY;  Surgeon: Paralee Cancel, MD;  Location: [facility];  Service: Orthopedics;  Laterality: Left;  70 mins HPI: 88 yo male presenting to the ED on 5/28 with L sided weakness, facial droop, slurred speech, and fall. 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 PMH including hypothyroidism chronic pain syndromes, CKD, HTN, arthritis, bil TKA.  Subjective: alert, friendly   Recommendations for follow up therapy are one component of a multi-disciplinary discharge planning process, led by the attending physician.  Recommendations may be updated based on patient status, additional functional criteria and insurance authorization. Assessment / Plan / Recommendation   01/02/2022  11:00 AM Clinical Impressions Clinical Impression Pt presents with oropharyngeal dysphagia characterized by reduced labial seal, impaired mastication, a pharyngeal delay and reduction in bolus cohesion, posterior bolus propulsion, tongue base retraction, anterior laryngeal movement, and pharyngeal constriction. He demonstrated left-sided anterior spillage, lingual residue, difficulty with A-P transport of solids, premature spillage to the valleculae and pyriform sinuses, and posterior pharyngeal wall residue. A liquid bolus was necessary to facilitate mastication and A-P transport of a nutrigrain bar. Liquid washes reduced pharyngeal residue to a more functional level and a left head turn was effective in reducing pyriform sinus residue with thin liquids, but pt exhibited notable difficulty maintaining this position. Penetration (PAS 3, 5) and aspiration (PAS 7, 8) were noted with thin liquids. Larger amounts of aspirate did trigger a cough which was mostly effective, but smaller quantities inconsistently triggered throat clearing which was inadequate for expulsion of material. No functional benefit was noted with prompted coughing. A dysphagia 1 diet with nectar thick liquids is recommended at this time. SLP will follow for dysphagia treatment. SLP Visit  Diagnosis Dysphagia, oropharyngeal phase (R13.12) Impact on safety and function Mild aspiration risk     01/02/2022  11:00 AM Treatment Recommendations Treatment Recommendations Therapy as outlined in treatment plan below     01/02/2022  11:00 AM Prognosis Prognosis for Safe Diet Advancement Good   01/02/2022  11:00 AM Diet Recommendations SLP Diet Recommendations  Dysphagia 1 (Puree) solids;Nectar thick liquid Liquid Administration via Cup;Straw Medication Administration Whole meds with puree Compensations Slow rate;Small sips/bites;Monitor for anterior loss;Follow solids with liquid Postural Changes Seated upright at 90 degrees     01/02/2022  11:00 AM Other Recommendations Other Recommendations Order thickener from pharmacy Follow Up Recommendations Acute inpatient rehab (3hours/day) Assistance recommended at discharge Frequent or constant Supervision/Assistance Functional Status Assessment Patient has had a recent decline in their functional status and demonstrates the ability to make significant improvements in function in a reasonable and predictable amount of time.   01/02/2022  11:00 AM Frequency and Duration  Speech Therapy Frequency (ACUTE ONLY) min 2x/week Treatment Duration 2 weeks     01/02/2022  11:00 AM Oral Phase Oral Phase Impaired Oral - Nectar Cup Left anterior bolus loss;Reduced posterior propulsion;Lingual/palatal residue;Delayed oral transit;Decreased bolus cohesion;Premature spillage Oral - Nectar Straw Left anterior bolus loss;Reduced posterior propulsion;Lingual/palatal residue;Delayed oral transit;Decreased bolus cohesion;Premature spillage Oral - Thin Cup Left anterior bolus loss;Reduced posterior propulsion;Lingual/palatal residue;Delayed oral transit;Decreased bolus cohesion;Premature spillage Oral - Thin Straw Left anterior bolus loss;Reduced posterior propulsion;Lingual/palatal residue;Delayed oral transit;Decreased bolus cohesion;Premature spillage Oral - Puree Left anterior bolus loss;Reduced posterior propulsion;Lingual/palatal residue;Delayed oral transit;Decreased bolus cohesion;Weak lingual manipulation Oral - Mech Soft Left anterior bolus loss;Reduced posterior propulsion;Lingual/palatal residue;Delayed oral transit;Decreased bolus cohesion;Weak lingual manipulation;Impaired mastication Oral - Pill Left anterior bolus loss;Reduced posterior  propulsion;Lingual/palatal residue;Delayed oral transit;Decreased bolus cohesion    01/02/2022  11:00 AM Pharyngeal Phase Pharyngeal Phase Impaired Pharyngeal- Thin Cup Reduced tongue base retraction;Reduced anterior laryngeal mobility;Delayed swallow initiation-pyriform sinuses;Reduced pharyngeal peristalsis;Penetration/Aspiration during swallow;Penetration/Apiration after swallow;Pharyngeal residue - valleculae;Pharyngeal residue - pyriform;Pharyngeal residue - posterior pharnyx Pharyngeal Material enters airway, remains ABOVE vocal cords and not ejected out;Material enters airway, CONTACTS cords and not ejected out;Material enters airway, passes BELOW cords and not ejected out despite cough attempt by patient;Material enters airway, passes BELOW cords without attempt by patient to eject out (silent aspiration) Pharyngeal- Thin Straw Reduced tongue base retraction;Reduced anterior laryngeal mobility;Delayed swallow initiation-pyriform sinuses;Reduced pharyngeal peristalsis;Penetration/Aspiration during swallow;Penetration/Apiration after swallow;Pharyngeal residue - valleculae;Pharyngeal residue - pyriform;Pharyngeal residue - posterior pharnyx Pharyngeal Material enters airway, passes BELOW cords and not ejected out despite cough attempt by patient;Material enters airway, passes BELOW cords without attempt by patient to eject out (silent aspiration);Material enters airway, CONTACTS cords and then ejected out Pharyngeal- Puree Reduced tongue base retraction;Reduced anterior laryngeal mobility;Delayed swallow initiation-pyriform sinuses;Reduced pharyngeal peristalsis;Pharyngeal residue - valleculae;Pharyngeal residue - pyriform;Pharyngeal residue - posterior pharnyx Pharyngeal- Mechanical Soft Reduced tongue base retraction;Reduced anterior laryngeal mobility;Delayed swallow initiation-pyriform sinuses;Reduced pharyngeal peristalsis;Pharyngeal residue - valleculae;Pharyngeal residue - pyriform;Pharyngeal residue -  posterior pharnyx Pharyngeal- Pill Reduced tongue base retraction;Reduced anterior laryngeal mobility;Delayed swallow initiation-pyriform sinuses;Reduced pharyngeal peristalsis;Pharyngeal residue - valleculae;Pharyngeal residue - pyriform;Pharyngeal residue - posterior pharnyx    01/02/2022  11:00 AM Cervical Esophageal Phase  Cervical Esophageal Phase [facility] Shanika I. Hardin Negus, [place], [place] Office number [phone] Pager [place] 01/02/2022, 12:20 PM                     VAS US CAROTID  Result Date: 12/31/2021 Carotid Arterial Duplex Study Patient Name:  Chad  Tresea [name]  Date of Exam:   12/30/2021 Medical Rec #: [id number]          Accession #:    [id number] Date of Birth: 09/12/1931           Patient Gender: M Patient Age:   18 years Exam Location:  [facility] Procedure:      VAS US CAROTID Referring Phys: Nyoka Lint DOUTOVA --------------------------------------------------------------------------------  Indications:       CVA. Risk Factors:      Hypertension. Comparison Study:  No prior study Performing Technologist: Maudry Mayhew MHA, RDMS, RVT, RDCS  Examination Guidelines: A complete evaluation includes B-mode imaging, spectral Doppler, color Doppler, and power Doppler as needed of all accessible portions of each vessel. Bilateral testing is considered an integral part of a complete examination. Limited examinations for reoccurring indications may be performed as noted.  Right Carotid Findings: +----------+-------+--------+--------+-------------------------------+---------+           PSV    EDV cm/sStenosisPlaque Description             Comments            cm/s                                                            +----------+-------+--------+--------+-------------------------------+---------+ CCA Prox  54     7               heterogenous and irregular                +----------+-------+--------+--------+-------------------------------+---------+ CCA Distal58     9               smooth and heterogenous                  +----------+-------+--------+--------+-------------------------------+---------+ ICA Prox  132    19              smooth, heterogenous and       Shadowing                                  calcific                                 +----------+-------+--------+--------+-------------------------------+---------+ ICA Distal78     15                                                       +----------+-------+--------+--------+-------------------------------+---------+ ECA       126                                                             +----------+-------+--------+--------+-------------------------------+---------+ +----------+--------+-------+----------------+-------------------+           PSV cm/sEDV cmsDescribe        Arm Pressure (mmHG) +----------+--------+-------+----------------+-------------------+ [id number]  Multiphasic, WNL                    +----------+--------+-------+----------------+-------------------+ +---------+--------+--+--------+-+---------+ VertebralPSV cm/s51EDV cm/s5Antegrade +---------+--------+--+--------+-+---------+  Left Carotid Findings: +----------+--------+--------+--------+------------------------------+---------+           PSV cm/sEDV cm/sStenosisPlaque Description            Comments  +----------+--------+--------+--------+------------------------------+---------+ CCA Prox  89                      heterogenous and irregular    tortuous  +----------+--------+--------+--------+------------------------------+---------+ CCA Distal70      8                                                       +----------+--------+--------+--------+------------------------------+---------+ ICA Prox  49      12              heterogenous, irregular and    Shadowing                                   calcific                                +----------+--------+--------+--------+------------------------------+---------+ ICA Distal98      18                                                      +----------+--------+--------+--------+------------------------------+---------+ ECA       67      10                                                      +----------+--------+--------+--------+------------------------------+---------+ +----------+--------+--------+----------------+-------------------+           PSV cm/sEDV cm/sDescribe        Arm Pressure (mmHG) +----------+--------+--------+----------------+-------------------+ [id number]             Multiphasic, WNL                    +----------+--------+--------+----------------+-------------------+ +---------+--------+--+--------+--+---------+ VertebralPSV cm/s94EDV cm/s14Antegrade +---------+--------+--+--------+--+---------+   Summary: Right Carotid: Velocities in the right ICA are consistent with a 1-39% stenosis. Left Carotid: Velocities in the left ICA are consistent with a 1-39% stenosis. Vertebrals:  Bilateral vertebral arteries demonstrate antegrade flow. Subclavians: Normal flow hemodynamics were seen in bilateral subclavian              arteries. *See table(s) above for measurements and observations.  Electronically signed by Antony Contras MD on 12/31/2021 at 3:59:42 PM.    Final    ECHOCARDIOGRAM COMPLETE  Result Date: 12/30/2021    ECHOCARDIOGRAM REPORT   Patient Name:   Chad [name] Date of Exam: 12/30/2021 Medical Rec #:  [id number]         Height:       66.0 in Accession #:    [id number]        Weight:  133.6 lb Date of Birth:  July 19, 1932          BSA:          1.685 m Patient Age:    63 years          BP:           161/84 mmHg Patient Gender: M                 HR:           66 bpm. Exam Location:  Inpatient Procedure: 2D Echo Indications:    stroke   History:        Patient has prior history of Echocardiogram examinations, most                 recent 04/25/2020. Chronic kidney disease.; Risk                 Factors:Hypertension and Dyslipidemia.  Sonographer:    Johny Chess RDCS Referring Phys: [place]  1. Left ventricular ejection fraction, by estimation, is 55 to 60%. The left ventricle has normal function. The left ventricle has no regional wall motion abnormalities. There is mild left ventricular hypertrophy. Left ventricular diastolic parameters are consistent with Grade I diastolic dysfunction (impaired relaxation).  2. Right ventricular systolic function is normal. The right ventricular size is normal.  3. Left atrial size was mildly dilated.  4. The mitral valve is normal in structure. Mild mitral valve regurgitation. No evidence of mitral stenosis.  5. The aortic valve is tricuspid. Aortic valve regurgitation is mild. Aortic valve sclerosis/calcification is present, without any evidence of aortic stenosis.  6. The inferior vena cava is normal in size with greater than 50% respiratory variability, suggesting right atrial pressure of 3 mmHg. FINDINGS  Left Ventricle: Left ventricular ejection fraction, by estimation, is 55 to 60%. The left ventricle has normal function. The left ventricle has no regional wall motion abnormalities. The left ventricular internal cavity size was normal in size. There is  mild left ventricular hypertrophy. Left ventricular diastolic parameters are consistent with Grade I diastolic dysfunction (impaired relaxation). Right Ventricle: The right ventricular size is normal. Right ventricular systolic function is normal. Left Atrium: Left atrial size was mildly dilated. Right Atrium: Right atrial size was normal in size. Pericardium: Trivial pericardial effusion is present. Mitral Valve: The mitral valve is normal in structure. Mild mitral annular calcification. Mild mitral valve regurgitation. No  evidence of mitral valve stenosis. Tricuspid Valve: The tricuspid valve is normal in structure. Tricuspid valve regurgitation is trivial. No evidence of tricuspid stenosis. Aortic Valve: The aortic valve is tricuspid. Aortic valve regurgitation is mild. Aortic regurgitation PHT measures 400 msec. Aortic valve sclerosis/calcification is present, without any evidence of aortic stenosis. Pulmonic Valve: The pulmonic valve was normal in structure. Pulmonic valve regurgitation is trivial. No evidence of pulmonic stenosis. Aorta: The aortic root is normal in size and structure. Venous: The inferior vena cava is normal in size with greater than 50% respiratory variability, suggesting right atrial pressure of 3 mmHg. IAS/Shunts: No atrial level shunt detected by color flow Doppler.  LEFT VENTRICLE PLAX 2D LVIDd:         5.20 cm   Diastology LVIDs:         3.10 cm   LV e' medial:    4.50 cm/s LV PW:         1.20 cm   LV E/e' medial:  12.0 LV IVS:  1.10 cm   LV e' lateral:   4.20 cm/s LVOT diam:     2.10 cm   LV E/e' lateral: 12.9 LV SV:         80 LV SV Index:   47 LVOT Area:     3.46 cm  RIGHT VENTRICLE             IVC RV S prime:     15.70 cm/s  IVC diam: 1.00 cm TAPSE (M-mode): 2.0 cm LEFT ATRIUM             Index        RIGHT ATRIUM           Index LA diam:        3.40 cm 2.02 cm/m   RA Area:     16.30 cm LA Vol (A2C):   79.2 ml 47.01 ml/m  RA Volume:   39.80 ml  23.62 ml/m LA Vol (A4C):   47.5 ml 28.20 ml/m LA Biplane Vol: 63.0 ml 37.40 ml/m  AORTIC VALVE LVOT Vmax:   102.00 cm/s LVOT Vmean:  67.600 cm/s LVOT VTI:    0.230 m AI PHT:      400 msec  AORTA Ao Root diam: 3.70 cm Ao Asc diam:  3.10 cm MITRAL VALVE MV Area (PHT): 4.60 cm    SHUNTS MV Decel Time: 165 msec    Systemic VTI:  0.23 m [name] Peak grad: 134.6 mmHg   Systemic Diam: 2.10 cm [name] Mean grad: 82.0 mmHg [name] Vmax:      580.00 cm/s [name] Vmean:     416.0 cm/s MV E velocity: 54.00 cm/s MV A velocity: 96.40 cm/s MV E/A ratio:  0.56 Kirk Ruths MD  Electronically signed by Kirk Ruths MD Signature Date/Time: 12/30/2021/2:40:23 PM    Final    CT ANGIO HEAD NECK W WO CM  Result Date: 12/30/2021 CLINICAL DATA:  Stroke/TIA, determine embolic source EXAM: CT ANGIOGRAPHY HEAD AND NECK TECHNIQUE: Multidetector CT imaging of the head and neck was performed using the standard protocol during bolus administration of intravenous contrast. Multiplanar CT image reconstructions and MIPs were obtained to evaluate the vascular anatomy. Carotid stenosis measurements (when applicable) are obtained utilizing NASCET criteria, using the distal internal carotid diameter as the denominator. RADIATION DOSE REDUCTION: This exam was performed according to the departmental dose-optimization program which includes automated exposure control, adjustment of the mA and/or kV according to patient size and/or use of iterative reconstruction technique. CONTRAST:  77m OMNIPAQUE IOHEXOL 350 MG/ML SOLN COMPARISON:  Same day MRI/MRA. FINDINGS: CT HEAD FINDINGS Brain: Small right MCA territory infarcts better characterized on same day MRI. No evidence of acute hemorrhage, mass effect, midline shift, extra-axial fluid collection, or hydrocephalus. Patchy white matter hypodensities, nonspecific but compatible with chronic microvascular ischemic disease. Vascular: Detailed below. Skull: No acute fracture. Sinuses: Mild paranasal sinus mucosal thickening. Orbits: No acute orbital findings. Review of the MIP images confirms the above findings CTA NECK FINDINGS Aortic arch: Calcific atherosclerosis of the aorta and great vessel origins. Ectatic right subclavian artery. Right carotid system: Atherosclerosis at the carotid bifurcation with approximately 80% stenosis. Left carotid system: Atherosclerosis at the carotid bifurcation without greater than 50% stenosis. Vertebral arteries: Potentially severe stenosis of the right vertebral artery origin due to calcific atherosclerosis. Otherwise,  vertebral arteries are patent without significant (greater than 50%) stenosis. Left dominant. Skeleton: Severe multilevel degenerative change. Other neck: No acute findings. Upper chest: Clear sinuses. Review of the MIP images confirms the above  findings CTA HEAD FINDINGS Anterior circulation: Bilateral intracranial ICAs are patent with mild narrowing due to calcific atherosclerosis. Bilateral M1 MCAs and left M2 MCAs are patent. Apparent occlusion of a right M2 MCA branch in a region of calcification. Bilateral ACAs are patent. Posterior circulation: Left dominant intradural vertebral artery. Right intradural vertebral artery makes very small contribution to the basilar artery. Basilar artery and bilateral posterior cerebral arteries are patent without proximal hemodynamically significant stenosis. Venous sinuses: As permitted by contrast timing, patent. Anatomic variants: Detailed above. Review of the MIP images confirms the above findings IMPRESSION: CTA: 1. 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 2. Extensive calcific atherosclerosis at the right carotid bifurcation with approximately 80% stenosis of the proximal ICA. 3. 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 CT head: Small right MCA territory infarcts better characterized on same day MRI. No evidence of progressive mass effect or acute hemorrhage. Findings discussed with Dr. Erlinda Hong via telephone at 12:50 p.m. Electronically Signed   By: Margaretha Sheffield M.D.   On: 12/30/2021 13:00   [name] BRAIN WO CONTRAST  Result Date: 12/30/2021 CLINICAL DATA:  There are deficit with acute stroke suspected EXAM: MRI HEAD WITHOUT CONTRAST MRA HEAD WITHOUT CONTRAST TECHNIQUE: Multiplanar, multi-echo pulse sequences of the brain and surrounding structures were acquired without  intravenous contrast. Angiographic images of the Circle of Willis were acquired using MRA technique without intravenous contrast. COMPARISON:  Head CT from yesterday FINDINGS: MRI HEAD FINDINGS Brain: Cluster of small acute infarcts in the right insular cortex, right frontal cortex, and deep right cerebral white matter. These are in a MCA distribution. Background of advanced chronic small vessel ischemia with confluent gliosis in the hemispheric white matter. Chronic lacunar infarcts in the deep cerebrum. Small remote left cerebral infarct. Gradient signal at the lower right sylvian fissure with there is calcification along the MCA branches by CT. Vascular: Major flow voids are preserved Skull and upper cervical spine: Normal marrow signal Sinuses/Orbits: Bilateral cataract resection MRA HEAD FINDINGS Anterior circulation: High-grade narrowing involving the upper right M2 branch with there is a gradient hypointensity noted above. Elsewhere vessels are smoothly contoured and widely patent. Posterior circulation: Vertebrobasilar arteries are smoothly contoured and widely patent. IMPRESSION: Brain MRI: 1. Cluster of small cortical and white matter infarcts within the right MCA distribution. 2. Background of advanced chronic small vessel ischemia. Intracranial MRA: Focal high-grade narrowing at the right M2 level where there is calcification by CT. This could reflect calcified plaque or a calcified embolus. If an embolus, the proximal nature and degree of mild infarct with suggest additional territory of risk. Electronically Signed   By: Jorje Guild M.D.   On: 12/30/2021 07:53   [name] ANGIO HEAD WO CONTRAST  Result Date: 12/30/2021 CLINICAL DATA:  There are deficit with acute stroke suspected EXAM: MRI HEAD WITHOUT CONTRAST MRA HEAD WITHOUT CONTRAST TECHNIQUE: Multiplanar, multi-echo pulse sequences of the brain and surrounding structures were acquired without intravenous contrast. Angiographic images of the Circle  of Willis were acquired using MRA technique without intravenous contrast. COMPARISON:  Head CT from yesterday FINDINGS: MRI HEAD FINDINGS Brain: Cluster of small acute infarcts in the right insular cortex, right frontal cortex, and deep right cerebral white matter. These are in a MCA distribution. Background of advanced chronic small vessel ischemia with confluent gliosis in the hemispheric white matter. Chronic lacunar infarcts  in the deep cerebrum. Small remote left cerebral infarct. Gradient signal at the lower right sylvian fissure with there is calcification along the MCA branches by CT. Vascular: Major flow voids are preserved Skull and upper cervical spine: Normal marrow signal Sinuses/Orbits: Bilateral cataract resection MRA HEAD FINDINGS Anterior circulation: High-grade narrowing involving the upper right M2 branch with there is a gradient hypointensity noted above. Elsewhere vessels are smoothly contoured and widely patent. Posterior circulation: Vertebrobasilar arteries are smoothly contoured and widely patent. IMPRESSION: Brain MRI: 1. Cluster of small cortical and white matter infarcts within the right MCA distribution. 2. Background of advanced chronic small vessel ischemia. Intracranial MRA: Focal high-grade narrowing at the right M2 level where there is calcification by CT. This could reflect calcified plaque or a calcified embolus. If an embolus, the proximal nature and degree of mild infarct with suggest additional territory of risk. Electronically Signed   By: Jorje Guild M.D.   On: 12/30/2021 07:53   DG Chest Portable 1 View  Result Date: 12/29/2021 CLINICAL DATA:  CVA. Fell last night. Family concerned patient injured left side EXAM: PORTABLE CHEST 1 VIEW COMPARISON:  Chest two views 03/23/2013 FINDINGS: Cardiac silhouette is mildly enlarged. Moderate calcifications within the aortic arch and descending thoracic aorta. Minimal bibasilar horizontal linear chronic scarring is unchanged. No  focal airspace opacity to indicate pneumonia. No pleural effusion or pneumothorax. Moderate multilevel degenerative disc changes of the thoracic spine. IMPRESSION: Mild stable cardiomegaly. No acute lung process. Electronically Signed   By: Yvonne Kendall M.D.   On: 12/29/2021 19:19   DG Hand Complete Left  Result Date: 12/29/2021 CLINICAL DATA:  Fall.  Bruising to posterior left hand. EXAM: LEFT HAND - COMPLETE 3+ VIEW COMPARISON:  None Available. FINDINGS: There is diffuse decreased bone mineralization. A pulse oximeter overlies the distal index finger obscuring portions. Degenerative changes including joint space narrowing, subchondral sclerosis and peripheral osteophytosis are moderate at the triscaphe joint; moderate to severe at the thumb carpometacarpal, thumb metacarpophalangeal, and thumb interphalangeal joints; and moderate to severe at the DIP joints and moderate at the PIP joints of the second through fifth fingers. No acute fracture is seen. No dislocation. IMPRESSION: Osteoarthritis as above. No acute fracture. Electronically Signed   By: Yvonne Kendall M.D.   On: 12/29/2021 18:12   DG Elbow Complete Left  Result Date: 12/29/2021 CLINICAL DATA:  Fall.  Left elbow pain and bruising. EXAM: LEFT ELBOW - COMPLETE 3+ VIEW COMPARISON:  None Available. FINDINGS: No fracture or dislocation. Narrowed radiocapitellar joint. Marginal osteophytes project from the base of the radial head. No joint effusion. There is soft tissue edema posteriorly. IMPRESSION: No fracture or dislocation. Electronically Signed   By: Lajean Manes M.D.   On: 12/29/2021 18:12   CT Cervical Spine Wo Contrast  Result Date: 12/29/2021 CLINICAL DATA:  There logic deaf sick, neck trauma EXAM: CT CERVICAL SPINE WITHOUT CONTRAST TECHNIQUE: Multidetector CT imaging of the cervical spine was performed without intravenous contrast. Multiplanar CT image reconstructions were also generated. RADIATION DOSE REDUCTION: This exam was  performed according to the departmental dose-optimization program which includes automated exposure control, adjustment of the mA and/or kV according to patient size and/or use of iterative reconstruction technique. COMPARISON:  None Available. FINDINGS: Alignment: Mild degenerative anterolisthesis of C2 and C3. Otherwise alignment is anatomic. Skull base and vertebrae: No acute fracture. No primary bone lesion or focal pathologic process. Soft tissues and spinal canal: No prevertebral fluid or swelling. No visible canal hematoma. Disc levels:  Partial bony fusion across the disc spaces at C3-4 and C4-5. There is severe multilevel spondylosis throughout the remainder of the cervical spine, greatest at C5-6 and C6-7. Diffuse facet hypertrophy. Upper chest: Airway is patent. Lung apices are clear. Prominent atherosclerosis of the aortic arch. Other: Reconstructed images demonstrate no additional findings. IMPRESSION: 1. Extensive multilevel cervical degenerative changes. No acute fracture. Electronically Signed   By: Randa Ngo M.D.   On: 12/29/2021 17:38   CT HEAD WO CONTRAST  Result Date: 12/29/2021 CLINICAL DATA:  Neurologic deficit EXAM: CT HEAD WITHOUT CONTRAST TECHNIQUE: Contiguous axial images were obtained from the base of the skull through the vertex without intravenous contrast. RADIATION DOSE REDUCTION: This exam was performed according to the departmental dose-optimization program which includes automated exposure control, adjustment of the mA and/or kV according to patient size and/or use of iterative reconstruction technique. COMPARISON:  None Available. FINDINGS: Brain: Confluent hypodensities throughout the periventricular white matter are most consistent with age-indeterminate small vessel ischemic changes, likely chronic. Focal hypodensities in the left basal ganglia and right frontal periventricular white matter consistent with chronic lacunar infarcts. Age-indeterminate lacunar infarct is  seen within the right basal ganglia image 18/3. No other signs of acute infarct or hemorrhage. Lateral ventricles and midline structures are otherwise unremarkable. No acute extra-axial fluid collections. No mass effect. Vascular: No hyperdense vessel or unexpected calcification. Skull: Normal. Negative for fracture or focal lesion. Sinuses/Orbits: No acute finding. Other: None. IMPRESSION: 1. Age indeterminate lacunar infarct within the right basal ganglia. 2. Age-indeterminate small-vessel ischemic changes throughout the periventricular white matter, favor chronic. 3. Chronic left basal ganglia and right frontal white matter lacunar infarcts as above. 4. No acute hemorrhage. Electronically Signed   By: Randa Ngo M.D.   On: 12/29/2021 17:35     PHYSICAL EXAM  Temp:  [97.7 F (36.5 C)-98.4 F (36.9 C)] 98 F (36.7 C) (06/19 1156) Pulse Rate:  [52-84] 73 (06/19 1200) Resp:  [14-24] 18 (06/19 1200) BP: (95-166)/(45-85) 148/62 (06/19 1200) SpO2:  [98 %-100 %] 100 % (06/19 1200) FiO2 (%):  [30 %] 30 % (06/19 1059)  General - Well nourished, well developed, intubated on precedex.  Ophthalmologic - fundi not visualized due to noncooperation.  Cardiovascular - Regular rate and rhythm.  Neuro - intubated on precedex, eyes closed but open on voice, following few commands on the right side only  . With eye opening, eyes in mid position, inconsistently blinking to visual threat, doll's eyes present, not tracking.  Corneal reflex present, gag and cough present. Breathing over the vent.  Facial symmetry not able to test due to ET tube.  Tongue protrusion not cooperative. Spontaneous moving right UE against gravity and LE with knee flexion, but left upper extremity flaccid, left lower extremity flicker toe movement with painful stimuli. DTR diminished and no babinski. Sensation, coordination not cooperative and gait not tested.    ASSESSMENT/PLAN Chad [name] is a 88 y.o. male with history  of hypertension, hyperlipidemia, hearing loss, recent stroke status post right carotid angioplasty transfer back from CIR due to aspiration, respite failure.  Found to have severe anemia with GI bleeding, renal failure and seizure-like activity.  Stroke:  right MCA infarct extended from recent infarct, likely secondary to hypertension in the setting of sepsis and GI bleeding with known right ICA stenosis s/p angioplasty  Recent stroke: right MCA scattered small infarcts due to right M2 occlusion by calcified plaque,  secondary to large vessel disease source from right ICA high-grade  stenosis CT 5/28 no acute abnormality, old bilateral BG, right frontal white matter lacunar infarcts. MRA right M 2 high-grade stenosis MRI 5/29 right MCA scattered small infarcts CT head and neck 5/29 proximal right M2 occlusion due to calcified embolus. Extensive calcified atherosclerosis at right ICA bifurcation with 80% stenosis.  Severe stenosis nondominant right VA origin.  CT head 6/15 - moderate-sized acute/subacute cortical and subcortical infarct within the right frontoparietal lobes and right insula  MRI and MRA pending CUS repeat pending 2D Echo EF 55 to 60% LDL 90 HgbA1c 6.1 aspirin 81 mg daily and Brilinta (ticagrelor) 90 mg bid prior to admission, now on aspirin 81 mg daily and Brilinta (ticagrelor) 90 mg bid after anemia stabilized Ongoing aggressive stroke risk factor management Therapy recommendations:  pending Disposition: Pending  Seizure like activity 6/15 episode of left gaze, right upper extremity posturing, status post Ativan EEG stat no seizure, moderate to severe diffuse encephalopathy Off LTM  Respiratory failure Aspiration pneumonia on Unasyn CCM on board Intubated for airway protection Vent management per CCM  Carotid stenosis CT head and neck 5/29 extensive calcified atherosclerosis at right ICA bifurcation with 80% stenosis. Likely the source for right M2 occlusion and  right MCA stroke Dr. Estanislado Pandy did right carotid angioplasty 5/31, not able to have stent placement due to tortuosity of the vessel Continue Brilinta 90 mg twice daily and aspirin 81 mg daily if no more GI bleeding GIB Large dark stool 6/15 Severe anemia Hb 8.2->8.0->5.6->PRBC->9.9-> 9.5->9.5 GI on board EGD 6/16 found food in stomach and no significance and source of bleeding Close monitoring  History of hypertension Hypotension, improved Likely due to septic shock and severe anemia On IV fluid, received PRBC transfusion Off Levophed BP goal normotensive   Hyperlipidemia Home meds: Lovastatin 40 LDL 90, goal < 70 Now on Crestor 20 Continue statin at discharge   AKI on CKD Creatinine 1.3->3.84->3.24->3.12->2.95 CCM on board On TF @ 50 Avoid nephrotoxicity agents  Dysphagia On tube feeding now  Other Stroke Risk Factors Advanced age   Other Active Problems Hard of hearing  Hospital day # 5 Patient remains hemodynamically stable and follow-up imaging showed no new extension of right ACA infarct but also 2 additional punctate embolic infarcts which could be postprocedural or from a central cardiac source.  Family meeting planned for later this afternoon and family want to be aggressive patient may need 30-day heart monitor at discharge for A-fib.  Continue Brilinta and aspirin if he can tolerate them otherwise Brilinta alone.  Extubated if tolerated as per CCM team.  Discussed with Dr. Lake Bells. This patient is critically ill due to right MCA infarct extension, right ICA stenosis, respiratory failure, intubated on vent, aspiration pneumonia, GIB with severe anemia and at significant risk of neurological worsening, death form recurrent stroke, hemorrhagic conversion, severe anemia, respiratory failure. This patient's care requires constant monitoring of vital signs, hemodynamics, respiratory and cardiac monitoring, review of multiple databases, neurological assessment, discussion  with family, other specialists and medical decision making of high complexity. I spent 35 minutes of neurocritical care time in the care of this patient. I discussed with Dr. Lake Bells.   Antony Contras, MD  Stroke Neurology 01/20/2022 1:15 PM      To contact Stroke Continuity provider, please refer to [email or web address] After hours, contact General Neurology

## 2022-01-20 NOTE — Discharge Summary (Signed)
Physical Therapy Discharge Note  This patient was unable to complete the inpatient rehab program due to transferring back to acute floor in setting of respiratory distress; therefore did not meet their long term goals. Pt left the program at a total assist of 2 level for their functional mobility/ transfers. This patient is being discharged from PT services at this time.  [name] perception of pain in the last five days was unable to answer at this time.    See CareTool for functional status details  If the patient is able to return to inpatient rehabilitation within 3 midnights, this may be considered an interrupted stay and therapy services will resume as ordered. Modification and reinstatement of their goals will be made upon completion of therapy service reevaluations.   Ginnie Smart, PT, DPT

## 2022-01-20 NOTE — Progress Notes (Signed)
OT Cancellation Note  Patient Details Name: Chad [name] MRN: [id number] DOB: [birth year]   Cancelled Treatment:    Reason Eval/Treat Not Completed: Medical issues which prohibited therapy. On vent. Not following commands. Will return as schedule allows.   [place], OTR/L Acute Rehab Office: [phone] 01/20/2022, 1:52 PM

## 2022-01-20 NOTE — IPAL (Signed)
  Interdisciplinary Goals of Care Family Meeting   Date carried out: 01/20/2022  Location of the meeting: Bedside  Member's involved: Physician, Bedside Registered Nurse, and Family Member or next of kin  Durable Power of Attorney or acting medical decision maker: Multiple children (all 6 of his children were present either in person or by phone)    Discussion: We discussed goals of care for Chad [name] .  We discussed his condition at length and discussed the possibility of tracheostomy should he fail extubation.  The family decided that they would not want to pursue tracheostomy or re-intubation should he develop respiratory failure after planned extubation.  They understand that his code status will be changed to DNR and he would not be resuscitated in the event of a cardiac arrest.  Code status: Full DNR  Disposition: Continue current acute care  Time spent for the meeting: 45 minutes    Roselie Awkward, MD  01/20/2022, 2:55 PM

## 2022-01-21 LAB — CBC
HCT: 25.8 % — ABNORMAL LOW (ref 39.0–52.0)
HCT: 29.5 % — ABNORMAL LOW (ref 39.0–52.0)
Hemoglobin: 8.4 g/dL — ABNORMAL LOW (ref 13.0–17.0)
Hemoglobin: 9.3 g/dL — ABNORMAL LOW (ref 13.0–17.0)
MCH: 29.3 pg (ref 26.0–34.0)
MCH: 28.6 pg (ref 26.0–34.0)
MCHC: 32.6 g/dL (ref 30.0–36.0)
MCHC: 31.5 g/dL (ref 30.0–36.0)
MCV: 89.9 fL (ref 80.0–100.0)
MCV: 90.8 fL (ref 80.0–100.0)
Platelets: 227 10*3/uL (ref 150–400)
Platelets: 268 10*3/uL (ref 150–400)
RBC: 2.87 MIL/uL — ABNORMAL LOW (ref 4.22–5.81)
RBC: 3.25 MIL/uL — ABNORMAL LOW (ref 4.22–5.81)
RDW: 16.9 % — ABNORMAL HIGH (ref 11.5–15.5)
RDW: 16.9 % — ABNORMAL HIGH (ref 11.5–15.5)
WBC: 6.3 10*3/uL (ref 4.0–10.5)
WBC: 6.7 10*3/uL (ref 4.0–10.5)
nRBC: 0 % (ref 0.0–0.2)
nRBC: 0 % (ref 0.0–0.2)

## 2022-01-21 LAB — COMPREHENSIVE METABOLIC PANEL
ALT: 15 U/L (ref 0–44)
AST: 23 U/L (ref 15–41)
Albumin: 2.1 g/dL — ABNORMAL LOW (ref 3.5–5.0)
Alkaline Phosphatase: 63 U/L (ref 38–126)
Anion gap: 14 (ref 5–15)
BUN: 58 mg/dL — ABNORMAL HIGH (ref 8–23)
CO2: 27 mmol/L (ref 22–32)
Calcium: 8 mg/dL — ABNORMAL LOW (ref 8.9–10.3)
Chloride: 109 mmol/L (ref 98–111)
Creatinine, Ser: 2.91 mg/dL — ABNORMAL HIGH (ref 0.61–1.24)
GFR, Estimated: 20 mL/min — ABNORMAL LOW (ref >60)
Glucose, Bld: 112 mg/dL — ABNORMAL HIGH (ref 70–99)
Potassium: 3.2 mmol/L — ABNORMAL LOW (ref 3.5–5.1)
Sodium: 150 mmol/L — ABNORMAL HIGH (ref 135–145)
Total Bilirubin: 1 mg/dL (ref 0.3–1.2)
Total Protein: 6.6 g/dL (ref 6.5–8.1)

## 2022-01-21 LAB — GLUCOSE, CAPILLARY
Glucose-Capillary: 102 mg/dL — ABNORMAL HIGH (ref 70–99)
Glucose-Capillary: 105 mg/dL — ABNORMAL HIGH (ref 70–99)
Glucose-Capillary: 108 mg/dL — ABNORMAL HIGH (ref 70–99)
Glucose-Capillary: 109 mg/dL — ABNORMAL HIGH (ref 70–99)
Glucose-Capillary: 120 mg/dL — ABNORMAL HIGH (ref 70–99)
Glucose-Capillary: 143 mg/dL — ABNORMAL HIGH (ref 70–99)

## 2022-01-21 MED ORDER — POTASSIUM CHLORIDE 20 MEQ PO PACK
40.0000 meq | PACK | Freq: Once | ORAL | Status: AC
  Administered 2022-01-21: 40 meq
  Filled 2022-01-21: qty 2

## 2022-01-21 MED ORDER — FREE WATER
200.0000 mL | Status: DC
  Administered 2022-01-21 – 2022-01-22 (×6): 200 mL

## 2022-01-21 NOTE — Progress Notes (Signed)
Nutrition Follow-up  DOCUMENTATION CODES:   Non-severe (moderate) malnutrition in context of chronic illness  INTERVENTION:   Continue tube feeds via Cortrak: - Advance Osmolite 1.5 by 10 ml q 6 hours to goal rate of 50 ml/hr (1200 ml/day) - ProSource TF 45 ml daily - Free water per CCM, currently 200 ml q 4 hours   Tube feeding regimen at goal rate provides 1840 kcal, 86 grams of protein, and 914 ml of H2O.  Total free water with flushes: 2114 ml  NUTRITION DIAGNOSIS:   Moderate Malnutrition related to chronic illness (CKD stage IIIa, worsening dysphagia) as evidenced by moderate fat depletion, severe muscle depletion.  Ongoing, being addressed via TF  GOAL:   Patient will meet greater than or equal to 90% of their needs  Progressing with advancement of TF to goal  MONITOR:   Vent status, Labs, Weight trends, I & O's  REASON FOR ASSESSMENT:   Ventilator    ASSESSMENT:   88 year old male who was admitted to acute care from CIR on 6/14 with SOB. PMH of HTN, recent admission for stroke with left-sided hemiplegia s/p balloon angioplasty of ICA, CKD stage IIIa, HLD. While in CIR, pt had Cortrak placed for tube feeds due to worsening dysphagia. Pt admitted with acute hypoxic respiratory failure, aspiration pneumonia, AKI on CKD.  06/14 - intubated 06/16 - s/p EGD with findings of gastric polyps and erythematous mucosa in antrum, Cortrak replaced (tip distal stomach), tube feeds started 06/18 - pt vomiting, TF held, KUB normal 06/19 - TF restarted at trickle rate, pt self-extubated  Discussed pt with RN and during ICU rounds. Pt self-extubated yesterday. No plans for reintubation and pt will be comfort care if decompensates.  Pt currently tolerating tube feeds at 30 ml/hr. RN slowly advancing to goal rate of 50 ml/hr. SLP has been consulted. Pt previously was NPO due to significant dysphagia. Will continue with tube feeds at this time. CCM managing free water flushes due to  hypernatremia.  Admit weight: 66.8 kg Current weight: 62 kg  Pt with mild pitting edema to BUE.  Current TF: Osmolite 1.5 @ 30 ml/hr, ProSource TF 45 ml daily, free water flushes of 200 ml q 4 hours  Medications reviewed and include: colace, SSI q 4 hours, protonix, miralax  Labs reviewed: sodium 150, potassium 3.2 (pt received klor-con 40 mEq once), BUN 58, creatinine 2.91, hemoglobin 8.4 CBG's: 102-113 x 24 hours  UOP: 2000 ml x 24 hours I/O's: -5.5 L since admit  Diet Order:   Diet Order     None       EDUCATION NEEDS:   Not appropriate for education at this time  Skin:  Skin Assessment: Reviewed RN Assessment  Last BM:  01/21/22 type 7  Height:   Ht Readings from Last 1 Encounters:  01/02/22 5\' 1"  (1.549 m)    Weight:   Wt Readings from Last 1 Encounters:  01/21/22 62 kg    BMI:  Body mass index is 25.83 kg/m.  Estimated Nutritional Needs:   Kcal:  1700-1900  Protein:  85-95 grams  Fluid:  1.7 L    Gustavus Bryant, MS, RD, LDN Inpatient Clinical Dietitian Please see [facility] for contact information.

## 2022-01-21 NOTE — Evaluation (Signed)
Clinical/Bedside Swallow Evaluation Patient Details  Name: Chad [name] MRN: [id number] Date of Birth: 02-23-1932  Today's Date: 01/21/2022 Time: SLP Start Time (ACUTE ONLY): 27 SLP Stop Time (ACUTE ONLY): 0277 SLP Time Calculation (min) (ACUTE ONLY): 15 min  Past Medical History:  Past Medical History:  Diagnosis Date   Arthritis    Heart murmur    asa child    Hypertension    Stroke [facility])    Past Surgical History:  Past Surgical History:  Procedure Laterality Date   ESOPHAGOGASTRODUODENOSCOPY (EGD) WITH PROPOFOL N/A 01/09/2022   Procedure: ESOPHAGOGASTRODUODENOSCOPY (EGD) WITH PROPOFOL;  Surgeon: Ronnette Juniper, MD;  Location: [place];  Service: Gastroenterology;  Laterality: N/A;   EYE SURGERY Bilateral 2022   IR ANGIO INTRA EXTRACRAN SEL COM CAROTID INNOMINATE UNI R MOD SED  01/01/2022   IR ANGIOGRAM EXTREMITY RIGHT  12/31/2021   IR CT HEAD LTD  01/01/2022   IR PTA INTRACRANIAL  01/01/2022   JOINT REPLACEMENT     RADIOLOGY WITH ANESTHESIA N/A 01/01/2022   Procedure: Angiogram;  Surgeon: Luanne Bras, MD;  Location: [place];  Service: Radiology;  Laterality: N/A;   TONSILLECTOMY     TOTAL KNEE ARTHROPLASTY Right 03/30/2013   Procedure: RIGHT TOTAL KNEE ARTHROPLASTY;  Surgeon: Tobi Bastos, MD;  Location: [facility];  Service: Orthopedics;  Laterality: Right;   TOTAL KNEE ARTHROPLASTY Left 10/09/2020   Procedure: TOTAL KNEE ARTHROPLASTY;  Surgeon: Paralee Cancel, MD;  Location: [facility];  Service: Orthopedics;  Laterality: Left;  70 mins   HPI:  Patient is an 88 y.o. male with PMH: HTN, arthritis, CVA who was recently admitted for CVA with left sided hemiplegia, had eventually transferred to inpatient rehab, had Cortrak feeding tube placed 5 days ago, was found to be in acute respiratory distress morning of 01/09/2022 with CXR showing possible edema versus infiltrates. MRI showed right MCA infarct extended from recent infarct with infarcts involving right frontal operculum,  middle frontal gyrus, left frontal lobe and right occipital lobe. Rapid response was called and patient had suction performed with fluids suctioned out appearing to be same as tube feedings; feedings were stopped. He was placed on BiPAP and admitted to ICU in setting of aspiration of tube feedings leading to acute respiratory failure with hypoxemia. EGD on 6/16 showed "EGD: gastric polyps noted, erythema of gastric antrum, no ulcer, food in esophagus and stomach".  He was intubated on arrival and self-extubated 6/19 with no plans to reintubate if needed as per [place].    Assessment / Plan / Recommendation  Clinical Impression  Patient presents with clinical s/s of dysphagia as per this bedside/clinical swallow evaluation. In addition, patient previously was on a modified diet in inpatient rehab of Dys1, nectar thick liquids (MBS on 6/1). SLP was in room with RN present as well with both providing suctioning to remove thick clear-whitish secretions from oral cavity. Patient able to follow basic directions for oral care to open mouth, stick out tongue, and even to attempt to cough and clear some secretions. He kept his eyes mainly closed and although he mouthed some responses when RN asking him name, and orientation questions no voicing heard. He was able to move secretions from pharynx to oral cavity even though cough was weak. Patient would often purse lips or open mouth slightly to indicate he wanted suctioning. His SpO2 and RR both were [facility] throughout session. SLP is recommending to continue with NPO status at this time but will f/u next date to determine if  patient able to safely try some PO's. SLP Visit Diagnosis: Dysphagia, unspecified (R13.10)    Aspiration Risk  Risk for inadequate nutrition/hydration;Severe aspiration risk    Diet Recommendation Alternative means - temporary   Medication Administration: Via alternative means    Other  Recommendations Oral Care Recommendations: Oral care  QID;Staff/trained caregiver to provide oral care Other Recommendations: Have oral suction available    Recommendations for follow up therapy are one component of a multi-disciplinary discharge planning process, led by the attending physician.  Recommendations may be updated based on patient status, additional functional criteria and insurance authorization.  Follow up Recommendations Follow physician's recommendations for discharge plan and follow up therapies      Assistance Recommended at Discharge Frequent or constant Supervision/Assistance  Functional Status Assessment Patient has had a recent decline in their functional status and demonstrates the ability to make significant improvements in function in a reasonable and predictable amount of time.  Frequency and Duration min 2x/week  2 weeks       Prognosis Prognosis for Safe Diet Advancement: Fair Barriers to Reach Goals: Time post onset;Severity of deficits      Swallow Study   General Date of Onset: 01/21/22 HPI: Patient is an 88 y.o. male with PMH: HTN, arthritis, CVA who was recently admitted for CVA with left sided hemiplegia, had eventually transferred to inpatient rehab, had Cortrak feeding tube placed 5 days ago, was found to be in acute respiratory distress morning of 01/14/2022 with CXR showing possible edema versus infiltrates. MRI showed right MCA infarct extended from recent infarct with infarcts involving right frontal operculum, middle frontal gyrus, left frontal lobe and right occipital lobe. Rapid response was called and patient had suction performed with fluids suctioned out appearing to be same as tube feedings; feedings were stopped. He was placed on BiPAP and admitted to ICU in setting of aspiration of tube feedings leading to acute respiratory failure with hypoxemia. EGD on 6/16 showed "EGD: gastric polyps noted, erythema of gastric antrum, no ulcer, food in esophagus and stomach".  He was intubated on arrival and  self-extubated 6/19 with no plans to reintubate if needed as per [place]. Type of Study: Bedside Swallow Evaluation Previous Swallow Assessment: 01/10/22, MBS on 01/02/22 Diet Prior to this Study: NPO Temperature Spikes Noted: No Respiratory Status: Nasal cannula History of Recent Intubation: Yes Length of Intubations (days): 6 days Date extubated: 01/20/22 Behavior/Cognition: Alert;Requires cueing;Cooperative Oral Cavity Assessment: Excessive secretions Oral Care Completed by SLP: Yes Oral Cavity - Dentition: Edentulous Patient Positioning: Upright in bed Baseline Vocal Quality: Wet;Other (comment) (very minimal voicing, mostly mouthing words) Volitional Cough: Congested;Weak Volitional Swallow: Able to elicit    Oral/Motor/Sensory Function Overall Oral Motor/Sensory Function: Moderate impairment Facial ROM: Reduced left Facial Symmetry: Abnormal symmetry left Facial Strength: Reduced left Lingual ROM: Reduced right;Reduced left Lingual Strength: Reduced   Ice Chips Ice chips: Not tested   Thin Liquid Thin Liquid: Not tested    Nectar Thick Nectar Thick Liquid: Not tested   Honey Thick Honey Thick Liquid: Not tested   Puree Puree: Not tested   Solid     Solid: Not tested      Sonia Baller, MA, CCC-SLP Speech Therapy

## 2022-01-21 NOTE — Care Management Important Message (Signed)
Important Message  Patient Details  Name: Chad [name] MRN: [id number] Date of Birth: 05/06/1932   Medicare Important Message Given:  No   CORRECTION DISREGARD PREVIOUS NOTE. NO IM GIVEN   [name] 01/21/2022, 3:42 PM

## 2022-01-21 NOTE — Progress Notes (Signed)
Dr. Lamonte Sakai notified of pts rectal bleeding. CBC ordered. No other orders received. Vitals are stable

## 2022-01-21 NOTE — Evaluation (Signed)
Occupational Therapy Evaluation Patient Details Name: Chad [name] MRN: [id number] DOB: [birth year] Today's Date: 01/21/2022   History of Present Illness  88 yo male presenting from inpatient rehab with respiratory distress on 6/14. Xray showing possible edema vs infiltrates. Intubated 6/14-6/19; self extubated. Recent admission 5/29-6/1 with CVA and underwent balloon angioplasty of ICA. Found to have severe anemia with GI bleeding. MRI on 6/18 showing 1) acute/subacute nonhemorrhagic infarct involving the right frontal operculum, right middle frontal gyrus, left frontal lobe and right occipital lobe, 2) Remote lacunar infarcts of the basal ganglia and thalami bilaterally, and 3) Remote lacunar infarcts in the cerebellum. PMH including hypertension, hyperlipidemia, hearing loss.   Clinical Impression   PTA, pt was from rehab after recent CVA impacting his L side, cognition, vision, and balance. Pt currently requiring Total A for ADLs and bed mobility. Pt tolerating sitting at EOB for ~10 minutes with Total A for support; presenting with L lateral lean. Pt with decreased active movement of LUE, sitting balance, cognition, vision, and activity tolerance. VSS on 2L.  Pt would benefit from further acute OT to facilitate safe dc. Recommend dc to SNF for further OT to optimize safety, independence with ADLs, and return to PLOF.      Recommendations for follow up therapy are one component of a multi-disciplinary discharge planning process, led by the attending physician.  Recommendations may be updated based on patient status, additional functional criteria and insurance authorization.   Follow Up Recommendations  Skilled nursing-short term rehab (<3 hours/day)    Assistance Recommended at Discharge Frequent or constant Supervision/Assistance  Patient can return home with the following Two people to help with bathing/dressing/bathroom;Two people to help with walking and/or transfers;Direct  supervision/assist for medications management;Direct supervision/assist for financial management;Assist for transportation;Assistance with cooking/housework    Functional Status Assessment  Patient has had a recent decline in their functional status and demonstrates the ability to make significant improvements in function in a reasonable and predictable amount of time.  Equipment Recommendations  None recommended by OT    Recommendations for Other Services       Precautions / Restrictions Precautions Precautions: Fall Restrictions Weight Bearing Restrictions: No      Mobility Bed Mobility Overal bed mobility: Needs Assistance Bed Mobility: Rolling, Sidelying to Sit, Sit to Sidelying Rolling: Total assist Sidelying to sit: Total assist     Sit to sidelying: Total assist General bed mobility comments: Total A for managing trunk and BLEs    Transfers                   General transfer comment: Defer      Balance Overall balance assessment: Needs assistance Sitting-balance support: No upper extremity supported, Feet supported Sitting balance-Leahy Scale: Poor                                     ADL either performed or assessed with clinical judgement   ADL Overall ADL's : Needs assistance/impaired                                       General ADL Comments: Total A for ADLs and bed mobility     Vision Baseline Vision/History: 1 Wears glasses Additional Comments: L visual field cut vs neglect vs both?     Perception  Praxis      Pertinent Vitals/Pain Pain Assessment Pain Assessment: Faces Faces Pain Scale: No hurt Pain Descriptors / Indicators: Discomfort, Grimacing Pain Intervention(s): Monitored during session, Limited activity within patient's tolerance, Repositioned     Hand Dominance Right   Extremity/Trunk Assessment Upper Extremity Assessment Upper Extremity Assessment: RUE deficits/detail;LUE  deficits/detail RUE Deficits / Details: Pushing with RUE. increased edema. Able to bring to face with MIn A for support to wash face RUE Coordination: decreased fine motor;decreased gross motor LUE Deficits / Details: No active movement. edema. LUE Coordination: decreased gross motor;decreased fine motor   Lower Extremity Assessment Lower Extremity Assessment: Defer to PT evaluation   Cervical / Trunk Assessment Cervical / Trunk Assessment: Kyphotic   Communication Communication Communication: HOH   Cognition Arousal/Alertness: Awake/alert Behavior During Therapy: Impulsive, Restless Overall Cognitive Status: Impaired/Different from baseline Area of Impairment: Attention, Memory, Following commands, Safety/judgement, Awareness, Problem solving                   Current Attention Level: Focused, Sustained Memory: Decreased short-term memory Following Commands: Follows one step commands inconsistently, Follows one step commands with increased time Safety/Judgement: Decreased awareness of safety, Decreased awareness of deficits Awareness: Intellectual Problem Solving: Slow processing, Decreased initiation, Difficulty sequencing, Requires verbal cues General Comments: Pt verbalizing two statement "water" and "im ok". able to sustain attention for washing face but overall very short attention. Requiring increased cues throughout.     General Comments  VSS on 2L    Exercises     Shoulder Instructions      Home Living Family/patient expects to be discharged to:: Private residence Living Arrangements: Alone Available Help at Discharge: Family;Available 24 hours/day Type of Home: Apartment Home Access: Level entry;Stairs to enter Entrance Stairs-Number of Steps: 1 Entrance Stairs-Rails: None Home Layout: One level     Bathroom Shower/Tub: Teacher, early years/pre: Standard     Home Equipment: Conservation officer, nature (2 wheels);Cane - single point;Toilet  riser;BSC/3in1   Additional Comments: Planning for dc to daughter's home ([place])      Prior Functioning/Environment Prior Level of Function : Independent/Modified Independent             Mobility Comments: Requiring assistance for practicing transfers ADLs Comments: Requiring assistance for ADLs at rehab        OT Problem List: Decreased strength;Decreased range of motion;Decreased activity tolerance;Impaired balance (sitting and/or standing);Decreased knowledge of use of DME or AE;Decreased knowledge of precautions      OT Treatment/Interventions: Self-care/ADL training;Therapeutic exercise;Energy conservation;DME and/or AE instruction;Therapeutic activities;Patient/family education    OT Goals(Current goals can be found in the care plan section) Acute Rehab OT Goals Patient Stated Goal: Unstated OT Goal Formulation: Patient unable to participate in goal setting Time For Goal Achievement: 02/04/22 Potential to Achieve Goals: Good  OT Frequency: Min 2X/week    Co-evaluation              AM-PAC OT "6 Clicks" Daily Activity     Outcome Measure Help from another person eating meals?: Total Help from another person taking care of personal grooming?: Total Help from another person toileting, which includes using toliet, bedpan, or urinal?: Total Help from another person bathing (including washing, rinsing, drying)?: Total Help from another person to put on and taking off regular upper body clothing?: Total Help from another person to put on and taking off regular lower body clothing?: Total 6 Click Score: 6   End of Session Equipment Utilized During Treatment: Oxygen Nurse  Communication: Mobility status  Activity Tolerance: Patient limited by fatigue Patient left: in bed;with call bell/phone within reach;with chair alarm set;with restraints reapplied  OT Visit Diagnosis: Unsteadiness on feet (R26.81);Other abnormalities of gait and mobility (R26.89);Muscle weakness  (generalized) (M62.81);Hemiplegia and hemiparesis Hemiplegia - Right/Left: Left Hemiplegia - caused by: Cerebral infarction                Time: 0981-1914 OT Time Calculation (min): 37 min Charges:  OT General Charges $OT Visit: 1 Visit OT Evaluation $OT Eval Moderate Complexity: 1 Mod  Joslyne Marshburn MSOT, OTR/L Acute Rehab Office: [place] 01/21/2022, 3:51 PM

## 2022-01-21 NOTE — Progress Notes (Signed)
NAME:  Chad [name], MRN:  [id number], DOB:  12/11/1931, LOS: 6 ADMISSION DATE:  01/28/2022, CONSULTATION DATE:  6/14 REFERRING MD:  Sloan Leiter, CHIEF COMPLAINT:  Dyspnea after aspiration   History of Present Illness:  88 y/o male admitted for stroke on 5/28, had angioplasty of R ICA, then moved to rehab on 6/1.  At rehab had recurrent aspiration.  Admitted to ICU on 6/14 in setting of aspiration of tube feeds leading to acute respiratory failure with hypoxemia.    Pertinent  Medical History  Arthritis Heart murmur Hypertension Stroke  Significant Hospital Events: Including procedures, antibiotic start and stop dates in addition to other pertinent events   6/14 Admit from CIR, intubated upon arrival to ICU, concern for GI bleed, shock 6/15 convulsive movements of R arm, started LTM EEG, head CT findings worrisome for new stroke 6/16 EGD >> gastric polyps noted, erythema of gastric antrum, no ulcer, food in esophagus and stomach 6/17 palliative care and pulmonary spoke to patient's family>  6/18 MRI Brain >> acute/subacute non-hemorrhagic infarct involving the right frontal operculum, right middle frontal gyrus, left frontal lobe and right occipital lobe, remote lacunar infarcts of the basal ganglia & thalami bilaterally & in cerebellum, atrophy and white matter disease moderately advanced for age 47/18 MRA Brain >> signal loss at the site of the previous right superior division M2 occlusion, flow is distal to signal loss.  This likely represents a residual high grade stenosis, otherwise normal MRA circle of Willis 6/19 Reports of vomiting yesterday, none overnight. TF restarted 0400.  Self extubated 6/19.  GOC discussed, no plans for reintubation, DNR, comfort care if decompensates  Interim History / Subjective:   Self extubated 6/19, 2 L/min Precedex 0.4 GOC discussion noted, no plans for reintubation, DNR I/O- 5 L total, urine output 2 L / 24 hours Serum creatinine 3.06 >  2.91 Sodium 150, potassium 3.2  Objective   Blood pressure (!) 169/83, pulse 85, temperature 98.1 F (36.7 C), temperature source Oral, resp. rate (!) 23, weight 62 kg, SpO2 100 %.    Vent Mode: PSV;CPAP FiO2 (%):  [30 %-100 %] 100 % Set Rate:  [16 bmp] 16 bmp Vt Set:  [420 mL] 420 mL PEEP:  [5 cmH20] 5 cmH20 Pressure Support:  [5 cmH20-10 cmH20] 5 cmH20 Plateau Pressure:  [15 cmH20] 15 cmH20   Intake/Output Summary (Last 24 hours) at 01/21/2022 0727 Last data filed at 01/21/2022 0400 Gross per 24 hour  Intake 935.68 ml  Output 2175 ml  Net -1239.32 ml   Filed Weights   01/18/22 0500 01/19/22 0408 01/21/22 0500  Weight: 66.8 kg 68.8 kg 62 kg    Examination: General: Critically ill man, cachectic, no apparent distress HEENT: Arcus senilis, oropharynx dry, NG tube in place Neuro: Opens eyes, tracks, attempted to open mouth to command.  Spontaneously moves on the right, not on the left.  Did not answer questions or interact.  Precedex 0.4 CV: s1s2 RRR, no m/r/g PULM: Clear bilaterally GI: Nondistended, positive bowel sounds Extremities: Mild upper extremity edema, right greater than left, none in the lower extremities Skin: No rash   Resolved Hospital Problem list   Aspiration pneumonia  Assessment & Plan:   Acute respiratory failure with hypoxemia due to aspiration pneumonia, NOS Aspiration PNA  -Self extubated 6/19, tolerating, protecting airway, adequate oxygenation -Continue to push pulmonary hygiene -Unasyn completed 6/18 -Follow intermittent chest x-ray  AKI on CKD IIIa due to ATN Hyperkalemia, Hypernatremia  Anasarca 6/17 Bladder Outlet Obstruction -  Lasix held on 6/19, continue to hold on 6/28 -Increase free water on 6/20 -Follow urine output, BMP -Replace potassium -Ensure adequate renal perfusion, avoid nephrotoxins  GI Bleed  Large melanotic stool on 6/14), no clear etiology defined by EGD on 6/16; no ongoing bleeding 6/17 after starting ASA,  brillinta for new stroke.  Required transfusion 1 unit PRBC 6/19 -Remains on Brilinta, ASA 81, prophylactic heparin -Continue to follow CBC, transfusion goal hemoglobin 7.0 -PPI as ordered -Follow-up for any evidence of active blood loss  Acute CVA with left hemiplegia, new stroke on R noted on CT head 6/15 Convulsions 6/15, no further activity noted, LTM EEG negative for seizure Carotid Stenosis  -Appreciate neurology management, following neuro exam -Continue aspirin, Brilinta, statin -Careful with sedating medications  Chronic HFpEF -Lasix currently on hold, dosing daily depending on renal function, volume status -Following strict I's/O -Blood pressure control  DM2 Glucose range 102-113 -Continue sliding scale insulin and CBG monitoring as per protocol  Vomiting  Dysphagia  KUB unremarkable 6/18  -SLP evaluation when he is able to participate -TF and free water for now via NG tube  Goals of care 6/17 Update: Multiple daughters were updated at bedside.  A daughter from [place] arrived and had questions which were answered.  In general the family seemed focused on his comfort and quality of life and were not interested in doing procedures that may cause increased pain or suffering with little change in his long term prognosis. 6/19 IPAL Mtg by Dr [place] Bells > established DNR status, full medical care but would not want MV, CPR, HD if declines  Best Practice (right click and "Reselect all SmartList Selections" daily)  Diet/type: NPO DVT prophylaxis: prophylactic heparin  GI prophylaxis: PPI Lines: N/A Foley:  Yes, and it is still needed Code Status:  DNR Last date of multidisciplinary goals of care discussion: IPAL on 6/19  Critical care time: 31 minutes     Baltazar Apo, MD, PhD 01/21/2022, 7:39 AM [facility] [phone] or if no answer before 7:00PM call [phone] For any issues after 7:00PM please call eLink [phone]

## 2022-01-21 NOTE — Progress Notes (Signed)
RT NT suctioned pt x2 with a moderate amount of clear, thick secretions. Vitals stable throughout.

## 2022-01-21 NOTE — Progress Notes (Signed)
STROKE TEAM PROGRESS NOTE   SUBJECTIVE (INTERVAL HISTORY) Patient self extubated himself and seems to be breathing all right.  He is drowsy but can be aroused.  Speech is dysarthric.  He follows some simple commands.  He remains plegic on the left but is able to move right side purposefully against gravity.  Vital signs stable.  Family meeting yesterday with critical care team about goals of care and family decided on DNR but want to pursue aggressive medical care for now OBJECTIVE Temp:  [98.1 F (36.7 C)-99.8 F (37.7 C)] 98.7 F (37.1 C) (06/20 1200) Pulse Rate:  [54-87] 74 (06/20 0600) Cardiac Rhythm: Normal sinus rhythm (06/20 0800) Resp:  [15-27] 15 (06/20 0600) BP: (116-171)/(47-83) 161/76 (06/20 1326) SpO2:  [100 %] 100 % (06/20 0600) FiO2 (%):  [30 %-100 %] 100 % (06/19 1655) Weight:  [62 kg] 62 kg (06/20 0500)  Recent Labs  Lab 01/20/22 1954 01/20/22 2354 01/21/22 0336 01/21/22 0716 01/21/22 1155  GLUCAP 103* 113* 109* 102* 108*   Recent Labs  Lab 01/07/2022 0339 01/12/2022 1623 01/18/22 0429 01/18/22 1646 01/19/22 0558 01/19/22 1750 01/20/22 0154 01/21/22 0251  NA 137  --  139  --  145  --  150* 150*  K 5.4*  --  3.7  --  3.6  --  4.0 3.2*  CL 103  --  104  --  107  --  109 109  CO2 21*  --  21*  --  23  --  25 27  GLUCOSE 121*  --  153*  --  98  --  121* 112*  BUN 57*  --  57*  --  56*  --  61* 58*  CREATININE 3.01*  --  3.08*  --  2.95*  --  3.06* 2.91*  CALCIUM 8.0*  --  7.7*  --  7.9*  --  7.7* 8.0*  MG 1.7   < > 2.3 2.1 2.1 2.0 2.0  --   PHOS 5.3*   < > 5.0* 4.5 4.8* 4.4 4.3  --    < > = values in this interval not displayed.   Recent Labs  Lab 01/14/2022 1118 01/16/22 0300 01/28/2022 0339 01/18/22 0429 01/21/22 0251  AST 25 22 27 27 23   ALT 14 13 15 16 15   ALKPHOS 53 53 66 86 63  BILITOT 0.6 0.5 0.7 0.6 1.0  PROT 5.9* 5.4* 5.7* 6.1* 6.6  ALBUMIN 2.1* 1.8* 2.0* 2.0* 2.1*   Recent Labs  Lab 01/14/2022 0703 01/16/2022 1118 01/16/22 0300  01/14/2022 0339 01/18/22 0429 01/19/22 0558 01/21/22 0251  WBC 14.2*   < > 18.4* 13.5* 8.9 10.5 6.3  NEUTROABS 9.9*  --  11.1*  --   --   --   --   HGB 8.0*   < > 9.7* 9.5* 8.7* 9.5* 8.4*  HCT 24.4*   < > 27.5* 28.6* 26.0* 28.5* 25.8*  MCV 84.1   < > 88.7 89.1 87.2 86.9 89.9  PLT 298   < > 212 250 214 248 227   < > = values in this interval not displayed.   No results for input(s): "CKTOTAL", "CKMB", "CKMBINDEX", "TROPONINI" in the last 168 hours. No results for input(s): "LABPROT", "INR" in the last 72 hours. No results for input(s): "COLORURINE", "LABSPEC", "PHURINE", "GLUCOSEU", "HGBUR", "BILIRUBINUR", "KETONESUR", "PROTEINUR", "UROBILINOGEN", "NITRITE", "LEUKOCYTESUR" in the last 72 hours.  Invalid input(s): "APPERANCEUR"      Component Value Date/Time   CHOL 149 12/30/2021 0412  TRIG 68 12/30/2021 0412   HDL 45 12/30/2021 0412   CHOLHDL 3.3 12/30/2021 0412   VLDL 14 12/30/2021 0412   LDLCALC 90 12/30/2021 0412   Lab Results  Component Value Date   HGBA1C 6.1 (H) 12/29/2021   No results found for: "LABOPIA", "COCAINSCRNUR", "LABBENZ", "AMPHETMU", "THCU", "LABBARB"  No results for input(s): "ETH" in the last 168 hours.  I have personally reviewed the radiological images below and agree with the radiology interpretations.  VAS US CAROTID  Result Date: 01/20/2022 Carotid Arterial Duplex Study Patient Name:  CAMAURI CRATON  Date of Exam:   01/19/2022 Medical Rec #: [id number]          Accession #:    [id number] Date of Birth: October 19, 1931           Patient Gender: M Patient Age:   88 years Exam Location:  [facility] Procedure:      VAS US CAROTID Referring Phys: Cornelius Moras XU --------------------------------------------------------------------------------  Indications:       CVA. Risk Factors:      Hypertension, hyperlipidemia. Limitations        Today's exam was limited due to the patient's inability or                    unwillingness to cooperate, the patient's respiratory                     variation, patient on a ventilator and patient movement,                    patient positioning. Comparison Study:  No prior studies. Performing Technologist: Oliver Hum RVT  Examination Guidelines: A complete evaluation includes B-mode imaging, spectral Doppler, color Doppler, and power Doppler as needed of all accessible portions of each vessel. Bilateral testing is considered an integral part of a complete examination. Limited examinations for reoccurring indications may be performed as noted.  Right Carotid Findings: +----------+--------+--------+--------+--------------------------+--------+           PSV cm/sEDV cm/sStenosisPlaque Description        Comments +----------+--------+--------+--------+--------------------------+--------+ CCA Prox  53      9               irregular and heterogenous         +----------+--------+--------+--------+--------------------------+--------+ CCA Distal61      8               irregular and heterogenous         +----------+--------+--------+--------+--------------------------+--------+ ICA Prox  77      9               calcific and heterogenous          +----------+--------+--------+--------+--------------------------+--------+ ICA Distal57      12                                        tortuous +----------+--------+--------+--------+--------------------------+--------+ ECA       83      7                                                  +----------+--------+--------+--------+--------------------------+--------+ +----------+--------+-------+--------+-------------------+           PSV cm/sEDV cmsDescribeArm Pressure (mmHG) +----------+--------+-------+--------+-------------------+ [id number]                                         +----------+--------+-------+--------+-------------------+ +---------+--------+--+--------+-+----------------------------+  VertebralPSV cm/s67EDV cm/s5Antegrade and  High resistant +---------+--------+--+--------+-+----------------------------+  Left Carotid Findings: Unable to obtain diagnostic images of the left carotid system due to patient movement, and poor positioning.  Summary: Right Carotid: Velocities in the right ICA are consistent with a 1-39% stenosis. Vertebrals: Right vertebral artery demonstrates high resistant flow. *See table(s) above for measurements and observations.  Electronically signed by Jamelle Haring on 01/20/2022 at 11:43:21 AM.    Final    MR ANGIO HEAD WO CONTRAST  Result Date: 01/19/2022 CLINICAL DATA:  Stroke, follow-up. Right MCA infarct. Additional punctate infarcts involving the right occipital lobe and left frontal lobe. EXAM: MRA HEAD WITHOUT CONTRAST TECHNIQUE: Angiographic images of the Circle of Willis were acquired using MRA technique without intravenous contrast. COMPARISON:  MRA head 12/30/2021.  CTA head 12/30/2021 FINDINGS: Anterior circulation: Focal signal loss is present at the site of the previous right superior division M2 occlusion. Flow signal is now present distal to this region. The inferior M2 segment is within normal limits. Left MCA bifurcations are normal. Mild irregularity is present the cavernous internal carotid artery without a significant ICA stenosis. A1 and M1 segments are normal. The MCA bifurcations are intact. ACA branch vessels are normal. Posterior circulation: Left vertebral artery is the dominant vessel. The right vertebral artery is hypoplastic common bifurcating at the PICA. The vertebrobasilar junction and basilar artery are normal. Both posterior cerebral arteries originate from basilar tip. Left posterior communicating artery is patent. Anatomic variants: None Other: None. IMPRESSION: 1. Signal loss at the site of the previous right superior division M2 occlusion. Flow is present distal to signal loss. This likely represents a residual high-grade stenosis. 2. Otherwise normal MRA circle-of-Willis.  Electronically Signed   By: San Morelle M.D.   On: 01/19/2022 18:55   MR BRAIN WO CONTRAST  Result Date: 01/19/2022 CLINICAL DATA:  Mental status change.  Stroke, follow-up. EXAM: MRI HEAD WITHOUT CONTRAST TECHNIQUE: Multiplanar, multiecho pulse sequences of the brain and surrounding structures were obtained without intravenous contrast. COMPARISON:  CT head without contrast 01/16/2022. MR head without contrast 12/30/2021. FINDINGS: Brain: Restricted diffusion is present in the right frontal operculum consistent with acute/subacute infarct. This extends superiorly the perirolandic cortex on the right. Involvement of the right middle frontal gyrus also noted. A punctate nonhemorrhagic infarct is present in the left frontal lobe punctate cortical infarct is present in the right occipital lobe. The infarct involves the superior aspect of the right insula. T2 and FLAIR hyperintensities are associated with the areas of acute infarction. Confluent periventricular and subcortical T2 hyperintensities are present otherwise. Remote lacunar infarcts are present the basal ganglia bilaterally. Remote lacunar infarcts are present in the thalami. White matter changes extend into the brainstem. Remote lacunar infarcts of the cerebellum are stable. The ventricles are proportionate to the degree of atrophy. No significant extraaxial fluid collection is present. The internal auditory canals are within normal limits. Vascular: Flow is present in the major intracranial arteries. Skull and upper cervical spine: Degenerative changes are present in the upper cervical spine with narrowing at C3-4. The craniocervical junction is normal. Marrow signal is normal. Sinuses/Orbits: Bilateral mastoid effusions are present. Fluid is present the nasopharynx. This is sec scratched at this is likely secondary to intubation. The paranasal sinuses and mastoid air cells are otherwise clear. Bilateral lens replacements are noted. Globes and  orbits are otherwise unremarkable. IMPRESSION: 1. Acute/subacute nonhemorrhagic infarct involving the right frontal operculum, right middle frontal gyrus, left frontal lobe and right occipital lobe. 2. Remote lacunar  infarcts of the basal ganglia and thalami bilaterally. 3. Remote lacunar infarcts in the cerebellum. 4. Atrophy and white matter disease is moderately advanced for age. This likely reflects the sequela of chronic microvascular ischemia. Electronically Signed   By: San Morelle M.D.   On: 01/19/2022 18:49   DG Abd Portable 1V  Result Date: 01/19/2022 CLINICAL DATA:  Nausea and vomiting. EXAM: PORTABLE ABDOMEN - 1 VIEW COMPARISON:  None Available. FINDINGS: Small bore feeding tube terminates in the distal stomach. Bowel gas pattern is unremarkable. IMPRESSION: Small bore feeding tube terminates in the distal stomach. Electronically Signed   By: San Morelle M.D.   On: 01/19/2022 14:53   DG Abd Portable 1V  Result Date: 01/05/2022 CLINICAL DATA:  Feeding tube EXAM: PORTABLE ABDOMEN - 1 VIEW COMPARISON:  None Available. FINDINGS: Enteric tube is present with tip overlying the distal stomach in the pyloric region. There is some contrast within the fundus of the stomach and colon. IMPRESSION: Enteric tube tip overlies the distal stomach. Electronically Signed   By: Macy Mis M.D.   On: 01/20/2022 10:51   Overnight EEG with video  Result Date: 01/22/2022 Lora Havens, MD     01/18/2022  1:32 PM Patient Name: JOY HAEGELE MRN: [id number] Epilepsy Attending: Lora Havens Referring Physician/Provider: Gwinda Maine, MD Duration: 01/16/2022 1240 to 01/14/2022  1240  Patient history: 88 year old male with history of stroke had an episode of posturing right arm and left gaze.  EEG to evaluate for seizure.  Level of alertness:  lethargic  AEDs during EEG study: None  Technical aspects: This EEG study was done with scalp electrodes positioned according to the 10-20  International system of electrode placement. Electrical activity was acquired at a sampling rate of 500Hz  and reviewed with a high frequency filter of 70Hz  and a low frequency filter of 1Hz . EEG data were recorded continuously and digitally stored.  Description: EEG showed continuous generalized and and lateralized right hemisphere 3- 6 Hz theta-delta slowing. Hyperventilation and photic stimulation were not performed.    ABNORMALITY - Continuous slow, generalized and lateralized right hemisphere  IMPRESSION: This study is suggestive of cortical dysfunction arising from right hemisphere which is likely secondary to underlying strokes. Additionally there is moderate to severe diffuse encephalopathy, nonspecific etiology. No seizures or epileptiform discharges were seen throughout the recording.  Lora Havens    EEG adult  Result Date: 01/16/2022 Lora Havens, MD     01/16/2022 12:45 PM Patient Name: SELSO MANNOR MRN: [id number] Epilepsy Attending: Lora Havens Referring Physician/Provider: Corey Harold, NP Date: 01/16/2022 Duration: 21.43 mins Patient history: 88 year old male with history of stroke had an episode of posturing right arm and left gaze.  EEG to evaluate for seizure. Level of alertness:  lethargic AEDs during EEG study: Ativan Technical aspects: This EEG study was done with scalp electrodes positioned according to the 10-20 International system of electrode placement. Electrical activity was acquired at a sampling rate of 500Hz  and reviewed with a high frequency filter of 70Hz  and a low frequency filter of 1Hz . EEG data were recorded continuously and digitally stored. Description: EEG showed continuous generalized and maximal left frontal 3- 6 Hz theta-delta slowing which at times appears sharply contoured. Hyperventilation and photic stimulation were not performed.   ABNORMALITY - Continuous slow, generalized IMPRESSION: This study is suggestive of moderate to severe diffuse  encephalopathy, nonspecific etiology. No seizures or epileptiform discharges were seen throughout the recording. Priyanka Barbra Sarks  CT HEAD WO CONTRAST (5MM)  Result Date: 01/16/2022 CLINICAL DATA:  Mental status change, unknown cause. EXAM: CT HEAD WITHOUT CONTRAST TECHNIQUE: Contiguous axial images were obtained from the base of the skull through the vertex without intravenous contrast. RADIATION DOSE REDUCTION: This exam was performed according to the departmental dose-optimization program which includes automated exposure control, adjustment of the mA and/or kV according to patient size and/or use of iterative reconstruction technique. COMPARISON:  CT angiogram head/neck 12/30/2021. Brain MRI 12/30/2021. FINDINGS: Brain: Moderate cerebral atrophy. New from the prior head CT of 12/30/2021, there is a small to moderate-sized acute/subacute cortical and subcortical infarct within the right frontoparietal lobes and right insula (right MCA vascular territory). Additional known small subacute infarcts within the right MCA vascular territory were better appreciated on the prior brain MRI of 12/30/2021 (acute at that time). Background advanced patchy and ill-defined hypoattenuation within the cerebral white matter, nonspecific but compatible chronic small vessel ischemic disease. Redemonstrated chronic lacunar infarcts within the left basal ganglia and thalamus. There is no acute intracranial hemorrhage. No extra-axial fluid collection. No evidence of an intracranial mass. No midline shift. Vascular: Atherosclerotic calcifications. Redemonstrated focus of calcification along an M2 right middle cerebral artery, which may reflect a calcified embolus. Skull: No fracture or aggressive osseous lesion. Sinuses/Orbits: No mass or acute finding within the imaged orbits. Trace mucosal thickening within the bilateral ethmoid sinuses. IMPRESSION: New from the prior head CT of 12/30/2021, there is a small to moderate-sized  acute/subacute cortical and subcortical infarct within the right frontoparietal lobes and right insula (right MCA vascular territory). Additional known small subacute infarcts within the right MCA vascular territory were better appreciated on the prior brain MRI of 12/30/2021 (acute at that time). Background advanced chronic small vessel ischemic changes within the cerebral white matter. Redemonstrated chronic lacunar infarcts within the left basal ganglia and left thalamus. Moderate generalized cerebral atrophy. Electronically Signed   By: Kellie Simmering D.O.   On: 01/16/2022 11:28   US RENAL  Result Date: 01/26/2022 CLINICAL DATA:  Renal dysfunction EXAM: RENAL / URINARY TRACT ULTRASOUND COMPLETE COMPARISON:  None Available. FINDINGS: Right Kidney: Renal measurements: 8.9 x 5 x 5 cm = volume: 115.2 mL. There is no hydronephrosis. There is increased cortical echogenicity. Left Kidney: Renal measurements: 10.3 x 6.2 x 5 cm = volume: 165.9 mL. There is no hydronephrosis. There is increased cortical echogenicity. Bladder: Foley catheter is seen in the bladder. Bladder is not adequately distended for evaluation. Other: 1.1 cm splenule is noted adjacent to the inferior margin of spleen. Left pleural effusion is seen. IMPRESSION: There is no hydronephrosis. Increased cortical echogenicity suggests medical renal disease. Left pleural effusion. Electronically Signed   By: Elmer Picker M.D.   On: 01/23/2022 12:21   Portable Chest x-ray  Result Date: 01/27/2022 CLINICAL DATA:  Intubation EXAM: PORTABLE CHEST 1 VIEW COMPARISON:  01/10/2022, 3:13 a.m. FINDINGS: Interval endotracheal intubation, tip approximately 2 cm above the carina. Enteric feeding tube remains in position, partially imaged. Cardiomegaly. Diffuse bilateral heterogeneous and interstitial airspace opacity, unchanged. IMPRESSION: 1. Interval endotracheal intubation, tip approximately 2 cm above the carina. Enteric feeding tube remains in position,  partially imaged. 2. Cardiomegaly. Diffuse bilateral heterogeneous and interstitial airspace opacity, unchanged. Electronically Signed   By: Delanna Ahmadi M.D.   On: 01/24/2022 10:39   DG Abd 1 View  Result Date: 01/03/2022 CLINICAL DATA:  88 year old male with abdominal pain and vomiting. EXAM: ABDOMEN - 1 VIEW COMPARISON:  01/10/2022. FINDINGS: Portable AP supine views  at 0704 hours. Patient is mildly rotated to the left but the enteric tube tip is now in the gastric body. Extensive Calcified aortic atherosclerosis. Pelvic vascular calcifications. Progressive opacification of the visible left lung base, left hemidiaphragm now largely obscured. Decreased large bowel gas and borderline to mildly dilated gas-filled mid abdominal small bowel loops now. Spinal scoliosis and degeneration. No acute osseous abnormality identified. IMPRESSION: 1. Enteric tube tip now in the gastric body. 2. Decreased large bowel gas and borderline to mildly dilated mid abdominal small bowel loops. This could reflect ileus or developing small bowel obstruction. Follow-up abdominal radiographs may be valuable. 3. Progressive opacification at the left lung base since 01/10/2022, see portable chest x-ray 0313 hours today. Electronically Signed   By: Genevie Ann M.D.   On: 01/12/2022 07:37   DG Chest Port 1 View  Result Date: 01/06/2022 CLINICAL DATA:  Acute respiratory distress EXAM: PORTABLE CHEST 1 VIEW COMPARISON:  03/23/2022 FINDINGS: Cardiomegaly. Vascular congestion. Aortic atherosclerosis. Perihilar airspace opacities bilaterally. No effusions. No acute bony abnormality. IMPRESSION: Perihilar airspace opacities, likely edema although pneumonia not excluded. Electronically Signed   By: Rolm Baptise M.D.   On: 01/29/2022 03:29   DG Swallowing Func-Speech Pathology  Result Date: 01/10/2022 Table formatting from the original result was not included. Objective Swallowing Evaluation: Type of Study: MBS-Modified Barium Swallow Study   Patient Details Name: JAVONTAY VANDAM MRN: [id number] Date of Birth: 1932-04-10 Today's Date: 01/10/2022 Time: SLP Start Time (ACUTE ONLY): 1100 -SLP Stop Time (ACUTE ONLY): 1123 SLP Time Calculation (min) (ACUTE ONLY): 23 min Past Medical History: Past Medical History: Diagnosis Date  Arthritis   Heart murmur   asa child   Hypertension   Stroke [facility])  Past Surgical History: Past Surgical History: Procedure Laterality Date  EYE SURGERY Bilateral 2022  IR ANGIO INTRA EXTRACRAN SEL COM CAROTID INNOMINATE UNI R MOD SED  01/01/2022  IR CT HEAD LTD  01/01/2022  IR PTA INTRACRANIAL  01/01/2022  JOINT REPLACEMENT    RADIOLOGY WITH ANESTHESIA N/A 01/01/2022  Procedure: Angiogram;  Surgeon: Luanne Bras, MD;  Location: [place];  Service: Radiology;  Laterality: N/A;  TONSILLECTOMY    TOTAL KNEE ARTHROPLASTY Right 03/30/2013  Procedure: RIGHT TOTAL KNEE ARTHROPLASTY;  Surgeon: Tobi Bastos, MD;  Location: [facility];  Service: Orthopedics;  Laterality: Right;  TOTAL KNEE ARTHROPLASTY Left 10/09/2020  Procedure: TOTAL KNEE ARTHROPLASTY;  Surgeon: Paralee Cancel, MD;  Location: [facility];  Service: Orthopedics;  Laterality: Left;  70 mins HPI: 88 yo male presenting to the ED on 5/28 with L sided weakness, facial droop, slurred speech, and fall. 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 PMH including hypothyroidism chronic pain syndromes, CKD, HTN, arthritis, bil TKA.  Subjective: alert, friendly  Recommendations for follow up therapy are one component of a multi-disciplinary discharge planning process, led by the attending physician.  Recommendations may be updated based on patient status, additional functional criteria and insurance authorization. Assessment / Plan / Recommendation   01/10/2022   8:50 PM Clinical Impressions Clinical Impressions SLP Visit Diagnosis MBSS completed secondary to clinical concerns for aspiration of current diet textures (puree and nectar-thick  liquids). At this time, pt presents with severe oropharyngeal dysphagia, with significant UES dysfunction, which resulted in significant posterior tracheal sensed aspiration (PAS 7) of pureed texture via interarytenoid space due to overflow of pyriform sinus residuals into laryngeal vestibule. Unfortunately, pharyngeal stasis and pyriform sinus residuals could not clear due to poor  UES relaxation with mild esophageal backflow to pyriform sinuses. Additionally, delayed sensation of gross aspiration of nectar-thick liquid via tsp was appreciated during the swallow. [name] cough response was not effective in pulmonary clearance. Pt required tactile, hyoglossal assistance and verbal cues, from SLP, to initiate swallow response during puree trials. It should be noted that this is a significant change from pt's MBSS results documented on 01/02/2022.   Given pt's risk severity for development of aspiration PNA (Total A for oral care, decreased ambulation, deconditioning) and change in swallow function, recommend initiation of NPO with temporary alternate means of nutrition, hydration, and medications at this time. May consider GI referral to assess UES function if improvement is not noted with therapy alone. [name] daughter present and results + recommendations were reviewed with pt an daughter who verbalized agreement with recommendations following education. MD, LPN, and PA notified of recommendations. Please see imaging report for full details. Dysphagia, oropharyngeal phase (R13.12);Cognitive communication deficit (R41.841) Impact on safety and function Severe aspiration risk;Risk for inadequate nutrition/hydration     01/02/2022  11:00 AM Treatment Recommendations Treatment Recommendations Therapy as outlined in treatment plan below     01/10/2022   1:59 PM Prognosis Prognosis for Safe Diet Advancement Good Barriers to Reach Goals Cognitive deficits;Severity of deficits   01/10/2022   8:50 PM Diet Recommendations SLP Diet  Recommendations NPO;Alternative means - temporary Medication Administration Via alternative means Postural Changes Other (Comment)     01/10/2022   8:50 PM Other Recommendations Recommended Consults Consider GI evaluation;Consider esophageal assessment Oral Care Recommendations Oral care QID Other Recommendations Have oral suction available   01/10/2022   8:50 PM Frequency and Duration  Treatment Duration 2 weeks     01/10/2022   1:53 PM Oral Phase Oral Phase Impaired Oral - Nectar Teaspoon Weak lingual manipulation;Lingual pumping;Delayed oral transit;Decreased bolus cohesion;Premature spillage;Lingual/palatal residue;Piecemeal swallowing;Reduced posterior propulsion Oral - Nectar Cup NT Oral - Nectar Straw NT Oral - Thin Cup NT Oral - Thin Straw NT Oral - Puree Weak lingual manipulation;Lingual pumping;Incomplete tongue to palate contact;Lingual/palatal residue;Delayed oral transit;Decreased bolus cohesion;Premature spillage;Holding of bolus;Piecemeal swallowing Oral - Mech Soft NT Oral - Regular NT Oral - Multi-Consistency NT Oral - Pill NT    01/10/2022   1:54 PM Pharyngeal Phase Pharyngeal Phase Impaired Pharyngeal- Nectar Teaspoon Delayed swallow initiation-pyriform sinuses;Reduced pharyngeal peristalsis;Reduced airway/laryngeal closure;Moderate aspiration;Inter-arytenoid space residue;Pharyngeal residue - cp segment;Pharyngeal residue - posterior pharnyx;Pharyngeal residue - pyriform;Pharyngeal residue - valleculae;Lateral channel residue;Penetration/Aspiration during swallow;Reduced laryngeal elevation Pharyngeal Material enters airway, passes BELOW cords and not ejected out despite cough attempt by patient Pharyngeal- Nectar Cup NT Pharyngeal- Nectar Straw NT Pharyngeal- Thin Cup NT Pharyngeal- Thin Straw NT Pharyngeal- Puree Delayed swallow initiation-pyriform sinuses;Reduced pharyngeal peristalsis;Reduced anterior laryngeal mobility;Reduced tongue base retraction;Penetration/Apiration after swallow;Moderate  aspiration;Pharyngeal residue - pyriform;Pharyngeal residue - posterior pharnyx;Pharyngeal residue - cp segment;Inter-arytenoid space residue;Lateral channel residue;Compensatory strategies attempted (with notebox) Pharyngeal Material enters airway, passes BELOW cords and not ejected out despite cough attempt by patient Pharyngeal- Mechanical Soft NT Pharyngeal- Regular NT Pharyngeal- Multi-consistency NT Pharyngeal- Pill NT    01/10/2022   1:58 PM Cervical Esophageal Phase  Cervical Esophageal Phase Impaired Nectar Teaspoon Reduced cricopharyngeal relaxation Nectar Cup NT Nectar Straw NT Puree Esophageal backflow into the pharynx;Reduced cricopharyngeal relaxation Mechanical Soft NT Regular NT Multi-consistency NT Pill NT Cervical Esophageal Comment Decreased relaxation of the UES observed with significant pyriform sinus residuals Bethany A Lutes 01/10/2022, 9:06 PM  DG Abd Portable 1V  Result Date: 01/10/2022 CLINICAL DATA:  Feeding tube placement. EXAM: PORTABLE ABDOMEN - 1 VIEW COMPARISON:  None Available. FINDINGS: Feeding tube tip is at the level of the gastric antrum. Small rounded densities are seen in the region of the colon likely related to diverticula. There is a heterogeneous rounded density projecting over the right upper quadrant measuring 3.0 x 3.0 cm. This is indeterminate. Lung bases are clear. There is mild curvature of the lumbar spine with degenerative change. IMPRESSION: 1. Feeding tube tip is at the level of the gastric antrum. 2. Scattered colonic diverticula. 3. 3 cm heterogeneous density in the right upper quadrant, indeterminate. Findings may represent a large gallstone, renal calcification or other calcified mass. Please correlate clinically. This can be further evaluated with CT. Electronically Signed   By: Ronney Asters M.D.   On: 01/10/2022 15:07   IR ANGIO INTRA EXTRACRAN SEL COM CAROTID INNOMINATE UNI R MOD SED  Result Date: 01/03/2022 CLINICAL DATA:  Left-sided  weakness with dysarthria. Patient with calcified embolus in the inferior division right MCA. Discovered to have high-grade stenosis of the extracranial right ICA proximally EXAM: IR ANGIO INTRA EXTRACRAN SEL COM CAROTID INNOMINATE UNI RIGHT MOD SED COMPARISON:  CT angiogram of the head and neck of Dec 30, 2021. MEDICATIONS: Heparin 3,000 units IV. Ancef 2 g IV antibiotic was administered within 1 hour of the procedure. ANESTHESIA/SEDATION: General anesthesia. CONTRAST:  Omnipaque 300 approximately 120 cc. FLUOROSCOPY TIME:  Fluoroscopy Time: 76 minutes 36) seconds (1257 mGy). COMPLICATIONS: None immediate. TECHNIQUE: Informed written consent was obtained from the patient after a thorough discussion of the procedural risks, benefits and alternatives. All questions were addressed. Maximal Sterile Barrier Technique was utilized including caps, mask, sterile gowns, sterile gloves, sterile drape, hand hygiene and skin antiseptic. A timeout was performed prior to the initiation of the procedure. The left groin was prepped and draped in the usual sterile fashion. Thereafter using modified Seldinger technique, transfemoral access into the left common femoral artery was obtained without difficulty. Over a 0.035 inch guidewire, an 8 French 25 cm Pinnacle sheath was inserted. Through this, and also over 0.035 inch guidewire, a 120 cm 6 French Simmons 2 catheter inside of an 087 95 cm balloon guide catheter combination was advanced to the aortic arch region and selectively positioned in the proximal right common carotid artery and the innominate artery. An arteriogram was then performed centered extra cranially and intracranially. FINDINGS: The innominate arteriogram demonstrates significant tortuosity of the innominate artery in its entirety extending into the right common carotid artery with a 180 degree loop in the proximal right common carotid artery. Subclavian artery demonstrates patency with opacification of a  hypoplastic right vertebral artery extra cranially. The right common carotid arteriogram demonstrates complex atherosclerotic calcified plaque at the right common carotid bifurcation extending into the bulb region of the right internal carotid artery resulting in a high-grade approximately 80-90% stenosis. More distally, the right internal carotid artery opacifies to the cranial skull base. Patency is seen of the petrous, cavernous and supraclinoid right ICA with a prominent right posterior communicating artery. Proximal portions of the right middle cerebral artery and the right anterior cerebral artery demonstrate patency. ENDOVASCULAR REVASCULARIZATION OF HIGH-GRADE STENOSIS OF THE RIGHT INTERNAL CAROTID ARTERY PROXIMALLY WITH BALLOON ANGIOPLASTY The balloon guide catheter was advanced into the proximal right common carotid artery. Through this, and also over a 0.014 inch Transend EX soft tip micro guidewire, an 021 150 cm microcatheter was then advanced with  biplane DSA roadmap to the proximal cavernous right ICA followed by the microcatheter. The micro guidewire was removed. Good aspiration obtained from the hub of the microcatheter. A gentle control arteriogram performed through the microcatheter demonstrated safe positioning of the tip of the microcatheter. This in turn was then exchanged out for a 300 cm 014 inch BMW exchange wire with a moderate J configuration. The tip of the micro guidewire was positioned at the petrous cavernous junction. Measurements were then performed of the right internal carotid artery distal to the stenosis, and just proximal to the stenosis of the distal common carotid artery. A 5 mm x 20 mm 014 inch Viatrac balloon guide catheter was then prepped and purged retrogradely with heparinized saline infusion, and retrogradely with 50% contrast and 50% heparinized saline infusion. Using the rapid exchange technique, the balloon catheter was then advanced without difficulty and positioned  with the distal and proximal markers adequate distant from the site of the severe stenosis in the proximal right internal carotid artery. Slow control inflation was then performed using micro inflation syringe device via micro tubing. Inflations were performed slowly to 9 atmospheres where it was maintained for approximately 60 seconds. Thereafter, the balloon was deflated and retrieved and removed. A control arteriogram performed through the balloon guide catheter in the proximal right common carotid artery demonstrated significantly improved caliber and flow through the angioplastied segment. More distally, free flow was noted into the distal right ICA intracranially. Over the exchange BMW micro guidewire, a 8 mm x 26 mm Wallstent stent delivery system which had been prepped with heparinized saline infusion was then advanced again using the rapid exchange technique to the proximal right common carotid artery. Resistance was encountered to further advancement of the stent delivery apparatus. At this time the balloon guide catheter was advanced to the distal right common carotid artery. Further attempts were made to advance the stent delivery apparatus into the right internal carotid proximally. Advancement could only manage just distal to the angioplastied segment with no significant coverage of the angioplastied segment. Further attempts at advancing were met with resistance secondary to a step-off related to the calcified plaque. After multiple attempts it was decided to stop the procedure. Final control arteriogram performed through the balloon guide catheter in the right common carotid artery continued to demonstrate excellent flow through the right external carotid artery with a mild-to-moderate stenosis at its origin. The right internal carotid artery proximally demonstrated approximately 60% patency which was maintained on subsequent 20 minute arteriogram. Centered intracranially demonstrated brisk flow into  the right anterior cerebral artery and the right middle cerebral distribution. The previously occluded anterior branch of the inferior division proximally due to a calcified plaque remained stable. The delayed arterial phase demonstrated retrograde opacification of the posterior 2/3 of the cortical branches in the sylvian triangle from the branches of the inferior division and the superior division, and also from the pericallosal branches. The balloon guide catheter was removed. The 8 French Pinnacle sheath in the left common femoral artery was removed with manual compression held for approximately 25 minutes with a quick clot to achieved hemostasis at this site. Distal pulses remained palpable in the dorsalis pedis arteries bilaterally unchanged. An immediate CT of the brain demonstrated no evidence of hemorrhagic complications. The patient was then gradually extubated without difficulty. Patient was able to maintain his airway, and oxygen concentrations. He was able to move his right side spontaneously and to command. The patient was able to follow simple commands appropriately. No  significant motor movement was evident in the left upper extremity, with only a flicker in the left foot toes. Patient was then transferred to the PACU and then subsequently the neuro ICU for post revascularization care. Later in the afternoon, the patient appeared more awake, alert and appropriately responsive. Had mild intermittent right gaze deviation but was able to track his eyes to the left past the midline. Otherwise, neurologically he remained stable unchanged. 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 PLAN: Follow-up in the clinic 4 to 6 weeks post discharge. Electronically Signed   By: Luanne Bras M.D.   On: 01/03/2022 08:12   IR CT Head  Ltd  Result Date: 01/03/2022 CLINICAL DATA:  Left-sided weakness with dysarthria. Patient with calcified embolus in the inferior division right MCA. Discovered to have high-grade stenosis of the extracranial right ICA proximally EXAM: IR ANGIO INTRA EXTRACRAN SEL COM CAROTID INNOMINATE UNI RIGHT MOD SED COMPARISON:  CT angiogram of the head and neck of Dec 30, 2021. MEDICATIONS: Heparin 3,000 units IV. Ancef 2 g IV antibiotic was administered within 1 hour of the procedure. ANESTHESIA/SEDATION: General anesthesia. CONTRAST:  Omnipaque 300 approximately 120 cc. FLUOROSCOPY TIME:  Fluoroscopy Time: 76 minutes 36) seconds (1257 mGy). COMPLICATIONS: None immediate. TECHNIQUE: Informed written consent was obtained from the patient after a thorough discussion of the procedural risks, benefits and alternatives. All questions were addressed. Maximal Sterile Barrier Technique was utilized including caps, mask, sterile gowns, sterile gloves, sterile drape, hand hygiene and skin antiseptic. A timeout was performed prior to the initiation of the procedure. The left groin was prepped and draped in the usual sterile fashion. Thereafter using modified Seldinger technique, transfemoral access into the left common femoral artery was obtained without difficulty. Over a 0.035 inch guidewire, an 8 French 25 cm Pinnacle sheath was inserted. Through this, and also over 0.035 inch guidewire, a 120 cm 6 French Simmons 2 catheter inside of an 087 95 cm balloon guide catheter combination was advanced to the aortic arch region and selectively positioned in the proximal right common carotid artery and the innominate artery. An arteriogram was then performed centered extra cranially and intracranially. FINDINGS: The innominate arteriogram demonstrates significant tortuosity of the innominate artery in its entirety extending into the right common carotid artery with a 180 degree loop in the proximal right common carotid artery. Subclavian  artery demonstrates patency with opacification of a hypoplastic right vertebral artery extra cranially. The right common carotid arteriogram demonstrates complex atherosclerotic calcified plaque at the right common carotid bifurcation extending into the bulb region of the right internal carotid artery resulting in a high-grade approximately 80-90% stenosis. More distally, the right internal carotid artery opacifies to the cranial skull base. Patency is seen of the petrous, cavernous and supraclinoid right ICA with a prominent right posterior communicating artery. Proximal portions of the right middle cerebral artery and the right anterior cerebral artery demonstrate patency. ENDOVASCULAR REVASCULARIZATION OF HIGH-GRADE STENOSIS OF THE RIGHT INTERNAL CAROTID ARTERY PROXIMALLY WITH BALLOON ANGIOPLASTY The balloon guide catheter was advanced into the proximal right common carotid artery. Through this, and also over a 0.014 inch Transend EX soft tip micro guidewire, an 021 150 cm microcatheter was then advanced with biplane DSA roadmap to the proximal cavernous right ICA followed by the microcatheter. The micro guidewire was removed. Good aspiration obtained from the hub of the microcatheter. A gentle  control arteriogram performed through the microcatheter demonstrated safe positioning of the tip of the microcatheter. This in turn was then exchanged out for a 300 cm 014 inch BMW exchange wire with a moderate J configuration. The tip of the micro guidewire was positioned at the petrous cavernous junction. Measurements were then performed of the right internal carotid artery distal to the stenosis, and just proximal to the stenosis of the distal common carotid artery. A 5 mm x 20 mm 014 inch Viatrac balloon guide catheter was then prepped and purged retrogradely with heparinized saline infusion, and retrogradely with 50% contrast and 50% heparinized saline infusion. Using the rapid exchange technique, the balloon catheter  was then advanced without difficulty and positioned with the distal and proximal markers adequate distant from the site of the severe stenosis in the proximal right internal carotid artery. Slow control inflation was then performed using micro inflation syringe device via micro tubing. Inflations were performed slowly to 9 atmospheres where it was maintained for approximately 60 seconds. Thereafter, the balloon was deflated and retrieved and removed. A control arteriogram performed through the balloon guide catheter in the proximal right common carotid artery demonstrated significantly improved caliber and flow through the angioplastied segment. More distally, free flow was noted into the distal right ICA intracranially. Over the exchange BMW micro guidewire, a 8 mm x 26 mm Wallstent stent delivery system which had been prepped with heparinized saline infusion was then advanced again using the rapid exchange technique to the proximal right common carotid artery. Resistance was encountered to further advancement of the stent delivery apparatus. At this time the balloon guide catheter was advanced to the distal right common carotid artery. Further attempts were made to advance the stent delivery apparatus into the right internal carotid proximally. Advancement could only manage just distal to the angioplastied segment with no significant coverage of the angioplastied segment. Further attempts at advancing were met with resistance secondary to a step-off related to the calcified plaque. After multiple attempts it was decided to stop the procedure. Final control arteriogram performed through the balloon guide catheter in the right common carotid artery continued to demonstrate excellent flow through the right external carotid artery with a mild-to-moderate stenosis at its origin. The right internal carotid artery proximally demonstrated approximately 60% patency which was maintained on subsequent 20 minute arteriogram.  Centered intracranially demonstrated brisk flow into the right anterior cerebral artery and the right middle cerebral distribution. The previously occluded anterior branch of the inferior division proximally due to a calcified plaque remained stable. The delayed arterial phase demonstrated retrograde opacification of the posterior 2/3 of the cortical branches in the sylvian triangle from the branches of the inferior division and the superior division, and also from the pericallosal branches. The balloon guide catheter was removed. The 8 French Pinnacle sheath in the left common femoral artery was removed with manual compression held for approximately 25 minutes with a quick clot to achieved hemostasis at this site. Distal pulses remained palpable in the dorsalis pedis arteries bilaterally unchanged. An immediate CT of the brain demonstrated no evidence of hemorrhagic complications. The patient was then gradually extubated without difficulty. Patient was able to maintain his airway, and oxygen concentrations. He was able to move his right side spontaneously and to command. The patient was able to follow simple commands appropriately. No significant motor movement was evident in the left upper extremity, with only a flicker in the left foot toes. Patient was then transferred to the PACU and then subsequently  the neuro ICU for post revascularization care. Later in the afternoon, the patient appeared more awake, alert and appropriately responsive. Had mild intermittent right gaze deviation but was able to track his eyes to the left past the midline. Otherwise, neurologically he remained stable unchanged. 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 PLAN: Follow-up in the clinic 4 to 6 weeks post discharge. Electronically Signed   By: Luanne Bras  M.D.   On: 01/03/2022 08:12   IR PTA Intracranial  Result Date: 01/03/2022 CLINICAL DATA:  Left-sided weakness with dysarthria. Patient with calcified embolus in the inferior division right MCA. Discovered to have high-grade stenosis of the extracranial right ICA proximally EXAM: IR ANGIO INTRA EXTRACRAN SEL COM CAROTID INNOMINATE UNI RIGHT MOD SED COMPARISON:  CT angiogram of the head and neck of Dec 30, 2021. MEDICATIONS: Heparin 3,000 units IV. Ancef 2 g IV antibiotic was administered within 1 hour of the procedure. ANESTHESIA/SEDATION: General anesthesia. CONTRAST:  Omnipaque 300 approximately 120 cc. FLUOROSCOPY TIME:  Fluoroscopy Time: 76 minutes 36) seconds (1257 mGy). COMPLICATIONS: None immediate. TECHNIQUE: Informed written consent was obtained from the patient after a thorough discussion of the procedural risks, benefits and alternatives. All questions were addressed. Maximal Sterile Barrier Technique was utilized including caps, mask, sterile gowns, sterile gloves, sterile drape, hand hygiene and skin antiseptic. A timeout was performed prior to the initiation of the procedure. The left groin was prepped and draped in the usual sterile fashion. Thereafter using modified Seldinger technique, transfemoral access into the left common femoral artery was obtained without difficulty. Over a 0.035 inch guidewire, an 8 French 25 cm Pinnacle sheath was inserted. Through this, and also over 0.035 inch guidewire, a 120 cm 6 French Simmons 2 catheter inside of an 087 95 cm balloon guide catheter combination was advanced to the aortic arch region and selectively positioned in the proximal right common carotid artery and the innominate artery. An arteriogram was then performed centered extra cranially and intracranially. FINDINGS: The innominate arteriogram demonstrates significant tortuosity of the innominate artery in its entirety extending into the right common carotid artery with a 180 degree loop in the  proximal right common carotid artery. Subclavian artery demonstrates patency with opacification of a hypoplastic right vertebral artery extra cranially. The right common carotid arteriogram demonstrates complex atherosclerotic calcified plaque at the right common carotid bifurcation extending into the bulb region of the right internal carotid artery resulting in a high-grade approximately 80-90% stenosis. More distally, the right internal carotid artery opacifies to the cranial skull base. Patency is seen of the petrous, cavernous and supraclinoid right ICA with a prominent right posterior communicating artery. Proximal portions of the right middle cerebral artery and the right anterior cerebral artery demonstrate patency. ENDOVASCULAR REVASCULARIZATION OF HIGH-GRADE STENOSIS OF THE RIGHT INTERNAL CAROTID ARTERY PROXIMALLY WITH BALLOON ANGIOPLASTY The balloon guide catheter was advanced into the proximal right common carotid artery. Through this, and also over a 0.014 inch Transend EX soft tip micro guidewire, an 021 150 cm microcatheter was then advanced with biplane DSA roadmap to the proximal cavernous right ICA followed by the microcatheter. The micro guidewire was removed. Good aspiration obtained from the hub of the microcatheter. A gentle control arteriogram performed through the microcatheter demonstrated safe positioning of the tip of the microcatheter. This in turn was then exchanged out for a 300 cm 014 inch BMW exchange  wire with a moderate J configuration. The tip of the micro guidewire was positioned at the petrous cavernous junction. Measurements were then performed of the right internal carotid artery distal to the stenosis, and just proximal to the stenosis of the distal common carotid artery. A 5 mm x 20 mm 014 inch Viatrac balloon guide catheter was then prepped and purged retrogradely with heparinized saline infusion, and retrogradely with 50% contrast and 50% heparinized saline infusion. Using  the rapid exchange technique, the balloon catheter was then advanced without difficulty and positioned with the distal and proximal markers adequate distant from the site of the severe stenosis in the proximal right internal carotid artery. Slow control inflation was then performed using micro inflation syringe device via micro tubing. Inflations were performed slowly to 9 atmospheres where it was maintained for approximately 60 seconds. Thereafter, the balloon was deflated and retrieved and removed. A control arteriogram performed through the balloon guide catheter in the proximal right common carotid artery demonstrated significantly improved caliber and flow through the angioplastied segment. More distally, free flow was noted into the distal right ICA intracranially. Over the exchange BMW micro guidewire, a 8 mm x 26 mm Wallstent stent delivery system which had been prepped with heparinized saline infusion was then advanced again using the rapid exchange technique to the proximal right common carotid artery. Resistance was encountered to further advancement of the stent delivery apparatus. At this time the balloon guide catheter was advanced to the distal right common carotid artery. Further attempts were made to advance the stent delivery apparatus into the right internal carotid proximally. Advancement could only manage just distal to the angioplastied segment with no significant coverage of the angioplastied segment. Further attempts at advancing were met with resistance secondary to a step-off related to the calcified plaque. After multiple attempts it was decided to stop the procedure. Final control arteriogram performed through the balloon guide catheter in the right common carotid artery continued to demonstrate excellent flow through the right external carotid artery with a mild-to-moderate stenosis at its origin. The right internal carotid artery proximally demonstrated approximately 60% patency which  was maintained on subsequent 20 minute arteriogram. Centered intracranially demonstrated brisk flow into the right anterior cerebral artery and the right middle cerebral distribution. The previously occluded anterior branch of the inferior division proximally due to a calcified plaque remained stable. The delayed arterial phase demonstrated retrograde opacification of the posterior 2/3 of the cortical branches in the sylvian triangle from the branches of the inferior division and the superior division, and also from the pericallosal branches. The balloon guide catheter was removed. The 8 French Pinnacle sheath in the left common femoral artery was removed with manual compression held for approximately 25 minutes with a quick clot to achieved hemostasis at this site. Distal pulses remained palpable in the dorsalis pedis arteries bilaterally unchanged. An immediate CT of the brain demonstrated no evidence of hemorrhagic complications. The patient was then gradually extubated without difficulty. Patient was able to maintain his airway, and oxygen concentrations. He was able to move his right side spontaneously and to command. The patient was able to follow simple commands appropriately. No significant motor movement was evident in the left upper extremity, with only a flicker in the left foot toes. Patient was then transferred to the PACU and then subsequently the neuro ICU for post revascularization care. Later in the afternoon, the patient appeared more awake, alert and appropriately responsive. Had mild intermittent right gaze deviation but was able to  track his eyes to the left past the midline. Otherwise, neurologically he remained stable unchanged. 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 PLAN: Follow-up in the clinic 4 to 6 weeks post  discharge. Electronically Signed   By: Luanne Bras M.D.   On: 01/03/2022 08:12   DG Swallowing Func-Speech Pathology  Result Date: 01/02/2022 Table formatting from the original result was not included. Objective Swallowing Evaluation: Type of Study: MBS-Modified Barium Swallow Study  Patient Details Name: KHALIFA KNECHT MRN: [id number] Date of Birth: 1932-01-13 Today's Date: 01/02/2022 Time: SLP Start Time (ACUTE ONLY): 1100 -SLP Stop Time (ACUTE ONLY): 1123 SLP Time Calculation (min) (ACUTE ONLY): 23 min Past Medical History: Past Medical History: Diagnosis Date  Arthritis   Heart murmur   asa child   Hypertension   Stroke [facility])  Past Surgical History: Past Surgical History: Procedure Laterality Date  EYE SURGERY Bilateral 2022  JOINT REPLACEMENT    RADIOLOGY WITH ANESTHESIA N/A 01/01/2022  Procedure: Angiogram;  Surgeon: Luanne Bras, MD;  Location: [place];  Service: Radiology;  Laterality: N/A;  TONSILLECTOMY    TOTAL KNEE ARTHROPLASTY Right 03/30/2013  Procedure: RIGHT TOTAL KNEE ARTHROPLASTY;  Surgeon: Tobi Bastos, MD;  Location: [facility];  Service: Orthopedics;  Laterality: Right;  TOTAL KNEE ARTHROPLASTY Left 10/09/2020  Procedure: TOTAL KNEE ARTHROPLASTY;  Surgeon: Paralee Cancel, MD;  Location: [facility];  Service: Orthopedics;  Laterality: Left;  70 mins HPI: 88 yo male presenting to the ED on 5/28 with L sided weakness, facial droop, slurred speech, and fall. 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 PMH including hypothyroidism chronic pain syndromes, CKD, HTN, arthritis, bil TKA.  Subjective: alert, friendly  Recommendations for follow up therapy are one component of a multi-disciplinary discharge planning process, led by the attending physician.  Recommendations may be updated based on patient status, additional functional criteria and insurance authorization. Assessment / Plan / Recommendation   01/02/2022  11:00 AM Clinical Impressions  Clinical Impression Pt presents with oropharyngeal dysphagia characterized by reduced labial seal, impaired mastication, a pharyngeal delay and reduction in bolus cohesion, posterior bolus propulsion, tongue base retraction, anterior laryngeal movement, and pharyngeal constriction. He demonstrated left-sided anterior spillage, lingual residue, difficulty with A-P transport of solids, premature spillage to the valleculae and pyriform sinuses, and posterior pharyngeal wall residue. A liquid bolus was necessary to facilitate mastication and A-P transport of a nutrigrain bar. Liquid washes reduced pharyngeal residue to a more functional level and a left head turn was effective in reducing pyriform sinus residue with thin liquids, but pt exhibited notable difficulty maintaining this position. Penetration (PAS 3, 5) and aspiration (PAS 7, 8) were noted with thin liquids. Larger amounts of aspirate did trigger a cough which was mostly effective, but smaller quantities inconsistently triggered throat clearing which was inadequate for expulsion of material. No functional benefit was noted with prompted coughing. A dysphagia 1 diet with nectar thick liquids is recommended at this time. SLP will follow for dysphagia treatment. SLP Visit Diagnosis Dysphagia, oropharyngeal phase (R13.12) Impact on safety and function Mild aspiration risk     01/02/2022  11:00 AM Treatment Recommendations Treatment Recommendations Therapy as outlined in treatment plan below     01/02/2022  11:00 AM Prognosis Prognosis for Safe Diet Advancement Good   01/02/2022  11:00 AM Diet Recommendations SLP Diet  Recommendations Dysphagia 1 (Puree) solids;Nectar thick liquid Liquid Administration via Cup;Straw Medication Administration Whole meds with puree Compensations Slow rate;Small sips/bites;Monitor for anterior loss;Follow solids with liquid Postural Changes Seated upright at 90 degrees     01/02/2022  11:00 AM Other Recommendations Other Recommendations Order  thickener from pharmacy Follow Up Recommendations Acute inpatient rehab (3hours/day) Assistance recommended at discharge Frequent or constant Supervision/Assistance Functional Status Assessment Patient has had a recent decline in their functional status and demonstrates the ability to make significant improvements in function in a reasonable and predictable amount of time.   01/02/2022  11:00 AM Frequency and Duration  Speech Therapy Frequency (ACUTE ONLY) min 2x/week Treatment Duration 2 weeks     01/02/2022  11:00 AM Oral Phase Oral Phase Impaired Oral - Nectar Cup Left anterior bolus loss;Reduced posterior propulsion;Lingual/palatal residue;Delayed oral transit;Decreased bolus cohesion;Premature spillage Oral - Nectar Straw Left anterior bolus loss;Reduced posterior propulsion;Lingual/palatal residue;Delayed oral transit;Decreased bolus cohesion;Premature spillage Oral - Thin Cup Left anterior bolus loss;Reduced posterior propulsion;Lingual/palatal residue;Delayed oral transit;Decreased bolus cohesion;Premature spillage Oral - Thin Straw Left anterior bolus loss;Reduced posterior propulsion;Lingual/palatal residue;Delayed oral transit;Decreased bolus cohesion;Premature spillage Oral - Puree Left anterior bolus loss;Reduced posterior propulsion;Lingual/palatal residue;Delayed oral transit;Decreased bolus cohesion;Weak lingual manipulation Oral - Mech Soft Left anterior bolus loss;Reduced posterior propulsion;Lingual/palatal residue;Delayed oral transit;Decreased bolus cohesion;Weak lingual manipulation;Impaired mastication Oral - Pill Left anterior bolus loss;Reduced posterior propulsion;Lingual/palatal residue;Delayed oral transit;Decreased bolus cohesion    01/02/2022  11:00 AM Pharyngeal Phase Pharyngeal Phase Impaired Pharyngeal- Thin Cup Reduced tongue base retraction;Reduced anterior laryngeal mobility;Delayed swallow initiation-pyriform sinuses;Reduced pharyngeal peristalsis;Penetration/Aspiration during  swallow;Penetration/Apiration after swallow;Pharyngeal residue - valleculae;Pharyngeal residue - pyriform;Pharyngeal residue - posterior pharnyx Pharyngeal Material enters airway, remains ABOVE vocal cords and not ejected out;Material enters airway, CONTACTS cords and not ejected out;Material enters airway, passes BELOW cords and not ejected out despite cough attempt by patient;Material enters airway, passes BELOW cords without attempt by patient to eject out (silent aspiration) Pharyngeal- Thin Straw Reduced tongue base retraction;Reduced anterior laryngeal mobility;Delayed swallow initiation-pyriform sinuses;Reduced pharyngeal peristalsis;Penetration/Aspiration during swallow;Penetration/Apiration after swallow;Pharyngeal residue - valleculae;Pharyngeal residue - pyriform;Pharyngeal residue - posterior pharnyx Pharyngeal Material enters airway, passes BELOW cords and not ejected out despite cough attempt by patient;Material enters airway, passes BELOW cords without attempt by patient to eject out (silent aspiration);Material enters airway, CONTACTS cords and then ejected out Pharyngeal- Puree Reduced tongue base retraction;Reduced anterior laryngeal mobility;Delayed swallow initiation-pyriform sinuses;Reduced pharyngeal peristalsis;Pharyngeal residue - valleculae;Pharyngeal residue - pyriform;Pharyngeal residue - posterior pharnyx Pharyngeal- Mechanical Soft Reduced tongue base retraction;Reduced anterior laryngeal mobility;Delayed swallow initiation-pyriform sinuses;Reduced pharyngeal peristalsis;Pharyngeal residue - valleculae;Pharyngeal residue - pyriform;Pharyngeal residue - posterior pharnyx Pharyngeal- Pill Reduced tongue base retraction;Reduced anterior laryngeal mobility;Delayed swallow initiation-pyriform sinuses;Reduced pharyngeal peristalsis;Pharyngeal residue - valleculae;Pharyngeal residue - pyriform;Pharyngeal residue - posterior pharnyx    01/02/2022  11:00 AM Cervical Esophageal Phase  Cervical  Esophageal Phase [facility] Shanika I. Hardin Negus, [place], [place] Office number [phone] Pager [phone] Horton Marshall 01/02/2022, 12:20 PM                     VAS US CAROTID  Result Date: 12/31/2021 Carotid Arterial Duplex Study Patient Name:  TYJAI MATUSZAK  Date of Exam:   12/30/2021 Medical Rec #: [id number]          Accession #:    [id number] Date of Birth: 02-07-1932           Patient Gender: M Patient Age:   73 years Exam Location:  [facility]  Procedure:      VAS US CAROTID Referring Phys: Nyoka Lint DOUTOVA --------------------------------------------------------------------------------  Indications:       CVA. Risk Factors:      Hypertension. Comparison Study:  No prior study Performing Technologist: Maudry Mayhew MHA, RDMS, RVT, RDCS  Examination Guidelines: A complete evaluation includes B-mode imaging, spectral Doppler, color Doppler, and power Doppler as needed of all accessible portions of each vessel. Bilateral testing is considered an integral part of a complete examination. Limited examinations for reoccurring indications may be performed as noted.  Right Carotid Findings: +----------+-------+--------+--------+-------------------------------+---------+           PSV    EDV cm/sStenosisPlaque Description             Comments            cm/s                                                            +----------+-------+--------+--------+-------------------------------+---------+ CCA Prox  54     7               heterogenous and irregular               +----------+-------+--------+--------+-------------------------------+---------+ CCA Distal58     9               smooth and heterogenous                  +----------+-------+--------+--------+-------------------------------+---------+ ICA Prox  132    19              smooth, heterogenous and       Shadowing                                  calcific                                  +----------+-------+--------+--------+-------------------------------+---------+ ICA Distal78     15                                                       +----------+-------+--------+--------+-------------------------------+---------+ ECA       126                                                             +----------+-------+--------+--------+-------------------------------+---------+ +----------+--------+-------+----------------+-------------------+           PSV cm/sEDV cmsDescribe        Arm Pressure (mmHG) +----------+--------+-------+----------------+-------------------+ [id number]            Multiphasic, WNL                    +----------+--------+-------+----------------+-------------------+ +---------+--------+--+--------+-+---------+ VertebralPSV cm/s51EDV cm/s5Antegrade +---------+--------+--+--------+-+---------+  Left Carotid Findings: +----------+--------+--------+--------+------------------------------+---------+           PSV cm/sEDV cm/sStenosisPlaque Description  Comments  +----------+--------+--------+--------+------------------------------+---------+ CCA Prox  89                      heterogenous and irregular    tortuous  +----------+--------+--------+--------+------------------------------+---------+ CCA Distal70      8                                                       +----------+--------+--------+--------+------------------------------+---------+ ICA Prox  49      12              heterogenous, irregular and   Shadowing                                   calcific                                +----------+--------+--------+--------+------------------------------+---------+ ICA Distal98      18                                                      +----------+--------+--------+--------+------------------------------+---------+ ECA       67      10                                                       +----------+--------+--------+--------+------------------------------+---------+ +----------+--------+--------+----------------+-------------------+           PSV cm/sEDV cm/sDescribe        Arm Pressure (mmHG) +----------+--------+--------+----------------+-------------------+ [id number]             Multiphasic, WNL                    +----------+--------+--------+----------------+-------------------+ +---------+--------+--+--------+--+---------+ VertebralPSV cm/s94EDV cm/s14Antegrade +---------+--------+--+--------+--+---------+   Summary: Right Carotid: Velocities in the right ICA are consistent with a 1-39% stenosis. Left Carotid: Velocities in the left ICA are consistent with a 1-39% stenosis. Vertebrals:  Bilateral vertebral arteries demonstrate antegrade flow. Subclavians: Normal flow hemodynamics were seen in bilateral subclavian              arteries. *See table(s) above for measurements and observations.  Electronically signed by Antony Contras MD on 12/31/2021 at 3:59:42 PM.    Final    ECHOCARDIOGRAM COMPLETE  Result Date: 12/30/2021    ECHOCARDIOGRAM REPORT   Patient Name:   EDUARDO HONOR Date of Exam: 12/30/2021 Medical Rec #:  [id number]         Height:       66.0 in Accession #:    [id number]        Weight:       133.6 lb Date of Birth:  [birth year]          BSA:          1.685 m Patient Age:    89 years          BP:           161/84  mmHg Patient Gender: M                 HR:           66 bpm. Exam Location:  Inpatient Procedure: 2D Echo Indications:    stroke  History:        Patient has prior history of Echocardiogram examinations, most                 recent 04/25/2020. Chronic kidney disease.; Risk                 Factors:Hypertension and Dyslipidemia.  Sonographer:    Johny Chess RDCS Referring Phys: [place]  1. Left ventricular ejection fraction, by estimation, is 55 to 60%. The left ventricle has normal function. The left  ventricle has no regional wall motion abnormalities. There is mild left ventricular hypertrophy. Left ventricular diastolic parameters are consistent with Grade I diastolic dysfunction (impaired relaxation).  2. Right ventricular systolic function is normal. The right ventricular size is normal.  3. Left atrial size was mildly dilated.  4. The mitral valve is normal in structure. Mild mitral valve regurgitation. No evidence of mitral stenosis.  5. The aortic valve is tricuspid. Aortic valve regurgitation is mild. Aortic valve sclerosis/calcification is present, without any evidence of aortic stenosis.  6. The inferior vena cava is normal in size with greater than 50% respiratory variability, suggesting right atrial pressure of 3 mmHg. FINDINGS  Left Ventricle: Left ventricular ejection fraction, by estimation, is 55 to 60%. The left ventricle has normal function. The left ventricle has no regional wall motion abnormalities. The left ventricular internal cavity size was normal in size. There is  mild left ventricular hypertrophy. Left ventricular diastolic parameters are consistent with Grade I diastolic dysfunction (impaired relaxation). Right Ventricle: The right ventricular size is normal. Right ventricular systolic function is normal. Left Atrium: Left atrial size was mildly dilated. Right Atrium: Right atrial size was normal in size. Pericardium: Trivial pericardial effusion is present. Mitral Valve: The mitral valve is normal in structure. Mild mitral annular calcification. Mild mitral valve regurgitation. No evidence of mitral valve stenosis. Tricuspid Valve: The tricuspid valve is normal in structure. Tricuspid valve regurgitation is trivial. No evidence of tricuspid stenosis. Aortic Valve: The aortic valve is tricuspid. Aortic valve regurgitation is mild. Aortic regurgitation PHT measures 400 msec. Aortic valve sclerosis/calcification is present, without any evidence of aortic stenosis. Pulmonic Valve: The  pulmonic valve was normal in structure. Pulmonic valve regurgitation is trivial. No evidence of pulmonic stenosis. Aorta: The aortic root is normal in size and structure. Venous: The inferior vena cava is normal in size with greater than 50% respiratory variability, suggesting right atrial pressure of 3 mmHg. IAS/Shunts: No atrial level shunt detected by color flow Doppler.  LEFT VENTRICLE PLAX 2D LVIDd:         5.20 cm   Diastology LVIDs:         3.10 cm   LV e' medial:    4.50 cm/s LV PW:         1.20 cm   LV E/e' medial:  12.0 LV IVS:        1.10 cm   LV e' lateral:   4.20 cm/s LVOT diam:     2.10 cm   LV E/e' lateral: 12.9 LV SV:         80 LV SV Index:   47 LVOT Area:     3.46 cm  RIGHT VENTRICLE  IVC RV S prime:     15.70 cm/s  IVC diam: 1.00 cm TAPSE (M-mode): 2.0 cm LEFT ATRIUM             Index        RIGHT ATRIUM           Index LA diam:        3.40 cm 2.02 cm/m   RA Area:     16.30 cm LA Vol (A2C):   79.2 ml 47.01 ml/m  RA Volume:   39.80 ml  23.62 ml/m LA Vol (A4C):   47.5 ml 28.20 ml/m LA Biplane Vol: 63.0 ml 37.40 ml/m  AORTIC VALVE LVOT Vmax:   102.00 cm/s LVOT Vmean:  67.600 cm/s LVOT VTI:    0.230 m AI PHT:      400 msec  AORTA Ao Root diam: 3.70 cm Ao Asc diam:  3.10 cm MITRAL VALVE MV Area (PHT): 4.60 cm    SHUNTS MV Decel Time: 165 msec    Systemic VTI:  0.23 m MR Peak grad: 134.6 mmHg   Systemic Diam: 2.10 cm MR Mean grad: 82.0 mmHg MR Vmax:      580.00 cm/s MR Vmean:     416.0 cm/s MV E velocity: 54.00 cm/s MV A velocity: 96.40 cm/s MV E/A ratio:  0.56 Kirk Ruths MD Electronically signed by Kirk Ruths MD Signature Date/Time: 12/30/2021/2:40:23 PM    Final    CT ANGIO HEAD NECK W WO CM  Result Date: 12/30/2021 CLINICAL DATA:  Stroke/TIA, determine embolic source EXAM: CT ANGIOGRAPHY HEAD AND NECK TECHNIQUE: Multidetector CT imaging of the head and neck was performed using the standard protocol during bolus administration of intravenous contrast. Multiplanar CT image  reconstructions and MIPs were obtained to evaluate the vascular anatomy. Carotid stenosis measurements (when applicable) are obtained utilizing NASCET criteria, using the distal internal carotid diameter as the denominator. RADIATION DOSE REDUCTION: This exam was performed according to the departmental dose-optimization program which includes automated exposure control, adjustment of the mA and/or kV according to patient size and/or use of iterative reconstruction technique. CONTRAST:  36m OMNIPAQUE IOHEXOL 350 MG/ML SOLN COMPARISON:  Same day MRI/MRA. FINDINGS: CT HEAD FINDINGS Brain: Small right MCA territory infarcts better characterized on same day MRI. No evidence of acute hemorrhage, mass effect, midline shift, extra-axial fluid collection, or hydrocephalus. Patchy white matter hypodensities, nonspecific but compatible with chronic microvascular ischemic disease. Vascular: Detailed below. Skull: No acute fracture. Sinuses: Mild paranasal sinus mucosal thickening. Orbits: No acute orbital findings. Review of the MIP images confirms the above findings CTA NECK FINDINGS Aortic arch: Calcific atherosclerosis of the aorta and great vessel origins. Ectatic right subclavian artery. Right carotid system: Atherosclerosis at the carotid bifurcation with approximately 80% stenosis. Left carotid system: Atherosclerosis at the carotid bifurcation without greater than 50% stenosis. Vertebral arteries: Potentially severe stenosis of the right vertebral artery origin due to calcific atherosclerosis. Otherwise, vertebral arteries are patent without significant (greater than 50%) stenosis. Left dominant. Skeleton: Severe multilevel degenerative change. Other neck: No acute findings. Upper chest: Clear sinuses. Review of the MIP images confirms the above findings CTA HEAD FINDINGS Anterior circulation: Bilateral intracranial ICAs are patent with mild narrowing due to calcific atherosclerosis. Bilateral M1 MCAs and left M2 MCAs  are patent. Apparent occlusion of a right M2 MCA branch in a region of calcification. Bilateral ACAs are patent. Posterior circulation: Left dominant intradural vertebral artery. Right intradural vertebral artery makes very small contribution to the basilar artery. Basilar artery  and bilateral posterior cerebral arteries are patent without proximal hemodynamically significant stenosis. Venous sinuses: As permitted by contrast timing, patent. Anatomic variants: Detailed above. Review of the MIP images confirms the above findings IMPRESSION: CTA: 1. 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 2. Extensive calcific atherosclerosis at the right carotid bifurcation with approximately 80% stenosis of the proximal ICA. 3. 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 CT head: Small right MCA territory infarcts better characterized on same day MRI. No evidence of progressive mass effect or acute hemorrhage. Findings discussed with Dr. Erlinda Hong via telephone at 12:50 p.m. Electronically Signed   By: Margaretha Sheffield M.D.   On: 12/30/2021 13:00   MR BRAIN WO CONTRAST  Result Date: 12/30/2021 CLINICAL DATA:  There are deficit with acute stroke suspected EXAM: MRI HEAD WITHOUT CONTRAST MRA HEAD WITHOUT CONTRAST TECHNIQUE: Multiplanar, multi-echo pulse sequences of the brain and surrounding structures were acquired without intravenous contrast. Angiographic images of the Circle of Willis were acquired using MRA technique without intravenous contrast. COMPARISON:  Head CT from yesterday FINDINGS: MRI HEAD FINDINGS Brain: Cluster of small acute infarcts in the right insular cortex, right frontal cortex, and deep right cerebral white matter. These are in a MCA distribution. Background of advanced chronic small vessel ischemia with confluent  gliosis in the hemispheric white matter. Chronic lacunar infarcts in the deep cerebrum. Small remote left cerebral infarct. Gradient signal at the lower right sylvian fissure with there is calcification along the MCA branches by CT. Vascular: Major flow voids are preserved Skull and upper cervical spine: Normal marrow signal Sinuses/Orbits: Bilateral cataract resection MRA HEAD FINDINGS Anterior circulation: High-grade narrowing involving the upper right M2 branch with there is a gradient hypointensity noted above. Elsewhere vessels are smoothly contoured and widely patent. Posterior circulation: Vertebrobasilar arteries are smoothly contoured and widely patent. IMPRESSION: Brain MRI: 1. Cluster of small cortical and white matter infarcts within the right MCA distribution. 2. Background of advanced chronic small vessel ischemia. Intracranial MRA: Focal high-grade narrowing at the right M2 level where there is calcification by CT. This could reflect calcified plaque or a calcified embolus. If an embolus, the proximal nature and degree of mild infarct with suggest additional territory of risk. Electronically Signed   By: Jorje Guild M.D.   On: 12/30/2021 07:53   MR ANGIO HEAD WO CONTRAST  Result Date: 12/30/2021 CLINICAL DATA:  There are deficit with acute stroke suspected EXAM: MRI HEAD WITHOUT CONTRAST MRA HEAD WITHOUT CONTRAST TECHNIQUE: Multiplanar, multi-echo pulse sequences of the brain and surrounding structures were acquired without intravenous contrast. Angiographic images of the Circle of Willis were acquired using MRA technique without intravenous contrast. COMPARISON:  Head CT from yesterday FINDINGS: MRI HEAD FINDINGS Brain: Cluster of small acute infarcts in the right insular cortex, right frontal cortex, and deep right cerebral white matter. These are in a MCA distribution. Background of advanced chronic small vessel ischemia with confluent gliosis in the hemispheric white matter. Chronic  lacunar infarcts in the deep cerebrum. Small remote left cerebral infarct. Gradient signal at the lower right sylvian fissure with there is calcification along the MCA branches by CT. Vascular: Major flow voids are preserved Skull and upper cervical spine: Normal marrow signal Sinuses/Orbits: Bilateral cataract resection MRA HEAD FINDINGS Anterior circulation: High-grade narrowing involving the upper right M2 branch with there is a gradient hypointensity noted  above. Elsewhere vessels are smoothly contoured and widely patent. Posterior circulation: Vertebrobasilar arteries are smoothly contoured and widely patent. IMPRESSION: Brain MRI: 1. Cluster of small cortical and white matter infarcts within the right MCA distribution. 2. Background of advanced chronic small vessel ischemia. Intracranial MRA: Focal high-grade narrowing at the right M2 level where there is calcification by CT. This could reflect calcified plaque or a calcified embolus. If an embolus, the proximal nature and degree of mild infarct with suggest additional territory of risk. Electronically Signed   By: Jorje Guild M.D.   On: 12/30/2021 07:53   DG Chest Portable 1 View  Result Date: 12/29/2021 CLINICAL DATA:  CVA. Fell last night. Family concerned patient injured left side EXAM: PORTABLE CHEST 1 VIEW COMPARISON:  Chest two views 03/23/2013 FINDINGS: Cardiac silhouette is mildly enlarged. Moderate calcifications within the aortic arch and descending thoracic aorta. Minimal bibasilar horizontal linear chronic scarring is unchanged. No focal airspace opacity to indicate pneumonia. No pleural effusion or pneumothorax. Moderate multilevel degenerative disc changes of the thoracic spine. IMPRESSION: Mild stable cardiomegaly. No acute lung process. Electronically Signed   By: Yvonne Kendall M.D.   On: 12/29/2021 19:19   DG Hand Complete Left  Result Date: 12/29/2021 CLINICAL DATA:  Fall.  Bruising to posterior left hand. EXAM: LEFT HAND -  COMPLETE 3+ VIEW COMPARISON:  None Available. FINDINGS: There is diffuse decreased bone mineralization. A pulse oximeter overlies the distal index finger obscuring portions. Degenerative changes including joint space narrowing, subchondral sclerosis and peripheral osteophytosis are moderate at the triscaphe joint; moderate to severe at the thumb carpometacarpal, thumb metacarpophalangeal, and thumb interphalangeal joints; and moderate to severe at the DIP joints and moderate at the PIP joints of the second through fifth fingers. No acute fracture is seen. No dislocation. IMPRESSION: Osteoarthritis as above. No acute fracture. Electronically Signed   By: Yvonne Kendall M.D.   On: 12/29/2021 18:12   DG Elbow Complete Left  Result Date: 12/29/2021 CLINICAL DATA:  Fall.  Left elbow pain and bruising. EXAM: LEFT ELBOW - COMPLETE 3+ VIEW COMPARISON:  None Available. FINDINGS: No fracture or dislocation. Narrowed radiocapitellar joint. Marginal osteophytes project from the base of the radial head. No joint effusion. There is soft tissue edema posteriorly. IMPRESSION: No fracture or dislocation. Electronically Signed   By: Lajean Manes M.D.   On: 12/29/2021 18:12   CT Cervical Spine Wo Contrast  Result Date: 12/29/2021 CLINICAL DATA:  There logic deaf sick, neck trauma EXAM: CT CERVICAL SPINE WITHOUT CONTRAST TECHNIQUE: Multidetector CT imaging of the cervical spine was performed without intravenous contrast. Multiplanar CT image reconstructions were also generated. RADIATION DOSE REDUCTION: This exam was performed according to the departmental dose-optimization program which includes automated exposure control, adjustment of the mA and/or kV according to patient size and/or use of iterative reconstruction technique. COMPARISON:  None Available. FINDINGS: Alignment: Mild degenerative anterolisthesis of C2 and C3. Otherwise alignment is anatomic. Skull base and vertebrae: No acute fracture. No primary bone lesion or  focal pathologic process. Soft tissues and spinal canal: No prevertebral fluid or swelling. No visible canal hematoma. Disc levels: Partial bony fusion across the disc spaces at C3-4 and C4-5. There is severe multilevel spondylosis throughout the remainder of the cervical spine, greatest at C5-6 and C6-7. Diffuse facet hypertrophy. Upper chest: Airway is patent. Lung apices are clear. Prominent atherosclerosis of the aortic arch. Other: Reconstructed images demonstrate no additional findings. IMPRESSION: 1. Extensive multilevel cervical degenerative changes. No acute fracture. Electronically Signed  By: Randa Ngo M.D.   On: 12/29/2021 17:38   CT HEAD WO CONTRAST  Result Date: 12/29/2021 CLINICAL DATA:  Neurologic deficit EXAM: CT HEAD WITHOUT CONTRAST TECHNIQUE: Contiguous axial images were obtained from the base of the skull through the vertex without intravenous contrast. RADIATION DOSE REDUCTION: This exam was performed according to the departmental dose-optimization program which includes automated exposure control, adjustment of the mA and/or kV according to patient size and/or use of iterative reconstruction technique. COMPARISON:  None Available. FINDINGS: Brain: Confluent hypodensities throughout the periventricular white matter are most consistent with age-indeterminate small vessel ischemic changes, likely chronic. Focal hypodensities in the left basal ganglia and right frontal periventricular white matter consistent with chronic lacunar infarcts. Age-indeterminate lacunar infarct is seen within the right basal ganglia image 18/3. No other signs of acute infarct or hemorrhage. Lateral ventricles and midline structures are otherwise unremarkable. No acute extra-axial fluid collections. No mass effect. Vascular: No hyperdense vessel or unexpected calcification. Skull: Normal. Negative for fracture or focal lesion. Sinuses/Orbits: No acute finding. Other: None. IMPRESSION: 1. Age indeterminate  lacunar infarct within the right basal ganglia. 2. Age-indeterminate small-vessel ischemic changes throughout the periventricular white matter, favor chronic. 3. Chronic left basal ganglia and right frontal white matter lacunar infarcts as above. 4. No acute hemorrhage. Electronically Signed   By: Randa Ngo M.D.   On: 12/29/2021 17:35     PHYSICAL EXAM  Temp:  [98.1 F (36.7 C)-99.8 F (37.7 C)] 98.7 F (37.1 C) (06/20 1200) Pulse Rate:  [54-87] 74 (06/20 0600) Resp:  [15-27] 15 (06/20 0600) BP: (116-171)/(47-83) 161/76 (06/20 1326) SpO2:  [100 %] 100 % (06/20 0600) FiO2 (%):  [30 %-100 %] 100 % (06/19 1655) Weight:  [62 kg] 62 kg (06/20 0500)  General - Well nourished, well developed, .  Ophthalmologic - fundi not visualized due to noncooperation.  Cardiovascular - Regular rate and rhythm.  Neuro -drowsy, eyes closed but open on voice, following few commands on the right side only  . With eye opening, eyes in mid position, inconsistently blinking to visual threat, doll's eyes present, not tracking.  Corneal reflex present, gag and cough present. Breathing over the vent.  Facial symmetry not able to test due to ET tube.  Tongue protrusion not cooperative. Spontaneous moving right UE against gravity and LE with knee flexion, but left upper extremity flaccid, left lower extremity flicker toe movement with painful stimuli. DTR diminished and no babinski. Sensation, coordination not cooperative and gait not tested.    ASSESSMENT/PLAN Mr. GAURAV BALDREE is a 88 y.o. male with history of hypertension, hyperlipidemia, hearing loss, recent stroke status post right carotid angioplasty transfer back from CIR due to aspiration, respite failure.  Found to have severe anemia with GI bleeding, renal failure and seizure-like activity.  Stroke:  right MCA infarct extended from recent infarct, likely secondary to hypertension in the setting of sepsis and GI bleeding with known right ICA stenosis  s/p angioplasty  Recent stroke: right MCA scattered small infarcts due to right M2 occlusion by calcified plaque,  secondary to large vessel disease source from right ICA high-grade stenosis CT 5/28 no acute abnormality, old bilateral BG, right frontal white matter lacunar infarcts. MRA right M 2 high-grade stenosis MRI 5/29 right MCA scattered small infarcts CT head and neck 5/29 proximal right M2 occlusion due to calcified embolus. Extensive calcified atherosclerosis at right ICA bifurcation with 80% stenosis.  Severe stenosis nondominant right VA origin.  CT head 6/15 - moderate-sized  acute/subacute cortical and subcortical infarct within the right frontoparietal lobes and right insula  MRI and MRA pending CUS repeat pending 2D Echo EF 55 to 60% LDL 90 HgbA1c 6.1 aspirin 81 mg daily and Brilinta (ticagrelor) 90 mg bid prior to admission, now on aspirin 81 mg daily and Brilinta (ticagrelor) 90 mg bid after anemia stabilized Ongoing aggressive stroke risk factor management Therapy recommendations:  pending Disposition: Pending  Seizure like activity 6/15 episode of left gaze, right upper extremity posturing, status post Ativan EEG stat no seizure, moderate to severe diffuse encephalopathy Off LTM  Respiratory failure Aspiration pneumonia on Unasyn CCM on board Intubated for airway protection Vent management per CCM  Carotid stenosis CT head and neck 5/29 extensive calcified atherosclerosis at right ICA bifurcation with 80% stenosis. Likely the source for right M2 occlusion and right MCA stroke Dr. Estanislado Pandy did right carotid angioplasty 5/31, not able to have stent placement due to tortuosity of the vessel Continue Brilinta 90 mg twice daily and aspirin 81 mg daily if no more GI bleeding GIB Large dark stool 6/15 Severe anemia Hb 8.2->8.0->5.6->PRBC->9.9-> 9.5->9.5 GI on board EGD 6/16 found food in stomach and no significance and source of bleeding Close  monitoring  History of hypertension Hypotension, improved Likely due to septic shock and severe anemia On IV fluid, received PRBC transfusion Off Levophed BP goal normotensive   Hyperlipidemia Home meds: Lovastatin 40 LDL 90, goal < 70 Now on Crestor 20 Continue statin at discharge   AKI on CKD Creatinine 1.3->3.84->3.24->3.12->2.95 CCM on board On TF @ 50 Avoid nephrotoxicity agents  Dysphagia On tube feeding now  Other Stroke Risk Factors Advanced age   Other Active Problems Hard of hearing  Hospital day # 6 Patient self extubated himself.  Family meeting about goals of care yesterday made him DNR but want appropriate medical care for now.  Speech therapy to do swallow eval unable to swallow will need feeding tube.  Continue aspirin and Brilinta.  Physical occupational and speech therapy consults.  Mobilize out of bed as tolerated.  This patient is critically ill due to right MCA infarct extension, right ICA stenosis, respiratory failure, intubated on vent, aspiration pneumonia, GIB with severe anemia and at significant risk of neurological worsening, death form recurrent stroke, hemorrhagic conversion, severe anemia, respiratory failure. This patient's care requires constant monitoring of vital signs, hemodynamics, respiratory and cardiac monitoring, review of multiple databases, neurological assessment, discussion with family, other specialists and medical decision making of high complexity. I spent 30 minutes of neurocritical care time in the care of this patient.  Discussed with patient's RN at the bedside and answered questions  Antony Contras, MD  Stroke Neurology 01/21/2022 1:31 PM      To contact Stroke Continuity provider, please refer to [email or web address] After hours, contact General Neurology

## 2022-01-21 NOTE — TOC Progression Note (Signed)
Transition of Care (TOC) - Initial/Assessment Note   Transition of Care Department (TOC) has reviewed patient.  Patient self extubated yesterday.  PT and OT recommendations are pending.   We will continue to monitor patient advancement through interdisciplinary progression rounds.      Patient Goals and CMS Choice        Expected Discharge Plan and Services                                                Prior Living Arrangements/Services                       Activities of Daily Living      Permission Sought/Granted                  Emotional Assessment              Admission diagnosis:  Acute respiratory failure with hypoxia ([place]) [J96.01] Patient Active Problem List   Diagnosis Date Noted   Malnutrition of moderate degree 01/16/2022   Septic shock ([place])    AKI (acute kidney injury) ([place])    Hyperkalemia    Acute respiratory failure with hypoxia ([place]) 01/18/2022   Protein-calorie malnutrition, severe 01/11/2022   Right middle cerebral artery stroke ([place]) 01/02/2022   Internal carotid artery stenosis, right 01/01/2022   Stroke (cerebrum) ([place]) 12/29/2021   CKD (chronic kidney disease) stage 3, GFR 30-59 ml/min ([facility]) 12/29/2021   Anemia 12/29/2021   Hyperlipidemia 12/29/2021   Essential hypertension 12/29/2021   Osteoarthritis of left knee 10/09/2020   S/P left TKA 10/09/2020   S/P total knee arthroplasty, left 10/09/2020   Acute blood loss anemia 03/31/2013   Osteoarthritis of right knee 03/30/2013   PCP:  [name], MD Pharmacy:   [facility] [place] [place] [address] [place] [address] Phone: [phone] Fax: [place], [address] AT [facility] [address] [place] [address] [place] [address] Phone: [phone] Fax: [phone]     Social Determinants of Health  (SDOH) Interventions    Readmission Risk Interventions     No data to display

## 2022-01-21 NOTE — Evaluation (Signed)
Physical Therapy Evaluation Patient Details Name: Chad [name] MRN: [id number] DOB: [birth year] Today's Date: 01/21/2022  History of Present Illness  88 yo male presenting from inpatient rehab with respiratory distress on 6/14. Xray showing possible edema vs infiltrates. Intubated 6/14-6/19; self extubated. Recent admission 5/29-6/1 with CVA and underwent balloon angioplasty of ICA. Found to have severe anemia with GI bleeding. MRI on 6/18 showing 1) acute/subacute nonhemorrhagic infarct involving the right frontal operculum, right middle frontal gyrus, left frontal lobe and right  occipital lobe, 2) Remote lacunar infarcts of the basal ganglia and thalami bilaterally, and 3) Remote lacunar infarcts in the cerebellum. PMH including hypertension, hyperlipidemia, hearing loss.  Clinical Impression  Pt admitted with/for respiratory distress from AIR.  Pt more lethargic, with decreased awareness and decreased movement bil L worse that R LE's.  Pt generally needing total assist for basic mobility and sitting balance. .  Pt currently limited functionally due to the problems listed. ( See problems list.)   Pt will benefit from PT to maximize function and safety in order to get ready for next venue listed below.        Recommendations for follow up therapy are one component of a multi-disciplinary discharge planning process, led by the attending physician.  Recommendations may be updated based on patient status, additional functional criteria and insurance authorization.  Follow Up Recommendations Skilled nursing-short term rehab (<3 hours/day) Can patient physically be transported by private vehicle: No    Assistance Recommended at Discharge Frequent or constant Supervision/Assistance  Patient can return home with the following  A lot of help with walking and/or transfers;A lot of help with bathing/dressing/bathroom;Assistance with cooking/housework;Assist for transportation    Equipment  Recommendations Other (comment)  Recommendations for Other Services       Functional Status Assessment Patient has had a recent decline in their functional status and demonstrates the ability to make significant improvements in function in a reasonable and predictable amount of time.     Precautions / Restrictions Precautions Precautions: Fall Restrictions Weight Bearing Restrictions: No      Mobility  Bed Mobility Overal bed mobility: Needs Assistance Bed Mobility: Rolling, Sidelying to Sit, Sit to Sidelying Rolling: Total assist Sidelying to sit: Total assist     Sit to sidelying: Total assist General bed mobility comments: Total A for managing trunk and BLEs    Transfers                   General transfer comment: Defer    Ambulation/Gait                  Stairs            Wheelchair Mobility    Modified Rankin (Stroke Patients Only) Modified Rankin (Stroke Patients Only) Pre-Morbid Rankin Score: No symptoms Modified Rankin: Severe disability     Balance Overall balance assessment: Needs assistance Sitting-balance support: No upper extremity supported, Feet supported Sitting balance-Leahy Scale: Poor Sitting balance - Comments: pt unable to hold midline and consistently lists L and posteriorly if not supported in midline.  Also needed to inhibit his pushing at times.                                     Pertinent Vitals/Pain Pain Assessment Pain Assessment: Faces Faces Pain Scale: No hurt Pain Descriptors / Indicators: Discomfort, Grimacing Pain Intervention(s): Monitored during session    Home Living  Family/patient expects to be discharged to:: Private residence Living Arrangements: Alone Available Help at Discharge: Family;Available 24 hours/day Type of Home: Apartment Home Access: Level entry;Stairs to enter Entrance Stairs-Rails: None Entrance Stairs-Number of Steps: 1   Home Layout: One level Home  Equipment: Conservation officer, nature (2 wheels);Cane - single point;Toilet riser;BSC/3in1 Additional Comments: Planning for dc to daughter's home (April)    Prior Function Prior Level of Function : Independent/Modified Independent             Mobility Comments: Requiring assistance for practicing transfers ADLs Comments: Requiring assistance for ADLs at rehab     Hand Dominance   Dominant Hand: Right    Extremity/Trunk Assessment   Upper Extremity Assessment Upper Extremity Assessment: Defer to OT evaluation RUE Deficits / Details: Pushing with RUE. increased edema. Able to bring to face with MIn A for support to wash face RUE Coordination: decreased fine motor;decreased gross motor LUE Deficits / Details: No active movement. edema. LUE Coordination: decreased gross motor;decreased fine motor    Lower Extremity Assessment Lower Extremity Assessment: RLE deficits/detail;LLE deficits/detail RLE Deficits / Details: R LE assist through ROM with graded resistance at 4-/5, asisted LLE Deficits / Details: grossly 2+/5 with gross movement in extension, needed significant assist to move into flexion. LLE Coordination: decreased fine motor    Cervical / Trunk Assessment Cervical / Trunk Assessment: Kyphotic  Communication   Communication: HOH  Cognition Arousal/Alertness: Awake/alert Behavior During Therapy: Impulsive, Restless Overall Cognitive Status: Impaired/Different from baseline Area of Impairment: Attention, Memory, Following commands, Safety/judgement, Awareness, Problem solving                   Current Attention Level: Focused, Sustained Memory: Decreased short-term memory Following Commands: Follows one step commands inconsistently, Follows one step commands with increased time Safety/Judgement: Decreased awareness of safety, Decreased awareness of deficits Awareness: Intellectual Problem Solving: Slow processing, Decreased initiation, Difficulty sequencing, Requires  verbal cues General Comments: Pt verbalizing two statement "water" and "im ok". able to sustain attention for washing face but overall very short attention. Requiring increased cues throughout.        General Comments General comments (skin integrity, edema, etc.): vss on 2L    Exercises     Assessment/Plan    PT Assessment Patient needs continued PT services  PT Problem List Decreased strength;Decreased balance;Decreased mobility;Decreased cognition;Decreased knowledge of use of DME;Decreased safety awareness;Decreased knowledge of precautions;Decreased activity tolerance       PT Treatment Interventions Gait training;DME instruction;Functional mobility training;Stair training;Therapeutic activities;Balance training;Therapeutic exercise;Patient/family education    PT Goals (Current goals can be found in the Care Plan section)  Acute Rehab PT Goals Patient Stated Goal: to get stronger from last evaluation, pt unable today PT Goal Formulation: Patient unable to participate in goal setting Time For Goal Achievement: 02/04/22 Potential to Achieve Goals: Good    Frequency Min 3X/week     Co-evaluation PT/OT/SLP Co-Evaluation/Treatment: Yes Reason for Co-Treatment: Complexity of the patient's impairments (multi-system involvement) PT goals addressed during session: Mobility/safety with mobility OT goals addressed during session: ADL's and self-care       AM-PAC PT "6 Clicks" Mobility  Outcome Measure Help needed turning from your back to your side while in a flat bed without using bedrails?: A Lot Help needed moving from lying on your back to sitting on the side of a flat bed without using bedrails?: Total Help needed moving to and from a bed to a chair (including a wheelchair)?: Total Help needed standing up from  a chair using your arms (e.g., wheelchair or bedside chair)?: Total Help needed to walk in hospital room?: Total Help needed climbing 3-5 steps with a railing? :  Total 6 Click Score: 7    End of Session   Activity Tolerance: Patient tolerated treatment well;Patient limited by fatigue Patient left: in bed;with call bell/phone within reach;with bed alarm set;Other (comment) (in sidelying positioned with pillows) Nurse Communication: Mobility status PT Visit Diagnosis: Other abnormalities of gait and mobility (R26.89);Unsteadiness on feet (R26.81);Muscle weakness (generalized) (M62.81);Other symptoms and signs involving the nervous system (R29.898)    Time: 2883-3744 PT Time Calculation (min) (ACUTE ONLY): 37 min   Charges:   PT Evaluation $PT Eval Moderate Complexity: 1 Mod          01/21/2022  Ginger Carne., PT Acute Rehabilitation Services [phone]  (pager) [phone]  (office)  Tessie Fass Teona Vargus 01/21/2022, 4:20 PM

## 2022-01-22 ENCOUNTER — Inpatient Hospital Stay (HOSPITAL_COMMUNITY): Payer: Medicare Other

## 2022-01-22 DIAGNOSIS — Z66 Do not resuscitate: Secondary | ICD-10-CM

## 2022-01-22 LAB — BASIC METABOLIC PANEL
Anion gap: 15 (ref 5–15)
BUN: 56 mg/dL — ABNORMAL HIGH (ref 8–23)
CO2: 27 mmol/L (ref 22–32)
Calcium: 8.5 mg/dL — ABNORMAL LOW (ref 8.9–10.3)
Chloride: 114 mmol/L — ABNORMAL HIGH (ref 98–111)
Creatinine, Ser: 2.67 mg/dL — ABNORMAL HIGH (ref 0.61–1.24)
GFR, Estimated: 22 mL/min — ABNORMAL LOW (ref >60)
Glucose, Bld: 115 mg/dL — ABNORMAL HIGH (ref 70–99)
Potassium: 3.6 mmol/L (ref 3.5–5.1)
Sodium: 156 mmol/L — ABNORMAL HIGH (ref 135–145)

## 2022-01-22 LAB — GLUCOSE, CAPILLARY
Glucose-Capillary: 105 mg/dL — ABNORMAL HIGH (ref 70–99)
Glucose-Capillary: 114 mg/dL — ABNORMAL HIGH (ref 70–99)
Glucose-Capillary: 136 mg/dL — ABNORMAL HIGH (ref 70–99)
Glucose-Capillary: 118 mg/dL — ABNORMAL HIGH (ref 70–99)
Glucose-Capillary: 119 mg/dL — ABNORMAL HIGH (ref 70–99)

## 2022-01-22 LAB — CBC
HCT: 29.3 % — ABNORMAL LOW (ref 39.0–52.0)
Hemoglobin: 9.4 g/dL — ABNORMAL LOW (ref 13.0–17.0)
MCH: 28.4 pg (ref 26.0–34.0)
MCHC: 32.1 g/dL (ref 30.0–36.0)
MCV: 88.5 fL (ref 80.0–100.0)
Platelets: 243 10*3/uL (ref 150–400)
RBC: 3.31 MIL/uL — ABNORMAL LOW (ref 4.22–5.81)
RDW: 17 % — ABNORMAL HIGH (ref 11.5–15.5)
WBC: 7.3 10*3/uL (ref 4.0–10.5)
nRBC: 0 % (ref 0.0–0.2)

## 2022-01-22 LAB — MAGNESIUM: Magnesium: 1.9 mg/dL (ref 1.7–2.4)

## 2022-01-22 MED ORDER — FREE WATER
300.0000 mL | Status: DC
  Administered 2022-01-22 – 2022-01-26 (×24): 300 mL

## 2022-01-22 MED ORDER — DEXTROSE 5 % IV SOLN: INTRAVENOUS | Status: AC

## 2022-01-22 MED ORDER — ATENOLOL 25 MG PO TABS
100.0000 mg | ORAL_TABLET | Freq: Every morning | ORAL | Status: DC
  Administered 2022-01-22: 100 mg via ORAL
  Filled 2022-01-22: qty 1
  Filled 2022-01-22: qty 4

## 2022-01-22 NOTE — Progress Notes (Addendum)
NAME:  Chad [name], MRN:  [id number], DOB:  1931-12-24, LOS: 7 ADMISSION DATE:  01/27/2022, CONSULTATION DATE:  6/14 REFERRING MD:  Sloan Leiter, CHIEF COMPLAINT:  Dyspnea after aspiration   History of Present Illness:  88 y/o male admitted for stroke on 5/28, had angioplasty of R ICA, then moved to rehab on 6/1.  At rehab had recurrent aspiration.  Admitted to ICU on 6/14 in setting of aspiration of tube feeds leading to acute respiratory failure with hypoxemia.    Pertinent  Medical History  Arthritis Heart murmur Hypertension Stroke  Significant Hospital Events: Including procedures, antibiotic start and stop dates in addition to other pertinent events   6/14 Admit from CIR, intubated upon arrival to ICU, concern for GI bleed, shock 6/15 convulsive movements of R arm, started LTM EEG, head CT findings worrisome for new stroke 6/16 EGD >> gastric polyps noted, erythema of gastric antrum, no ulcer, food in esophagus and stomach 6/17 palliative care and pulmonary spoke to patient's family>  6/18 MRI Brain >> acute/subacute non-hemorrhagic infarct involving the right frontal operculum, right middle frontal gyrus, left frontal lobe and right occipital lobe, remote lacunar infarcts of the basal ganglia & thalami bilaterally & in cerebellum, atrophy and white matter disease moderately advanced for age 16/18 MRA Brain >> signal loss at the site of the previous right superior division M2 occlusion, flow is distal to signal loss.  This likely represents a residual high grade stenosis, otherwise normal MRA circle of Willis 6/19 Reports of vomiting yesterday, none overnight. TF restarted 0400.  Self extubated 6/19.  GOC discussed, no plans for reintubation, DNR, comfort care if decompensates 6/20: Had a large melanotic stool.  Hemoglobin remained stable. 6/21: Na 156>>FW increased. Resumed atenolol for rate control. Tx orders placed.   Interim History / Subjective:   No significant overnight  events Sodium increased from 150 to 156 since yesterday.  1800 cc of urine output yesterday. Renal function stable from yesterday. Hemoglobin stable. Chest x-ray stable  Objective   Blood pressure (!) 166/84, pulse (!) 111, temperature 98.5 F (36.9 C), temperature source Oral, resp. rate 19, weight 62 kg, SpO2 100 %.    FiO2 (%):  [100 %] 100 %   Intake/Output Summary (Last 24 hours) at 01/22/2022 2979 Last data filed at 01/22/2022 0754 Gross per 24 hour  Intake 619.72 ml  Output 2255 ml  Net -1635.28 ml    Filed Weights   01/18/22 0500 01/19/22 0408 01/21/22 0500  Weight: 66.8 kg 68.8 kg 62 kg    Examination: General: Chronically ill-appearing male resting comfortably in bed, cachectic appearing HEENT: Dry mucous membranes Cardiac: Tachycardic rate, regular rhythm Pulm: Lung sounds are clear GI: Abdomen soft, nondistended, bowel sounds present MSK: Cachectic Neuro: Lethargic, protecting airway, intermittently following commands   Resolved Hospital Problem list   Aspiration pneumonia  Assessment & Plan:   Acute respiratory failure with hypoxemia due to aspiration pneumonia, NOS Aspiration PNA  -Self extubated 6/19, tolerating, protecting airway, adequate oxygenation -Continue to push pulmonary hygiene -Completed 5-day course of Unasyn 6/18 -Wean oxygen as able  AKI on CKD IIIa due to ATN Hypernatremia Anasarca 6/17 Bladder Outlet Obstruction -Free water deficit approximately 4 L.  Increase free water to 300 cc every 4 hours - Electrolytes stable.  Continue to monitor daily BMPs, strict I's and O's  GI Bleed  Large melanotic stool on 6/14), no clear etiology defined by EGD on 6/16; no ongoing bleeding 6/17 after starting ASA, brillinta for new stroke.  Required transfusion 1 unit PRBC 6/19.  Had another melanotic stool 6/20, hemoglobin remained stable. -Remains on Brilinta, ASA 81, prophylactic heparin -Continue to follow CBC, transfusion goal hemoglobin  7.0 -PPI as ordered  Acute CVA with left hemiplegia, new stroke on R noted on CT head 6/15 Convulsions 6/15, no further activity noted, LTM EEG negative for seizure Carotid Stenosis  -Appreciate neurology management, following neuro exam -Continue aspirin, Brilinta, statin -Careful with sedating medications - Continue tube feed  Chronic HFpEF -Lasix currently on hold, dosing daily depending on renal function, volume status -Following strict I's/O -Blood pressure control  DM2 Glucose range 102-113 -Continue sliding scale insulin and CBG monitoring as per protocol  Goals of care 6/17 Update: Multiple daughters were updated at bedside.  A daughter from [place] arrived and had questions which were answered.  In general the family seemed focused on his comfort and quality of life and were not interested in doing procedures that may cause increased pain or suffering with little change in his long term prognosis. 6/19 IPAL Mtg by Dr Lake Bells > established DNR status, full medical care but would not want MV, CPR, HD if declines  Best Practice (right click and "Reselect all SmartList Selections" daily)  Diet/type: NPO DVT prophylaxis: prophylactic heparin  GI prophylaxis: PPI Lines: N/A Foley:  N/A Code Status:  DNR Last date of multidisciplinary goals of care discussion: IPAL on 6/19  Mitzi Hansen, MD Internal Medicine Resident PGY-3 [facility] 01/22/2022 10:16 AM

## 2022-01-22 NOTE — Progress Notes (Signed)
STROKE TEAM PROGRESS NOTE   SUBJECTIVE (INTERVAL HISTORY) Patient is lying in bed.  Nurses are changing his Foley catheter.  Neurological exam is unchanged.  Speech is dysarthric.  He follows some simple commands.  He remains plegic on the left but is able to move right side purposefully against gravity.  Vital signs stable.  Patient was unable to process speech swallow eval and the tube OBJECTIVE Temp:  [98.2 F (36.8 C)-99.7 F (37.6 C)] 98.2 F (36.8 C) (06/21 1230) Pulse Rate:  [89-116] 111 (06/21 0603) Cardiac Rhythm: Atrial fibrillation (06/21 0800) Resp:  [19-34] 19 (06/21 0603) BP: (123-182)/(58-96) 166/84 (06/21 0830) SpO2:  [100 %] 100 % (06/21 0603) FiO2 (%):  [100 %] 100 % (06/21 0400)  Recent Labs  Lab 01/21/22 1924 01/21/22 2342 01/22/22 0342 01/22/22 0712 01/22/22 1228  GLUCAP 143* 105* 105* 114* 136*   Recent Labs  Lab 01/18/22 0429 01/18/22 1646 01/19/22 0558 01/19/22 1750 01/20/22 0154 01/21/22 0251 01/22/22 0339  NA 139  --  145  --  150* 150* 156*  K 3.7  --  3.6  --  4.0 3.2* 3.6  CL 104  --  107  --  109 109 114*  CO2 21*  --  23  --  25 27 27   GLUCOSE 153*  --  98  --  121* 112* 115*  BUN 57*  --  56*  --  61* 58* 56*  CREATININE 3.08*  --  2.95*  --  3.06* 2.91* 2.67*  CALCIUM 7.7*  --  7.9*  --  7.7* 8.0* 8.5*  MG 2.3 2.1 2.1 2.0 2.0  --  1.9  PHOS 5.0* 4.5 4.8* 4.4 4.3  --   --    Recent Labs  Lab 01/16/22 0300 01/11/2022 0339 01/18/22 0429 01/21/22 0251  AST 22 27 27 23   ALT 13 15 16 15   ALKPHOS 53 66 86 63  BILITOT 0.5 0.7 0.6 1.0  PROT 5.4* 5.7* 6.1* 6.6  ALBUMIN 1.8* 2.0* 2.0* 2.1*   Recent Labs  Lab 01/16/22 0300 01/29/2022 0339 01/18/22 0429 01/19/22 0558 01/21/22 0251 01/21/22 1558 01/22/22 0339  WBC 18.4*   < > 8.9 10.5 6.3 6.7 7.3  NEUTROABS 11.1*  --   --   --   --   --   --   HGB 9.7*   < > 8.7* 9.5* 8.4* 9.3* 9.4*  HCT 27.5*   < > 26.0* 28.5* 25.8* 29.5* 29.3*  MCV 88.7   < > 87.2 86.9 89.9 90.8 88.5  PLT 212   <  > 214 248 227 268 243   < > = values in this interval not displayed.   No results for input(s): "CKTOTAL", "CKMB", "CKMBINDEX", "TROPONINI" in the last 168 hours. No results for input(s): "LABPROT", "INR" in the last 72 hours. No results for input(s): "COLORURINE", "LABSPEC", "PHURINE", "GLUCOSEU", "HGBUR", "BILIRUBINUR", "KETONESUR", "PROTEINUR", "UROBILINOGEN", "NITRITE", "LEUKOCYTESUR" in the last 72 hours.  Invalid input(s): "APPERANCEUR"      Component Value Date/Time   CHOL 149 12/30/2021 0412   TRIG 68 12/30/2021 0412   HDL 45 12/30/2021 0412   CHOLHDL 3.3 12/30/2021 0412   VLDL 14 12/30/2021 0412   LDLCALC 90 12/30/2021 0412   Lab Results  Component Value Date   HGBA1C 6.1 (H) 12/29/2021   No results found for: "LABOPIA", "COCAINSCRNUR", "LABBENZ", "AMPHETMU", "THCU", "LABBARB"  No results for input(s): "ETH" in the last 168 hours.  I have personally reviewed the radiological images below and  agree with the radiology interpretations.  DG CHEST PORT 1 VIEW  Result Date: 01/22/2022 CLINICAL DATA:  Shortness of breath. EXAM: PORTABLE CHEST 1 VIEW COMPARISON:  January 15, 2022. FINDINGS: Feeding tube courses through in off the field of the radiograph tip in the upper abdomen. EKG leads project over the patient's chest.  Post extubation. Cardiomediastinal contours and hilar structures are stable. No signs of lobar consolidation. Improved appearance of airspace component of interstitial and airspace opacity seen on the prior study. No pneumothorax. On limited assessment no acute skeletal finding. IMPRESSION: Post extubation. Improved appearance of interstitial and airspace opacities since the prior study. Still with areas of patchy opacity in the RIGHT mid and LEFT mid chest in particular. No signs of lobar consolidation or gross effusion. Electronically Signed   By: Zetta Bills M.D.   On: 01/22/2022 07:24   VAS US CAROTID  Result Date: 01/20/2022 Carotid Arterial Duplex Study  Patient Name:  ARTIS BEGGS  Date of Exam:   01/19/2022 Medical Rec #: [id number]          Accession #:    [id number] Date of Birth: 08/03/1932           Patient Gender: M Patient Age:   88 years Exam Location:  [facility] Procedure:      VAS US CAROTID Referring Phys: Cornelius Moras XU --------------------------------------------------------------------------------  Indications:       CVA. Risk Factors:      Hypertension, hyperlipidemia. Limitations        Today's exam was limited due to the patient's inability or                    unwillingness to cooperate, the patient's respiratory                    variation, patient on a ventilator and patient movement,                    patient positioning. Comparison Study:  No prior studies. Performing Technologist: Oliver Hum RVT  Examination Guidelines: A complete evaluation includes B-mode imaging, spectral Doppler, color Doppler, and power Doppler as needed of all accessible portions of each vessel. Bilateral testing is considered an integral part of a complete examination. Limited examinations for reoccurring indications may be performed as noted.  Right Carotid Findings: +----------+--------+--------+--------+--------------------------+--------+           PSV cm/sEDV cm/sStenosisPlaque Description        Comments +----------+--------+--------+--------+--------------------------+--------+ CCA Prox  53      9               irregular and heterogenous         +----------+--------+--------+--------+--------------------------+--------+ CCA Distal61      8               irregular and heterogenous         +----------+--------+--------+--------+--------------------------+--------+ ICA Prox  77      9               calcific and heterogenous          +----------+--------+--------+--------+--------------------------+--------+ ICA Distal57      12                                        tortuous  +----------+--------+--------+--------+--------------------------+--------+ ECA       83      7                                                  +----------+--------+--------+--------+--------------------------+--------+ +----------+--------+-------+--------+-------------------+  PSV cm/sEDV cmsDescribeArm Pressure (mmHG) +----------+--------+-------+--------+-------------------+ [id number]                                         +----------+--------+-------+--------+-------------------+ +---------+--------+--+--------+-+----------------------------+ VertebralPSV cm/s67EDV cm/s5Antegrade and High resistant +---------+--------+--+--------+-+----------------------------+  Left Carotid Findings: Unable to obtain diagnostic images of the left carotid system due to patient movement, and poor positioning.  Summary: Right Carotid: Velocities in the right ICA are consistent with a 1-39% stenosis. Vertebrals: Right vertebral artery demonstrates high resistant flow. *See table(s) above for measurements and observations.  Electronically signed by Jamelle Haring on 01/20/2022 at 11:43:21 AM.    Final    MR ANGIO HEAD WO CONTRAST  Result Date: 01/19/2022 CLINICAL DATA:  Stroke, follow-up. Right MCA infarct. Additional punctate infarcts involving the right occipital lobe and left frontal lobe. EXAM: MRA HEAD WITHOUT CONTRAST TECHNIQUE: Angiographic images of the Circle of Willis were acquired using MRA technique without intravenous contrast. COMPARISON:  MRA head 12/30/2021.  CTA head 12/30/2021 FINDINGS: Anterior circulation: Focal signal loss is present at the site of the previous right superior division M2 occlusion. Flow signal is now present distal to this region. The inferior M2 segment is within normal limits. Left MCA bifurcations are normal. Mild irregularity is present the cavernous internal carotid artery without a significant ICA stenosis. A1 and M1 segments are normal. The MCA  bifurcations are intact. ACA branch vessels are normal. Posterior circulation: Left vertebral artery is the dominant vessel. The right vertebral artery is hypoplastic common bifurcating at the PICA. The vertebrobasilar junction and basilar artery are normal. Both posterior cerebral arteries originate from basilar tip. Left posterior communicating artery is patent. Anatomic variants: None Other: None. IMPRESSION: 1. Signal loss at the site of the previous right superior division M2 occlusion. Flow is present distal to signal loss. This likely represents a residual high-grade stenosis. 2. Otherwise normal MRA circle-of-Willis. Electronically Signed   By: San Morelle M.D.   On: 01/19/2022 18:55   MR BRAIN WO CONTRAST  Result Date: 01/19/2022 CLINICAL DATA:  Mental status change.  Stroke, follow-up. EXAM: MRI HEAD WITHOUT CONTRAST TECHNIQUE: Multiplanar, multiecho pulse sequences of the brain and surrounding structures were obtained without intravenous contrast. COMPARISON:  CT head without contrast 01/16/2022. MR head without contrast 12/30/2021. FINDINGS: Brain: Restricted diffusion is present in the right frontal operculum consistent with acute/subacute infarct. This extends superiorly the perirolandic cortex on the right. Involvement of the right middle frontal gyrus also noted. A punctate nonhemorrhagic infarct is present in the left frontal lobe punctate cortical infarct is present in the right occipital lobe. The infarct involves the superior aspect of the right insula. T2 and FLAIR hyperintensities are associated with the areas of acute infarction. Confluent periventricular and subcortical T2 hyperintensities are present otherwise. Remote lacunar infarcts are present the basal ganglia bilaterally. Remote lacunar infarcts are present in the thalami. White matter changes extend into the brainstem. Remote lacunar infarcts of the cerebellum are stable. The ventricles are proportionate to the degree of  atrophy. No significant extraaxial fluid collection is present. The internal auditory canals are within normal limits. Vascular: Flow is present in the major intracranial arteries. Skull and upper cervical spine: Degenerative changes are present in the upper cervical spine with narrowing at C3-4. The craniocervical junction is normal. Marrow signal is normal. Sinuses/Orbits: Bilateral mastoid effusions are present. Fluid is present the nasopharynx. This is sec scratched at this is likely  secondary to intubation. The paranasal sinuses and mastoid air cells are otherwise clear. Bilateral lens replacements are noted. Globes and orbits are otherwise unremarkable. IMPRESSION: 1. Acute/subacute nonhemorrhagic infarct involving the right frontal operculum, right middle frontal gyrus, left frontal lobe and right occipital lobe. 2. Remote lacunar infarcts of the basal ganglia and thalami bilaterally. 3. Remote lacunar infarcts in the cerebellum. 4. Atrophy and white matter disease is moderately advanced for age. This likely reflects the sequela of chronic microvascular ischemia. Electronically Signed   By: San Morelle M.D.   On: 01/19/2022 18:49   DG Abd Portable 1V  Result Date: 01/19/2022 CLINICAL DATA:  Nausea and vomiting. EXAM: PORTABLE ABDOMEN - 1 VIEW COMPARISON:  None Available. FINDINGS: Small bore feeding tube terminates in the distal stomach. Bowel gas pattern is unremarkable. IMPRESSION: Small bore feeding tube terminates in the distal stomach. Electronically Signed   By: San Morelle M.D.   On: 01/19/2022 14:53   DG Abd Portable 1V  Result Date: 01/05/2022 CLINICAL DATA:  Feeding tube EXAM: PORTABLE ABDOMEN - 1 VIEW COMPARISON:  None Available. FINDINGS: Enteric tube is present with tip overlying the distal stomach in the pyloric region. There is some contrast within the fundus of the stomach and colon. IMPRESSION: Enteric tube tip overlies the distal stomach. Electronically Signed   By:  Macy Mis M.D.   On: 01/06/2022 10:51   Overnight EEG with video  Result Date: 01/27/2022 Lora Havens, MD     01/18/2022  1:32 PM Patient Name: LUIZ TRUMPOWER MRN: [id number] Epilepsy Attending: Lora Havens Referring Physician/Provider: Gwinda Maine, MD Duration: 01/16/2022 1240 to 01/20/2022  1240  Patient history: 88 year old male with history of stroke had an episode of posturing right arm and left gaze.  EEG to evaluate for seizure.  Level of alertness:  lethargic  AEDs during EEG study: None  Technical aspects: This EEG study was done with scalp electrodes positioned according to the 10-20 International system of electrode placement. Electrical activity was acquired at a sampling rate of 500Hz  and reviewed with a high frequency filter of 70Hz  and a low frequency filter of 1Hz . EEG data were recorded continuously and digitally stored.  Description: EEG showed continuous generalized and and lateralized right hemisphere 3- 6 Hz theta-delta slowing. Hyperventilation and photic stimulation were not performed.    ABNORMALITY - Continuous slow, generalized and lateralized right hemisphere  IMPRESSION: This study is suggestive of cortical dysfunction arising from right hemisphere which is likely secondary to underlying strokes. Additionally there is moderate to severe diffuse encephalopathy, nonspecific etiology. No seizures or epileptiform discharges were seen throughout the recording.  Lora Havens    EEG adult  Result Date: 01/16/2022 Lora Havens, MD     01/16/2022 12:45 PM Patient Name: MARSHAUN LORTIE MRN: [id number] Epilepsy Attending: Lora Havens Referring Physician/Provider: Corey Harold, NP Date: 01/16/2022 Duration: 21.43 mins Patient history: 88 year old male with history of stroke had an episode of posturing right arm and left gaze.  EEG to evaluate for seizure. Level of alertness:  lethargic AEDs during EEG study: Ativan Technical aspects: This EEG study was  done with scalp electrodes positioned according to the 10-20 International system of electrode placement. Electrical activity was acquired at a sampling rate of 500Hz  and reviewed with a high frequency filter of 70Hz  and a low frequency filter of 1Hz . EEG data were recorded continuously and digitally stored. Description: EEG showed continuous generalized and maximal left frontal 3- 6 Hz theta-delta  slowing which at times appears sharply contoured. Hyperventilation and photic stimulation were not performed.   ABNORMALITY - Continuous slow, generalized IMPRESSION: This study is suggestive of moderate to severe diffuse encephalopathy, nonspecific etiology. No seizures or epileptiform discharges were seen throughout the recording. Priyanka Barbra Sarks   CT HEAD WO CONTRAST (5MM)  Result Date: 01/16/2022 CLINICAL DATA:  Mental status change, unknown cause. EXAM: CT HEAD WITHOUT CONTRAST TECHNIQUE: Contiguous axial images were obtained from the base of the skull through the vertex without intravenous contrast. RADIATION DOSE REDUCTION: This exam was performed according to the departmental dose-optimization program which includes automated exposure control, adjustment of the mA and/or kV according to patient size and/or use of iterative reconstruction technique. COMPARISON:  CT angiogram head/neck 12/30/2021. Brain MRI 12/30/2021. FINDINGS: Brain: Moderate cerebral atrophy. New from the prior head CT of 12/30/2021, there is a small to moderate-sized acute/subacute cortical and subcortical infarct within the right frontoparietal lobes and right insula (right MCA vascular territory). Additional known small subacute infarcts within the right MCA vascular territory were better appreciated on the prior brain MRI of 12/30/2021 (acute at that time). Background advanced patchy and ill-defined hypoattenuation within the cerebral white matter, nonspecific but compatible chronic small vessel ischemic disease. Redemonstrated chronic  lacunar infarcts within the left basal ganglia and thalamus. There is no acute intracranial hemorrhage. No extra-axial fluid collection. No evidence of an intracranial mass. No midline shift. Vascular: Atherosclerotic calcifications. Redemonstrated focus of calcification along an M2 right middle cerebral artery, which may reflect a calcified embolus. Skull: No fracture or aggressive osseous lesion. Sinuses/Orbits: No mass or acute finding within the imaged orbits. Trace mucosal thickening within the bilateral ethmoid sinuses. IMPRESSION: New from the prior head CT of 12/30/2021, there is a small to moderate-sized acute/subacute cortical and subcortical infarct within the right frontoparietal lobes and right insula (right MCA vascular territory). Additional known small subacute infarcts within the right MCA vascular territory were better appreciated on the prior brain MRI of 12/30/2021 (acute at that time). Background advanced chronic small vessel ischemic changes within the cerebral white matter. Redemonstrated chronic lacunar infarcts within the left basal ganglia and left thalamus. Moderate generalized cerebral atrophy. Electronically Signed   By: Kellie Simmering D.O.   On: 01/16/2022 11:28   US RENAL  Result Date: 01/08/2022 CLINICAL DATA:  Renal dysfunction EXAM: RENAL / URINARY TRACT ULTRASOUND COMPLETE COMPARISON:  None Available. FINDINGS: Right Kidney: Renal measurements: 8.9 x 5 x 5 cm = volume: 115.2 mL. There is no hydronephrosis. There is increased cortical echogenicity. Left Kidney: Renal measurements: 10.3 x 6.2 x 5 cm = volume: 165.9 mL. There is no hydronephrosis. There is increased cortical echogenicity. Bladder: Foley catheter is seen in the bladder. Bladder is not adequately distended for evaluation. Other: 1.1 cm splenule is noted adjacent to the inferior margin of spleen. Left pleural effusion is seen. IMPRESSION: There is no hydronephrosis. Increased cortical echogenicity suggests medical  renal disease. Left pleural effusion. Electronically Signed   By: Elmer Picker M.D.   On: 01/14/2022 12:21   Portable Chest x-ray  Result Date: 01/16/2022 CLINICAL DATA:  Intubation EXAM: PORTABLE CHEST 1 VIEW COMPARISON:  01/14/2022, 3:13 a.m. FINDINGS: Interval endotracheal intubation, tip approximately 2 cm above the carina. Enteric feeding tube remains in position, partially imaged. Cardiomegaly. Diffuse bilateral heterogeneous and interstitial airspace opacity, unchanged. IMPRESSION: 1. Interval endotracheal intubation, tip approximately 2 cm above the carina. Enteric feeding tube remains in position, partially imaged. 2. Cardiomegaly. Diffuse bilateral heterogeneous and interstitial airspace  opacity, unchanged. Electronically Signed   By: Delanna Ahmadi M.D.   On: 01/31/2022 10:39   DG Abd 1 View  Result Date: 01/31/2022 CLINICAL DATA:  88 year old male with abdominal pain and vomiting. EXAM: ABDOMEN - 1 VIEW COMPARISON:  01/10/2022. FINDINGS: Portable AP supine views at 0704 hours. Patient is mildly rotated to the left but the enteric tube tip is now in the gastric body. Extensive Calcified aortic atherosclerosis. Pelvic vascular calcifications. Progressive opacification of the visible left lung base, left hemidiaphragm now largely obscured. Decreased large bowel gas and borderline to mildly dilated gas-filled mid abdominal small bowel loops now. Spinal scoliosis and degeneration. No acute osseous abnormality identified. IMPRESSION: 1. Enteric tube tip now in the gastric body. 2. Decreased large bowel gas and borderline to mildly dilated mid abdominal small bowel loops. This could reflect ileus or developing small bowel obstruction. Follow-up abdominal radiographs may be valuable. 3. Progressive opacification at the left lung base since 01/10/2022, see portable chest x-ray 0313 hours today. Electronically Signed   By: Genevie Ann M.D.   On: 01/09/2022 07:37   DG Chest Port 1 View  Result Date:  01/07/2022 CLINICAL DATA:  Acute respiratory distress EXAM: PORTABLE CHEST 1 VIEW COMPARISON:  03/23/2022 FINDINGS: Cardiomegaly. Vascular congestion. Aortic atherosclerosis. Perihilar airspace opacities bilaterally. No effusions. No acute bony abnormality. IMPRESSION: Perihilar airspace opacities, likely edema although pneumonia not excluded. Electronically Signed   By: Rolm Baptise M.D.   On: 01/07/2022 03:29   DG Swallowing Func-Speech Pathology  Result Date: 01/10/2022 Table formatting from the original result was not included. Objective Swallowing Evaluation: Type of Study: MBS-Modified Barium Swallow Study  Patient Details Name: AMAHRI DENGEL MRN: [id number] Date of Birth: 1932/05/11 Today's Date: 01/10/2022 Time: SLP Start Time (ACUTE ONLY): 1100 -SLP Stop Time (ACUTE ONLY): 1123 SLP Time Calculation (min) (ACUTE ONLY): 23 min Past Medical History: Past Medical History: Diagnosis Date  Arthritis   Heart murmur   asa child   Hypertension   Stroke [facility])  Past Surgical History: Past Surgical History: Procedure Laterality Date  EYE SURGERY Bilateral 2022  IR ANGIO INTRA EXTRACRAN SEL COM CAROTID INNOMINATE UNI R MOD SED  01/01/2022  IR CT HEAD LTD  01/01/2022  IR PTA INTRACRANIAL  01/01/2022  JOINT REPLACEMENT    RADIOLOGY WITH ANESTHESIA N/A 01/01/2022  Procedure: Angiogram;  Surgeon: Luanne Bras, MD;  Location: [place];  Service: Radiology;  Laterality: N/A;  TONSILLECTOMY    TOTAL KNEE ARTHROPLASTY Right 03/30/2013  Procedure: RIGHT TOTAL KNEE ARTHROPLASTY;  Surgeon: Tobi Bastos, MD;  Location: [facility];  Service: Orthopedics;  Laterality: Right;  TOTAL KNEE ARTHROPLASTY Left 10/09/2020  Procedure: TOTAL KNEE ARTHROPLASTY;  Surgeon: Paralee Cancel, MD;  Location: [facility];  Service: Orthopedics;  Laterality: Left;  70 mins HPI: 88 yo male presenting to the ED on 5/28 with L sided weakness, facial droop, slurred speech, and fall. MRI showing cluster of small cortical and white matter infarcts within the  right MCA distribution. PMH including hypothyroidism chronic pain syndromes, CKD, HTN, arthritis, bil TKA.  Subjective: alert, friendly  Recommendations for follow up therapy are one component of a multi-disciplinary discharge planning process, led by the attending physician.  Recommendations may be updated based on patient status, additional functional criteria and insurance authorization. Assessment / Plan / Recommendation   01/10/2022   8:50 PM Clinical Impressions Clinical Impressions SLP Visit Diagnosis MBSS completed secondary to clinical concerns for aspiration of current diet textures (puree and nectar-thick liquids). At this time,  pt presents with severe oropharyngeal dysphagia, with significant UES dysfunction, which resulted in significant posterior tracheal sensed aspiration (PAS 7) of pureed texture via interarytenoid space due to overflow of pyriform sinus residuals into laryngeal vestibule. Unfortunately, pharyngeal stasis and pyriform sinus residuals could not clear due to poor UES relaxation with mild esophageal backflow to pyriform sinuses. Additionally, delayed sensation of gross aspiration of nectar-thick liquid via tsp was appreciated during the swallow. [name] cough response was not effective in pulmonary clearance. Pt required tactile, hyoglossal assistance and verbal cues, from SLP, to initiate swallow response during puree trials. It should be noted that this is a significant change from pt's MBSS results documented on 01/02/2022.   Given pt's risk severity for development of aspiration PNA (Total A for oral care, decreased ambulation, deconditioning) and change in swallow function, recommend initiation of NPO with temporary alternate means of nutrition, hydration, and medications at this time. May consider GI referral to assess UES function if improvement is not noted with therapy alone. [name] daughter present and results + recommendations were reviewed with pt an daughter who verbalized agreement  with recommendations following education. MD, LPN, and PA notified of recommendations. Please see imaging report for full details. Dysphagia, oropharyngeal phase (R13.12);Cognitive communication deficit (R41.841) Impact on safety and function Severe aspiration risk;Risk for inadequate nutrition/hydration     01/02/2022  11:00 AM Treatment Recommendations Treatment Recommendations Therapy as outlined in treatment plan below     01/10/2022   1:59 PM Prognosis Prognosis for Safe Diet Advancement Good Barriers to Reach Goals Cognitive deficits;Severity of deficits   01/10/2022   8:50 PM Diet Recommendations SLP Diet Recommendations NPO;Alternative means - temporary Medication Administration Via alternative means Postural Changes Other (Comment)     01/10/2022   8:50 PM Other Recommendations Recommended Consults Consider GI evaluation;Consider esophageal assessment Oral Care Recommendations Oral care QID Other Recommendations Have oral suction available   01/10/2022   8:50 PM Frequency and Duration  Treatment Duration 2 weeks     01/10/2022   1:53 PM Oral Phase Oral Phase Impaired Oral - Nectar Teaspoon Weak lingual manipulation;Lingual pumping;Delayed oral transit;Decreased bolus cohesion;Premature spillage;Lingual/palatal residue;Piecemeal swallowing;Reduced posterior propulsion Oral - Nectar Cup NT Oral - Nectar Straw NT Oral - Thin Cup NT Oral - Thin Straw NT Oral - Puree Weak lingual manipulation;Lingual pumping;Incomplete tongue to palate contact;Lingual/palatal residue;Delayed oral transit;Decreased bolus cohesion;Premature spillage;Holding of bolus;Piecemeal swallowing Oral - Mech Soft NT Oral - Regular NT Oral - Multi-Consistency NT Oral - Pill NT    01/10/2022   1:54 PM Pharyngeal Phase Pharyngeal Phase Impaired Pharyngeal- Nectar Teaspoon Delayed swallow initiation-pyriform sinuses;Reduced pharyngeal peristalsis;Reduced airway/laryngeal closure;Moderate aspiration;Inter-arytenoid space residue;Pharyngeal residue - cp  segment;Pharyngeal residue - posterior pharnyx;Pharyngeal residue - pyriform;Pharyngeal residue - valleculae;Lateral channel residue;Penetration/Aspiration during swallow;Reduced laryngeal elevation Pharyngeal Material enters airway, passes BELOW cords and not ejected out despite cough attempt by patient Pharyngeal- Nectar Cup NT Pharyngeal- Nectar Straw NT Pharyngeal- Thin Cup NT Pharyngeal- Thin Straw NT Pharyngeal- Puree Delayed swallow initiation-pyriform sinuses;Reduced pharyngeal peristalsis;Reduced anterior laryngeal mobility;Reduced tongue base retraction;Penetration/Apiration after swallow;Moderate aspiration;Pharyngeal residue - pyriform;Pharyngeal residue - posterior pharnyx;Pharyngeal residue - cp segment;Inter-arytenoid space residue;Lateral channel residue;Compensatory strategies attempted (with notebox) Pharyngeal Material enters airway, passes BELOW cords and not ejected out despite cough attempt by patient Pharyngeal- Mechanical Soft NT Pharyngeal- Regular NT Pharyngeal- Multi-consistency NT Pharyngeal- Pill NT    01/10/2022   1:58 PM Cervical Esophageal Phase  Cervical Esophageal Phase Impaired Nectar Teaspoon Reduced cricopharyngeal relaxation Nectar Cup NT Nectar Straw NT Puree  Esophageal backflow into the pharynx;Reduced cricopharyngeal relaxation Mechanical Soft NT Regular NT Multi-consistency NT Pill NT Cervical Esophageal Comment Decreased relaxation of the UES observed with significant pyriform sinus residuals Bethany A Lutes 01/10/2022, 9:06 PM                     DG Abd Portable 1V  Result Date: 01/10/2022 CLINICAL DATA:  Feeding tube placement. EXAM: PORTABLE ABDOMEN - 1 VIEW COMPARISON:  None Available. FINDINGS: Feeding tube tip is at the level of the gastric antrum. Small rounded densities are seen in the region of the colon likely related to diverticula. There is a heterogeneous rounded density projecting over the right upper quadrant measuring 3.0 x 3.0 cm. This is indeterminate. Lung  bases are clear. There is mild curvature of the lumbar spine with degenerative change. IMPRESSION: 1. Feeding tube tip is at the level of the gastric antrum. 2. Scattered colonic diverticula. 3. 3 cm heterogeneous density in the right upper quadrant, indeterminate. Findings may represent a large gallstone, renal calcification or other calcified mass. Please correlate clinically. This can be further evaluated with CT. Electronically Signed   By: Ronney Asters M.D.   On: 01/10/2022 15:07   IR ANGIO INTRA EXTRACRAN SEL COM CAROTID INNOMINATE UNI R MOD SED  Result Date: 01/03/2022 CLINICAL DATA:  Left-sided weakness with dysarthria. Patient with calcified embolus in the inferior division right MCA. Discovered to have high-grade stenosis of the extracranial right ICA proximally EXAM: IR ANGIO INTRA EXTRACRAN SEL COM CAROTID INNOMINATE UNI RIGHT MOD SED COMPARISON:  CT angiogram of the head and neck of Dec 30, 2021. MEDICATIONS: Heparin 3,000 units IV. Ancef 2 g IV antibiotic was administered within 1 hour of the procedure. ANESTHESIA/SEDATION: General anesthesia. CONTRAST:  Omnipaque 300 approximately 120 cc. FLUOROSCOPY TIME:  Fluoroscopy Time: 76 minutes 36) seconds (1257 mGy). COMPLICATIONS: None immediate. TECHNIQUE: Informed written consent was obtained from the patient after a thorough discussion of the procedural risks, benefits and alternatives. All questions were addressed. Maximal Sterile Barrier Technique was utilized including caps, mask, sterile gowns, sterile gloves, sterile drape, hand hygiene and skin antiseptic. A timeout was performed prior to the initiation of the procedure. The left groin was prepped and draped in the usual sterile fashion. Thereafter using modified Seldinger technique, transfemoral access into the left common femoral artery was obtained without difficulty. Over a 0.035 inch guidewire, an 8 French 25 cm Pinnacle sheath was inserted. Through this, and also over 0.035 inch guidewire,  a 120 cm 6 French Simmons 2 catheter inside of an 087 95 cm balloon guide catheter combination was advanced to the aortic arch region and selectively positioned in the proximal right common carotid artery and the innominate artery. An arteriogram was then performed centered extra cranially and intracranially. FINDINGS: The innominate arteriogram demonstrates significant tortuosity of the innominate artery in its entirety extending into the right common carotid artery with a 180 degree loop in the proximal right common carotid artery. Subclavian artery demonstrates patency with opacification of a hypoplastic right vertebral artery extra cranially. The right common carotid arteriogram demonstrates complex atherosclerotic calcified plaque at the right common carotid bifurcation extending into the bulb region of the right internal carotid artery resulting in a high-grade approximately 80-90% stenosis. More distally, the right internal carotid artery opacifies to the cranial skull base. Patency is seen of the petrous, cavernous and supraclinoid right ICA with a prominent right posterior communicating artery. Proximal portions of the right middle cerebral artery and the right  anterior cerebral artery demonstrate patency. ENDOVASCULAR REVASCULARIZATION OF HIGH-GRADE STENOSIS OF THE RIGHT INTERNAL CAROTID ARTERY PROXIMALLY WITH BALLOON ANGIOPLASTY The balloon guide catheter was advanced into the proximal right common carotid artery. Through this, and also over a 0.014 inch Transend EX soft tip micro guidewire, an 021 150 cm microcatheter was then advanced with biplane DSA roadmap to the proximal cavernous right ICA followed by the microcatheter. The micro guidewire was removed. Good aspiration obtained from the hub of the microcatheter. A gentle control arteriogram performed through the microcatheter demonstrated safe positioning of the tip of the microcatheter. This in turn was then exchanged out for a 300 cm 014 inch BMW  exchange wire with a moderate J configuration. The tip of the micro guidewire was positioned at the petrous cavernous junction. Measurements were then performed of the right internal carotid artery distal to the stenosis, and just proximal to the stenosis of the distal common carotid artery. A 5 mm x 20 mm 014 inch Viatrac balloon guide catheter was then prepped and purged retrogradely with heparinized saline infusion, and retrogradely with 50% contrast and 50% heparinized saline infusion. Using the rapid exchange technique, the balloon catheter was then advanced without difficulty and positioned with the distal and proximal markers adequate distant from the site of the severe stenosis in the proximal right internal carotid artery. Slow control inflation was then performed using micro inflation syringe device via micro tubing. Inflations were performed slowly to 9 atmospheres where it was maintained for approximately 60 seconds. Thereafter, the balloon was deflated and retrieved and removed. A control arteriogram performed through the balloon guide catheter in the proximal right common carotid artery demonstrated significantly improved caliber and flow through the angioplastied segment. More distally, free flow was noted into the distal right ICA intracranially. Over the exchange BMW micro guidewire, a 8 mm x 26 mm Wallstent stent delivery system which had been prepped with heparinized saline infusion was then advanced again using the rapid exchange technique to the proximal right common carotid artery. Resistance was encountered to further advancement of the stent delivery apparatus. At this time the balloon guide catheter was advanced to the distal right common carotid artery. Further attempts were made to advance the stent delivery apparatus into the right internal carotid proximally. Advancement could only manage just distal to the angioplastied segment with no significant coverage of the angioplastied segment.  Further attempts at advancing were met with resistance secondary to a step-off related to the calcified plaque. After multiple attempts it was decided to stop the procedure. Final control arteriogram performed through the balloon guide catheter in the right common carotid artery continued to demonstrate excellent flow through the right external carotid artery with a mild-to-moderate stenosis at its origin. The right internal carotid artery proximally demonstrated approximately 60% patency which was maintained on subsequent 20 minute arteriogram. Centered intracranially demonstrated brisk flow into the right anterior cerebral artery and the right middle cerebral distribution. The previously occluded anterior branch of the inferior division proximally due to a calcified plaque remained stable. The delayed arterial phase demonstrated retrograde opacification of the posterior 2/3 of the cortical branches in the sylvian triangle from the branches of the inferior division and the superior division, and also from the pericallosal branches. The balloon guide catheter was removed. The 8 French Pinnacle sheath in the left common femoral artery was removed with manual compression held for approximately 25 minutes with a quick clot to achieved hemostasis at this site. Distal pulses remained palpable in the dorsalis  pedis arteries bilaterally unchanged. An immediate CT of the brain demonstrated no evidence of hemorrhagic complications. The patient was then gradually extubated without difficulty. Patient was able to maintain his airway, and oxygen concentrations. He was able to move his right side spontaneously and to command. The patient was able to follow simple commands appropriately. No significant motor movement was evident in the left upper extremity, with only a flicker in the left foot toes. Patient was then transferred to the PACU and then subsequently the neuro ICU for post revascularization care. Later in the  afternoon, the patient appeared more awake, alert and appropriately responsive. Had mild intermittent right gaze deviation but was able to track his eyes to the left past the midline. Otherwise, neurologically he remained stable unchanged. 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 PLAN: Follow-up in the clinic 4 to 6 weeks post discharge. Electronically Signed   By: Luanne Bras M.D.   On: 01/03/2022 08:12   IR CT Head Ltd  Result Date: 01/03/2022 CLINICAL DATA:  Left-sided weakness with dysarthria. Patient with calcified embolus in the inferior division right MCA. Discovered to have high-grade stenosis of the extracranial right ICA proximally EXAM: IR ANGIO INTRA EXTRACRAN SEL COM CAROTID INNOMINATE UNI RIGHT MOD SED COMPARISON:  CT angiogram of the head and neck of Dec 30, 2021. MEDICATIONS: Heparin 3,000 units IV. Ancef 2 g IV antibiotic was administered within 1 hour of the procedure. ANESTHESIA/SEDATION: General anesthesia. CONTRAST:  Omnipaque 300 approximately 120 cc. FLUOROSCOPY TIME:  Fluoroscopy Time: 76 minutes 36) seconds (1257 mGy). COMPLICATIONS: None immediate. TECHNIQUE: Informed written consent was obtained from the patient after a thorough discussion of the procedural risks, benefits and alternatives. All questions were addressed. Maximal Sterile Barrier Technique was utilized including caps, mask, sterile gowns, sterile gloves, sterile drape, hand hygiene and skin antiseptic. A timeout was performed prior to the initiation of the procedure. The left groin was prepped and draped in the usual sterile fashion. Thereafter using modified Seldinger technique, transfemoral access into the left common femoral artery was obtained without difficulty. Over a 0.035 inch guidewire, an 8 French 25 cm Pinnacle sheath was inserted.  Through this, and also over 0.035 inch guidewire, a 120 cm 6 French Simmons 2 catheter inside of an 087 95 cm balloon guide catheter combination was advanced to the aortic arch region and selectively positioned in the proximal right common carotid artery and the innominate artery. An arteriogram was then performed centered extra cranially and intracranially. FINDINGS: The innominate arteriogram demonstrates significant tortuosity of the innominate artery in its entirety extending into the right common carotid artery with a 180 degree loop in the proximal right common carotid artery. Subclavian artery demonstrates patency with opacification of a hypoplastic right vertebral artery extra cranially. The right common carotid arteriogram demonstrates complex atherosclerotic calcified plaque at the right common carotid bifurcation extending into the bulb region of the right internal carotid artery resulting in a high-grade approximately 80-90% stenosis. More distally, the right internal carotid artery opacifies to the cranial skull base. Patency is seen of the petrous, cavernous and supraclinoid right ICA with a prominent right posterior communicating artery. Proximal portions of the right middle cerebral artery and the right anterior cerebral artery demonstrate patency. ENDOVASCULAR REVASCULARIZATION OF HIGH-GRADE STENOSIS OF THE RIGHT INTERNAL CAROTID ARTERY PROXIMALLY WITH BALLOON ANGIOPLASTY The balloon guide catheter was advanced into the proximal  right common carotid artery. Through this, and also over a 0.014 inch Transend EX soft tip micro guidewire, an 021 150 cm microcatheter was then advanced with biplane DSA roadmap to the proximal cavernous right ICA followed by the microcatheter. The micro guidewire was removed. Good aspiration obtained from the hub of the microcatheter. A gentle control arteriogram performed through the microcatheter demonstrated safe positioning of the tip of the microcatheter. This in turn  was then exchanged out for a 300 cm 014 inch BMW exchange wire with a moderate J configuration. The tip of the micro guidewire was positioned at the petrous cavernous junction. Measurements were then performed of the right internal carotid artery distal to the stenosis, and just proximal to the stenosis of the distal common carotid artery. A 5 mm x 20 mm 014 inch Viatrac balloon guide catheter was then prepped and purged retrogradely with heparinized saline infusion, and retrogradely with 50% contrast and 50% heparinized saline infusion. Using the rapid exchange technique, the balloon catheter was then advanced without difficulty and positioned with the distal and proximal markers adequate distant from the site of the severe stenosis in the proximal right internal carotid artery. Slow control inflation was then performed using micro inflation syringe device via micro tubing. Inflations were performed slowly to 9 atmospheres where it was maintained for approximately 60 seconds. Thereafter, the balloon was deflated and retrieved and removed. A control arteriogram performed through the balloon guide catheter in the proximal right common carotid artery demonstrated significantly improved caliber and flow through the angioplastied segment. More distally, free flow was noted into the distal right ICA intracranially. Over the exchange BMW micro guidewire, a 8 mm x 26 mm Wallstent stent delivery system which had been prepped with heparinized saline infusion was then advanced again using the rapid exchange technique to the proximal right common carotid artery. Resistance was encountered to further advancement of the stent delivery apparatus. At this time the balloon guide catheter was advanced to the distal right common carotid artery. Further attempts were made to advance the stent delivery apparatus into the right internal carotid proximally. Advancement could only manage just distal to the angioplastied segment with no  significant coverage of the angioplastied segment. Further attempts at advancing were met with resistance secondary to a step-off related to the calcified plaque. After multiple attempts it was decided to stop the procedure. Final control arteriogram performed through the balloon guide catheter in the right common carotid artery continued to demonstrate excellent flow through the right external carotid artery with a mild-to-moderate stenosis at its origin. The right internal carotid artery proximally demonstrated approximately 60% patency which was maintained on subsequent 20 minute arteriogram. Centered intracranially demonstrated brisk flow into the right anterior cerebral artery and the right middle cerebral distribution. The previously occluded anterior branch of the inferior division proximally due to a calcified plaque remained stable. The delayed arterial phase demonstrated retrograde opacification of the posterior 2/3 of the cortical branches in the sylvian triangle from the branches of the inferior division and the superior division, and also from the pericallosal branches. The balloon guide catheter was removed. The 8 French Pinnacle sheath in the left common femoral artery was removed with manual compression held for approximately 25 minutes with a quick clot to achieved hemostasis at this site. Distal pulses remained palpable in the dorsalis pedis arteries bilaterally unchanged. An immediate CT of the brain demonstrated no evidence of hemorrhagic complications. The patient was then gradually extubated without difficulty. Patient was able to maintain  his airway, and oxygen concentrations. He was able to move his right side spontaneously and to command. The patient was able to follow simple commands appropriately. No significant motor movement was evident in the left upper extremity, with only a flicker in the left foot toes. Patient was then transferred to the PACU and then subsequently the neuro ICU for  post revascularization care. Later in the afternoon, the patient appeared more awake, alert and appropriately responsive. Had mild intermittent right gaze deviation but was able to track his eyes to the left past the midline. Otherwise, neurologically he remained stable unchanged. 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 PLAN: Follow-up in the clinic 4 to 6 weeks post discharge. Electronically Signed   By: Luanne Bras M.D.   On: 01/03/2022 08:12   IR PTA Intracranial  Result Date: 01/03/2022 CLINICAL DATA:  Left-sided weakness with dysarthria. Patient with calcified embolus in the inferior division right MCA. Discovered to have high-grade stenosis of the extracranial right ICA proximally EXAM: IR ANGIO INTRA EXTRACRAN SEL COM CAROTID INNOMINATE UNI RIGHT MOD SED COMPARISON:  CT angiogram of the head and neck of Dec 30, 2021. MEDICATIONS: Heparin 3,000 units IV. Ancef 2 g IV antibiotic was administered within 1 hour of the procedure. ANESTHESIA/SEDATION: General anesthesia. CONTRAST:  Omnipaque 300 approximately 120 cc. FLUOROSCOPY TIME:  Fluoroscopy Time: 76 minutes 36) seconds (1257 mGy). COMPLICATIONS: None immediate. TECHNIQUE: Informed written consent was obtained from the patient after a thorough discussion of the procedural risks, benefits and alternatives. All questions were addressed. Maximal Sterile Barrier Technique was utilized including caps, mask, sterile gowns, sterile gloves, sterile drape, hand hygiene and skin antiseptic. A timeout was performed prior to the initiation of the procedure. The left groin was prepped and draped in the usual sterile fashion. Thereafter using modified Seldinger technique, transfemoral access into the left common femoral artery was obtained without difficulty. Over a 0.035 inch guidewire, an 8  French 25 cm Pinnacle sheath was inserted. Through this, and also over 0.035 inch guidewire, a 120 cm 6 French Simmons 2 catheter inside of an 087 95 cm balloon guide catheter combination was advanced to the aortic arch region and selectively positioned in the proximal right common carotid artery and the innominate artery. An arteriogram was then performed centered extra cranially and intracranially. FINDINGS: The innominate arteriogram demonstrates significant tortuosity of the innominate artery in its entirety extending into the right common carotid artery with a 180 degree loop in the proximal right common carotid artery. Subclavian artery demonstrates patency with opacification of a hypoplastic right vertebral artery extra cranially. The right common carotid arteriogram demonstrates complex atherosclerotic calcified plaque at the right common carotid bifurcation extending into the bulb region of the right internal carotid artery resulting in a high-grade approximately 80-90% stenosis. More distally, the right internal carotid artery opacifies to the cranial skull base. Patency is seen of the petrous, cavernous and supraclinoid right ICA with a prominent right posterior communicating artery. Proximal portions of the right middle cerebral artery and the right anterior cerebral artery demonstrate patency. ENDOVASCULAR REVASCULARIZATION OF HIGH-GRADE STENOSIS OF THE RIGHT INTERNAL CAROTID ARTERY PROXIMALLY WITH BALLOON ANGIOPLASTY The balloon guide catheter was advanced into the proximal right common carotid artery. Through this, and also over a 0.014 inch Transend EX soft tip micro guidewire, an 021 150 cm microcatheter was then advanced with biplane DSA roadmap  to the proximal cavernous right ICA followed by the microcatheter. The micro guidewire was removed. Good aspiration obtained from the hub of the microcatheter. A gentle control arteriogram performed through the microcatheter demonstrated safe positioning of  the tip of the microcatheter. This in turn was then exchanged out for a 300 cm 014 inch BMW exchange wire with a moderate J configuration. The tip of the micro guidewire was positioned at the petrous cavernous junction. Measurements were then performed of the right internal carotid artery distal to the stenosis, and just proximal to the stenosis of the distal common carotid artery. A 5 mm x 20 mm 014 inch Viatrac balloon guide catheter was then prepped and purged retrogradely with heparinized saline infusion, and retrogradely with 50% contrast and 50% heparinized saline infusion. Using the rapid exchange technique, the balloon catheter was then advanced without difficulty and positioned with the distal and proximal markers adequate distant from the site of the severe stenosis in the proximal right internal carotid artery. Slow control inflation was then performed using micro inflation syringe device via micro tubing. Inflations were performed slowly to 9 atmospheres where it was maintained for approximately 60 seconds. Thereafter, the balloon was deflated and retrieved and removed. A control arteriogram performed through the balloon guide catheter in the proximal right common carotid artery demonstrated significantly improved caliber and flow through the angioplastied segment. More distally, free flow was noted into the distal right ICA intracranially. Over the exchange BMW micro guidewire, a 8 mm x 26 mm Wallstent stent delivery system which had been prepped with heparinized saline infusion was then advanced again using the rapid exchange technique to the proximal right common carotid artery. Resistance was encountered to further advancement of the stent delivery apparatus. At this time the balloon guide catheter was advanced to the distal right common carotid artery. Further attempts were made to advance the stent delivery apparatus into the right internal carotid proximally. Advancement could only manage just  distal to the angioplastied segment with no significant coverage of the angioplastied segment. Further attempts at advancing were met with resistance secondary to a step-off related to the calcified plaque. After multiple attempts it was decided to stop the procedure. Final control arteriogram performed through the balloon guide catheter in the right common carotid artery continued to demonstrate excellent flow through the right external carotid artery with a mild-to-moderate stenosis at its origin. The right internal carotid artery proximally demonstrated approximately 60% patency which was maintained on subsequent 20 minute arteriogram. Centered intracranially demonstrated brisk flow into the right anterior cerebral artery and the right middle cerebral distribution. The previously occluded anterior branch of the inferior division proximally due to a calcified plaque remained stable. The delayed arterial phase demonstrated retrograde opacification of the posterior 2/3 of the cortical branches in the sylvian triangle from the branches of the inferior division and the superior division, and also from the pericallosal branches. The balloon guide catheter was removed. The 8 French Pinnacle sheath in the left common femoral artery was removed with manual compression held for approximately 25 minutes with a quick clot to achieved hemostasis at this site. Distal pulses remained palpable in the dorsalis pedis arteries bilaterally unchanged. An immediate CT of the brain demonstrated no evidence of hemorrhagic complications. The patient was then gradually extubated without difficulty. Patient was able to maintain his airway, and oxygen concentrations. He was able to move his right side spontaneously and to command. The patient was able to follow simple commands appropriately. No significant motor movement  was evident in the left upper extremity, with only a flicker in the left foot toes. Patient was then transferred to the  PACU and then subsequently the neuro ICU for post revascularization care. Later in the afternoon, the patient appeared more awake, alert and appropriately responsive. Had mild intermittent right gaze deviation but was able to track his eyes to the left past the midline. Otherwise, neurologically he remained stable unchanged. 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 PLAN: Follow-up in the clinic 4 to 6 weeks post discharge. Electronically Signed   By: Luanne Bras M.D.   On: 01/03/2022 08:12   DG Swallowing Func-Speech Pathology  Result Date: 01/02/2022 Table formatting from the original result was not included. Objective Swallowing Evaluation: Type of Study: MBS-Modified Barium Swallow Study  Patient Details Name: ALESSIO BOGAN MRN: [id number] Date of Birth: 02-13-1932 Today's Date: 01/02/2022 Time: SLP Start Time (ACUTE ONLY): 1100 -SLP Stop Time (ACUTE ONLY): 1123 SLP Time Calculation (min) (ACUTE ONLY): 23 min Past Medical History: Past Medical History: Diagnosis Date  Arthritis   Heart murmur   asa child   Hypertension   Stroke [facility])  Past Surgical History: Past Surgical History: Procedure Laterality Date  EYE SURGERY Bilateral 2022  JOINT REPLACEMENT    RADIOLOGY WITH ANESTHESIA N/A 01/01/2022  Procedure: Angiogram;  Surgeon: Luanne Bras, MD;  Location: [place];  Service: Radiology;  Laterality: N/A;  TONSILLECTOMY    TOTAL KNEE ARTHROPLASTY Right 03/30/2013  Procedure: RIGHT TOTAL KNEE ARTHROPLASTY;  Surgeon: Tobi Bastos, MD;  Location: [facility];  Service: Orthopedics;  Laterality: Right;  TOTAL KNEE ARTHROPLASTY Left 10/09/2020  Procedure: TOTAL KNEE ARTHROPLASTY;  Surgeon: Paralee Cancel, MD;  Location: [facility];  Service: Orthopedics;  Laterality: Left;  70 mins HPI: 88 yo male presenting to the ED on 5/28 with L sided  weakness, facial droop, slurred speech, and fall. 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 PMH including hypothyroidism chronic pain syndromes, CKD, HTN, arthritis, bil TKA.  Subjective: alert, friendly  Recommendations for follow up therapy are one component of a multi-disciplinary discharge planning process, led by the attending physician.  Recommendations may be updated based on patient status, additional functional criteria and insurance authorization. Assessment / Plan / Recommendation   01/02/2022  11:00 AM Clinical Impressions Clinical Impression Pt presents with oropharyngeal dysphagia characterized by reduced labial seal, impaired mastication, a pharyngeal delay and reduction in bolus cohesion, posterior bolus propulsion, tongue base retraction, anterior laryngeal movement, and pharyngeal constriction. He demonstrated left-sided anterior spillage, lingual residue, difficulty with A-P transport of solids, premature spillage to the valleculae and pyriform sinuses, and posterior pharyngeal wall residue. A liquid bolus was necessary to facilitate mastication and A-P transport of a nutrigrain bar. Liquid washes reduced pharyngeal residue to a more functional level and a left head turn was effective in reducing pyriform sinus residue with thin liquids, but pt exhibited notable difficulty maintaining this position. Penetration (PAS 3, 5) and aspiration (PAS 7, 8) were noted with thin liquids. Larger amounts of aspirate did trigger a cough which was mostly effective, but smaller quantities inconsistently triggered throat clearing which was inadequate for expulsion of material. No functional benefit was noted with prompted coughing. A dysphagia 1 diet with nectar thick liquids is recommended at this time. SLP will follow for dysphagia treatment. SLP Visit Diagnosis  Dysphagia, oropharyngeal phase (R13.12) Impact on safety and function Mild aspiration risk      01/02/2022  11:00 AM Treatment Recommendations Treatment Recommendations Therapy as outlined in treatment plan below     01/02/2022  11:00 AM Prognosis Prognosis for Safe Diet Advancement Good   01/02/2022  11:00 AM Diet Recommendations SLP Diet Recommendations Dysphagia 1 (Puree) solids;Nectar thick liquid Liquid Administration via Cup;Straw Medication Administration Whole meds with puree Compensations Slow rate;Small sips/bites;Monitor for anterior loss;Follow solids with liquid Postural Changes Seated upright at 90 degrees     01/02/2022  11:00 AM Other Recommendations Other Recommendations Order thickener from pharmacy Follow Up Recommendations Acute inpatient rehab (3hours/day) Assistance recommended at discharge Frequent or constant Supervision/Assistance Functional Status Assessment Patient has had a recent decline in their functional status and demonstrates the ability to make significant improvements in function in a reasonable and predictable amount of time.   01/02/2022  11:00 AM Frequency and Duration  Speech Therapy Frequency (ACUTE ONLY) min 2x/week Treatment Duration 2 weeks     01/02/2022  11:00 AM Oral Phase Oral Phase Impaired Oral - Nectar Cup Left anterior bolus loss;Reduced posterior propulsion;Lingual/palatal residue;Delayed oral transit;Decreased bolus cohesion;Premature spillage Oral - Nectar Straw Left anterior bolus loss;Reduced posterior propulsion;Lingual/palatal residue;Delayed oral transit;Decreased bolus cohesion;Premature spillage Oral - Thin Cup Left anterior bolus loss;Reduced posterior propulsion;Lingual/palatal residue;Delayed oral transit;Decreased bolus cohesion;Premature spillage Oral - Thin Straw Left anterior bolus loss;Reduced posterior propulsion;Lingual/palatal residue;Delayed oral transit;Decreased bolus cohesion;Premature spillage Oral - Puree Left anterior bolus loss;Reduced posterior propulsion;Lingual/palatal residue;Delayed oral transit;Decreased bolus cohesion;Weak lingual  manipulation Oral - Mech Soft Left anterior bolus loss;Reduced posterior propulsion;Lingual/palatal residue;Delayed oral transit;Decreased bolus cohesion;Weak lingual manipulation;Impaired mastication Oral - Pill Left anterior bolus loss;Reduced posterior propulsion;Lingual/palatal residue;Delayed oral transit;Decreased bolus cohesion    01/02/2022  11:00 AM Pharyngeal Phase Pharyngeal Phase Impaired Pharyngeal- Thin Cup Reduced tongue base retraction;Reduced anterior laryngeal mobility;Delayed swallow initiation-pyriform sinuses;Reduced pharyngeal peristalsis;Penetration/Aspiration during swallow;Penetration/Apiration after swallow;Pharyngeal residue - valleculae;Pharyngeal residue - pyriform;Pharyngeal residue - posterior pharnyx Pharyngeal Material enters airway, remains ABOVE vocal cords and not ejected out;Material enters airway, CONTACTS cords and not ejected out;Material enters airway, passes BELOW cords and not ejected out despite cough attempt by patient;Material enters airway, passes BELOW cords without attempt by patient to eject out (silent aspiration) Pharyngeal- Thin Straw Reduced tongue base retraction;Reduced anterior laryngeal mobility;Delayed swallow initiation-pyriform sinuses;Reduced pharyngeal peristalsis;Penetration/Aspiration during swallow;Penetration/Apiration after swallow;Pharyngeal residue - valleculae;Pharyngeal residue - pyriform;Pharyngeal residue - posterior pharnyx Pharyngeal Material enters airway, passes BELOW cords and not ejected out despite cough attempt by patient;Material enters airway, passes BELOW cords without attempt by patient to eject out (silent aspiration);Material enters airway, CONTACTS cords and then ejected out Pharyngeal- Puree Reduced tongue base retraction;Reduced anterior laryngeal mobility;Delayed swallow initiation-pyriform sinuses;Reduced pharyngeal peristalsis;Pharyngeal residue - valleculae;Pharyngeal residue - pyriform;Pharyngeal residue - posterior pharnyx  Pharyngeal- Mechanical Soft Reduced tongue base retraction;Reduced anterior laryngeal mobility;Delayed swallow initiation-pyriform sinuses;Reduced pharyngeal peristalsis;Pharyngeal residue - valleculae;Pharyngeal residue - pyriform;Pharyngeal residue - posterior pharnyx Pharyngeal- Pill Reduced tongue base retraction;Reduced anterior laryngeal mobility;Delayed swallow initiation-pyriform sinuses;Reduced pharyngeal peristalsis;Pharyngeal residue - valleculae;Pharyngeal residue - pyriform;Pharyngeal residue - posterior pharnyx    01/02/2022  11:00 AM Cervical Esophageal Phase  Cervical Esophageal Phase [facility] Shanika I. Hardin Negus, [place], [place] Office number [phone] Pager [place] 01/02/2022, 12:20 PM                     VAS US CAROTID  Result Date: 12/31/2021 Carotid Arterial Duplex Study Patient Name:  RONDARIUS KADRMAS  Ruben  Date of Exam:   12/30/2021 Medical Rec #: [id number]          Accession #:    [id number] Date of Birth: 12-12-1931           Patient Gender: M Patient Age:   33 years Exam Location:  [facility] Procedure:      VAS US CAROTID Referring Phys: Nyoka Lint DOUTOVA --------------------------------------------------------------------------------  Indications:       CVA. Risk Factors:      Hypertension. Comparison Study:  No prior study Performing Technologist: Maudry Mayhew MHA, RDMS, RVT, RDCS  Examination Guidelines: A complete evaluation includes B-mode imaging, spectral Doppler, color Doppler, and power Doppler as needed of all accessible portions of each vessel. Bilateral testing is considered an integral part of a complete examination. Limited examinations for reoccurring indications may be performed as noted.  Right Carotid Findings: +----------+-------+--------+--------+-------------------------------+---------+           PSV    EDV cm/sStenosisPlaque Description             Comments            cm/s                                                             +----------+-------+--------+--------+-------------------------------+---------+ CCA Prox  54     7               heterogenous and irregular               +----------+-------+--------+--------+-------------------------------+---------+ CCA Distal58     9               smooth and heterogenous                  +----------+-------+--------+--------+-------------------------------+---------+ ICA Prox  132    19              smooth, heterogenous and       Shadowing                                  calcific                                 +----------+-------+--------+--------+-------------------------------+---------+ ICA Distal78     15                                                       +----------+-------+--------+--------+-------------------------------+---------+ ECA       126                                                             +----------+-------+--------+--------+-------------------------------+---------+ +----------+--------+-------+----------------+-------------------+           PSV cm/sEDV cmsDescribe        Arm Pressure (mmHG) +----------+--------+-------+----------------+-------------------+ [id number]  Multiphasic, WNL                    +----------+--------+-------+----------------+-------------------+ +---------+--------+--+--------+-+---------+ VertebralPSV cm/s51EDV cm/s5Antegrade +---------+--------+--+--------+-+---------+  Left Carotid Findings: +----------+--------+--------+--------+------------------------------+---------+           PSV cm/sEDV cm/sStenosisPlaque Description            Comments  +----------+--------+--------+--------+------------------------------+---------+ CCA Prox  89                      heterogenous and irregular    tortuous  +----------+--------+--------+--------+------------------------------+---------+ CCA Distal70      8                                                        +----------+--------+--------+--------+------------------------------+---------+ ICA Prox  49      12              heterogenous, irregular and   Shadowing                                   calcific                                +----------+--------+--------+--------+------------------------------+---------+ ICA Distal98      18                                                      +----------+--------+--------+--------+------------------------------+---------+ ECA       67      10                                                      +----------+--------+--------+--------+------------------------------+---------+ +----------+--------+--------+----------------+-------------------+           PSV cm/sEDV cm/sDescribe        Arm Pressure (mmHG) +----------+--------+--------+----------------+-------------------+ [id number]             Multiphasic, WNL                    +----------+--------+--------+----------------+-------------------+ +---------+--------+--+--------+--+---------+ VertebralPSV cm/s94EDV cm/s14Antegrade +---------+--------+--+--------+--+---------+   Summary: Right Carotid: Velocities in the right ICA are consistent with a 1-39% stenosis. Left Carotid: Velocities in the left ICA are consistent with a 1-39% stenosis. Vertebrals:  Bilateral vertebral arteries demonstrate antegrade flow. Subclavians: Normal flow hemodynamics were seen in bilateral subclavian              arteries. *See table(s) above for measurements and observations.  Electronically signed by Antony Contras MD on 12/31/2021 at 3:59:42 PM.    Final    ECHOCARDIOGRAM COMPLETE  Result Date: 12/30/2021    ECHOCARDIOGRAM REPORT   Patient Name:   THADD APUZZO Date of Exam: 12/30/2021 Medical Rec #:  [id number]         Height:       66.0 in Accession #:    [id number]        Weight:  133.6 lb Date of Birth:  09-Jun-1932          BSA:          1.685 m  Patient Age:    61 years          BP:           161/84 mmHg Patient Gender: M                 HR:           66 bpm. Exam Location:  Inpatient Procedure: 2D Echo Indications:    stroke  History:        Patient has prior history of Echocardiogram examinations, most                 recent 04/25/2020. Chronic kidney disease.; Risk                 Factors:Hypertension and Dyslipidemia.  Sonographer:    Johny Chess RDCS Referring Phys: [place]  1. Left ventricular ejection fraction, by estimation, is 55 to 60%. The left ventricle has normal function. The left ventricle has no regional wall motion abnormalities. There is mild left ventricular hypertrophy. Left ventricular diastolic parameters are consistent with Grade I diastolic dysfunction (impaired relaxation).  2. Right ventricular systolic function is normal. The right ventricular size is normal.  3. Left atrial size was mildly dilated.  4. The mitral valve is normal in structure. Mild mitral valve regurgitation. No evidence of mitral stenosis.  5. The aortic valve is tricuspid. Aortic valve regurgitation is mild. Aortic valve sclerosis/calcification is present, without any evidence of aortic stenosis.  6. The inferior vena cava is normal in size with greater than 50% respiratory variability, suggesting right atrial pressure of 3 mmHg. FINDINGS  Left Ventricle: Left ventricular ejection fraction, by estimation, is 55 to 60%. The left ventricle has normal function. The left ventricle has no regional wall motion abnormalities. The left ventricular internal cavity size was normal in size. There is  mild left ventricular hypertrophy. Left ventricular diastolic parameters are consistent with Grade I diastolic dysfunction (impaired relaxation). Right Ventricle: The right ventricular size is normal. Right ventricular systolic function is normal. Left Atrium: Left atrial size was mildly dilated. Right Atrium: Right atrial size was normal in  size. Pericardium: Trivial pericardial effusion is present. Mitral Valve: The mitral valve is normal in structure. Mild mitral annular calcification. Mild mitral valve regurgitation. No evidence of mitral valve stenosis. Tricuspid Valve: The tricuspid valve is normal in structure. Tricuspid valve regurgitation is trivial. No evidence of tricuspid stenosis. Aortic Valve: The aortic valve is tricuspid. Aortic valve regurgitation is mild. Aortic regurgitation PHT measures 400 msec. Aortic valve sclerosis/calcification is present, without any evidence of aortic stenosis. Pulmonic Valve: The pulmonic valve was normal in structure. Pulmonic valve regurgitation is trivial. No evidence of pulmonic stenosis. Aorta: The aortic root is normal in size and structure. Venous: The inferior vena cava is normal in size with greater than 50% respiratory variability, suggesting right atrial pressure of 3 mmHg. IAS/Shunts: No atrial level shunt detected by color flow Doppler.  LEFT VENTRICLE PLAX 2D LVIDd:         5.20 cm   Diastology LVIDs:         3.10 cm   LV e' medial:    4.50 cm/s LV PW:         1.20 cm   LV E/e' medial:  12.0 LV IVS:  1.10 cm   LV e' lateral:   4.20 cm/s LVOT diam:     2.10 cm   LV E/e' lateral: 12.9 LV SV:         80 LV SV Index:   47 LVOT Area:     3.46 cm  RIGHT VENTRICLE             IVC RV S prime:     15.70 cm/s  IVC diam: 1.00 cm TAPSE (M-mode): 2.0 cm LEFT ATRIUM             Index        RIGHT ATRIUM           Index LA diam:        3.40 cm 2.02 cm/m   RA Area:     16.30 cm LA Vol (A2C):   79.2 ml 47.01 ml/m  RA Volume:   39.80 ml  23.62 ml/m LA Vol (A4C):   47.5 ml 28.20 ml/m LA Biplane Vol: 63.0 ml 37.40 ml/m  AORTIC VALVE LVOT Vmax:   102.00 cm/s LVOT Vmean:  67.600 cm/s LVOT VTI:    0.230 m AI PHT:      400 msec  AORTA Ao Root diam: 3.70 cm Ao Asc diam:  3.10 cm MITRAL VALVE MV Area (PHT): 4.60 cm    SHUNTS MV Decel Time: 165 msec    Systemic VTI:  0.23 m MR Peak grad: 134.6 mmHg    Systemic Diam: 2.10 cm MR Mean grad: 82.0 mmHg MR Vmax:      580.00 cm/s MR Vmean:     416.0 cm/s MV E velocity: 54.00 cm/s MV A velocity: 96.40 cm/s MV E/A ratio:  0.56 Kirk Ruths MD Electronically signed by Kirk Ruths MD Signature Date/Time: 12/30/2021/2:40:23 PM    Final    CT ANGIO HEAD NECK W WO CM  Result Date: 12/30/2021 CLINICAL DATA:  Stroke/TIA, determine embolic source EXAM: CT ANGIOGRAPHY HEAD AND NECK TECHNIQUE: Multidetector CT imaging of the head and neck was performed using the standard protocol during bolus administration of intravenous contrast. Multiplanar CT image reconstructions and MIPs were obtained to evaluate the vascular anatomy. Carotid stenosis measurements (when applicable) are obtained utilizing NASCET criteria, using the distal internal carotid diameter as the denominator. RADIATION DOSE REDUCTION: This exam was performed according to the departmental dose-optimization program which includes automated exposure control, adjustment of the mA and/or kV according to patient size and/or use of iterative reconstruction technique. CONTRAST:  10m OMNIPAQUE IOHEXOL 350 MG/ML SOLN COMPARISON:  Same day MRI/MRA. FINDINGS: CT HEAD FINDINGS Brain: Small right MCA territory infarcts better characterized on same day MRI. No evidence of acute hemorrhage, mass effect, midline shift, extra-axial fluid collection, or hydrocephalus. Patchy white matter hypodensities, nonspecific but compatible with chronic microvascular ischemic disease. Vascular: Detailed below. Skull: No acute fracture. Sinuses: Mild paranasal sinus mucosal thickening. Orbits: No acute orbital findings. Review of the MIP images confirms the above findings CTA NECK FINDINGS Aortic arch: Calcific atherosclerosis of the aorta and great vessel origins. Ectatic right subclavian artery. Right carotid system: Atherosclerosis at the carotid bifurcation with approximately 80% stenosis. Left carotid system: Atherosclerosis at the  carotid bifurcation without greater than 50% stenosis. Vertebral arteries: Potentially severe stenosis of the right vertebral artery origin due to calcific atherosclerosis. Otherwise, vertebral arteries are patent without significant (greater than 50%) stenosis. Left dominant. Skeleton: Severe multilevel degenerative change. Other neck: No acute findings. Upper chest: Clear sinuses. Review of the MIP images confirms the above  findings CTA HEAD FINDINGS Anterior circulation: Bilateral intracranial ICAs are patent with mild narrowing due to calcific atherosclerosis. Bilateral M1 MCAs and left M2 MCAs are patent. Apparent occlusion of a right M2 MCA branch in a region of calcification. Bilateral ACAs are patent. Posterior circulation: Left dominant intradural vertebral artery. Right intradural vertebral artery makes very small contribution to the basilar artery. Basilar artery and bilateral posterior cerebral arteries are patent without proximal hemodynamically significant stenosis. Venous sinuses: As permitted by contrast timing, patent. Anatomic variants: Detailed above. Review of the MIP images confirms the above findings IMPRESSION: CTA: 1. 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 2. Extensive calcific atherosclerosis at the right carotid bifurcation with approximately 80% stenosis of the proximal ICA. 3. 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 CT head: Small right MCA territory infarcts better characterized on same day MRI. No evidence of progressive mass effect or acute hemorrhage. Findings discussed with Dr. Erlinda Hong via telephone at 12:50 p.m. Electronically Signed   By: Margaretha Sheffield M.D.   On: 12/30/2021 13:00   MR BRAIN WO CONTRAST  Result Date: 12/30/2021 CLINICAL DATA:  There are deficit with acute  stroke suspected EXAM: MRI HEAD WITHOUT CONTRAST MRA HEAD WITHOUT CONTRAST TECHNIQUE: Multiplanar, multi-echo pulse sequences of the brain and surrounding structures were acquired without intravenous contrast. Angiographic images of the Circle of Willis were acquired using MRA technique without intravenous contrast. COMPARISON:  Head CT from yesterday FINDINGS: MRI HEAD FINDINGS Brain: Cluster of small acute infarcts in the right insular cortex, right frontal cortex, and deep right cerebral white matter. These are in a MCA distribution. Background of advanced chronic small vessel ischemia with confluent gliosis in the hemispheric white matter. Chronic lacunar infarcts in the deep cerebrum. Small remote left cerebral infarct. Gradient signal at the lower right sylvian fissure with there is calcification along the MCA branches by CT. Vascular: Major flow voids are preserved Skull and upper cervical spine: Normal marrow signal Sinuses/Orbits: Bilateral cataract resection MRA HEAD FINDINGS Anterior circulation: High-grade narrowing involving the upper right M2 branch with there is a gradient hypointensity noted above. Elsewhere vessels are smoothly contoured and widely patent. Posterior circulation: Vertebrobasilar arteries are smoothly contoured and widely patent. IMPRESSION: Brain MRI: 1. Cluster of small cortical and white matter infarcts within the right MCA distribution. 2. Background of advanced chronic small vessel ischemia. Intracranial MRA: Focal high-grade narrowing at the right M2 level where there is calcification by CT. This could reflect calcified plaque or a calcified embolus. If an embolus, the proximal nature and degree of mild infarct with suggest additional territory of risk. Electronically Signed   By: Jorje Guild M.D.   On: 12/30/2021 07:53   MR ANGIO HEAD WO CONTRAST  Result Date: 12/30/2021 CLINICAL DATA:  There are deficit with acute stroke suspected EXAM: MRI HEAD WITHOUT CONTRAST MRA  HEAD WITHOUT CONTRAST TECHNIQUE: Multiplanar, multi-echo pulse sequences of the brain and surrounding structures were acquired without intravenous contrast. Angiographic images of the Circle of Willis were acquired using MRA technique without intravenous contrast. COMPARISON:  Head CT from yesterday FINDINGS: MRI HEAD FINDINGS Brain: Cluster of small acute infarcts in the right insular cortex, right frontal cortex, and deep right cerebral white matter. These are in a MCA distribution. Background of advanced chronic small vessel ischemia with confluent gliosis in the hemispheric white matter. Chronic lacunar infarcts  in the deep cerebrum. Small remote left cerebral infarct. Gradient signal at the lower right sylvian fissure with there is calcification along the MCA branches by CT. Vascular: Major flow voids are preserved Skull and upper cervical spine: Normal marrow signal Sinuses/Orbits: Bilateral cataract resection MRA HEAD FINDINGS Anterior circulation: High-grade narrowing involving the upper right M2 branch with there is a gradient hypointensity noted above. Elsewhere vessels are smoothly contoured and widely patent. Posterior circulation: Vertebrobasilar arteries are smoothly contoured and widely patent. IMPRESSION: Brain MRI: 1. Cluster of small cortical and white matter infarcts within the right MCA distribution. 2. Background of advanced chronic small vessel ischemia. Intracranial MRA: Focal high-grade narrowing at the right M2 level where there is calcification by CT. This could reflect calcified plaque or a calcified embolus. If an embolus, the proximal nature and degree of mild infarct with suggest additional territory of risk. Electronically Signed   By: Jorje Guild M.D.   On: 12/30/2021 07:53   DG Chest Portable 1 View  Result Date: 12/29/2021 CLINICAL DATA:  CVA. Fell last night. Family concerned patient injured left side EXAM: PORTABLE CHEST 1 VIEW COMPARISON:  Chest two views 03/23/2013  FINDINGS: Cardiac silhouette is mildly enlarged. Moderate calcifications within the aortic arch and descending thoracic aorta. Minimal bibasilar horizontal linear chronic scarring is unchanged. No focal airspace opacity to indicate pneumonia. No pleural effusion or pneumothorax. Moderate multilevel degenerative disc changes of the thoracic spine. IMPRESSION: Mild stable cardiomegaly. No acute lung process. Electronically Signed   By: Yvonne Kendall M.D.   On: 12/29/2021 19:19   DG Hand Complete Left  Result Date: 12/29/2021 CLINICAL DATA:  Fall.  Bruising to posterior left hand. EXAM: LEFT HAND - COMPLETE 3+ VIEW COMPARISON:  None Available. FINDINGS: There is diffuse decreased bone mineralization. A pulse oximeter overlies the distal index finger obscuring portions. Degenerative changes including joint space narrowing, subchondral sclerosis and peripheral osteophytosis are moderate at the triscaphe joint; moderate to severe at the thumb carpometacarpal, thumb metacarpophalangeal, and thumb interphalangeal joints; and moderate to severe at the DIP joints and moderate at the PIP joints of the second through fifth fingers. No acute fracture is seen. No dislocation. IMPRESSION: Osteoarthritis as above. No acute fracture. Electronically Signed   By: Yvonne Kendall M.D.   On: 12/29/2021 18:12   DG Elbow Complete Left  Result Date: 12/29/2021 CLINICAL DATA:  Fall.  Left elbow pain and bruising. EXAM: LEFT ELBOW - COMPLETE 3+ VIEW COMPARISON:  None Available. FINDINGS: No fracture or dislocation. Narrowed radiocapitellar joint. Marginal osteophytes project from the base of the radial head. No joint effusion. There is soft tissue edema posteriorly. IMPRESSION: No fracture or dislocation. Electronically Signed   By: Lajean Manes M.D.   On: 12/29/2021 18:12   CT Cervical Spine Wo Contrast  Result Date: 12/29/2021 CLINICAL DATA:  There logic deaf sick, neck trauma EXAM: CT CERVICAL SPINE WITHOUT CONTRAST TECHNIQUE:  Multidetector CT imaging of the cervical spine was performed without intravenous contrast. Multiplanar CT image reconstructions were also generated. RADIATION DOSE REDUCTION: This exam was performed according to the departmental dose-optimization program which includes automated exposure control, adjustment of the mA and/or kV according to patient size and/or use of iterative reconstruction technique. COMPARISON:  None Available. FINDINGS: Alignment: Mild degenerative anterolisthesis of C2 and C3. Otherwise alignment is anatomic. Skull base and vertebrae: No acute fracture. No primary bone lesion or focal pathologic process. Soft tissues and spinal canal: No prevertebral fluid or swelling. No visible canal hematoma. Disc levels:  Partial bony fusion across the disc spaces at C3-4 and C4-5. There is severe multilevel spondylosis throughout the remainder of the cervical spine, greatest at C5-6 and C6-7. Diffuse facet hypertrophy. Upper chest: Airway is patent. Lung apices are clear. Prominent atherosclerosis of the aortic arch. Other: Reconstructed images demonstrate no additional findings. IMPRESSION: 1. Extensive multilevel cervical degenerative changes. No acute fracture. Electronically Signed   By: Randa Ngo M.D.   On: 12/29/2021 17:38   CT HEAD WO CONTRAST  Result Date: 12/29/2021 CLINICAL DATA:  Neurologic deficit EXAM: CT HEAD WITHOUT CONTRAST TECHNIQUE: Contiguous axial images were obtained from the base of the skull through the vertex without intravenous contrast. RADIATION DOSE REDUCTION: This exam was performed according to the departmental dose-optimization program which includes automated exposure control, adjustment of the mA and/or kV according to patient size and/or use of iterative reconstruction technique. COMPARISON:  None Available. FINDINGS: Brain: Confluent hypodensities throughout the periventricular white matter are most consistent with age-indeterminate small vessel ischemic changes,  likely chronic. Focal hypodensities in the left basal ganglia and right frontal periventricular white matter consistent with chronic lacunar infarcts. Age-indeterminate lacunar infarct is seen within the right basal ganglia image 18/3. No other signs of acute infarct or hemorrhage. Lateral ventricles and midline structures are otherwise unremarkable. No acute extra-axial fluid collections. No mass effect. Vascular: No hyperdense vessel or unexpected calcification. Skull: Normal. Negative for fracture or focal lesion. Sinuses/Orbits: No acute finding. Other: None. IMPRESSION: 1. Age indeterminate lacunar infarct within the right basal ganglia. 2. Age-indeterminate small-vessel ischemic changes throughout the periventricular white matter, favor chronic. 3. Chronic left basal ganglia and right frontal white matter lacunar infarcts as above. 4. No acute hemorrhage. Electronically Signed   By: Randa Ngo M.D.   On: 12/29/2021 17:35     PHYSICAL EXAM  Temp:  [98.2 F (36.8 C)-99.7 F (37.6 C)] 98.2 F (36.8 C) (06/21 1230) Pulse Rate:  [89-116] 111 (06/21 0603) Resp:  [19-34] 19 (06/21 0603) BP: (123-182)/(58-96) 166/84 (06/21 0830) SpO2:  [100 %] 100 % (06/21 0603) FiO2 (%):  [100 %] 100 % (06/21 0400)  General - Well nourished, well developed, .  Ophthalmologic - fundi not visualized due to noncooperation.  Cardiovascular - Regular rate and rhythm.  Neuro -drowsy, eyes closed but open on voice, following few commands on the right side only  . With eye opening, eyes in mid position, inconsistently blinking to visual threat, doll's eyes present, not tracking.  Corneal reflex present, gag and cough present. Breathing over the vent.  Facial symmetry not able to test due to ET tube.  Tongue protrusion not cooperative. Spontaneous moving right UE against gravity and LE with knee flexion, but left upper extremity flaccid, left lower extremity flicker toe movement with painful stimuli. DTR diminished  and no babinski. Sensation, coordination not cooperative and gait not tested.    ASSESSMENT/PLAN Mr. ZYRELL CARMEAN is a 88 y.o. male with history of hypertension, hyperlipidemia, hearing loss, recent stroke status post right carotid angioplasty transfer back from CIR due to aspiration, respite failure.  Found to have severe anemia with GI bleeding, renal failure and seizure-like activity.  Stroke:  right MCA infarct extended from recent infarct, likely secondary to hypertension in the setting of sepsis and GI bleeding with known right ICA stenosis s/p angioplasty  Recent stroke: right MCA scattered small infarcts due to right M2 occlusion by calcified plaque,  secondary to large vessel disease source from right ICA high-grade stenosis CT 5/28 no acute abnormality,  old bilateral BG, right frontal white matter lacunar infarcts. MRA right M 2 high-grade stenosis MRI 5/29 right MCA scattered small infarcts CT head and neck 5/29 proximal right M2 occlusion due to calcified embolus. Extensive calcified atherosclerosis at right ICA bifurcation with 80% stenosis.  Severe stenosis nondominant right VA origin.  CT head 6/15 - moderate-sized acute/subacute cortical and subcortical infarct within the right frontoparietal lobes and right insula  MRI right frontal operculum acute to subacute infarct extending into the perirolandic cortex.  Punctate left frontal and right occipital infarct as well. MR angiogram brain shows signal loss in the right superior division with right M2 occlusion. CUS repeat pending 2D Echo EF 55 to 60% LDL 90 HgbA1c 6.1 aspirin 81 mg daily and Brilinta (ticagrelor) 90 mg bid prior to admission, now on aspirin 81 mg daily and Brilinta (ticagrelor) 90 mg bid after anemia stabilized Ongoing aggressive stroke risk factor management Therapy recommendations: Skilled nursing facility disposition: Pending  Seizure like activity 6/15 episode of left gaze, right upper extremity  posturing, status post Ativan EEG stat no seizure, moderate to severe diffuse encephalopathy Off LTM  Respiratory failure Aspiration pneumonia on Unasyn CCM on board Intubated for airway protection Vent management per CCM  Carotid stenosis CT head and neck 5/29 extensive calcified atherosclerosis at right ICA bifurcation with 80% stenosis. Likely the source for right M2 occlusion and right MCA stroke Dr. Estanislado Pandy did right carotid angioplasty 5/31, not able to have stent placement due to tortuosity of the vessel Continue Brilinta 90 mg twice daily and aspirin 81 mg daily if no more GI bleeding GIB Large dark stool 6/15 Severe anemia Hb 8.2->8.0->5.6->PRBC->9.9-> 9.5->9.5 GI on board EGD 6/16 found food in stomach and no significance and source of bleeding Close monitoring  History of hypertension Hypotension, improved Likely due to septic shock and severe anemia On IV fluid, received PRBC transfusion Off Levophed BP goal normotensive   Hyperlipidemia Home meds: Lovastatin 40 LDL 90, goal < 70 Now on Crestor 20 Continue statin at discharge   AKI on CKD Creatinine 1.3->3.84->3.24->3.12->2.95 CCM on board On TF @ 50 Avoid nephrotoxicity agents  Dysphagia On tube feeding now  Other Stroke Risk Factors Advanced age   Other Active Problems Hard of hearing  Hospital day # 7 Patient neurological status remains unchanged with dysarthria and dense left hemiplegia.  Continue aspirin and Brilinta.  Continue ongoing physical occupational therapy and transfer to skilled nursing facility when available.Starleen Blue out of bed as tolerated.  This patient is critically ill due to right MCA infarct extension, right ICA stenosis, respiratory failure, intubated on vent, aspiration pneumonia, GIB with severe anemia and at significant risk of neurological worsening, death form recurrent stroke, hemorrhagic conversion, severe anemia, respiratory failure. This patient's care requires  constant monitoring of vital signs, hemodynamics, respiratory and cardiac monitoring, review of multiple databases, neurological assessment, discussion with family, other specialists and medical decision making of high complexity. I spent 30 minutes of neurocritical care time in the care of this patient.  Discussed with patient's RN at the bedside and answered questions..  Stroke team will sign off.  Kindly call for questions  Antony Contras, MD  Stroke Neurology 01/22/2022 12:54 PM      To contact Stroke Continuity provider, please refer to [email or web address] After hours, contact General Neurology

## 2022-01-22 NOTE — Progress Notes (Signed)
SLP Cancellation Note  Patient Details Name: Chad [name] MRN: [id number] DOB: 02-24-1932   Cancelled treatment:       Reason Eval/Treat Not Completed: Patient not medically ready;Patient's level of consciousness. Per RN he has not any improvement since previous date. SLP will f/u next date for readiness.  Sonia Baller, MA, CCC-SLP Speech Therapy

## 2022-01-22 NOTE — Progress Notes (Signed)
Daily Progress Note   Patient Name: Chad [name]       Date: 01/22/2022 DOB: 05-15-1932  Age: 88 y.o. MRN#: [id number] Attending Physician: Collene Gobble, MD Primary Care Physician: [name], MD Admit Date: 01/12/2022 Length of Stay: 7 days  Reason for Consultation/Follow-up: Establishing goals of care  HPI/Patient Profile:  88 y.o. male  with past medical history of HTN, CKD, hld, and chronic pain admitted on 01/24/2022 with respiratory distress.  Patient initially admitted 5/29 with stroke and underwent balloon angioplasty of ICA.  He was transferred to CIR on 6/1.  During CIR admission his swallowing worsened and he had a feeding tube placed 6/9.  On 6/14 he developed respiratory distress and required readmission to the hospital with eventual transfer to ICU and intubation.  Patient also with a AKI and hyperkalemia.  Also with hemoglobin of 6.1.  GI consulted.  Found to have lactic acid of 4.  Found to have had another CVA.    PMT consulted to discuss goals of care.  Subjective:   Subjective: Chart Reviewed. Updates received. Patient Assessed. Created space and opportunity for patient  and family to explore thoughts and feelings regarding current medical situation.  Today's Discussion: Today met with the patient at the bedside.  No family was present.  I discussed the clinical case with the critical care resident and the nurse.  The patient is not opening her eyes but does follow some simple commands.  She is nonverbal.  She had sinus tachycardia earlier today.  There is transfer orders to go to the floor on telemetry.  She continues to be stable off the vent.  I discussed with clinical staff the plan from a palliative perspective to work with the family, allow time for outcomes, further goals of care discussions based on clinical progress.  I will be off for the next 2 weeks but my colleagues in the palliative medicine team will continue to follow with the patient.  Review of  Systems  Unable to perform ROS: Mental status change    Objective:   Vital Signs:  BP (!) 166/84 Comment: hydralazine given  Pulse (!) 111   Temp 98.5 F (36.9 C) (Oral)   Resp 19   Wt 62 kg   SpO2 100%   BMI 25.83 kg/m   Physical Exam: Physical Exam Vitals and nursing note reviewed.  Constitutional:      General: He is not in acute distress.    Appearance: He is ill-appearing.  HENT:     Head: Normocephalic and atraumatic.  Cardiovascular:     Rate and Rhythm: Tachycardia present.  Pulmonary:     Effort: Pulmonary effort is normal. No respiratory distress.  Abdominal:     General: Abdomen is flat.     Palpations: Abdomen is soft.  Skin:    General: Skin is warm and dry.  Neurological:     Mental Status: He is lethargic, disoriented and confused.     Comments: Follows simple commands     Palliative Assessment/Data: N/A   Assessment & Plan:   Impression: Present on Admission:  Acute respiratory failure with hypoxia ([place])  88 year old male status post admission for CVA and discharged to CIR with worsening dysphagia, signs of aspiration, core track placement, acute respiratory distress status post transfer to ICU and intubation.  It appears he has had another stroke.  MRI scheduled today to evaluate the extent of the damage.  The patient is doing well off the vent, follows simple commands  but not communicative/not opening eyes. Transfer orders to tele. PMT will continue to follow as she will need further [place] conversations based on clinical progress.  SUMMARY OF RECOMMENDATIONS   Remain DNR Time for Outcomes Continue current treatments for now Continue to engage family/GOC conversations as his clinical picture evolves PMT will continue to follow  Symptom Management:  Per primary team PMT is available to assist as needed  Code Status: DNR  Prognosis: Unable to determine  Discharge Planning: To Be Determined  Discussed with: Medical team, nursing  team  Thank you for allowing [place] to participate in the care of Chad [name] PMT will continue to support holistically.  Billing based on MDM: High  Problems Addressed: One acute or chronic illness or injury that poses a threat to life or bodily function  Amount and/or Complexity of Data: Category 3:Discussion of management or test interpretation with external physician/other qualified health care professional/appropriate source (not separately reported)  Risks: Decision not to resuscitate or to de-escalate care because of poor prognosis   Walden Field, NP Palliative Medicine Team  Team Phone # [phone] (Nights/Weekends)  04/02/2021, 8:17 AM

## 2022-01-23 LAB — BASIC METABOLIC PANEL
Anion gap: 11 (ref 5–15)
BUN: 51 mg/dL — ABNORMAL HIGH (ref 8–23)
CO2: 27 mmol/L (ref 22–32)
Calcium: 8 mg/dL — ABNORMAL LOW (ref 8.9–10.3)
Chloride: 108 mmol/L (ref 98–111)
Creatinine, Ser: 2.3 mg/dL — ABNORMAL HIGH (ref 0.61–1.24)
GFR, Estimated: 27 mL/min — ABNORMAL LOW (ref >60)
Glucose, Bld: 139 mg/dL — ABNORMAL HIGH (ref 70–99)
Potassium: 3.3 mmol/L — ABNORMAL LOW (ref 3.5–5.1)
Sodium: 146 mmol/L — ABNORMAL HIGH (ref 135–145)

## 2022-01-23 LAB — GLUCOSE, CAPILLARY
Glucose-Capillary: 102 mg/dL — ABNORMAL HIGH (ref 70–99)
Glucose-Capillary: 102 mg/dL — ABNORMAL HIGH (ref 70–99)
Glucose-Capillary: 104 mg/dL — ABNORMAL HIGH (ref 70–99)
Glucose-Capillary: 115 mg/dL — ABNORMAL HIGH (ref 70–99)
Glucose-Capillary: 122 mg/dL — ABNORMAL HIGH (ref 70–99)
Glucose-Capillary: 124 mg/dL — ABNORMAL HIGH (ref 70–99)
Glucose-Capillary: 140 mg/dL — ABNORMAL HIGH (ref 70–99)

## 2022-01-23 MED ORDER — ATENOLOL 25 MG PO TABS
100.0000 mg | ORAL_TABLET | Freq: Every morning | ORAL | Status: DC
  Administered 2022-01-24 – 2022-01-26 (×3): 100 mg
  Filled 2022-01-23 (×3): qty 4

## 2022-01-23 NOTE — Progress Notes (Signed)
Inpatient Rehab Admissions Coordinator:   Note therapy recommendations are for SNF.  Discussed with Dr. Letta Pate and rehab team and they agree SNF is more appropriate given poor progress and tolerance while on CIR.  We will not look to readmit.   Shann Medal, PT, DPT Admissions Coordinator [phone] 01/23/22  2:43 PM

## 2022-01-23 NOTE — Plan of Care (Signed)

## 2022-01-23 NOTE — Progress Notes (Signed)
PROGRESS NOTE    Chad [name]  [id number] DOB: 07/12/1932 DOA: 01/08/2022 PCP: [name], MD   Brief Narrative:   ICU Course 6/14 Admit from CIR, intubated upon arrival to ICU, concern for GI bleed, shock 6/15 convulsive movements of R arm, started LTM EEG, head CT findings worrisome for new stroke 6/16 EGD >> gastric polyps noted, erythema of gastric antrum, no ulcer, food in esophagus and stomach 6/17 palliative care and pulmonary spoke to patient's family>  6/18 MRI Brain >> acute/subacute non-hemorrhagic infarct involving the right frontal operculum, right middle frontal gyrus, left frontal lobe and right occipital lobe, remote lacunar infarcts of the basal ganglia & thalami bilaterally & in cerebellum, atrophy and white matter disease moderately advanced for age 19/18 MRA Brain >> signal loss at the site of the previous right superior division M2 occlusion, flow is distal to signal loss.  This likely represents a residual high grade stenosis, otherwise normal MRA circle of Willis 6/19 Reports of vomiting yesterday, none overnight. TF restarted 0400.  Self extubated 6/19.  GOC discussed, no plans for reintubation, DNR, comfort care if decompensates 6/20: Had a large melanotic stool.  Hemoglobin remained stable. 6/21: Na 156>>FW increased. Resumed atenolol for rate control. Tx orders placed.   Assessment and Plan:  Acute respiratory failure with hypoxia Likely secondary to aspiration/pneumonia. Patient required intubation on 6/14 and self-extubated on 6/19. Weaned to room air. Resolved.  Aspiration pneumonia Patient completed a 5 day course of Unasyn. Resolved.  AKI on CKD stage IIIa Baseline creatinine of about 1.3 - 1.4. Creatinine of 3.84 on admission with peak of 3.86.   Hypernatremia Secondary to decreased free water. Peak sodium of 150. Patient is managed via NG tube feeds. Increased to 300 mL of free water q4 hours. Sodium down to 146 today. -Continue free  water  Anasarca Noted. Secondary to hypoalbuminemia.  Malnutrition Patient currently requiring Cortrak for tube feeds. -Dietitian recommendations (6/20): Continue tube feeds via Cortrak: Advance Osmolite 1.5 by 10 ml q 6 hours to goal rate of 50 ml/hr (1200 ml/day) ProSource TF 45 ml daily Free water per [attending], currently 200 ml q 4 hours  Bladder outlet obstruction Foley catheter in place.  GI bleed Patient with multiple melanotic stool. Complicated by Brilinta and aspirin. Hemoglobin has remained stable. GI consulted and performed EGD on 6/16 without etiology for bleeding identified. Patient with epistaxis which may be contributing to melanotic stool. -Serial CBC/H&H  Acute CVA Patient with prior recent stroke. On current admission, patient with right MCA infarct that extended from recent infarct in setting of hypotension, complicated by known right ICA stenosis s/p angioplasty. MRI brain (6/18) confirms right frontal operculum with acute/subacute infarct extending into the perirolandic cortex in addition to punctate left frontal and right occipital infarct. MRA heard (6/18) significant for signal loss in right superior division with right M2 occlusion. Carotid ultrasound (6/18) significant for right stenosis of 1-39%; left carotid unable to evaluate secondary to patient movement. LDL of 90 from, hemoglobin A1C of 6.1% and transthoracic echocardiogram significant for normal LVEF without atrial level shunt from prior admission. Neurology recommendations for aspirin 81 mg daily and Brilinta 90 mg BID. PT/OT recommendations for SNF.  Carotid stenosis Right ICA stenosis s/p angioplasty.  Convulsions Episodes in the ICU. LTM EEG ordered and was negative for active seizure activity.  Chronic diastolic heart failure Stable.  Diabetes mellitus, type 2 Hemoglobin A1C of 6.1% from 12/2021. Currently on SSI for tube feeds. -Continue SSI   DVT  prophylaxis: Heparin subq Code Status:    Code Status: DNR Family Communication: None at bedside Disposition Plan: Discharge pending continued goals of care vs SNF   Consultants:  PCCM Neurology Gastroenterology Palliative care medicine  Procedures:  Intubation (6/14 >> 6/19) LTM EEG (6/15 >> 6/16; 6/16 >> 6/17) EGD (6/16) Cortrak (6/16>>  Antimicrobials: Unasyn IV    Subjective: No issues noted overnight.  Objective: BP (!) 167/66 (BP Location: Left Arm)   Pulse 74   Temp 98.4 F (36.9 C) (Oral)   Resp 20   Wt 59.1 kg   SpO2 100%   BMI 24.62 kg/m   Examination:  General exam: Appears calm and comfortable Respiratory system: Clear to auscultation. Respiratory effort normal. Cardiovascular system: S1 & S2 heard, RRR. No murmurs, rubs, gallops or clicks. Gastrointestinal system: Abdomen is nondistended, soft and nontender. Normal bowel sounds heard. Central nervous system: Alert. Follows commands with right sided movement. No left sided movement. Musculoskeletal: No edema. No calf tenderness Skin: No cyanosis. No rashes    Data Reviewed: I have personally reviewed following labs and imaging studies  CBC Lab Results  Component Value Date   WBC 7.3 01/22/2022   RBC 3.31 (L) 01/22/2022   HGB 9.4 (L) 01/22/2022   HCT 29.3 (L) 01/22/2022   MCV 88.5 01/22/2022   MCH 28.4 01/22/2022   PLT 243 01/22/2022   MCHC 32.1 01/22/2022   RDW 17.0 (H) 01/22/2022   LYMPHSABS 1.3 01/16/2022   MONOABS 5.0 (H) 01/16/2022   EOSABS 0.0 01/16/2022   BASOSABS 0.0 26/37/8588     Last metabolic panel Lab Results  Component Value Date   NA 146 (H) 01/23/2022   K 3.3 (L) 01/23/2022   CL 108 01/23/2022   CO2 27 01/23/2022   BUN 51 (H) 01/23/2022   CREATININE 2.30 (H) 01/23/2022   GLUCOSE 139 (H) 01/23/2022   GFRNONAA 27 (L) 01/23/2022   GFRAA 54 (L) 04/01/2013   CALCIUM 8.0 (L) 01/23/2022   PHOS 4.3 01/20/2022   PROT 6.6 01/21/2022   ALBUMIN 2.1 (L) 01/21/2022   BILITOT 1.0 01/21/2022   ALKPHOS 63  01/21/2022   AST 23 01/21/2022   ALT 15 01/21/2022   ANIONGAP 11 01/23/2022    GFR: Estimated Creatinine Clearance: 16.4 mL/min (A) (by C-G formula based on SCr of 2.3 mg/dL (H)).  Recent Results (from the past 240 hour(s))  Urine Culture     Status: None   Collection Time: 01/16/2022 10:27 AM   Specimen: Urine, Catheterized  Result Value Ref Range Status   Specimen Description URINE, CATHETERIZED  Final   Special Requests NONE  Final   Culture   Final    NO GROWTH Performed at [facility], [address] [place], [place] [address]    Report Status 01/16/2022 FINAL  Final  MRSA Next Gen by PCR, Nasal     Status: None   Collection Time: 01/12/2022 10:29 AM   Specimen: Nasal Mucosa; Nasal Swab  Result Value Ref Range Status   MRSA by PCR Next Gen NOT DETECTED NOT DETECTED Final    Comment: (NOTE) The GeneXpert MRSA Assay (FDA approved for NASAL specimens only), is one component of a comprehensive MRSA colonization surveillance program. It is not intended to diagnose MRSA infection nor to guide or monitor treatment for MRSA infections. Test performance is not FDA approved in patients less than 8 years old. Performed at [facility], [place] [address] [place], [place] [address]   Culture, blood (Routine  X 2) w Reflex to ID Panel     Status: None   Collection Time: 01/29/2022 11:18 AM   Specimen: BLOOD RIGHT HAND  Result Value Ref Range Status   Specimen Description BLOOD RIGHT HAND  Final   Special Requests   Final    BOTTLES DRAWN AEROBIC AND ANAEROBIC Blood Culture results may not be optimal due to an inadequate volume of blood received in culture bottles   Culture   Final    NO GROWTH 5 DAYS Performed at [facility], [place] [address] [place], [place] [address]    Report Status 01/20/2022 FINAL  Final  Culture, blood (Routine X 2) w Reflex to ID Panel     Status: None   Collection Time: 01/23/2022 11:30 AM   Specimen: BLOOD RIGHT HAND  Result Value Ref Range  Status   Specimen Description BLOOD RIGHT HAND  Final   Special Requests   Final    AEROBIC BOTTLE ONLY Blood Culture results may not be optimal due to an inadequate volume of blood received in culture bottles   Culture   Final    NO GROWTH 5 DAYS Performed at [facility], [place] [address], [place], [place] [address]    Report Status 01/20/2022 FINAL  Final      Radiology Studies: DG CHEST PORT 1 VIEW  Result Date: 01/22/2022 CLINICAL DATA:  Shortness of breath. EXAM: PORTABLE CHEST 1 VIEW COMPARISON:  January 15, 2022. FINDINGS: Feeding tube courses through in off the field of the radiograph tip in the upper abdomen. EKG leads project over the patient's chest.  Post extubation. Cardiomediastinal contours and hilar structures are stable. No signs of lobar consolidation. Improved appearance of airspace component of interstitial and airspace opacity seen on the prior study. No pneumothorax. On limited assessment no acute skeletal finding. IMPRESSION: Post extubation. Improved appearance of interstitial and airspace opacities since the prior study. Still with areas of patchy opacity in the RIGHT mid and LEFT mid chest in particular. No signs of lobar consolidation or gross effusion. Electronically Signed   By: Zetta Bills M.D.   On: 01/22/2022 07:24      LOS: 8 days    Cordelia Poche, MD [facility] 01/23/2022, 8:15 AM   If 7PM-7AM, please contact night-coverage [email or web address]

## 2022-01-23 NOTE — Progress Notes (Signed)
Speech Language Pathology Treatment: Dysphagia  Patient Details Name: LINKON SIVERSON MRN: [id number] DOB: 1932-04-05 Today's Date: 01/23/2022 Time: 0920-0930 SLP Time Calculation (min) (ACUTE ONLY): 10 min  Assessment / Plan / Recommendation Clinical Impression  Pt slow to awake, but eventually able to open eyes and follow some simple commands related to oral care and consumption of 2 ice chips. Pt with severe thick ropy secretions that SLP worked to remove with suction throughout session. Eventually pt did manipulate a piece of ice and initiate a swallow which elicited good retrieval of deeper secretions from oropharynx. Pt attempted phonation but completely aphonic. Pt too weak and lethargic for PO intake. Goal is for secretion management unless pt advances further.    HPI HPI: Patient is an 88 y.o. male with PMH: HTN, arthritis, CVA who was recently admitted for CVA with left sided hemiplegia, had eventually transferred to inpatient rehab, had Cortrak feeding tube placed 5 days ago, was found to be in acute respiratory distress morning of 01/03/2022 with CXR showing possible edema versus infiltrates. MRI showed right MCA infarct extended from recent infarct with infarcts involving right frontal operculum, middle frontal gyrus, left frontal lobe and right occipital lobe. Rapid response was called and patient had suction performed with fluids suctioned out appearing to be same as tube feedings; feedings were stopped. He was placed on BiPAP and admitted to ICU in setting of aspiration of tube feedings leading to acute respiratory failure with hypoxemia. EGD on 6/16 showed "EGD: gastric polyps noted, erythema of gastric antrum, no ulcer, food in esophagus and stomach".  He was intubated on arrival and self-extubated 6/19 with no plans to reintubate if needed as per [place].      SLP Plan  Continue with current plan of care;MBS      Recommendations for follow up therapy are one component of a  multi-disciplinary discharge planning process, led by the attending physician.  Recommendations may be updated based on patient status, additional functional criteria and insurance authorization.    Recommendations  Diet recommendations: NPO                Follow Up Recommendations: Skilled nursing-short term rehab (<3 hours/day) Plan: Continue with current plan of care;MBS           Ruger Saxer, Katherene Ponto  01/23/2022, 9:54 AM

## 2022-01-24 ENCOUNTER — Inpatient Hospital Stay (HOSPITAL_COMMUNITY): Payer: Medicare Other

## 2022-01-24 DIAGNOSIS — R0682 Tachypnea, not elsewhere classified: Secondary | ICD-10-CM

## 2022-01-24 LAB — BASIC METABOLIC PANEL
Anion gap: 13 (ref 5–15)
BUN: 53 mg/dL — ABNORMAL HIGH (ref 8–23)
CO2: 25 mmol/L (ref 22–32)
Calcium: 7.9 mg/dL — ABNORMAL LOW (ref 8.9–10.3)
Chloride: 105 mmol/L (ref 98–111)
Creatinine, Ser: 2.19 mg/dL — ABNORMAL HIGH (ref 0.61–1.24)
GFR, Estimated: 28 mL/min — ABNORMAL LOW (ref >60)
Glucose, Bld: 118 mg/dL — ABNORMAL HIGH (ref 70–99)
Potassium: 3.9 mmol/L (ref 3.5–5.1)
Sodium: 143 mmol/L (ref 135–145)

## 2022-01-24 LAB — CBC
HCT: 27.9 % — ABNORMAL LOW (ref 39.0–52.0)
Hemoglobin: 9 g/dL — ABNORMAL LOW (ref 13.0–17.0)
MCH: 28.5 pg (ref 26.0–34.0)
MCHC: 32.3 g/dL (ref 30.0–36.0)
MCV: 88.3 fL (ref 80.0–100.0)
Platelets: 239 10*3/uL (ref 150–400)
RBC: 3.16 MIL/uL — ABNORMAL LOW (ref 4.22–5.81)
RDW: 16.7 % — ABNORMAL HIGH (ref 11.5–15.5)
WBC: 9.7 10*3/uL (ref 4.0–10.5)
nRBC: 0 % (ref 0.0–0.2)

## 2022-01-24 LAB — GLUCOSE, CAPILLARY
Glucose-Capillary: 121 mg/dL — ABNORMAL HIGH (ref 70–99)
Glucose-Capillary: 101 mg/dL — ABNORMAL HIGH (ref 70–99)
Glucose-Capillary: 121 mg/dL — ABNORMAL HIGH (ref 70–99)
Glucose-Capillary: 111 mg/dL — ABNORMAL HIGH (ref 70–99)
Glucose-Capillary: 127 mg/dL — ABNORMAL HIGH (ref 70–99)

## 2022-01-24 LAB — PROCALCITONIN: Procalcitonin: 0.52 ng/mL

## 2022-01-24 LAB — D-DIMER, QUANTITATIVE: D-Dimer, Quant: 4.22 ug/mL-FEU — ABNORMAL HIGH (ref 0.00–0.50)

## 2022-01-24 MED ORDER — MORPHINE SULFATE (PF) 2 MG/ML IV SOLN
2.0000 mg | INTRAVENOUS | Status: DC | PRN
  Administered 2022-01-24 – 2022-01-25 (×5): 2 mg via INTRAVENOUS
  Filled 2022-01-24 (×5): qty 1

## 2022-01-24 NOTE — Progress Notes (Signed)
Pt vomited twice. Tube feeds stopped.  MD notified.  MD order to stop tube feeding for rest of the day.  Cortrak placement unaffected,  confirmed by X-ray, and reviewed by MD.

## 2022-01-24 NOTE — Progress Notes (Signed)
Speech Language Pathology Treatment: Dysphagia  Patient Details Name: Chad [name] MRN: [id number] DOB: 1931-11-29 Today's Date: 01/24/2022 Time: 6010-9323 SLP Time Calculation (min) (ACUTE ONLY): 11 min  Assessment / Plan / Recommendation Clinical Impression  Pt seen for ongoing dysphagia management. Pt was drowsy on SLP arrival with minimal alertness.  Secretion loss from L side of oral cavity.  RN reports copious thick secretions and has been suctioning.  NT reports suctioning pt this morning as well.  Rattling breath sounds noted on arrival.  Cough x1.  Pt with tachypnea reaching 43 at one time.  SLP provided oral care with suction.  Pt began chewing on suction catheter when introduced to oral cavity, but unable to achieve labial seal around suction.  During oral care pt vomited x2 without deep suctioning.  NT immediately in room and paused tube feeds.  Pt is not appropriate for PO trials at this time 2/2 vomiting.  Pt remains a high risk for aspiration.  NPO status will not eliminate aspiration risk as pt is at risk for aspiration of secretions and of emesis if vomiting continues.  SLP will continue to follow.  Consider goals of care discussion.  Recommend pt remain NPO with alternate means of nutrition, hydration, and medication.     HPI HPI: Patient is an 88 y.o. male with PMH: HTN, arthritis, CVA who was recently admitted for CVA with left sided hemiplegia, had eventually transferred to inpatient rehab, had Cortrak feeding tube placed 5 days ago, was found to be in acute respiratory distress morning of 01/15/22 with CXR showing possible edema versus infiltrates. MRI showed right MCA infarct extended from recent infarct with infarcts involving right frontal operculum, middle frontal gyrus, left frontal lobe and right occipital lobe. Rapid response was called and patient had suction performed with fluids suctioned out appearing to be same as tube feedings; feedings were stopped. He was placed  on BiPAP and admitted to ICU in setting of aspiration of tube feedings leading to acute respiratory failure with hypoxemia. EGD on 6/16 showed "EGD: gastric polyps noted, erythema of gastric antrum, no ulcer, food in esophagus and stomach".  He was intubated on arrival and self-extubated 6/19 with no plans to reintubate if needed as per GOC.      SLP Plan  Continue with current plan of care      Recommendations for follow up therapy are one component of a multi-disciplinary discharge planning process, led by the attending physician.  Recommendations may be updated based on patient status, additional functional criteria and insurance authorization.    Recommendations  Diet recommendations: NPO Medication Administration: Via alternative means                Oral Care Recommendations: Oral care QID;Staff/trained caregiver to provide oral care Follow Up Recommendations: Long-term institutional care without follow-up therapy Assistance recommended at discharge: Frequent or constant Supervision/Assistance SLP Visit Diagnosis: Dysphagia, unspecified (R13.10) Plan: Continue with current plan of care           Kerrie Pleasure, MA, CCC-SLP Acute Rehabilitation Services Office: [phone] 01/24/2022, 12:40 PM

## 2022-01-25 LAB — CBC
HCT: 27.9 % — ABNORMAL LOW (ref 39.0–52.0)
Hemoglobin: 9 g/dL — ABNORMAL LOW (ref 13.0–17.0)
MCH: 29.1 pg (ref 26.0–34.0)
MCHC: 32.3 g/dL (ref 30.0–36.0)
MCV: 90.3 fL (ref 80.0–100.0)
Platelets: 211 10*3/uL (ref 150–400)
RBC: 3.09 MIL/uL — ABNORMAL LOW (ref 4.22–5.81)
RDW: 17.2 % — ABNORMAL HIGH (ref 11.5–15.5)
WBC: 16 10*3/uL — ABNORMAL HIGH (ref 4.0–10.5)
nRBC: 0 % (ref 0.0–0.2)

## 2022-01-25 LAB — BASIC METABOLIC PANEL
Anion gap: 11 (ref 5–15)
BUN: 53 mg/dL — ABNORMAL HIGH (ref 8–23)
CO2: 24 mmol/L (ref 22–32)
Calcium: 8 mg/dL — ABNORMAL LOW (ref 8.9–10.3)
Chloride: 109 mmol/L (ref 98–111)
Creatinine, Ser: 2.02 mg/dL — ABNORMAL HIGH (ref 0.61–1.24)
GFR, Estimated: 31 mL/min — ABNORMAL LOW (ref >60)
Glucose, Bld: 91 mg/dL (ref 70–99)
Potassium: 4 mmol/L (ref 3.5–5.1)
Sodium: 144 mmol/L (ref 135–145)

## 2022-01-25 LAB — GLUCOSE, CAPILLARY
Glucose-Capillary: 106 mg/dL — ABNORMAL HIGH (ref 70–99)
Glucose-Capillary: 83 mg/dL (ref 70–99)
Glucose-Capillary: 100 mg/dL — ABNORMAL HIGH (ref 70–99)
Glucose-Capillary: 96 mg/dL (ref 70–99)
Glucose-Capillary: 95 mg/dL (ref 70–99)

## 2022-01-25 LAB — PROCALCITONIN: Procalcitonin: 0.55 ng/mL

## 2022-01-25 MED ORDER — FUROSEMIDE 10 MG/ML IJ SOLN
40.0000 mg | Freq: Once | INTRAMUSCULAR | Status: AC
  Administered 2022-01-25: 40 mg via INTRAVENOUS
  Filled 2022-01-25: qty 4

## 2022-01-25 MED ORDER — PIPERACILLIN-TAZOBACTAM IN DEX 2-0.25 GM/50ML IV SOLN
2.2500 g | Freq: Three times a day (TID) | INTRAVENOUS | Status: DC
  Administered 2022-01-25 – 2022-01-26 (×4): 2.25 g via INTRAVENOUS
  Filled 2022-01-25 (×7): qty 50

## 2022-01-25 NOTE — Progress Notes (Addendum)
PROGRESS NOTE    DERRYN APA  ZOX:[id number] DOB: 07-27-1932 DOA: 01/15/2022 PCP: Mila Palmer, MD   Brief Narrative:   ICU Course 6/14 Admit from CIR, intubated upon arrival to ICU, concern for GI bleed, shock 6/15 convulsive movements of R arm, started LTM EEG, head CT findings worrisome for new stroke 6/16 EGD >> gastric polyps noted, erythema of gastric antrum, no ulcer, food in esophagus and stomach 6/17 palliative care and pulmonary spoke to patient's family>  6/18 MRI Brain >> acute/subacute non-hemorrhagic infarct involving the right frontal operculum, right middle frontal gyrus, left frontal lobe and right occipital lobe, remote lacunar infarcts of the basal ganglia & thalami bilaterally & in cerebellum, atrophy and white matter disease moderately advanced for age 41/20 MRA Brain >> signal loss at the site of the previous right superior division M2 occlusion, flow is distal to signal loss.  This likely represents a residual high grade stenosis, otherwise normal MRA circle of Willis 6/19 Reports of vomiting yesterday, none overnight. TF restarted 0400.  Self extubated 6/19.  GOC discussed, no plans for reintubation, DNR, comfort care if decompensates 6/20: Had a large melanotic stool.  Hemoglobin remained stable. 6/21: Na 156>>FW increased. Resumed atenolol for rate control. Tx orders placed.  Floor Course 6/23: Worsening mental status/worsening respiratory status. Concern for continued aspiration  Assessment and Plan:  Acute respiratory failure with hypoxia Likely secondary to aspiration/pneumonia. Patient required intubation on 6/14 and self-extubated on 6/19. Weaned to room air. Resolved.  Aspiration pneumonia Patient completed a 5 day course of Unasyn.  Tachypnea Primary concern would be aspiration event overnight. Now with oxygen requirement. Unable to assess symptoms at this time secondary to patient presentation. Could also be secondary to fluid. Chest x-ray  with possible LLL pneumonia. D-dimer is elevated but patient with AKI. More likely etiology is aspiration +/- pneumonia -Lasix IV x1 -Empiric Zosyn IV  AKI on CKD stage IIIa Baseline creatinine of about 1.3 - 1.4. Creatinine of 3.84 on admission with peak of 3.86. Trending down.  Hypernatremia Secondary to decreased free water. Peak sodium of 150. Patient is managed via NG tube feeds. Increased to 300 mL of free water q4 hours. Sodium down to 146 today. -Continue free water  Anasarca Noted. Secondary to hypoalbuminemia.  Malnutrition Patient currently requiring Cortrak for tube feeds. -Hold tube feeds (6/23). May start IV fluids but currently diuresing. -Dietitian recommendations (6/20): Continue tube feeds via Cortrak: Advance Osmolite 1.5 by 10 ml q 6 hours to goal rate of 50 ml/hr (1200 ml/day) ProSource TF 45 ml daily Free water per [attending], currently 200 ml q 4 hours  Bladder outlet obstruction Foley catheter in place.  GI bleed Patient with multiple melanotic stool. Complicated by Brilinta and aspirin. Hemoglobin has remained stable. GI consulted and performed EGD on 6/16 without etiology for bleeding identified. Patient with epistaxis which may be contributing to melanotic stool. -Serial CBC/H&H  Acute CVA Patient with prior recent stroke. On current admission, patient with right MCA infarct that extended from recent infarct in setting of hypotension, complicated by known right ICA stenosis s/p angioplasty. MRI brain (6/18) confirms right frontal operculum with acute/subacute infarct extending into the perirolandic cortex in addition to punctate left frontal and right occipital infarct. MRA heard (6/18) significant for signal loss in right superior division with right M2 occlusion. Carotid ultrasound (6/18) significant for right stenosis of 1-39%; left carotid unable to evaluate secondary to patient movement. LDL of 90 from, hemoglobin A1C of 6.1% and transthoracic  echocardiogram significant for normal LVEF without atrial level shunt from prior admission. Neurology recommendations for aspirin 81 mg daily and Brilinta 90 mg BID. PT/OT recommendations for SNF. Poor prognosis secondary to dysphagia/aspiration issues. Discussed with palliative care and will continue family discussions.  Carotid stenosis Right ICA stenosis s/p angioplasty.  Convulsions Episodes in the ICU. LTM EEG ordered and was negative for active seizure activity.  Chronic diastolic heart failure Stable.  Diabetes mellitus, type 2 Hemoglobin A1C of 6.1% from 12/2021. Currently on SSI for tube feeds. -Continue SSI   DVT prophylaxis: Heparin subq Code Status:   Code Status: DNR Family Communication: None at bedside. Disposition Plan: Anticipate patient may not be able to discharge to SNF. Patient currently declining and will likely require transition to comfort measures   Consultants:  PCCM Neurology Gastroenterology Palliative care medicine  Procedures:  Intubation (6/14 >> 6/19) LTM EEG (6/15 >> 6/16; 6/16 >> 6/17) EGD (6/16) Cortrak (6/16>>  Antimicrobials: Unasyn IV    Subjective: Patient with continued tachypnea. Decreased arousal. Continued secretions.  Objective: BP 138/60 (BP Location: Right Arm)   Pulse 76   Temp 98.2 F (36.8 C) (Oral)   Resp (!) 28   Wt 57.2 kg   SpO2 97%   BMI 23.81 kg/m   Examination:  General exam: Appears calm and comfortable Respiratory system: Significant rhonchi bilaterally with some transmitted upper airway sounds. Tachypnea. Cardiovascular system: S1 & S2 heard, RRR. No murmurs. Gastrointestinal system: Abdomen is nondistended, soft and nontender. No organomegaly or masses felt. Normal bowel sounds heard. Central nervous system: Not alert. Difficult to arouse and not oriented. Musculoskeletal: No edema. No calf tenderness   Data Reviewed: I have personally reviewed following labs and imaging studies  CBC Lab  Results  Component Value Date   WBC 9.7 01/24/2022   RBC 3.16 (L) 01/24/2022   HGB 9.0 (L) 01/24/2022   HCT 27.9 (L) 01/24/2022   MCV 88.3 01/24/2022   MCH 28.5 01/24/2022   PLT 239 01/24/2022   MCHC 32.3 01/24/2022   RDW 16.7 (H) 01/24/2022   LYMPHSABS 1.3 01/16/2022   MONOABS 5.0 (H) 01/16/2022   EOSABS 0.0 01/16/2022   BASOSABS 0.0 01/16/2022     Last metabolic panel Lab Results  Component Value Date   NA 143 01/24/2022   K 3.9 01/24/2022   CL 105 01/24/2022   CO2 25 01/24/2022   BUN 53 (H) 01/24/2022   CREATININE 2.19 (H) 01/24/2022   GLUCOSE 118 (H) 01/24/2022   GFRNONAA 28 (L) 01/24/2022   GFRAA 54 (L) 04/01/2013   CALCIUM 7.9 (L) 01/24/2022   PHOS 4.3 01/20/2022   PROT 6.6 01/21/2022   ALBUMIN 2.1 (L) 01/21/2022   BILITOT 1.0 01/21/2022   ALKPHOS 63 01/21/2022   AST 23 01/21/2022   ALT 15 01/21/2022   ANIONGAP 13 01/24/2022    GFR: Estimated Creatinine Clearance: 17.2 mL/min (A) (by C-G formula based on SCr of 2.19 mg/dL (H)).  Recent Results (from the past 240 hour(s))  Urine Culture     Status: None   Collection Time: 01/15/22 10:27 AM   Specimen: Urine, Catheterized  Result Value Ref Range Status   Specimen Description URINE, CATHETERIZED  Final   Special Requests NONE  Final   Culture   Final    NO GROWTH Performed at [facility], [address], [place], [place] [address]    Report Status 01/16/2022 FINAL  Final  MRSA Next Gen by PCR, Nasal     Status: None  Collection Time: 02/02/2022 10:29 AM   Specimen: Nasal Mucosa; Nasal Swab  Result Value Ref Range Status   MRSA by PCR Next Gen NOT DETECTED NOT DETECTED Final    Comment: (NOTE) The GeneXpert MRSA Assay (FDA approved for NASAL specimens only), is one component of a comprehensive MRSA colonization surveillance program. It is not intended to diagnose MRSA infection nor to guide or monitor treatment for MRSA infections. Test performance is not FDA approved in patients less than 31  years old. Performed at [facility], [address] [place], [place] [address]   Culture, blood (Routine X 2) w Reflex to ID Panel     Status: None   Collection Time: February 02, 2022 11:18 AM   Specimen: BLOOD RIGHT HAND  Result Value Ref Range Status   Specimen Description BLOOD RIGHT HAND  Final   Special Requests   Final    BOTTLES DRAWN AEROBIC AND ANAEROBIC Blood Culture results may not be optimal due to an inadequate volume of blood received in culture bottles   Culture   Final    NO GROWTH 5 DAYS Performed at [facility], [address] [place], [place] [address]    Report Status 01/20/2022 FINAL  Final  Culture, blood (Routine X 2) w Reflex to ID Panel     Status: None   Collection Time: 02-02-2022 11:30 AM   Specimen: BLOOD RIGHT HAND  Result Value Ref Range Status   Specimen Description BLOOD RIGHT HAND  Final   Special Requests   Final    AEROBIC BOTTLE ONLY Blood Culture results may not be optimal due to an inadequate volume of blood received in culture bottles   Culture   Final    NO GROWTH 5 DAYS Performed at [facility], [address], [place], [place] [address]    Report Status 01/20/2022 FINAL  Final      Radiology Studies: DG CHEST PORT 1 VIEW  Result Date: 01/24/2022 CLINICAL DATA:  Tachypnea EXAM: PORTABLE CHEST 1 VIEW COMPARISON:  Previous studies including the examination of 01/22/2022 FINDINGS: Transverse diameter of heart is increased. There is increased density with air bronchogram in the left lower lung fields. Rest of the lung fields are clear. There are no signs of alveolar pulmonary edema. Enteric tube is noted traversing the esophagus with its tip in the antrum of the stomach. IMPRESSION: Cardiomegaly. Increased density in the left lower lung fields suggests atelectasis/pneumonia in the left lower lobe. Electronically Signed   By: Ernie Avena M.D.   On: 01/24/2022 13:28      LOS: 10 days    Jacquelin Hawking, MD [facility] 01/25/2022, 9:04 AM   If 7PM-7AM, please contact night-coverage [email or web address]

## 2022-01-26 LAB — GLUCOSE, CAPILLARY
Glucose-Capillary: 100 mg/dL — ABNORMAL HIGH (ref 70–99)
Glucose-Capillary: 104 mg/dL — ABNORMAL HIGH (ref 70–99)
Glucose-Capillary: 105 mg/dL — ABNORMAL HIGH (ref 70–99)
Glucose-Capillary: 95 mg/dL (ref 70–99)
Glucose-Capillary: 98 mg/dL (ref 70–99)

## 2022-01-26 LAB — PROCALCITONIN: Procalcitonin: 1.03 ng/mL

## 2022-01-26 MED ORDER — HALOPERIDOL LACTATE 2 MG/ML PO CONC: 0.5000 mg | ORAL | Status: DC | PRN

## 2022-01-26 MED ORDER — HYDROMORPHONE HCL 1 MG/ML IJ SOLN
0.5000 mg | INTRAMUSCULAR | Status: DC | PRN
  Administered 2022-01-26: 0.5 mg via INTRAVENOUS
  Administered 2022-01-27: 1 mg via INTRAVENOUS
  Administered 2022-01-27: 0.5 mg via INTRAVENOUS
  Administered 2022-01-28 – 2022-01-29 (×4): 1 mg via INTRAVENOUS
  Filled 2022-01-26: qty 1
  Filled 2022-01-26 (×2): qty 0.5
  Filled 2022-01-26 (×5): qty 1

## 2022-01-26 MED ORDER — LORAZEPAM 2 MG/ML IJ SOLN: 1.0000 mg | INTRAMUSCULAR | Status: DC | PRN

## 2022-01-26 MED ORDER — HALOPERIDOL LACTATE 5 MG/ML IJ SOLN: 0.5000 mg | INTRAMUSCULAR | Status: DC | PRN

## 2022-01-26 MED ORDER — BIOTENE DRY MOUTH MT LIQD: 15.0000 mL | OROMUCOSAL | Status: DC | PRN

## 2022-01-26 MED ORDER — LORAZEPAM 2 MG/ML PO CONC: 1.0000 mg | ORAL | Status: DC | PRN

## 2022-01-26 MED ORDER — GLYCOPYRROLATE 0.2 MG/ML IJ SOLN
0.4000 mg | INTRAMUSCULAR | Status: DC | PRN
  Administered 2022-01-27 – 2022-01-29 (×6): 0.4 mg via INTRAVENOUS
  Filled 2022-01-26 (×6): qty 2

## 2022-01-26 MED ORDER — GLYCOPYRROLATE 1 MG PO TABS: 1.0000 mg | ORAL_TABLET | ORAL | Status: DC | PRN

## 2022-01-26 MED ORDER — LORAZEPAM 1 MG PO TABS: 1.0000 mg | ORAL_TABLET | ORAL | Status: DC | PRN

## 2022-01-26 MED ORDER — GLYCOPYRROLATE 0.2 MG/ML IJ SOLN
0.2000 mg | INTRAMUSCULAR | Status: DC | PRN
  Filled 2022-01-26: qty 1

## 2022-01-26 MED ORDER — POLYVINYL ALCOHOL 1.4 % OP SOLN: 1.0000 [drp] | Freq: Four times a day (QID) | OPHTHALMIC | Status: DC | PRN

## 2022-01-26 MED ORDER — HALOPERIDOL 0.5 MG PO TABS: 0.5000 mg | ORAL_TABLET | ORAL | Status: DC | PRN

## 2022-01-26 NOTE — Progress Notes (Signed)
PROGRESS NOTE    Chad [name]  ZOX:[id number] DOB: 1932/04/04 DOA: 01/15/2022 PCP: Mila Palmer, MD   Brief Narrative:   ICU Course 6/14 Admit from CIR, intubated upon arrival to ICU, concern for GI bleed, shock 6/15 convulsive movements of R arm, started LTM EEG, head CT findings worrisome for new stroke 6/16 EGD >> gastric polyps noted, erythema of gastric antrum, no ulcer, food in esophagus and stomach 6/17 palliative care and pulmonary spoke to patient's family>  6/18 MRI Brain >> acute/subacute non-hemorrhagic infarct involving the right frontal operculum, right middle frontal gyrus, left frontal lobe and right occipital lobe, remote lacunar infarcts of the basal ganglia & thalami bilaterally & in cerebellum, atrophy and white matter disease moderately advanced for age 57/20 MRA Brain >> signal loss at the site of the previous right superior division M2 occlusion, flow is distal to signal loss.  This likely represents a residual high grade stenosis, otherwise normal MRA circle of Willis 6/19 Reports of vomiting yesterday, none overnight. TF restarted 0400.  Self extubated 6/19.  GOC discussed, no plans for reintubation, DNR, comfort care if decompensates 6/20: Had a large melanotic stool.  Hemoglobin remained stable. 6/21: Na 156>>FW increased. Resumed atenolol for rate control. Tx orders placed.  Floor Course 6/23: Worsening mental status/worsening respiratory status. Concern for continued aspiration. 6/24: Leukocytosis, worsening oxygen requirement. Chest x-ray concerning for aspiration. Zosyn IV started. Continued goals of care discussions for transition to comfort measures  Assessment and Plan:  Acute respiratory failure with hypoxia Likely secondary to aspiration/pneumonia. Patient required intubation on 6/14 and self-extubated on 6/19. Weaned to room air. Resolved.  Aspiration pneumonia Patient completed a 5 day course of Unasyn. New course of Zosyn IV started on  6/24. -Continue Zosyn IV  Tachypnea Primary concern would be aspiration event overnight. Now with oxygen requirement. Unable to assess symptoms at this time secondary to patient presentation. Could also be secondary to fluid. Chest x-ray with possible LLL pneumonia. D-dimer is elevated but patient with AKI. More likely etiology is aspiration +/- pneumonia.  AKI on CKD stage IIIa Baseline creatinine of about 1.3 - 1.4. Creatinine of 3.84 on admission with peak of 3.86. Trending down.  Hypernatremia Secondary to decreased free water. Peak sodium of 150. Patient is managed via NG tube feeds. Increased to 300 mL of free water q4 hours. Sodium down to 146 today. -Continue free water  Anasarca Noted. Secondary to hypoalbuminemia.  Malnutrition Patient currently requiring Cortrak for tube feeds. -Hold tube feeds (6/23). May start IV fluids but currently diuresing. -Dietitian recommendations (6/20): Continue tube feeds via Cortrak: Advance Osmolite 1.5 by 10 ml q 6 hours to goal rate of 50 ml/hr (1200 ml/day) ProSource TF 45 ml daily Free water per [attending], currently 200 ml q 4 hours  Bladder outlet obstruction Foley catheter in place.  GI bleed Patient with multiple melanotic stool. Complicated by Brilinta and aspirin. Hemoglobin has remained stable. GI consulted and performed EGD on 6/16 without etiology for bleeding identified. Patient with epistaxis which may be contributing to melanotic stool. Hemoglobin stable.  Acute CVA Patient with prior recent stroke. On current admission, patient with right MCA infarct that extended from recent infarct in setting of hypotension, complicated by known right ICA stenosis s/p angioplasty. MRI brain (6/18) confirms right frontal operculum with acute/subacute infarct extending into the perirolandic cortex in addition to punctate left frontal and right occipital infarct. MRA heard (6/18) significant for signal loss in right superior division with  right M2 occlusion.  Carotid ultrasound (6/18) significant for right stenosis of 1-39%; left carotid unable to evaluate secondary to patient movement. LDL of 90 from, hemoglobin A1C of 6.1% and transthoracic echocardiogram significant for normal LVEF without atrial level shunt from prior admission. Neurology recommendations for aspirin 81 mg daily and Brilinta 90 mg BID. PT/OT recommendations for SNF. Poor prognosis secondary to dysphagia/aspiration issues. Discussed with palliative care and will continue family discussions.  Carotid stenosis Right ICA stenosis s/p angioplasty.  Convulsions Episodes in the ICU. LTM EEG ordered and was negative for active seizure activity.  Chronic diastolic heart failure Stable.  Diabetes mellitus, type 2 Hemoglobin A1C of 6.1% from 12/2021. Currently on SSI for tube feeds. -Continue SSI   DVT prophylaxis: Heparin subq Code Status:   Code Status: DNR Family Communication: Daughter at bedside. Disposition Plan: Anticipate patient may not be able to discharge to SNF. Patient currently declining and will likely require transition to comfort measures   Consultants:  PCCM Neurology Gastroenterology Palliative care medicine  Procedures:  Intubation (6/14 >> 6/19) LTM EEG (6/15 >> 6/16; 6/16 >> 6/17) EGD (6/16) Cortrak (6/16>>  Antimicrobials: Unasyn IV  Zosyn IV   Subjective: Continued tachypnea and secretions. Not alert. Does not follow commands.  Objective: BP (!) 131/59   Pulse 66   Temp 97.9 F (36.6 C) (Oral)   Resp (!) 31   Wt 57.2 kg   SpO2 97%   BMI 23.81 kg/m   Examination:  General exam: Appears calm Respiratory system: Tachypnea with increased effort Gastrointestinal system: Abdomen is non-distended Central nervous system: Not alert.   Data Reviewed: I have personally reviewed following labs and imaging studies  CBC Lab Results  Component Value Date   WBC 16.0 (H) 01/25/2022   RBC 3.09 (L) 01/25/2022   HGB 9.0  (L) 01/25/2022   HCT 27.9 (L) 01/25/2022   MCV 90.3 01/25/2022   MCH 29.1 01/25/2022   PLT 211 01/25/2022   MCHC 32.3 01/25/2022   RDW 17.2 (H) 01/25/2022   LYMPHSABS 1.3 01/16/2022   MONOABS 5.0 (H) 01/16/2022   EOSABS 0.0 01/16/2022   BASOSABS 0.0 01/16/2022     Last metabolic panel Lab Results  Component Value Date   NA 144 01/25/2022   K 4.0 01/25/2022   CL 109 01/25/2022   CO2 24 01/25/2022   BUN 53 (H) 01/25/2022   CREATININE 2.02 (H) 01/25/2022   GLUCOSE 91 01/25/2022   GFRNONAA 31 (L) 01/25/2022   GFRAA 54 (L) 04/01/2013   CALCIUM 8.0 (L) 01/25/2022   PHOS 4.3 01/20/2022   PROT 6.6 01/21/2022   ALBUMIN 2.1 (L) 01/21/2022   BILITOT 1.0 01/21/2022   ALKPHOS 63 01/21/2022   AST 23 01/21/2022   ALT 15 01/21/2022   ANIONGAP 11 01/25/2022    GFR: Estimated Creatinine Clearance: 18.7 mL/min (A) (by C-G formula based on SCr of 2.02 mg/dL (H)).  No results found for this or any previous visit (from the past 240 hour(s)).     Radiology Studies: No results found.    LOS: 11 days    Jacquelin Hawking, MD [facility] 01/26/2022, 1:30 PM   If 7PM-7AM, please contact night-coverage [email or web address]

## 2022-01-27 DIAGNOSIS — E870 Hyperosmolality and hypernatremia: Secondary | ICD-10-CM | POA: Diagnosis present

## 2022-01-27 DIAGNOSIS — I639 Cerebral infarction, unspecified: Secondary | ICD-10-CM | POA: Diagnosis present

## 2022-01-27 DIAGNOSIS — Z515 Encounter for palliative care: Secondary | ICD-10-CM

## 2022-01-27 DIAGNOSIS — R0682 Tachypnea, not elsewhere classified: Secondary | ICD-10-CM

## 2022-01-27 DIAGNOSIS — K921 Melena: Secondary | ICD-10-CM | POA: Diagnosis present

## 2022-01-27 DIAGNOSIS — N320 Bladder-neck obstruction: Secondary | ICD-10-CM | POA: Diagnosis present

## 2022-01-28 MED ORDER — ORAL CARE MOUTH RINSE: 15.0000 mL | OROMUCOSAL | Status: DC | PRN

## 2022-01-28 MED ORDER — ORAL CARE MOUTH RINSE
15.0000 mL | OROMUCOSAL | Status: DC
  Administered 2022-01-28 – 2022-01-29 (×7): 15 mL via OROMUCOSAL

## 2022-01-28 NOTE — Progress Notes (Signed)
Patient XB:[id number] Chad [name]      DOB: Feb 22, 1932      NUU:[id number]      Palliative Medicine Team    Subjective: Bedside symptom check completed. No family or visitors present at time of visit.   Physical exam: Patient resting in bed with eyes closed at time of visit. Breathing displaying cheyne-stokes pattern, slightly labored, no excessive secretions noted. Patient without physical or non-verbal signs of pain or discomfort at this time. This RN turned off oxygen (1.5L) and remained bedside to ensure no distress resulted. Bedside RN Cecille Aver made aware. Patient remained comfortable appearing, not acknowledging this RN bedside.    Assessment and plan: Patient nearing EOL, anticipate in hospital death. Bedside RN without any needs or concerns today. Will continue to follow for any changes or advances.    Thank you for allowing the Palliative Medicine Team to assist in the care of this patient.     Shelda Jakes, MSN, RN Palliative Medicine Team Team Phone: [phone]  This phone is monitored 7a-7p, please reach out to attending physician outside of these hours for urgent needs.

## 2022-01-28 NOTE — Progress Notes (Signed)
PROGRESS NOTE    Chad [name]  LOV:[id number] DOB: 1931-08-20 DOA: 2022/02/11 PCP: Mila Palmer, MD   Brief Narrative: Chad [name] is a 88 y.o. male with a history of hypertension, stroke, carotid artery stenosis.  Patient presented from inpatient rehab secondary to acute respiratory distress concerning for aspiration pneumonia with resultant respiratory failure requiring ICU admission and intubation.  Patient self extubated and was able to transfer out of the ICU.  Patient with evidence of likely continued aspiration resulting in recurrent and worsening respiratory failure.  Patient eventually transition to comfort measures.  ICU Course 6/14 Admit from CIR, intubated upon arrival to ICU, concern for GI bleed, shock 6/15 convulsive movements of R arm, started LTM EEG, head CT findings worrisome for new stroke 6/16 EGD >> gastric polyps noted, erythema of gastric antrum, no ulcer, food in esophagus and stomach 6/17 palliative care and pulmonary spoke to patient's family>  6/18 MRI Brain >> acute/subacute non-hemorrhagic infarct involving the right frontal operculum, right middle frontal gyrus, left frontal lobe and right occipital lobe, remote lacunar infarcts of the basal ganglia & thalami bilaterally & in cerebellum, atrophy and white matter disease moderately advanced for age 22/18 MRA Brain >> signal loss at the site of the previous right superior division M2 occlusion, flow is distal to signal loss.  This likely represents a residual high grade stenosis, otherwise normal MRA circle of Willis 6/19 Reports of vomiting yesterday, none overnight. TF restarted 0400.  Self extubated 6/19.  GOC discussed, no plans for reintubation, DNR, comfort care if decompensates 6/20: Had a large melanotic stool.  Hemoglobin remained stable. 6/21: Na 156>>FW increased. Resumed atenolol for rate control. Tx orders placed.  Floor Course 6/23: Worsening mental status/worsening respiratory status.  Concern for continued aspiration. 6/24: Leukocytosis, worsening oxygen requirement. Chest x-ray concerning for aspiration. Zosyn IV started. Continued goals of care discussions for transition to comfort measures 6/25: Full comfort measures  Assessment and Plan:  Acute respiratory failure with hypoxia Likely secondary to aspiration/pneumonia. Patient required intubation on 6/14 and self-extubated on 6/19. Weaned to room air. Resolved.  Aspiration pneumonia Patient completed a 5 day course of Unasyn. New course of Zosyn IV started on 6/24. Patient transitioned to comfort measures. Zosyn discontinued.  Tachypnea Primary concern would be aspiration event overnight. Now with oxygen requirement. Unable to assess symptoms at this time secondary to patient presentation. Could also be secondary to fluid. Chest x-ray with possible LLL pneumonia. D-dimer is elevated but patient with AKI. More likely etiology is aspiration +/- pneumonia.  AKI on CKD stage IIIa Baseline creatinine of about 1.3 - 1.4. Creatinine of 3.84 on admission with peak of 3.86. Trending down.  Hypernatremia Secondary to decreased free water. Peak sodium of 150. Patient is managed via NG tube feeds. Increased to 300 mL of free water q4 hours. Sodium down to 144.  Anasarca Noted. Secondary to hypoalbuminemia.  Malnutrition Patient required Cortrak for tube feeds. Now transitioned to comfort measures. Cortrak tube removed on 6/25.  Bladder outlet obstruction Foley catheter in place.  Melena Patient with multiple melanotic stool. Complicated by Brilinta and aspirin. Hemoglobin has remained stable. GI consulted and performed EGD on 6/16 without etiology for bleeding identified. Patient with epistaxis which may be contributing to melanotic stool. Hemoglobin stable.  Acute CVA Patient with prior recent stroke. On current admission, patient with right MCA infarct that extended from recent infarct in setting of hypotension,  complicated by known right ICA stenosis s/p angioplasty. MRI brain (6/18) confirms  right frontal operculum with acute/subacute infarct extending into the perirolandic cortex in addition to punctate left frontal and right occipital infarct. MRA heard (6/18) significant for signal loss in right superior division with right M2 occlusion. Carotid ultrasound (6/18) significant for right stenosis of 1-39%; left carotid unable to evaluate secondary to patient movement. LDL of 90 from, hemoglobin A1C of 6.1% and transthoracic echocardiogram significant for normal LVEF without atrial level shunt from prior admission. Neurology recommendations for aspirin 81 mg daily and Brilinta 90 mg BID. PT/OT recommendations for SNF. Poor prognosis secondary to dysphagia/aspiration issues. Discussed with palliative care and will continue family discussions.  Carotid stenosis Right ICA stenosis s/p angioplasty.  Convulsions Episodes in the ICU. LTM EEG ordered and was negative for active seizure activity.  Chronic diastolic heart failure Stable.  Diabetes mellitus, type 2 Hemoglobin A1C of 6.1% from 12/2021. Currently on SSI for tube feeds.   DVT prophylaxis: Heparin subq Code Status:   Code Status: DNR Family Communication: None at bedside. Disposition Plan: Anticipate in-hospital death vs consideration for hospice facility   Consultants:  PCCM Neurology Gastroenterology Palliative care medicine  Procedures:  Intubation (6/14 >> 6/19) LTM EEG (6/15 >> 6/16; 6/16 >> 6/17) EGD (6/16) Cortrak (6/16>>  Antimicrobials: Unasyn IV  Zosyn IV   Subjective: No note of issues overnight. Patient appears comfortable. Patient reached his hand out; unsure of what his intention was, but I held it. Patient mouthing something but could not understand and unsure if this was purposeful or not.  Objective: BP (!) 166/63 (BP Location: Right Arm)   Pulse 76   Temp 98.7 F (37.1 C) (Oral)   Resp 15   Wt 57.2 kg    SpO2 99%   BMI 23.81 kg/m   Examination:  General exam: Appears calm and comfortable Respiratory system: Rhonchi/rales heard bilaterally. Cardiovascular system: S1 & S2 heard Central nervous system: Alert and non-oriented. Gaze appears to be fixed to the right. Musculoskeletal: No edema. No calf tenderness    Data Reviewed: I have personally reviewed following labs and imaging studies  CBC Lab Results  Component Value Date   WBC 16.0 (H) 01/25/2022   RBC 3.09 (L) 01/25/2022   HGB 9.0 (L) 01/25/2022   HCT 27.9 (L) 01/25/2022   MCV 90.3 01/25/2022   MCH 29.1 01/25/2022   PLT 211 01/25/2022   MCHC 32.3 01/25/2022   RDW 17.2 (H) 01/25/2022   LYMPHSABS 1.3 01/16/2022   MONOABS 5.0 (H) 01/16/2022   EOSABS 0.0 01/16/2022   BASOSABS 0.0 01/16/2022     Last metabolic panel Lab Results  Component Value Date   NA 144 01/25/2022   K 4.0 01/25/2022   CL 109 01/25/2022   CO2 24 01/25/2022   BUN 53 (H) 01/25/2022   CREATININE 2.02 (H) 01/25/2022   GLUCOSE 91 01/25/2022   GFRNONAA 31 (L) 01/25/2022   GFRAA 54 (L) 04/01/2013   CALCIUM 8.0 (L) 01/25/2022   PHOS 4.3 01/20/2022   PROT 6.6 01/21/2022   ALBUMIN 2.1 (L) 01/21/2022   BILITOT 1.0 01/21/2022   ALKPHOS 63 01/21/2022   AST 23 01/21/2022   ALT 15 01/21/2022   ANIONGAP 11 01/25/2022    GFR: Estimated Creatinine Clearance: 18.7 mL/min (A) (by C-G formula based on SCr of 2.02 mg/dL (H)).  No results found for this or any previous visit (from the past 240 hour(s)).     Radiology Studies: No results found.    LOS: 13 days    Jacquelin Hawking, MD [facility]  01/28/2022, 12:55 PM   If 7PM-7AM, please contact night-coverage [email or web address]

## 2022-01-29 DIAGNOSIS — J690 Pneumonitis due to inhalation of food and vomit: Secondary | ICD-10-CM | POA: Diagnosis present

## 2022-01-29 DIAGNOSIS — Z515 Encounter for palliative care: Secondary | ICD-10-CM

## 2022-01-29 DIAGNOSIS — E440 Moderate protein-calorie malnutrition: Secondary | ICD-10-CM | POA: Diagnosis present

## 2022-01-29 DIAGNOSIS — I5032 Chronic diastolic (congestive) heart failure: Secondary | ICD-10-CM | POA: Diagnosis present

## 2022-01-29 DIAGNOSIS — N170 Acute kidney failure with tubular necrosis: Secondary | ICD-10-CM | POA: Diagnosis present

## 2022-01-29 NOTE — Plan of Care (Signed)

## 2022-01-29 NOTE — Hospital Course (Signed)
88 year old male with past medical history of HTN, CVA, with left-sided hemiplegia, carotid stenosis, dysphagia on PEG tube presents from CIR due to respiratory distress. Initially was admitted to the hospitalist service on BiPAP but required transfer to the ICU.  6/14 Admit from CIR, intubated upon arrival to ICU, concern for GI bleed, shock 6/15 convulsive movements of R arm, started LTM EEG, head CT findings worrisome for new stroke 6/16 EGD -gastric polyps noted, erythema of gastric antrum, no ulcer, food in esophagus and stomach 6/17 palliative care and pulmonary spoke to patient's family>  6/18 MRI Brain -acute/subacute non-hemorrhagic infarct involving the right frontal operculum, right middle frontal gyrus, left frontal lobe and right occipital lobe, remote lacunar infarcts of the basal ganglia & thalami bilaterally & in cerebellum, atrophy and white matter disease moderately advanced for age 29/18 MRA Brain >> signal loss at the site of the previous right superior division M2 occlusion, flow is distal to signal loss.  This likely represents a residual high grade stenosis, otherwise normal MRA circle of Willis Self extubated 6/19.  GOC discussed, no plans for reintubation, DNR, comfort care if decompensates 6/21: Na 156>>FW increased. Resumed atenolol for rate control. Tx orders placed. 6/23: Worsening mental status/worsening respiratory status. Concern for continued aspiration. 6/24: Leukocytosis, worsening oxygen requirement. Chest x-ray concerning for aspiration. Zosyn IV started. Continued goals of care discussions for transition to comfort measures 6/25: Full comfort measures

## 2022-01-29 NOTE — Progress Notes (Signed)
Progress Note Patient: Chad [name] [id number] DOB: [birth year] DOA: 01/11/2022  DOS: the patient was seen and examined on 01/29/2022  Brief hospital course: 88 year old male with past medical history of HTN, CVA, with left-sided hemiplegia, carotid stenosis, dysphagia on PEG tube presents from CIR due to respiratory distress. Initially was admitted to the hospitalist service on BiPAP but required transfer to the ICU.  6/14 Admit from CIR, intubated upon arrival to ICU, concern for GI bleed, shock 6/15 convulsive movements of R arm, started LTM EEG, head CT findings worrisome for new stroke 6/16 EGD -gastric polyps noted, erythema of gastric antrum, no ulcer, food in esophagus and stomach 6/17 palliative care and pulmonary spoke to patient's family>  6/18 MRI Brain -acute/subacute non-hemorrhagic infarct involving the right frontal operculum, right middle frontal gyrus, left frontal lobe and right occipital lobe, remote lacunar infarcts of the basal ganglia & thalami bilaterally & in cerebellum, atrophy and white matter disease moderately advanced for age 49/18 MRA Brain >> signal loss at the site of the previous right superior division M2 occlusion, flow is distal to signal loss.  This likely represents a residual high grade stenosis, otherwise normal MRA circle of Willis Self extubated 6/19.  GOC discussed, no plans for reintubation, DNR, comfort care if decompensates 6/21: Na 156>>FW increased. Resumed atenolol for rate control. Tx orders placed. 6/23: Worsening mental status/worsening respiratory status. Concern for continued aspiration. 6/24: Leukocytosis, worsening oxygen requirement. Chest x-ray concerning for aspiration. Zosyn IV started. Continued goals of care discussions for transition to comfort measures 6/25: Full comfort measures Assessment and Plan: Principal Problem:   Acute respiratory failure with hypoxia (HCC) Active Problems:   Septic shock (HCC)   Aspiration pneumonia  ([place])   Internal carotid artery stenosis, right   Acute CVA (cerebrovascular accident) ([place])   Melena   Acute renal failure superimposed on stage 3a chronic kidney disease (HCC)   Hyperkalemia   Hypernatremia   Bladder outlet obstruction   ATN (acute tubular necrosis) (HCC)   Protein-calorie malnutrition, moderate (HCC)   Chronic heart failure with preserved ejection fraction (HFpEF)    Essential hypertension   Malnutrition of moderate degree   Hyperlipidemia   Comfort measures only status   Palliative care by specialist   Continue comfort care as per prior discussion.  While initial discussion ended with anticipation for hospital death I suspect that patient may be a good candidate for residential hospice placement. Will monitor and discussed with palliative care for further plan.  Subjective: Nonverbal.  Open his eyes when I enter the room but went back to sleep again.  Moving his right extremity.  Appears comfortable.  Physical Exam: Vitals:   01/27/22 1552 01/28/22 0601 01/29/22 0421 01/29/22 1604  BP: (!) 155/61 (!) 166/63 (!) 149/93 (!) 159/71  Pulse: 77 76 93 82  Resp: 17 15 19 20   Temp: 98.1 F (36.7 C) 98.7 F (37.1 C) 98.7 F (37.1 C) 97.6 F (36.4 C)  TempSrc: Oral Oral Oral Oral  SpO2: 99% 99% 93% (!) 89%  Weight:       General: Appear in mild distress; no visible Abnormal Neck Mass Or lumps, Conjunctiva normal Cardiovascular: S1 and S2 Present, no Murmur, Respiratory: increased respiratory effort, Bilateral Air entry present and faint crackles, no wheezes Abdomen: Bowel Sound present, Extremities: no Pedal edema Neurology: lethargic and non verbal, chronic left hemiplegia Gait not checked due to patient safety concerns   Data Reviewed:  Family Communication: None at bedside  Disposition: Status is: Inpatient  Remains inpatient appropriate because: Currently comfort care, anticipating hospital death  Author: Berle Mull, MD 01/29/2022 6:35  PM  Please look on [email or web address] to find out who is on call.

## 2022-01-29 NOTE — Progress Notes (Signed)
Patient HC:[id number] GOR VESTAL      DOB: [birth year]      DVV:[id number]      Palliative Medicine Team    Subjective: Bedside symptom check completed. No family or visitors bedside at time of visit.   Physical exam: Patient resting in bed with eyes closed at time of visit. Breathing rate cyclical with cheyne-stokes pattern ongoing, non-labored, no excessive secretions noted. Patient without distress on room air this morning. Extremities warm to touch, strong distal pulses noted. Patient without physical or non-verbal signs of pain or discomfort at this time. Patient does not acknowledge or open eyes to verbal/light tactile cues. This RN did not attempt to further stimulate patient, to promote comfort.    Assessment and plan: Patient remains with symptoms of EOL, anticipate in hospital death. No needs or concerns identified this morning. Will continue to follow for any changes or advances.    Thank you for allowing the Palliative Medicine Team to assist in the care of this patient.     Damian Leavell, MSN, RN Palliative Medicine Team Team Phone: [phone]  This phone is monitored 7a-7p, please reach out to attending physician outside of these hours for urgent needs.

## 2022-02-01 NOTE — Progress Notes (Signed)
   2022-02-14 0300  Attending [place]  Attending Physician Notified Y  Attending Physician (First and Last Name) J. Olena Heckle NP  Post Mortem Checklist  Date of Death Feb 14, 2022  Time of Death 3  Pronounced By [facility] BSN RN / Kalman Shan RN  Next of kin notified Yes  Name of next of kin notified of death April Quinn  Contact Person's Relationship to Patient Daughter  Contact Person's Phone Number [phone]  Family Communication Notes April is making a call to her brother to confirm funeral home arrangements.  She will call back once she is able to talk to him.  Was the patient a No Code Blue or a Limited Code Blue? Yes  Did the patient die unattended? Yes  Patient restrained? Not applicable  Body preparation complete Y  [employer] (previously known as [employer])  Notification Date 02/14/2022  Notification Time 0310  [employer] Number [phone] Sharol Given)  Is patient a potential donor? N  Autopsy  Autopsy requested by N/A  Medical Examiner  Is this a medical examiner's case? Hal Neer BSN RN [place] 02/14/2022, 3:21 AM

## 2022-02-01 NOTE — Progress Notes (Signed)
    OVERNIGHT PROGRESS REPORT  Notified by RN that patient has expired at 0300 hrs.  Patient was DNR with considering of Comfort Care.   2 RN verified.  Family was not immediately available to RN... RN Contacting family in progress at the time of this note.     Gershon Cull MSNA ACNPC-AG Acute Care Nurse Practitioner [place]

## 2022-02-01 NOTE — Death Summary Note (Signed)
DEATH SUMMARY   Patient Details  Name: Chad [name] MRN: [id number] DOB: 1931-11-26 KXF:[id number], Ivin Booty, MD Admission/Discharge Information   Admit Date:  02/05/22  Date of Death: Date of Death: 02-20-22  Time of Death: Time of Death: 39  Length of Stay: 15   Principle Cause of death: Aspiration pneumonia with acute hypoxic respiratory failure in the setting of acute CVA.  Hospital Diagnoses: Principal Problem:   Acute respiratory failure with hypoxia ([place]) Active Problems:   Septic shock (HCC)   Aspiration pneumonia ([place])   Internal carotid artery stenosis, right   Acute CVA (cerebrovascular accident) ([place])   Melena   Acute renal failure superimposed on stage 3a chronic kidney disease (HCC)   Hyperkalemia   Hypernatremia   Bladder outlet obstruction   ATN (acute tubular necrosis) (HCC)   Protein-calorie malnutrition, moderate (HCC)   Chronic heart failure with preserved ejection fraction (HFpEF)    Essential hypertension   Malnutrition of moderate degree   Hyperlipidemia   Comfort measures only status   Palliative care by specialist  Hospital Course: 88 year old male with past medical history of HTN, CVA, with left-sided hemiplegia, carotid stenosis, dysphagia on PEG tube presents from CIR due to respiratory distress. Initially was admitted to the hospitalist service on BiPAP but required transfer to the ICU.  February 06, 2023 Admit from CIR, intubated upon arrival to ICU, concern for GI bleed, shock 6/15 convulsive movements of R arm, started LTM EEG, head CT findings worrisome for new stroke 6/16 EGD -gastric polyps noted, erythema of gastric antrum, no ulcer, food in esophagus and stomach 6/17 palliative care and pulmonary spoke to patient's family>  6/18 MRI Brain -acute/subacute non-hemorrhagic infarct involving the right frontal operculum, right middle frontal gyrus, left frontal lobe and right occipital lobe, remote lacunar infarcts of the basal ganglia & thalami  bilaterally & in cerebellum, atrophy and white matter disease moderately advanced for age 91/18 MRA Brain >> signal loss at the site of the previous right superior division M2 occlusion, flow is distal to signal loss.  This likely represents a residual high grade stenosis, otherwise normal MRA circle of Willis Self extubated 6/19.  GOC discussed, no plans for reintubation, DNR, comfort care if decompensates 6/21: Na 156>>FW increased. Resumed atenolol for rate control. Tx orders placed. 6/23: Worsening mental status/worsening respiratory status. Concern for continued aspiration. 6/24: Leukocytosis, worsening oxygen requirement. Chest x-ray concerning for aspiration. Zosyn IV started. Continued goals of care discussions for transition to comfort measures 6/25: Full comfort measures  Assessment and Plan: Acute respiratory failure with hypoxia Likely secondary to aspiration/pneumonia. Patient required intubation on 06-Feb-2023 and self-extubated on 6/19. Weaned to room air. Resolved.   Aspiration pneumonia Patient completed a 5 day course of Unasyn. New course of Zosyn IV started on 6/24. Patient transitioned to comfort measures. Zosyn discontinued.   AKI on CKD stage IIIa Baseline creatinine of about 1.3 - 1.4. Creatinine of 3.84 on admission with peak of 3.86. was Trending down.   Hypernatremia Secondary to decreased free water. Peak sodium of 150.  Improved with free water through tube feeds   Anasarca Noted. Secondary to hypoalbuminemia.   Malnutrition Patient required Cortrak for tube feeds. Now transitioned to comfort measures. Cortrak tube removed on 6/25.   Bladder outlet obstruction Foley catheter in place.   Melena Patient with multiple melanotic stool. Complicated by Brilinta and aspirin. Hemoglobin has remained stable. GI consulted and performed EGD on 6/16 without etiology for bleeding identified. Patient with epistaxis which may be contributing  to melanotic stool. Hemoglobin  stable.   Acute CVA Patient with prior recent stroke. On current admission, patient with right MCA infarct that extended from recent infarct in setting of hypotension, complicated by known right ICA stenosis s/p angioplasty. MRI brain (6/18) confirms right frontal operculum with acute/subacute infarct extending into the perirolandic cortex in addition to punctate left frontal and right occipital infarct. MRA heard (6/18) significant for signal loss in right superior division with right M2 occlusion. Carotid ultrasound (6/18) significant for right stenosis of 1-39%; left carotid unable to evaluate secondary to patient movement. LDL of 90 from, hemoglobin A1C of 6.1% and transthoracic echocardiogram significant for normal LVEF without atrial level shunt from prior admission. Neurology recommendations for aspirin 81 mg daily and Brilinta 90 mg BID. PT/OT recommendations for SNF. Poor prognosis secondary to dysphagia/aspiration issues.  After conversation with palliative care patient was transitioned to comfort care.   Carotid stenosis Right ICA stenosis s/p angioplasty.   Convulsions Episodes in the ICU. LTM EEG ordered and was negative for active seizure activity.   Chronic diastolic heart failure Stable.   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 Currently on SSI for tube feeds.  Procedures: EEG  Consultations: PCCM, neurology, palliative care, GI  The results of significant diagnostics from this hospitalization (including imaging, microbiology, ancillary and laboratory) are listed below for reference.   Significant Diagnostic Studies: DG CHEST PORT 1 VIEW  Result Date: 01/24/2022 CLINICAL DATA:  Tachypnea EXAM: PORTABLE CHEST 1 VIEW COMPARISON:  Previous studies including the examination of 01/22/2022 FINDINGS: Transverse diameter of heart is increased. There is increased density with air bronchogram  in the left lower lung fields. Rest of the lung fields are clear. There are no signs of alveolar pulmonary edema. Enteric tube is noted traversing the esophagus with its tip in the antrum of the stomach. IMPRESSION: Cardiomegaly. Increased density in the left lower lung fields suggests atelectasis/pneumonia in the left lower lobe. Electronically Signed   By: Elmer Picker M.D.   On: 01/24/2022 13:28   DG CHEST PORT 1 VIEW  Result Date: 01/22/2022 CLINICAL DATA:  Shortness of breath. EXAM: PORTABLE CHEST 1 VIEW COMPARISON:  January 15, 2022. FINDINGS: Feeding tube courses through in off the field of the radiograph tip in the upper abdomen. EKG leads project over the patient's chest.  Post extubation. Cardiomediastinal contours and hilar structures are stable. No signs of lobar consolidation. Improved appearance of airspace component of interstitial and airspace opacity seen on the prior study. No pneumothorax. On limited assessment no acute skeletal finding. IMPRESSION: Post extubation. Improved appearance of interstitial and airspace opacities since the prior study. Still with areas of patchy opacity in the RIGHT mid and LEFT mid chest in particular. No signs of lobar consolidation or gross effusion. Electronically Signed   By: Zetta Bills M.D.   On: 01/22/2022 07:24   VAS US CAROTID  Result Date: 01/20/2022 Carotid Arterial Duplex Study Patient Name:  Chad [name]  Date of Exam:   01/19/2022 Medical Rec #: [id number]          Accession #:    [id number] Date of Birth: 03-Aug-1932           Patient Gender: M Patient Age:   88 years Exam Location:  [facility] Procedure:      VAS US CAROTID Referring Phys: Cornelius Moras XU --------------------------------------------------------------------------------  Indications:       CVA. Risk Factors:  Hypertension, hyperlipidemia. Limitations        Today's exam was limited due to the patient's inability or                    unwillingness to cooperate, the  patient's respiratory                    variation, patient on a ventilator and patient movement,                    patient positioning. Comparison Study:  No prior studies. Performing Technologist: Oliver Hum RVT  Examination Guidelines: A complete evaluation includes B-mode imaging, spectral Doppler, color Doppler, and power Doppler as needed of all accessible portions of each vessel. Bilateral testing is considered an integral part of a complete examination. Limited examinations for reoccurring indications may be performed as noted.  Right Carotid Findings: +----------+--------+--------+--------+--------------------------+--------+           PSV cm/sEDV cm/sStenosisPlaque Description        Comments +----------+--------+--------+--------+--------------------------+--------+ CCA Prox  53      9               irregular and heterogenous         +----------+--------+--------+--------+--------------------------+--------+ CCA Distal61      8               irregular and heterogenous         +----------+--------+--------+--------+--------------------------+--------+ ICA Prox  77      9               calcific and heterogenous          +----------+--------+--------+--------+--------------------------+--------+ ICA Distal57      12                                        tortuous +----------+--------+--------+--------+--------------------------+--------+ ECA       83      7                                                  +----------+--------+--------+--------+--------------------------+--------+ +----------+--------+-------+--------+-------------------+           PSV cm/sEDV cmsDescribeArm Pressure (mmHG) +----------+--------+-------+--------+-------------------+ [id number]                                         +----------+--------+-------+--------+-------------------+ +---------+--------+--+--------+-+----------------------------+ VertebralPSV cm/s67EDV  cm/s5Antegrade and High resistant +---------+--------+--+--------+-+----------------------------+  Left Carotid Findings: Unable to obtain diagnostic images of the left carotid system due to patient movement, and poor positioning.  Summary: Right Carotid: Velocities in the right ICA are consistent with a 1-39% stenosis. Vertebrals: Right vertebral artery demonstrates high resistant flow. *See table(s) above for measurements and observations.  Electronically signed by Jamelle Haring on 01/20/2022 at 11:43:21 AM.    Final    MR ANGIO HEAD WO CONTRAST  Result Date: 01/19/2022 CLINICAL DATA:  Stroke, follow-up. Right MCA infarct. Additional punctate infarcts involving the right occipital lobe and left frontal lobe. EXAM: MRA HEAD WITHOUT CONTRAST TECHNIQUE: Angiographic images of the Circle of Willis were acquired using MRA technique without intravenous contrast. COMPARISON:  MRA head 12/30/2021.  CTA head 12/30/2021 FINDINGS: Anterior circulation: Focal signal  loss is present at the site of the previous right superior division M2 occlusion. Flow signal is now present distal to this region. The inferior M2 segment is within normal limits. Left MCA bifurcations are normal. Mild irregularity is present the cavernous internal carotid artery without a significant ICA stenosis. A1 and M1 segments are normal. The MCA bifurcations are intact. ACA branch vessels are normal. Posterior circulation: Left vertebral artery is the dominant vessel. The right vertebral artery is hypoplastic common bifurcating at the PICA. The vertebrobasilar junction and basilar artery are normal. Both posterior cerebral arteries originate from basilar tip. Left posterior communicating artery is patent. Anatomic variants: None Other: None. IMPRESSION: 1. Signal loss at the site of the previous right superior division M2 occlusion. Flow is present distal to signal loss. This likely represents a residual high-grade stenosis. 2. Otherwise normal MRA  circle-of-Willis. Electronically Signed   By: San Morelle M.D.   On: 01/19/2022 18:55   MR BRAIN WO CONTRAST  Result Date: 01/19/2022 CLINICAL DATA:  Mental status change.  Stroke, follow-up. EXAM: MRI HEAD WITHOUT CONTRAST TECHNIQUE: Multiplanar, multiecho pulse sequences of the brain and surrounding structures were obtained without intravenous contrast. COMPARISON:  CT head without contrast 01/16/2022. MR head without contrast 12/30/2021. FINDINGS: Brain: Restricted diffusion is present in the right frontal operculum consistent with acute/subacute infarct. This extends superiorly the perirolandic cortex on the right. Involvement of the right middle frontal gyrus also noted. A punctate nonhemorrhagic infarct is present in the left frontal lobe punctate cortical infarct is present in the right occipital lobe. The infarct involves the superior aspect of the right insula. T2 and FLAIR hyperintensities are associated with the areas of acute infarction. Confluent periventricular and subcortical T2 hyperintensities are present otherwise. Remote lacunar infarcts are present the basal ganglia bilaterally. Remote lacunar infarcts are present in the thalami. White matter changes extend into the brainstem. Remote lacunar infarcts of the cerebellum are stable. The ventricles are proportionate to the degree of atrophy. No significant extraaxial fluid collection is present. The internal auditory canals are within normal limits. Vascular: Flow is present in the major intracranial arteries. Skull and upper cervical spine: Degenerative changes are present in the upper cervical spine with narrowing at C3-4. The craniocervical junction is normal. Marrow signal is normal. Sinuses/Orbits: Bilateral mastoid effusions are present. Fluid is present the nasopharynx. This is sec scratched at this is likely secondary to intubation. The paranasal sinuses and mastoid air cells are otherwise clear. Bilateral lens replacements are  noted. Globes and orbits are otherwise unremarkable. IMPRESSION: 1. Acute/subacute nonhemorrhagic infarct involving the right frontal operculum, right middle frontal gyrus, left frontal lobe and right occipital lobe. 2. Remote lacunar infarcts of the basal ganglia and thalami bilaterally. 3. Remote lacunar infarcts in the cerebellum. 4. Atrophy and white matter disease is moderately advanced for age. This likely reflects the sequela of chronic microvascular ischemia. Electronically Signed   By: San Morelle M.D.   On: 01/19/2022 18:49   DG Abd Portable 1V  Result Date: 01/19/2022 CLINICAL DATA:  Nausea and vomiting. EXAM: PORTABLE ABDOMEN - 1 VIEW COMPARISON:  None Available. FINDINGS: Small bore feeding tube terminates in the distal stomach. Bowel gas pattern is unremarkable. IMPRESSION: Small bore feeding tube terminates in the distal stomach. Electronically Signed   By: San Morelle M.D.   On: 01/19/2022 14:53   DG Abd Portable 1V  Result Date: 01/23/2022 CLINICAL DATA:  Feeding tube EXAM: PORTABLE ABDOMEN - 1 VIEW COMPARISON:  None Available. FINDINGS: Enteric  tube is present with tip overlying the distal stomach in the pyloric region. There is some contrast within the fundus of the stomach and colon. IMPRESSION: Enteric tube tip overlies the distal stomach. Electronically Signed   By: Macy Mis M.D.   On: 01/16/2022 10:51   Overnight EEG with video  Result Date: 01/19/2022 Lora Havens, MD     01/18/2022  1:32 PM Patient Name: Chad [name] MRN: [id number] Epilepsy Attending: Lora Havens Referring Physician/Provider: Gwinda Maine, MD Duration: 01/16/2022 1240 to 01/09/2022  1240  Patient history: 88 year old male with history of stroke had an episode of posturing right arm and left gaze.  EEG to evaluate for seizure.  Level of alertness:  lethargic  AEDs during EEG study: None  Technical aspects: This EEG study was done with scalp electrodes positioned according  to the 10-20 International system of electrode placement. Electrical activity was acquired at a sampling rate of 500Hz  and reviewed with a high frequency filter of 70Hz  and a low frequency filter of 1Hz . EEG data were recorded continuously and digitally stored.  Description: EEG showed continuous generalized and and lateralized right hemisphere 3- 6 Hz theta-delta slowing. Hyperventilation and photic stimulation were not performed.    ABNORMALITY - Continuous slow, generalized and lateralized right hemisphere  IMPRESSION: This study is suggestive of cortical dysfunction arising from right hemisphere which is likely secondary to underlying strokes. Additionally there is moderate to severe diffuse encephalopathy, nonspecific etiology. No seizures or epileptiform discharges were seen throughout the recording.  Lora Havens    EEG adult  Result Date: 01/16/2022 Lora Havens, MD     01/16/2022 12:45 PM Patient Name: Chad [name] MRN: [id number] Epilepsy Attending: Lora Havens Referring Physician/Provider: Corey Harold, NP Date: 01/16/2022 Duration: 21.43 mins Patient history: 88 year old male with history of stroke had an episode of posturing right arm and left gaze.  EEG to evaluate for seizure. Level of alertness:  lethargic AEDs during EEG study: Ativan Technical aspects: This EEG study was done with scalp electrodes positioned according to the 10-20 International system of electrode placement. Electrical activity was acquired at a sampling rate of 500Hz  and reviewed with a high frequency filter of 70Hz  and a low frequency filter of 1Hz . EEG data were recorded continuously and digitally stored. Description: EEG showed continuous generalized and maximal left frontal 3- 6 Hz theta-delta slowing which at times appears sharply contoured. Hyperventilation and photic stimulation were not performed.   ABNORMALITY - Continuous slow, generalized IMPRESSION: This study is suggestive of moderate to severe  diffuse encephalopathy, nonspecific etiology. No seizures or epileptiform discharges were seen throughout the recording. Priyanka Barbra Sarks   CT HEAD WO CONTRAST (5MM)  Result Date: 01/16/2022 CLINICAL DATA:  Mental status change, unknown cause. EXAM: CT HEAD WITHOUT CONTRAST TECHNIQUE: Contiguous axial images were obtained from the base of the skull through the vertex without intravenous contrast. RADIATION DOSE REDUCTION: This exam was performed according to the departmental dose-optimization program which includes automated exposure control, adjustment of the mA and/or kV according to patient size and/or use of iterative reconstruction technique. COMPARISON:  CT angiogram head/neck 12/30/2021. Brain MRI 12/30/2021. FINDINGS: Brain: Moderate cerebral atrophy. New from the prior head CT of 12/30/2021, there is a small to moderate-sized acute/subacute cortical and subcortical infarct within the right frontoparietal lobes and right insula (right MCA vascular territory). Additional known small subacute infarcts within the right MCA vascular territory were better appreciated on the prior brain MRI of 12/30/2021 (  acute at that time). Background advanced patchy and ill-defined hypoattenuation within the cerebral white matter, nonspecific but compatible chronic small vessel ischemic disease. Redemonstrated chronic lacunar infarcts within the left basal ganglia and thalamus. There is no acute intracranial hemorrhage. No extra-axial fluid collection. No evidence of an intracranial mass. No midline shift. Vascular: Atherosclerotic calcifications. Redemonstrated focus of calcification along an M2 right middle cerebral artery, which may reflect a calcified embolus. Skull: No fracture or aggressive osseous lesion. Sinuses/Orbits: No mass or acute finding within the imaged orbits. Trace mucosal thickening within the bilateral ethmoid sinuses. IMPRESSION: New from the prior head CT of 12/30/2021, there is a small to  moderate-sized acute/subacute cortical and subcortical infarct within the right frontoparietal lobes and right insula (right MCA vascular territory). Additional known small subacute infarcts within the right MCA vascular territory were better appreciated on the prior brain MRI of 12/30/2021 (acute at that time). Background advanced chronic small vessel ischemic changes within the cerebral white matter. Redemonstrated chronic lacunar infarcts within the left basal ganglia and left thalamus. Moderate generalized cerebral atrophy. Electronically Signed   By: Kellie Simmering D.O.   On: 01/16/2022 11:28   US RENAL  Result Date: 01/03/2022 CLINICAL DATA:  Renal dysfunction EXAM: RENAL / URINARY TRACT ULTRASOUND COMPLETE COMPARISON:  None Available. FINDINGS: Right Kidney: Renal measurements: 8.9 x 5 x 5 cm = volume: 115.2 mL. There is no hydronephrosis. There is increased cortical echogenicity. Left Kidney: Renal measurements: 10.3 x 6.2 x 5 cm = volume: 165.9 mL. There is no hydronephrosis. There is increased cortical echogenicity. Bladder: Foley catheter is seen in the bladder. Bladder is not adequately distended for evaluation. Other: 1.1 cm splenule is noted adjacent to the inferior margin of spleen. Left pleural effusion is seen. IMPRESSION: There is no hydronephrosis. Increased cortical echogenicity suggests medical renal disease. Left pleural effusion. Electronically Signed   By: Elmer Picker M.D.   On: 01/20/2022 12:21   Portable Chest x-ray  Result Date: 01/12/2022 CLINICAL DATA:  Intubation EXAM: PORTABLE CHEST 1 VIEW COMPARISON:  01/27/2022, 3:13 a.m. FINDINGS: Interval endotracheal intubation, tip approximately 2 cm above the carina. Enteric feeding tube remains in position, partially imaged. Cardiomegaly. Diffuse bilateral heterogeneous and interstitial airspace opacity, unchanged. IMPRESSION: 1. Interval endotracheal intubation, tip approximately 2 cm above the carina. Enteric feeding tube  remains in position, partially imaged. 2. Cardiomegaly. Diffuse bilateral heterogeneous and interstitial airspace opacity, unchanged. Electronically Signed   By: Delanna Ahmadi M.D.   On: 01/04/2022 10:39   DG Abd 1 View  Result Date: 01/21/2022 CLINICAL DATA:  88 year old male with abdominal pain and vomiting. EXAM: ABDOMEN - 1 VIEW COMPARISON:  01/10/2022. FINDINGS: Portable AP supine views at 0704 hours. Patient is mildly rotated to the left but the enteric tube tip is now in the gastric body. Extensive Calcified aortic atherosclerosis. Pelvic vascular calcifications. Progressive opacification of the visible left lung base, left hemidiaphragm now largely obscured. Decreased large bowel gas and borderline to mildly dilated gas-filled mid abdominal small bowel loops now. Spinal scoliosis and degeneration. No acute osseous abnormality identified. IMPRESSION: 1. Enteric tube tip now in the gastric body. 2. Decreased large bowel gas and borderline to mildly dilated mid abdominal small bowel loops. This could reflect ileus or developing small bowel obstruction. Follow-up abdominal radiographs may be valuable. 3. Progressive opacification at the left lung base since 01/10/2022, see portable chest x-ray 0313 hours today. Electronically Signed   By: Genevie Ann M.D.   On: 01/05/2022 07:37   DG  Chest Port 1 View  Result Date: 01/16/2022 CLINICAL DATA:  Acute respiratory distress EXAM: PORTABLE CHEST 1 VIEW COMPARISON:  03/23/2022 FINDINGS: Cardiomegaly. Vascular congestion. Aortic atherosclerosis. Perihilar airspace opacities bilaterally. No effusions. No acute bony abnormality. IMPRESSION: Perihilar airspace opacities, likely edema although pneumonia not excluded. Electronically Signed   By: Rolm Baptise M.D.   On: 01/04/2022 03:29   DG Swallowing Func-Speech Pathology  Result Date: 01/10/2022 Table formatting from the original result was not included. Objective Swallowing Evaluation: Type of Study: MBS-Modified  Barium Swallow Study  Patient Details Name: Chad [name] MRN: [id number] Date of Birth: 08-23-1931 Today's Date: 01/10/2022 Time: SLP Start Time (ACUTE ONLY): 1100 -SLP Stop Time (ACUTE ONLY): 1123 SLP Time Calculation (min) (ACUTE ONLY): 23 min Past Medical History: Past Medical History: Diagnosis Date  Arthritis   Heart murmur   asa child   Hypertension   Stroke [facility])  Past Surgical History: Past Surgical History: Procedure Laterality Date  EYE SURGERY Bilateral 2022  IR ANGIO INTRA EXTRACRAN SEL COM CAROTID INNOMINATE UNI R MOD SED  01/01/2022  IR CT HEAD LTD  01/01/2022  IR PTA INTRACRANIAL  01/01/2022  JOINT REPLACEMENT    RADIOLOGY WITH ANESTHESIA N/A 01/01/2022  Procedure: Angiogram;  Surgeon: Luanne Bras, MD;  Location: [place];  Service: Radiology;  Laterality: N/A;  TONSILLECTOMY    TOTAL KNEE ARTHROPLASTY Right 03/30/2013  Procedure: RIGHT TOTAL KNEE ARTHROPLASTY;  Surgeon: Tobi Bastos, MD;  Location: [facility];  Service: Orthopedics;  Laterality: Right;  TOTAL KNEE ARTHROPLASTY Left 10/09/2020  Procedure: TOTAL KNEE ARTHROPLASTY;  Surgeon: Paralee Cancel, MD;  Location: [facility];  Service: Orthopedics;  Laterality: Left;  70 mins HPI: 88 yo male presenting to the ED on 5/28 with L sided weakness, facial droop, slurred speech, and fall. 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 PMH including hypothyroidism chronic pain syndromes, CKD, HTN, arthritis, bil TKA.  Subjective: alert, friendly  Recommendations for follow up therapy are one component of a multi-disciplinary discharge planning process, led by the attending physician.  Recommendations may be updated based on patient status, additional functional criteria and insurance authorization. Assessment / Plan / Recommendation   01/10/2022   8:50 PM Clinical Impressions Clinical Impressions SLP Visit Diagnosis MBSS completed secondary to clinical concerns for aspiration of current diet textures (puree  and nectar-thick liquids). At this time, pt presents with severe oropharyngeal dysphagia, with significant UES dysfunction, which resulted in significant posterior tracheal sensed aspiration (PAS 7) of pureed texture via interarytenoid space due to overflow of pyriform sinus residuals into laryngeal vestibule. Unfortunately, pharyngeal stasis and pyriform sinus residuals could not clear due to poor UES relaxation with mild esophageal backflow to pyriform sinuses. Additionally, delayed sensation of gross aspiration of nectar-thick liquid via tsp was appreciated during the swallow. [name] cough response was not effective in pulmonary clearance. Pt required tactile, hyoglossal assistance and verbal cues, from SLP, to initiate swallow response during puree trials. It should be noted that this is a significant change from pt's MBSS results documented on 01/02/2022.   Given pt's risk severity for development of aspiration PNA (Total A for oral care, decreased ambulation, deconditioning) and change in swallow function, recommend initiation of NPO with temporary alternate means of nutrition, hydration, and medications at this time. May consider GI referral to assess UES function if improvement is not noted with therapy alone. [name] daughter present and results + recommendations were reviewed with pt an daughter who verbalized agreement  with recommendations following education. MD, LPN, and PA notified of recommendations. Please see imaging report for full details. Dysphagia, oropharyngeal phase (R13.12);Cognitive communication deficit (R41.841) Impact on safety and function Severe aspiration risk;Risk for inadequate nutrition/hydration     01/02/2022  11:00 AM Treatment Recommendations Treatment Recommendations Therapy as outlined in treatment plan below     01/10/2022   1:59 PM Prognosis Prognosis for Safe Diet Advancement Good Barriers to Reach Goals Cognitive deficits;Severity of deficits   01/10/2022   8:50 PM Diet Recommendations  SLP Diet Recommendations NPO;Alternative means - temporary Medication Administration Via alternative means Postural Changes Other (Comment)     01/10/2022   8:50 PM Other Recommendations Recommended Consults Consider GI evaluation;Consider esophageal assessment Oral Care Recommendations Oral care QID Other Recommendations Have oral suction available   01/10/2022   8:50 PM Frequency and Duration  Treatment Duration 2 weeks     01/10/2022   1:53 PM Oral Phase Oral Phase Impaired Oral - Nectar Teaspoon Weak lingual manipulation;Lingual pumping;Delayed oral transit;Decreased bolus cohesion;Premature spillage;Lingual/palatal residue;Piecemeal swallowing;Reduced posterior propulsion Oral - Nectar Cup NT Oral - Nectar Straw NT Oral - Thin Cup NT Oral - Thin Straw NT Oral - Puree Weak lingual manipulation;Lingual pumping;Incomplete tongue to palate contact;Lingual/palatal residue;Delayed oral transit;Decreased bolus cohesion;Premature spillage;Holding of bolus;Piecemeal swallowing Oral - Mech Soft NT Oral - Regular NT Oral - Multi-Consistency NT Oral - Pill NT    01/10/2022   1:54 PM Pharyngeal Phase Pharyngeal Phase Impaired Pharyngeal- Nectar Teaspoon Delayed swallow initiation-pyriform sinuses;Reduced pharyngeal peristalsis;Reduced airway/laryngeal closure;Moderate aspiration;Inter-arytenoid space residue;Pharyngeal residue - cp segment;Pharyngeal residue - posterior pharnyx;Pharyngeal residue - pyriform;Pharyngeal residue - valleculae;Lateral channel residue;Penetration/Aspiration during swallow;Reduced laryngeal elevation Pharyngeal Material enters airway, passes BELOW cords and not ejected out despite cough attempt by patient Pharyngeal- Nectar Cup NT Pharyngeal- Nectar Straw NT Pharyngeal- Thin Cup NT Pharyngeal- Thin Straw NT Pharyngeal- Puree Delayed swallow initiation-pyriform sinuses;Reduced pharyngeal peristalsis;Reduced anterior laryngeal mobility;Reduced tongue base retraction;Penetration/Apiration after  swallow;Moderate aspiration;Pharyngeal residue - pyriform;Pharyngeal residue - posterior pharnyx;Pharyngeal residue - cp segment;Inter-arytenoid space residue;Lateral channel residue;Compensatory strategies attempted (with notebox) Pharyngeal Material enters airway, passes BELOW cords and not ejected out despite cough attempt by patient Pharyngeal- Mechanical Soft NT Pharyngeal- Regular NT Pharyngeal- Multi-consistency NT Pharyngeal- Pill NT    01/10/2022   1:58 PM Cervical Esophageal Phase  Cervical Esophageal Phase Impaired Nectar Teaspoon Reduced cricopharyngeal relaxation Nectar Cup NT Nectar Straw NT Puree Esophageal backflow into the pharynx;Reduced cricopharyngeal relaxation Mechanical Soft NT Regular NT Multi-consistency NT Pill NT Cervical Esophageal Comment Decreased relaxation of the UES observed with significant pyriform sinus residuals Bethany A Lutes 01/10/2022, 9:06 PM                     DG Abd Portable 1V  Result Date: 01/10/2022 CLINICAL DATA:  Feeding tube placement. EXAM: PORTABLE ABDOMEN - 1 VIEW COMPARISON:  None Available. FINDINGS: Feeding tube tip is at the level of the gastric antrum. Small rounded densities are seen in the region of the colon likely related to diverticula. There is a heterogeneous rounded density projecting over the right upper quadrant measuring 3.0 x 3.0 cm. This is indeterminate. Lung bases are clear. There is mild curvature of the lumbar spine with degenerative change. IMPRESSION: 1. Feeding tube tip is at the level of the gastric antrum. 2. Scattered colonic diverticula. 3. 3 cm heterogeneous density in the right upper quadrant, indeterminate. Findings may represent a large gallstone, renal calcification or other calcified mass. Please correlate clinically. This can be further  evaluated with CT. Electronically Signed   By: Ronney Asters M.D.   On: 01/10/2022 15:07   IR ANGIO INTRA EXTRACRAN SEL COM CAROTID INNOMINATE UNI R MOD SED  Result Date: 01/03/2022 CLINICAL  DATA:  Left-sided weakness with dysarthria. Patient with calcified embolus in the inferior division right MCA. Discovered to have high-grade stenosis of the extracranial right ICA proximally EXAM: IR ANGIO INTRA EXTRACRAN SEL COM CAROTID INNOMINATE UNI RIGHT MOD SED COMPARISON:  CT angiogram of the head and neck of Dec 30, 2021. MEDICATIONS: Heparin 3,000 units IV. Ancef 2 g IV antibiotic was administered within 1 hour of the procedure. ANESTHESIA/SEDATION: General anesthesia. CONTRAST:  Omnipaque 300 approximately 120 cc. FLUOROSCOPY TIME:  Fluoroscopy Time: 76 minutes 36) seconds (1257 mGy). COMPLICATIONS: None immediate. TECHNIQUE: Informed written consent was obtained from the patient after a thorough discussion of the procedural risks, benefits and alternatives. All questions were addressed. Maximal Sterile Barrier Technique was utilized including caps, mask, sterile gowns, sterile gloves, sterile drape, hand hygiene and skin antiseptic. A timeout was performed prior to the initiation of the procedure. The left groin was prepped and draped in the usual sterile fashion. Thereafter using modified Seldinger technique, transfemoral access into the left common femoral artery was obtained without difficulty. Over a 0.035 inch guidewire, an 8 French 25 cm Pinnacle sheath was inserted. Through this, and also over 0.035 inch guidewire, a 120 cm 6 French Simmons 2 catheter inside of an 087 95 cm balloon guide catheter combination was advanced to the aortic arch region and selectively positioned in the proximal right common carotid artery and the innominate artery. An arteriogram was then performed centered extra cranially and intracranially. FINDINGS: The innominate arteriogram demonstrates significant tortuosity of the innominate artery in its entirety extending into the right common carotid artery with a 180 degree loop in the proximal right common carotid artery. Subclavian artery demonstrates patency with  opacification of a hypoplastic right vertebral artery extra cranially. The right common carotid arteriogram demonstrates complex atherosclerotic calcified plaque at the right common carotid bifurcation extending into the bulb region of the right internal carotid artery resulting in a high-grade approximately 80-90% stenosis. More distally, the right internal carotid artery opacifies to the cranial skull base. Patency is seen of the petrous, cavernous and supraclinoid right ICA with a prominent right posterior communicating artery. Proximal portions of the right middle cerebral artery and the right anterior cerebral artery demonstrate patency. ENDOVASCULAR REVASCULARIZATION OF HIGH-GRADE STENOSIS OF THE RIGHT INTERNAL CAROTID ARTERY PROXIMALLY WITH BALLOON ANGIOPLASTY The balloon guide catheter was advanced into the proximal right common carotid artery. Through this, and also over a 0.014 inch Transend EX soft tip micro guidewire, an 021 150 cm microcatheter was then advanced with biplane DSA roadmap to the proximal cavernous right ICA followed by the microcatheter. The micro guidewire was removed. Good aspiration obtained from the hub of the microcatheter. A gentle control arteriogram performed through the microcatheter demonstrated safe positioning of the tip of the microcatheter. This in turn was then exchanged out for a 300 cm 014 inch BMW exchange wire with a moderate J configuration. The tip of the micro guidewire was positioned at the petrous cavernous junction. Measurements were then performed of the right internal carotid artery distal to the stenosis, and just proximal to the stenosis of the distal common carotid artery. A 5 mm x 20 mm 014 inch Viatrac balloon guide catheter was then prepped and purged retrogradely with heparinized saline infusion, and retrogradely with 50% contrast and 50%  heparinized saline infusion. Using the rapid exchange technique, the balloon catheter was then advanced without  difficulty and positioned with the distal and proximal markers adequate distant from the site of the severe stenosis in the proximal right internal carotid artery. Slow control inflation was then performed using micro inflation syringe device via micro tubing. Inflations were performed slowly to 9 atmospheres where it was maintained for approximately 60 seconds. Thereafter, the balloon was deflated and retrieved and removed. A control arteriogram performed through the balloon guide catheter in the proximal right common carotid artery demonstrated significantly improved caliber and flow through the angioplastied segment. More distally, free flow was noted into the distal right ICA intracranially. Over the exchange BMW micro guidewire, a 8 mm x 26 mm Wallstent stent delivery system which had been prepped with heparinized saline infusion was then advanced again using the rapid exchange technique to the proximal right common carotid artery. Resistance was encountered to further advancement of the stent delivery apparatus. At this time the balloon guide catheter was advanced to the distal right common carotid artery. Further attempts were made to advance the stent delivery apparatus into the right internal carotid proximally. Advancement could only manage just distal to the angioplastied segment with no significant coverage of the angioplastied segment. Further attempts at advancing were met with resistance secondary to a step-off related to the calcified plaque. After multiple attempts it was decided to stop the procedure. Final control arteriogram performed through the balloon guide catheter in the right common carotid artery continued to demonstrate excellent flow through the right external carotid artery with a mild-to-moderate stenosis at its origin. The right internal carotid artery proximally demonstrated approximately 60% patency which was maintained on subsequent 20 minute arteriogram. Centered intracranially  demonstrated brisk flow into the right anterior cerebral artery and the right middle cerebral distribution. The previously occluded anterior branch of the inferior division proximally due to a calcified plaque remained stable. The delayed arterial phase demonstrated retrograde opacification of the posterior 2/3 of the cortical branches in the sylvian triangle from the branches of the inferior division and the superior division, and also from the pericallosal branches. The balloon guide catheter was removed. The 8 French Pinnacle sheath in the left common femoral artery was removed with manual compression held for approximately 25 minutes with a quick clot to achieved hemostasis at this site. Distal pulses remained palpable in the dorsalis pedis arteries bilaterally unchanged. An immediate CT of the brain demonstrated no evidence of hemorrhagic complications. The patient was then gradually extubated without difficulty. Patient was able to maintain his airway, and oxygen concentrations. He was able to move his right side spontaneously and to command. The patient was able to follow simple commands appropriately. No significant motor movement was evident in the left upper extremity, with only a flicker in the left foot toes. Patient was then transferred to the PACU and then subsequently the neuro ICU for post revascularization care. Later in the afternoon, the patient appeared more awake, alert and appropriately responsive. Had mild intermittent right gaze deviation but was able to track his eyes to the left past the midline. Otherwise, neurologically he remained stable unchanged. 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 PLAN: Follow-up in the clinic 4 to 6 weeks post discharge. Electronically Signed   By: Luanne Bras M.D.   On: 01/03/2022  08:12   IR  CT Head Ltd  Result Date: 01/03/2022 CLINICAL DATA:  Left-sided weakness with dysarthria. Patient with calcified embolus in the inferior division right MCA. Discovered to have high-grade stenosis of the extracranial right ICA proximally EXAM: IR ANGIO INTRA EXTRACRAN SEL COM CAROTID INNOMINATE UNI RIGHT MOD SED COMPARISON:  CT angiogram of the head and neck of Dec 30, 2021. MEDICATIONS: Heparin 3,000 units IV. Ancef 2 g IV antibiotic was administered within 1 hour of the procedure. ANESTHESIA/SEDATION: General anesthesia. CONTRAST:  Omnipaque 300 approximately 120 cc. FLUOROSCOPY TIME:  Fluoroscopy Time: 76 minutes 36) seconds (1257 mGy). COMPLICATIONS: None immediate. TECHNIQUE: Informed written consent was obtained from the patient after a thorough discussion of the procedural risks, benefits and alternatives. All questions were addressed. Maximal Sterile Barrier Technique was utilized including caps, mask, sterile gowns, sterile gloves, sterile drape, hand hygiene and skin antiseptic. A timeout was performed prior to the initiation of the procedure. The left groin was prepped and draped in the usual sterile fashion. Thereafter using modified Seldinger technique, transfemoral access into the left common femoral artery was obtained without difficulty. Over a 0.035 inch guidewire, an 8 French 25 cm Pinnacle sheath was inserted. Through this, and also over 0.035 inch guidewire, a 120 cm 6 French Simmons 2 catheter inside of an 087 95 cm balloon guide catheter combination was advanced to the aortic arch region and selectively positioned in the proximal right common carotid artery and the innominate artery. An arteriogram was then performed centered extra cranially and intracranially. FINDINGS: The innominate arteriogram demonstrates significant tortuosity of the innominate artery in its entirety extending into the right common carotid artery with a 180 degree loop in the proximal right common carotid  artery. Subclavian artery demonstrates patency with opacification of a hypoplastic right vertebral artery extra cranially. The right common carotid arteriogram demonstrates complex atherosclerotic calcified plaque at the right common carotid bifurcation extending into the bulb region of the right internal carotid artery resulting in a high-grade approximately 80-90% stenosis. More distally, the right internal carotid artery opacifies to the cranial skull base. Patency is seen of the petrous, cavernous and supraclinoid right ICA with a prominent right posterior communicating artery. Proximal portions of the right middle cerebral artery and the right anterior cerebral artery demonstrate patency. ENDOVASCULAR REVASCULARIZATION OF HIGH-GRADE STENOSIS OF THE RIGHT INTERNAL CAROTID ARTERY PROXIMALLY WITH BALLOON ANGIOPLASTY The balloon guide catheter was advanced into the proximal right common carotid artery. Through this, and also over a 0.014 inch Transend EX soft tip micro guidewire, an 021 150 cm microcatheter was then advanced with biplane DSA roadmap to the proximal cavernous right ICA followed by the microcatheter. The micro guidewire was removed. Good aspiration obtained from the hub of the microcatheter. A gentle control arteriogram performed through the microcatheter demonstrated safe positioning of the tip of the microcatheter. This in turn was then exchanged out for a 300 cm 014 inch BMW exchange wire with a moderate J configuration. The tip of the micro guidewire was positioned at the petrous cavernous junction. Measurements were then performed of the right internal carotid artery distal to the stenosis, and just proximal to the stenosis of the distal common carotid artery. A 5 mm x 20 mm 014 inch Viatrac balloon guide catheter was then prepped and purged retrogradely with heparinized saline infusion, and retrogradely with 50% contrast and 50% heparinized saline infusion. Using the rapid exchange technique,  the balloon catheter was then advanced without difficulty and positioned with the distal and proximal markers adequate distant from the  site of the severe stenosis in the proximal right internal carotid artery. Slow control inflation was then performed using micro inflation syringe device via micro tubing. Inflations were performed slowly to 9 atmospheres where it was maintained for approximately 60 seconds. Thereafter, the balloon was deflated and retrieved and removed. A control arteriogram performed through the balloon guide catheter in the proximal right common carotid artery demonstrated significantly improved caliber and flow through the angioplastied segment. More distally, free flow was noted into the distal right ICA intracranially. Over the exchange BMW micro guidewire, a 8 mm x 26 mm Wallstent stent delivery system which had been prepped with heparinized saline infusion was then advanced again using the rapid exchange technique to the proximal right common carotid artery. Resistance was encountered to further advancement of the stent delivery apparatus. At this time the balloon guide catheter was advanced to the distal right common carotid artery. Further attempts were made to advance the stent delivery apparatus into the right internal carotid proximally. Advancement could only manage just distal to the angioplastied segment with no significant coverage of the angioplastied segment. Further attempts at advancing were met with resistance secondary to a step-off related to the calcified plaque. After multiple attempts it was decided to stop the procedure. Final control arteriogram performed through the balloon guide catheter in the right common carotid artery continued to demonstrate excellent flow through the right external carotid artery with a mild-to-moderate stenosis at its origin. The right internal carotid artery proximally demonstrated approximately 60% patency which was maintained on subsequent 20  minute arteriogram. Centered intracranially demonstrated brisk flow into the right anterior cerebral artery and the right middle cerebral distribution. The previously occluded anterior branch of the inferior division proximally due to a calcified plaque remained stable. The delayed arterial phase demonstrated retrograde opacification of the posterior 2/3 of the cortical branches in the sylvian triangle from the branches of the inferior division and the superior division, and also from the pericallosal branches. The balloon guide catheter was removed. The 8 French Pinnacle sheath in the left common femoral artery was removed with manual compression held for approximately 25 minutes with a quick clot to achieved hemostasis at this site. Distal pulses remained palpable in the dorsalis pedis arteries bilaterally unchanged. An immediate CT of the brain demonstrated no evidence of hemorrhagic complications. The patient was then gradually extubated without difficulty. Patient was able to maintain his airway, and oxygen concentrations. He was able to move his right side spontaneously and to command. The patient was able to follow simple commands appropriately. No significant motor movement was evident in the left upper extremity, with only a flicker in the left foot toes. Patient was then transferred to the PACU and then subsequently the neuro ICU for post revascularization care. Later in the afternoon, the patient appeared more awake, alert and appropriately responsive. Had mild intermittent right gaze deviation but was able to track his eyes to the left past the midline. Otherwise, neurologically he remained stable unchanged. 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 PLAN: Follow-up in the clinic 4 to 6 weeks post discharge. Electronically Signed   By:  Luanne Bras M.D.   On: 01/03/2022 08:12   IR PTA Intracranial  Result Date: 01/03/2022 CLINICAL DATA:  Left-sided weakness with dysarthria. Patient with calcified embolus in the inferior division right MCA. Discovered to have high-grade stenosis of  the extracranial right ICA proximally EXAM: IR ANGIO INTRA EXTRACRAN SEL COM CAROTID INNOMINATE UNI RIGHT MOD SED COMPARISON:  CT angiogram of the head and neck of Dec 30, 2021. MEDICATIONS: Heparin 3,000 units IV. Ancef 2 g IV antibiotic was administered within 1 hour of the procedure. ANESTHESIA/SEDATION: General anesthesia. CONTRAST:  Omnipaque 300 approximately 120 cc. FLUOROSCOPY TIME:  Fluoroscopy Time: 76 minutes 36) seconds (1257 mGy). COMPLICATIONS: None immediate. TECHNIQUE: Informed written consent was obtained from the patient after a thorough discussion of the procedural risks, benefits and alternatives. All questions were addressed. Maximal Sterile Barrier Technique was utilized including caps, mask, sterile gowns, sterile gloves, sterile drape, hand hygiene and skin antiseptic. A timeout was performed prior to the initiation of the procedure. The left groin was prepped and draped in the usual sterile fashion. Thereafter using modified Seldinger technique, transfemoral access into the left common femoral artery was obtained without difficulty. Over a 0.035 inch guidewire, an 8 French 25 cm Pinnacle sheath was inserted. Through this, and also over 0.035 inch guidewire, a 120 cm 6 French Simmons 2 catheter inside of an 087 95 cm balloon guide catheter combination was advanced to the aortic arch region and selectively positioned in the proximal right common carotid artery and the innominate artery. An arteriogram was then performed centered extra cranially and intracranially. FINDINGS: The innominate arteriogram demonstrates significant tortuosity of the innominate artery in its entirety extending into the right common carotid artery with a 180 degree  loop in the proximal right common carotid artery. Subclavian artery demonstrates patency with opacification of a hypoplastic right vertebral artery extra cranially. The right common carotid arteriogram demonstrates complex atherosclerotic calcified plaque at the right common carotid bifurcation extending into the bulb region of the right internal carotid artery resulting in a high-grade approximately 80-90% stenosis. More distally, the right internal carotid artery opacifies to the cranial skull base. Patency is seen of the petrous, cavernous and supraclinoid right ICA with a prominent right posterior communicating artery. Proximal portions of the right middle cerebral artery and the right anterior cerebral artery demonstrate patency. ENDOVASCULAR REVASCULARIZATION OF HIGH-GRADE STENOSIS OF THE RIGHT INTERNAL CAROTID ARTERY PROXIMALLY WITH BALLOON ANGIOPLASTY The balloon guide catheter was advanced into the proximal right common carotid artery. Through this, and also over a 0.014 inch Transend EX soft tip micro guidewire, an 021 150 cm microcatheter was then advanced with biplane DSA roadmap to the proximal cavernous right ICA followed by the microcatheter. The micro guidewire was removed. Good aspiration obtained from the hub of the microcatheter. A gentle control arteriogram performed through the microcatheter demonstrated safe positioning of the tip of the microcatheter. This in turn was then exchanged out for a 300 cm 014 inch BMW exchange wire with a moderate J configuration. The tip of the micro guidewire was positioned at the petrous cavernous junction. Measurements were then performed of the right internal carotid artery distal to the stenosis, and just proximal to the stenosis of the distal common carotid artery. A 5 mm x 20 mm 014 inch Viatrac balloon guide catheter was then prepped and purged retrogradely with heparinized saline infusion, and retrogradely with 50% contrast and 50% heparinized saline  infusion. Using the rapid exchange technique, the balloon catheter was then advanced without difficulty and positioned with the distal and proximal markers adequate distant from the site of the severe stenosis in the proximal right internal carotid artery. Slow control inflation was then performed using micro inflation syringe device via micro tubing. Inflations were performed slowly  to 9 atmospheres where it was maintained for approximately 60 seconds. Thereafter, the balloon was deflated and retrieved and removed. A control arteriogram performed through the balloon guide catheter in the proximal right common carotid artery demonstrated significantly improved caliber and flow through the angioplastied segment. More distally, free flow was noted into the distal right ICA intracranially. Over the exchange BMW micro guidewire, a 8 mm x 26 mm Wallstent stent delivery system which had been prepped with heparinized saline infusion was then advanced again using the rapid exchange technique to the proximal right common carotid artery. Resistance was encountered to further advancement of the stent delivery apparatus. At this time the balloon guide catheter was advanced to the distal right common carotid artery. Further attempts were made to advance the stent delivery apparatus into the right internal carotid proximally. Advancement could only manage just distal to the angioplastied segment with no significant coverage of the angioplastied segment. Further attempts at advancing were met with resistance secondary to a step-off related to the calcified plaque. After multiple attempts it was decided to stop the procedure. Final control arteriogram performed through the balloon guide catheter in the right common carotid artery continued to demonstrate excellent flow through the right external carotid artery with a mild-to-moderate stenosis at its origin. The right internal carotid artery proximally demonstrated approximately 60%  patency which was maintained on subsequent 20 minute arteriogram. Centered intracranially demonstrated brisk flow into the right anterior cerebral artery and the right middle cerebral distribution. The previously occluded anterior branch of the inferior division proximally due to a calcified plaque remained stable. The delayed arterial phase demonstrated retrograde opacification of the posterior 2/3 of the cortical branches in the sylvian triangle from the branches of the inferior division and the superior division, and also from the pericallosal branches. The balloon guide catheter was removed. The 8 French Pinnacle sheath in the left common femoral artery was removed with manual compression held for approximately 25 minutes with a quick clot to achieved hemostasis at this site. Distal pulses remained palpable in the dorsalis pedis arteries bilaterally unchanged. An immediate CT of the brain demonstrated no evidence of hemorrhagic complications. The patient was then gradually extubated without difficulty. Patient was able to maintain his airway, and oxygen concentrations. He was able to move his right side spontaneously and to command. The patient was able to follow simple commands appropriately. No significant motor movement was evident in the left upper extremity, with only a flicker in the left foot toes. Patient was then transferred to the PACU and then subsequently the neuro ICU for post revascularization care. Later in the afternoon, the patient appeared more awake, alert and appropriately responsive. Had mild intermittent right gaze deviation but was able to track his eyes to the left past the midline. Otherwise, neurologically he remained stable unchanged. 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 PLAN: Follow-up in the clinic 4 to 6 weeks  post discharge. Electronically Signed   By: Luanne Bras M.D.   On: 01/03/2022 08:12   DG Swallowing Func-Speech Pathology  Result Date: 01/02/2022 Table formatting from the original result was not included. Objective Swallowing Evaluation: Type of Study: MBS-Modified Barium Swallow Study  Patient Details Name: Chad [name] MRN: [id number] Date of Birth: 1931-10-25 Today's Date: 01/02/2022 Time: SLP Start Time (ACUTE ONLY): 1100 -SLP Stop Time (ACUTE ONLY): 1123 SLP Time Calculation (min) (  ACUTE ONLY): 23 min Past Medical History: Past Medical History: Diagnosis Date  Arthritis   Heart murmur   asa child   Hypertension   Stroke [facility])  Past Surgical History: Past Surgical History: Procedure Laterality Date  EYE SURGERY Bilateral 2022  JOINT REPLACEMENT    RADIOLOGY WITH ANESTHESIA N/A 01/01/2022  Procedure: Angiogram;  Surgeon: Luanne Bras, MD;  Location: [facility];  Service: Radiology;  Laterality: N/A;  TONSILLECTOMY    TOTAL KNEE ARTHROPLASTY Right 03/30/2013  Procedure: RIGHT TOTAL KNEE ARTHROPLASTY;  Surgeon: Tobi Bastos, MD;  Location: [facility];  Service: Orthopedics;  Laterality: Right;  TOTAL KNEE ARTHROPLASTY Left 10/09/2020  Procedure: TOTAL KNEE ARTHROPLASTY;  Surgeon: Paralee Cancel, MD;  Location: [facility];  Service: Orthopedics;  Laterality: Left;  70 mins HPI: 88 yo male presenting to the ED on 5/28 with L sided weakness, facial droop, slurred speech, and fall. 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 PMH including hypothyroidism chronic pain syndromes, CKD, HTN, arthritis, bil TKA.  Subjective: alert, friendly  Recommendations for follow up therapy are one component of a multi-disciplinary discharge planning process, led by the attending physician.  Recommendations may be updated based on patient status, additional functional criteria and insurance authorization. Assessment / Plan / Recommendation   01/02/2022  11:00 AM Clinical  Impressions Clinical Impression Pt presents with oropharyngeal dysphagia characterized by reduced labial seal, impaired mastication, a pharyngeal delay and reduction in bolus cohesion, posterior bolus propulsion, tongue base retraction, anterior laryngeal movement, and pharyngeal constriction. He demonstrated left-sided anterior spillage, lingual residue, difficulty with A-P transport of solids, premature spillage to the valleculae and pyriform sinuses, and posterior pharyngeal wall residue. A liquid bolus was necessary to facilitate mastication and A-P transport of a nutrigrain bar. Liquid washes reduced pharyngeal residue to a more functional level and a left head turn was effective in reducing pyriform sinus residue with thin liquids, but pt exhibited notable difficulty maintaining this position. Penetration (PAS 3, 5) and aspiration (PAS 7, 8) were noted with thin liquids. Larger amounts of aspirate did trigger a cough which was mostly effective, but smaller quantities inconsistently triggered throat clearing which was inadequate for expulsion of material. No functional benefit was noted with prompted coughing. A dysphagia 1 diet with nectar thick liquids is recommended at this time. SLP will follow for dysphagia treatment. SLP Visit Diagnosis Dysphagia, oropharyngeal phase (R13.12) Impact on safety and function Mild aspiration risk     01/02/2022  11:00 AM Treatment Recommendations Treatment Recommendations Therapy as outlined in treatment plan below     01/02/2022  11:00 AM Prognosis Prognosis for Safe Diet Advancement Good   01/02/2022  11:00 AM Diet Recommendations SLP Diet Recommendations Dysphagia 1 (Puree) solids;Nectar thick liquid Liquid Administration via Cup;Straw Medication Administration Whole meds with puree Compensations Slow rate;Small sips/bites;Monitor for anterior loss;Follow solids with liquid Postural Changes Seated upright at 90 degrees     01/02/2022  11:00 AM Other Recommendations Other  Recommendations Order thickener from pharmacy Follow Up Recommendations Acute inpatient rehab (3hours/day) Assistance recommended at discharge Frequent or constant Supervision/Assistance Functional Status Assessment Patient has had a recent decline in their functional status and demonstrates the ability to make significant improvements in function in a reasonable and predictable amount of time.   01/02/2022  11:00 AM Frequency and Duration  Speech Therapy Frequency (ACUTE ONLY) min 2x/week Treatment Duration 2 weeks     01/02/2022  11:00 AM Oral Phase Oral Phase Impaired Oral -  Nectar Cup Left anterior bolus loss;Reduced posterior propulsion;Lingual/palatal residue;Delayed oral transit;Decreased bolus cohesion;Premature spillage Oral - Nectar Straw Left anterior bolus loss;Reduced posterior propulsion;Lingual/palatal residue;Delayed oral transit;Decreased bolus cohesion;Premature spillage Oral - Thin Cup Left anterior bolus loss;Reduced posterior propulsion;Lingual/palatal residue;Delayed oral transit;Decreased bolus cohesion;Premature spillage Oral - Thin Straw Left anterior bolus loss;Reduced posterior propulsion;Lingual/palatal residue;Delayed oral transit;Decreased bolus cohesion;Premature spillage Oral - Puree Left anterior bolus loss;Reduced posterior propulsion;Lingual/palatal residue;Delayed oral transit;Decreased bolus cohesion;Weak lingual manipulation Oral - Mech Soft Left anterior bolus loss;Reduced posterior propulsion;Lingual/palatal residue;Delayed oral transit;Decreased bolus cohesion;Weak lingual manipulation;Impaired mastication Oral - Pill Left anterior bolus loss;Reduced posterior propulsion;Lingual/palatal residue;Delayed oral transit;Decreased bolus cohesion    01/02/2022  11:00 AM Pharyngeal Phase Pharyngeal Phase Impaired Pharyngeal- Thin Cup Reduced tongue base retraction;Reduced anterior laryngeal mobility;Delayed swallow initiation-pyriform sinuses;Reduced pharyngeal  peristalsis;Penetration/Aspiration during swallow;Penetration/Apiration after swallow;Pharyngeal residue - valleculae;Pharyngeal residue - pyriform;Pharyngeal residue - posterior pharnyx Pharyngeal Material enters airway, remains ABOVE vocal cords and not ejected out;Material enters airway, CONTACTS cords and not ejected out;Material enters airway, passes BELOW cords and not ejected out despite cough attempt by patient;Material enters airway, passes BELOW cords without attempt by patient to eject out (silent aspiration) Pharyngeal- Thin Straw Reduced tongue base retraction;Reduced anterior laryngeal mobility;Delayed swallow initiation-pyriform sinuses;Reduced pharyngeal peristalsis;Penetration/Aspiration during swallow;Penetration/Apiration after swallow;Pharyngeal residue - valleculae;Pharyngeal residue - pyriform;Pharyngeal residue - posterior pharnyx Pharyngeal Material enters airway, passes BELOW cords and not ejected out despite cough attempt by patient;Material enters airway, passes BELOW cords without attempt by patient to eject out (silent aspiration);Material enters airway, CONTACTS cords and then ejected out Pharyngeal- Puree Reduced tongue base retraction;Reduced anterior laryngeal mobility;Delayed swallow initiation-pyriform sinuses;Reduced pharyngeal peristalsis;Pharyngeal residue - valleculae;Pharyngeal residue - pyriform;Pharyngeal residue - posterior pharnyx Pharyngeal- Mechanical Soft Reduced tongue base retraction;Reduced anterior laryngeal mobility;Delayed swallow initiation-pyriform sinuses;Reduced pharyngeal peristalsis;Pharyngeal residue - valleculae;Pharyngeal residue - pyriform;Pharyngeal residue - posterior pharnyx Pharyngeal- Pill Reduced tongue base retraction;Reduced anterior laryngeal mobility;Delayed swallow initiation-pyriform sinuses;Reduced pharyngeal peristalsis;Pharyngeal residue - valleculae;Pharyngeal residue - pyriform;Pharyngeal residue - posterior pharnyx    01/02/2022  11:00 AM  Cervical Esophageal Phase  Cervical Esophageal Phase [facility] Shanika I. Hardin Negus, [place], [place] Office number [phone] Pager [place] 01/02/2022, 12:20 PM                      Microbiology: No results found for this or any previous visit (from the past 240 hour(s)).  Time spent: 30 minutes  Signed: Berle Mull, MD 2022/02/15

## 2022-02-01 DEATH — deceased

## 2023-06-23 IMAGING — DX DG CHEST 1V PORT
1 series · 1 of 1 positions shown · non-contrast
Comparison: Chest two views 03/23/2013

CLINICAL DATA: CVA. Fell last night. Family concerned patient
injured left side

EXAM:
PORTABLE CHEST 1 VIEW

[chest ap]
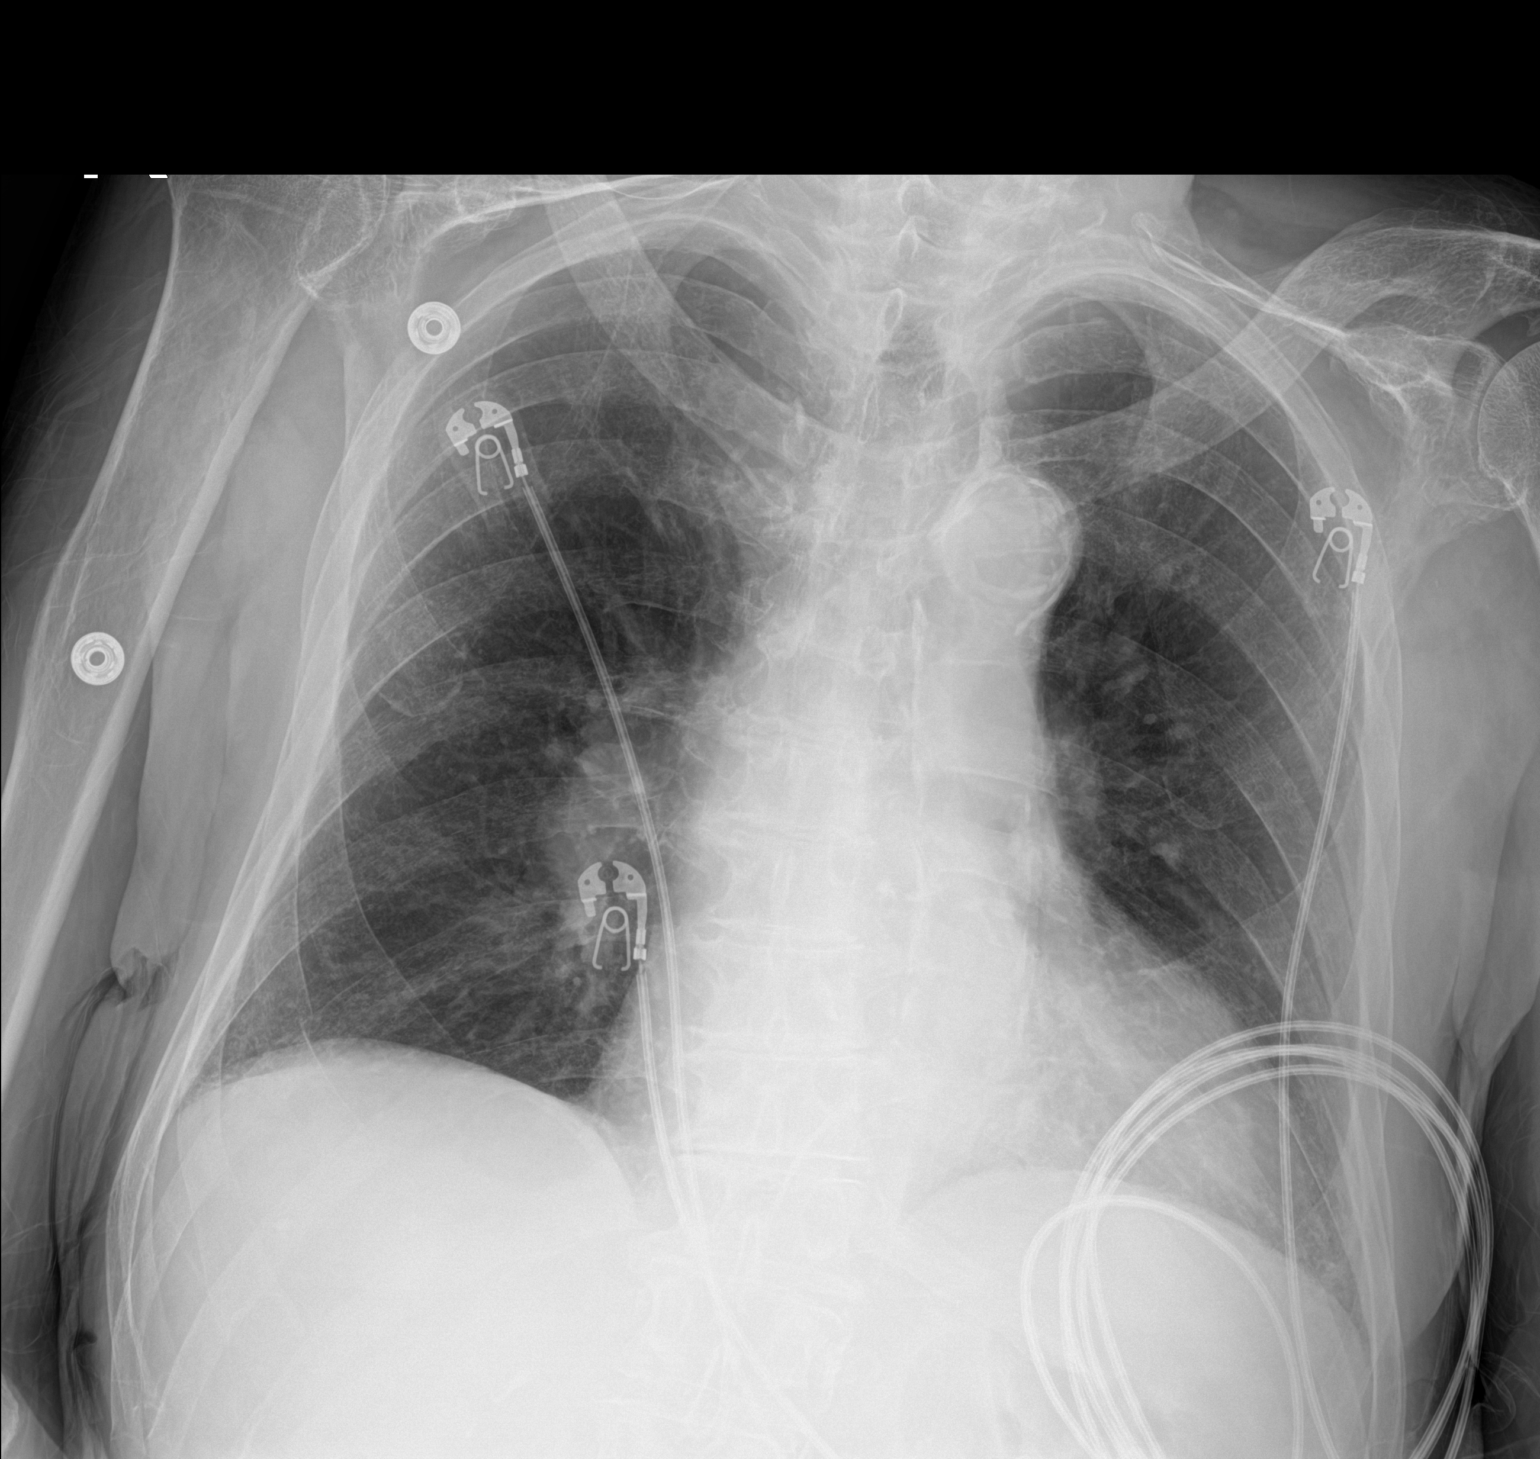

[1 of 1 positions shown; findings below may reference images not displayed]

FINDINGS: Cardiac silhouette is mildly enlarged. Moderate calcifications
within the aortic arch and descending thoracic aorta. Minimal
bibasilar horizontal linear chronic scarring is unchanged. No focal
airspace opacity to indicate pneumonia. No pleural effusion or
pneumothorax. Moderate multilevel degenerative disc changes of the
thoracic spine.
IMPRESSION: Mild stable cardiomegaly.

No acute lung process.

## 2023-06-23 IMAGING — CT CT CERVICAL SPINE W/O CM
3 of 4 series · 11 of 35 positions shown, 13 images · non-contrast
Comparison: None Available.

CLINICAL DATA: There logic deaf sick, neck trauma



[Series 3: c spine bone · axial · 0.25mm/px · z∈[+1484,+1586]mm · 3 of 104 slices shown, 4 images]
[im 30/104  soft-tissue]
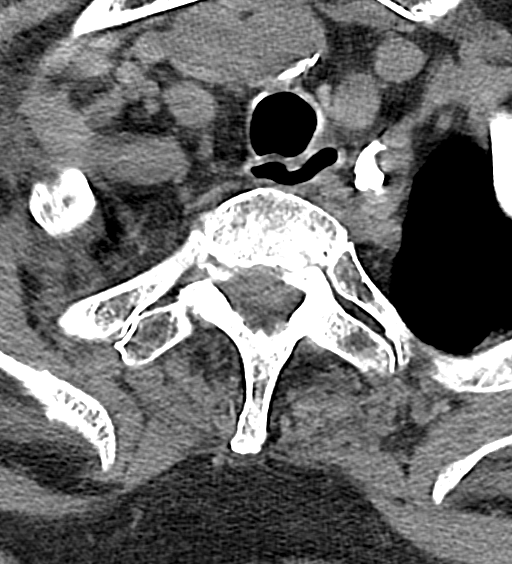
[im 30/104  bone]
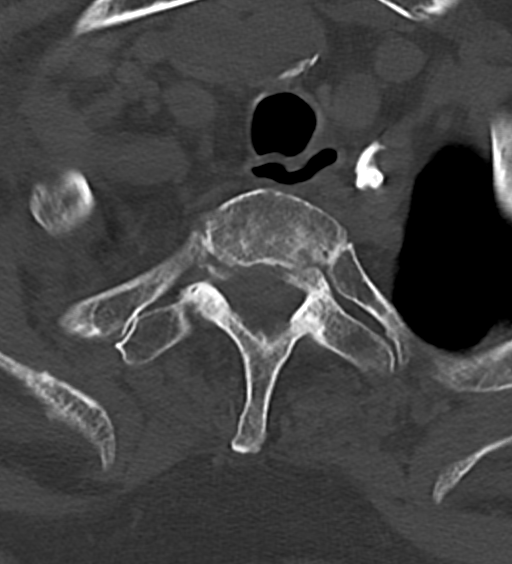
[im 59/104  bone]
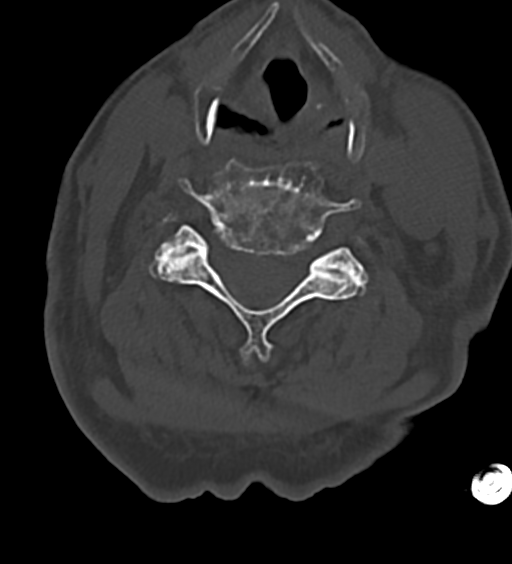
[im 89/104  bone]
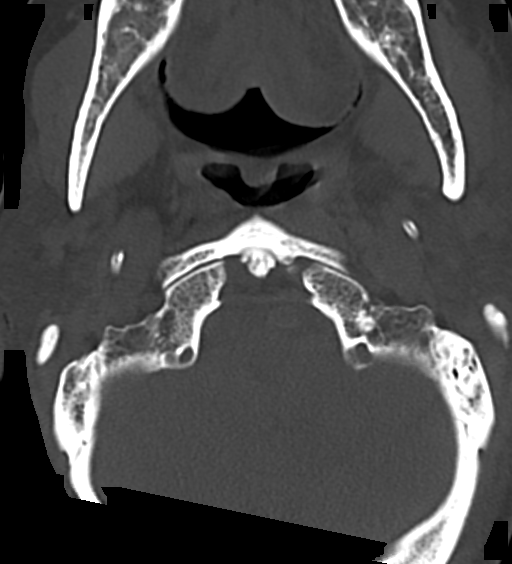

[Series 7: sag bone · sagittal · 0.28mm/px · 5 of 56 slices shown, 6 images]
[im 19/56  bone]
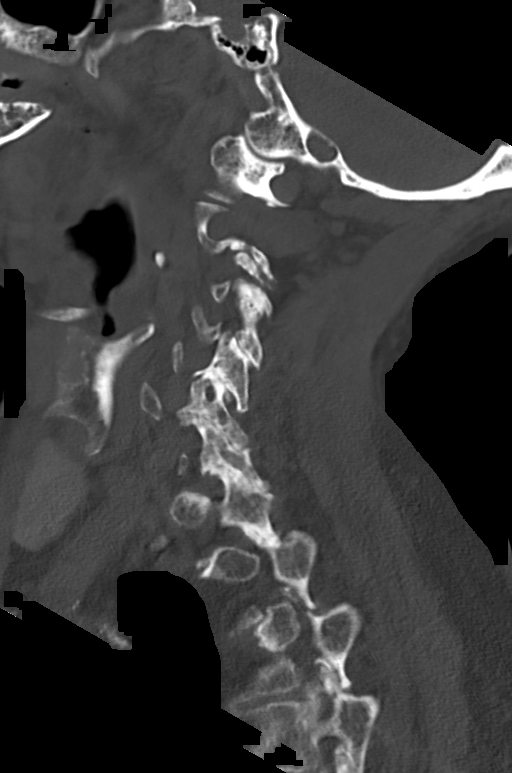
[im 23/56  bone]
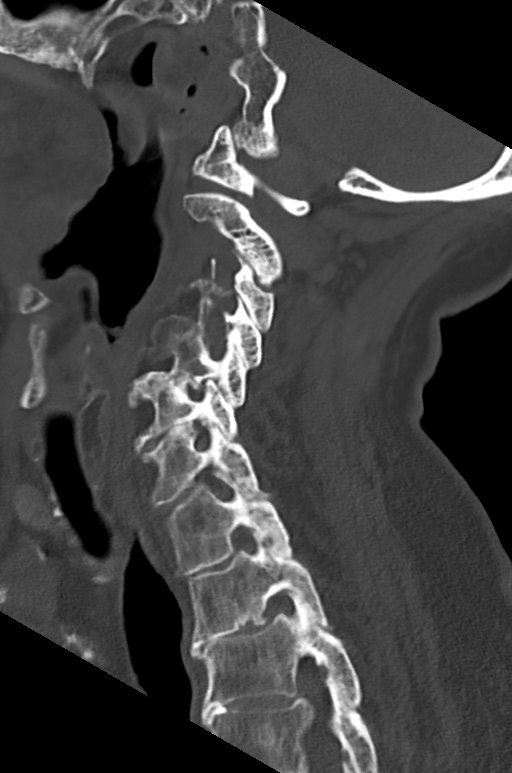
[im 28/56  soft-tissue]
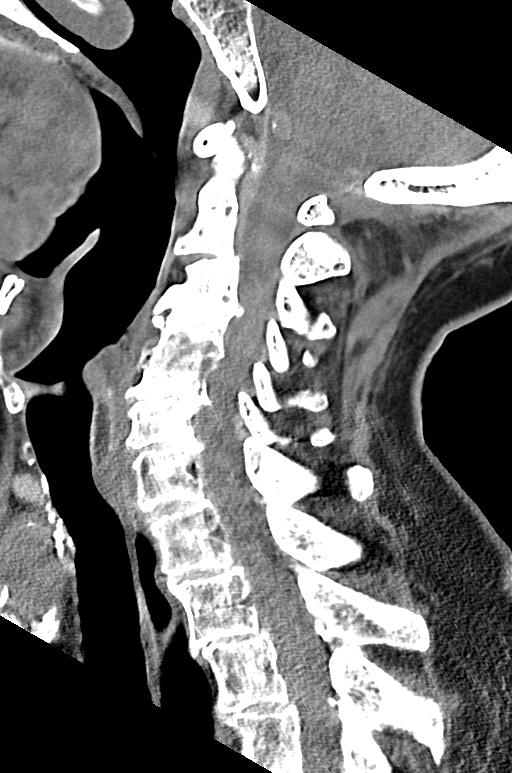
[im 28/56  bone]
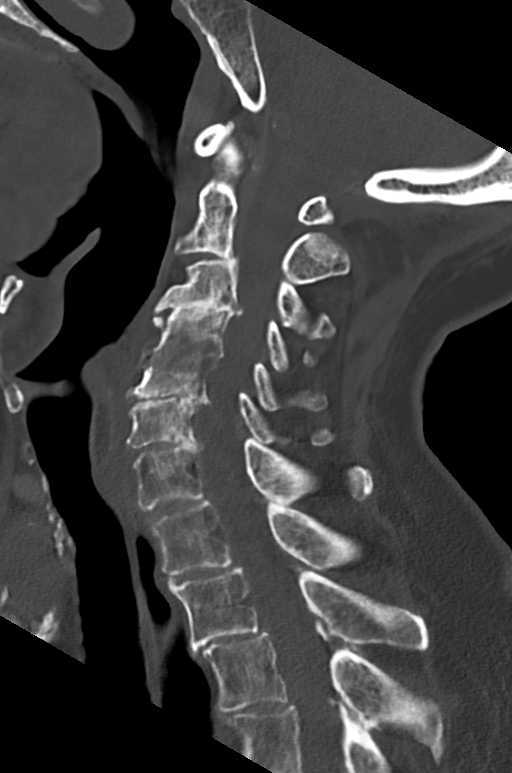
[im 33/56  bone]
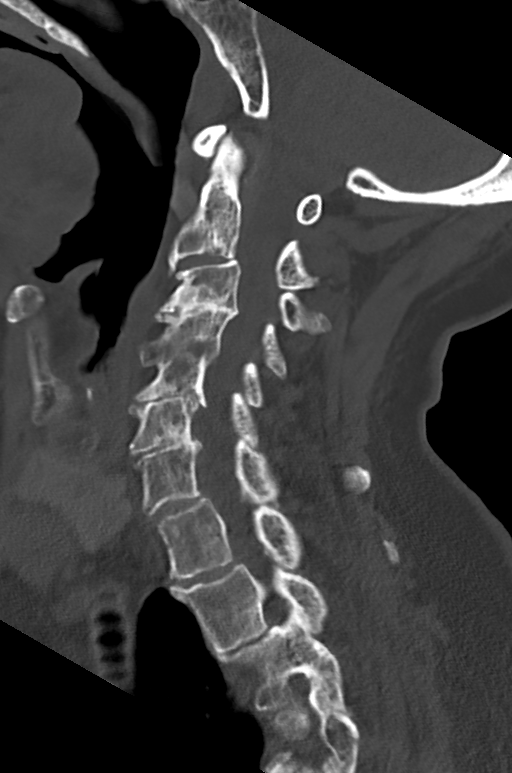
[im 37/56  bone]
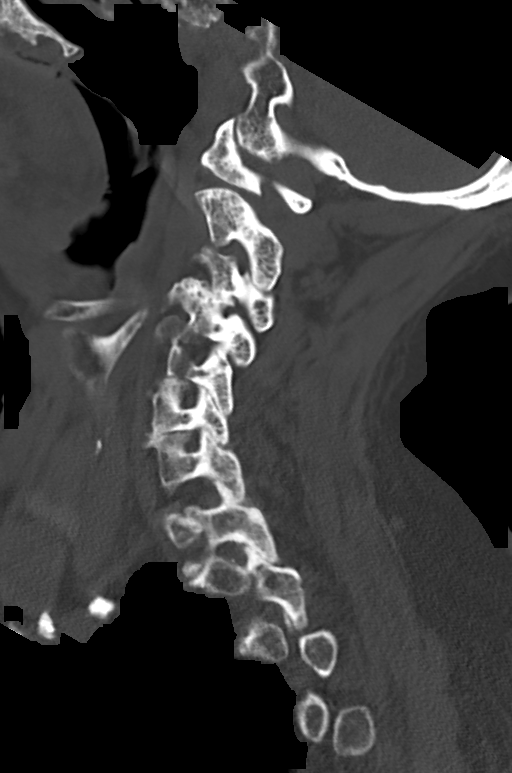

[Series 8: cor bone · coronal · 0.22mm/px · 3 of 71 slices shown]
[im 21/71  bone]
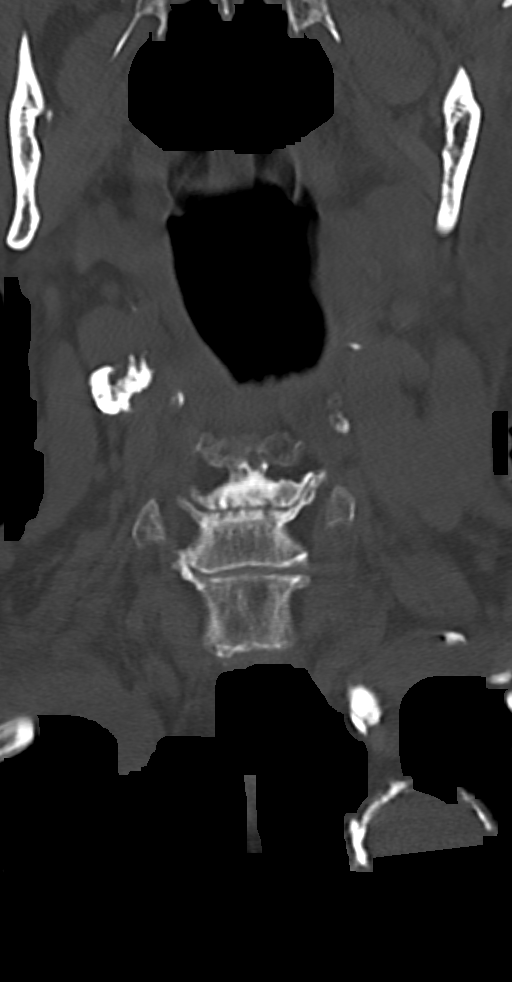
[im 31/71  bone]
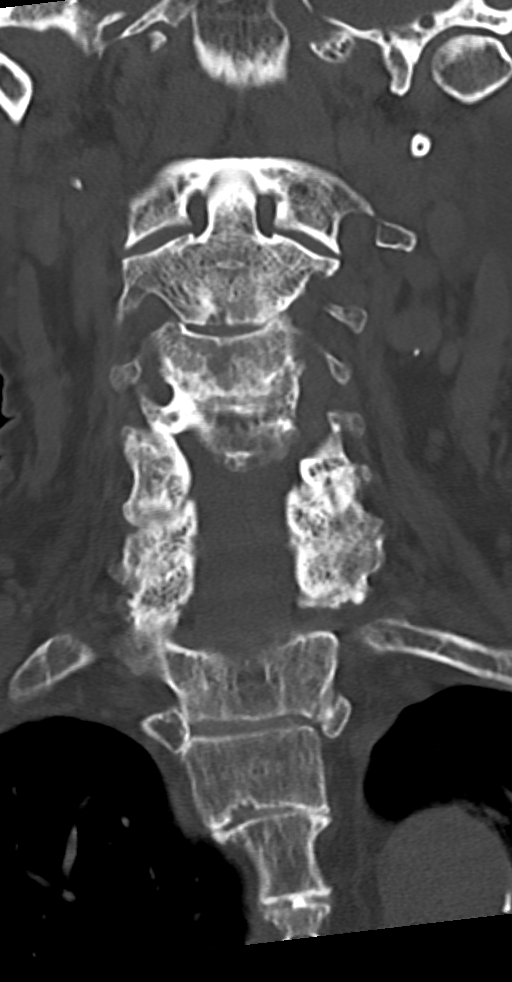
[im 41/71  bone]
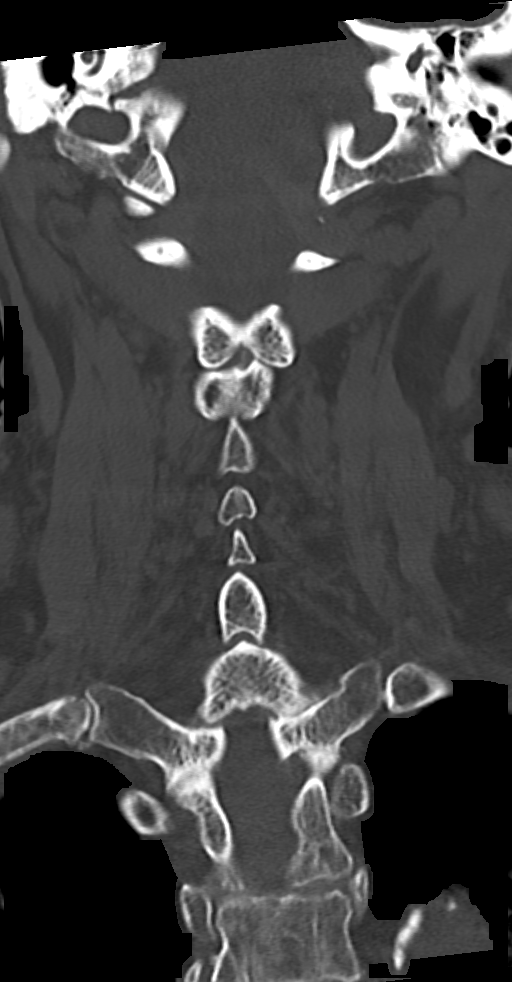

[11 of 35 positions shown; findings below may reference images not displayed]

FINDINGS: Alignment: Mild degenerative anterolisthesis of C2 and C3. Otherwise
alignment is anatomic.

Skull base and vertebrae: No acute fracture. No primary bone lesion
or focal pathologic process.

Soft tissues and spinal canal: No prevertebral fluid or swelling. No
visible canal hematoma.

Disc levels: Partial bony fusion across the disc spaces at C3-4 and
C4-5. There is severe multilevel spondylosis throughout the
remainder of the cervical spine, greatest at C5-6 and C6-7. Diffuse
facet hypertrophy.

Upper chest: Airway is patent. Lung apices are clear. Prominent
atherosclerosis of the aortic arch.

Other: Reconstructed images demonstrate no additional findings.
IMPRESSION: 1. Extensive multilevel cervical degenerative changes. No acute
fracture.

## 2023-06-23 IMAGING — DX DG ELBOW COMPLETE 3+V*L*
4 series · 5 of 5 positions shown · non-contrast
Comparison: None Available.

CLINICAL DATA: Fall.  Left elbow pain and bruising.

EXAM:
LEFT ELBOW - COMPLETE 3+ VIEW

[Series 2: elbow obl · 0.14mm/px · 2 of 2 slices shown (1 of 2)]
[im 1/2]
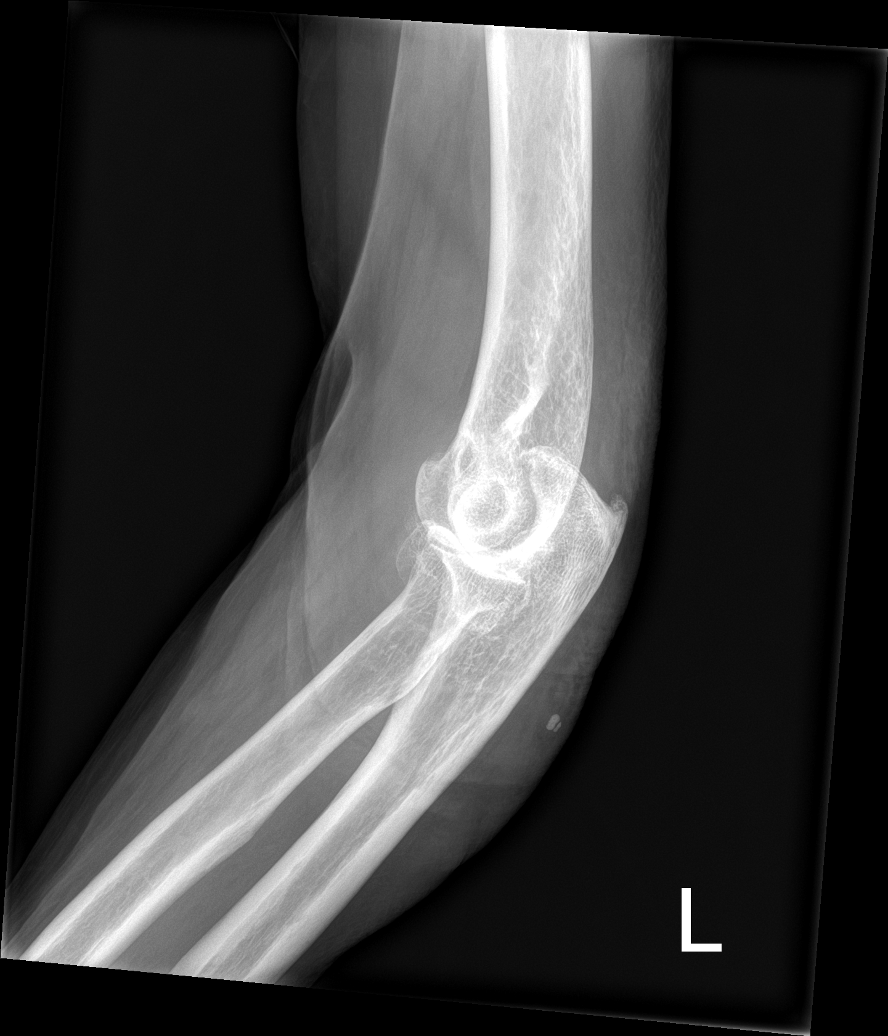
[im 2/2]
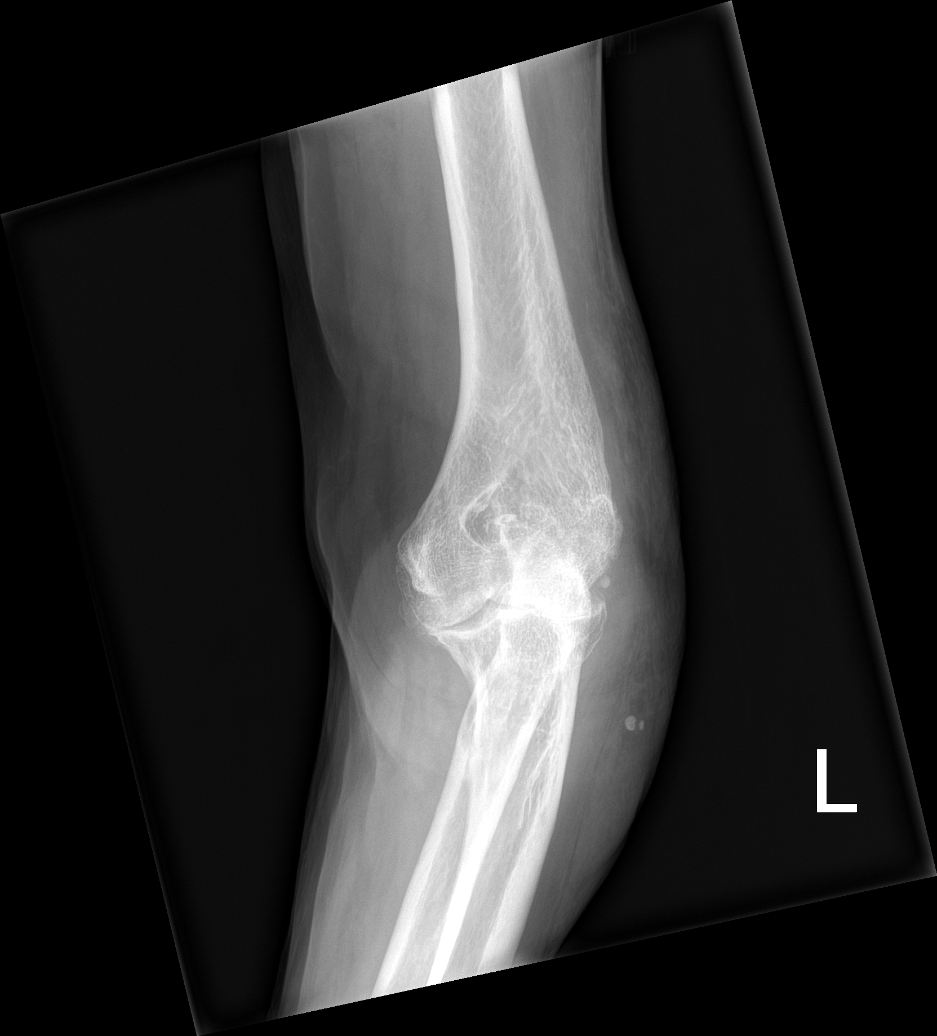

[elbow obl (2 of 2)]
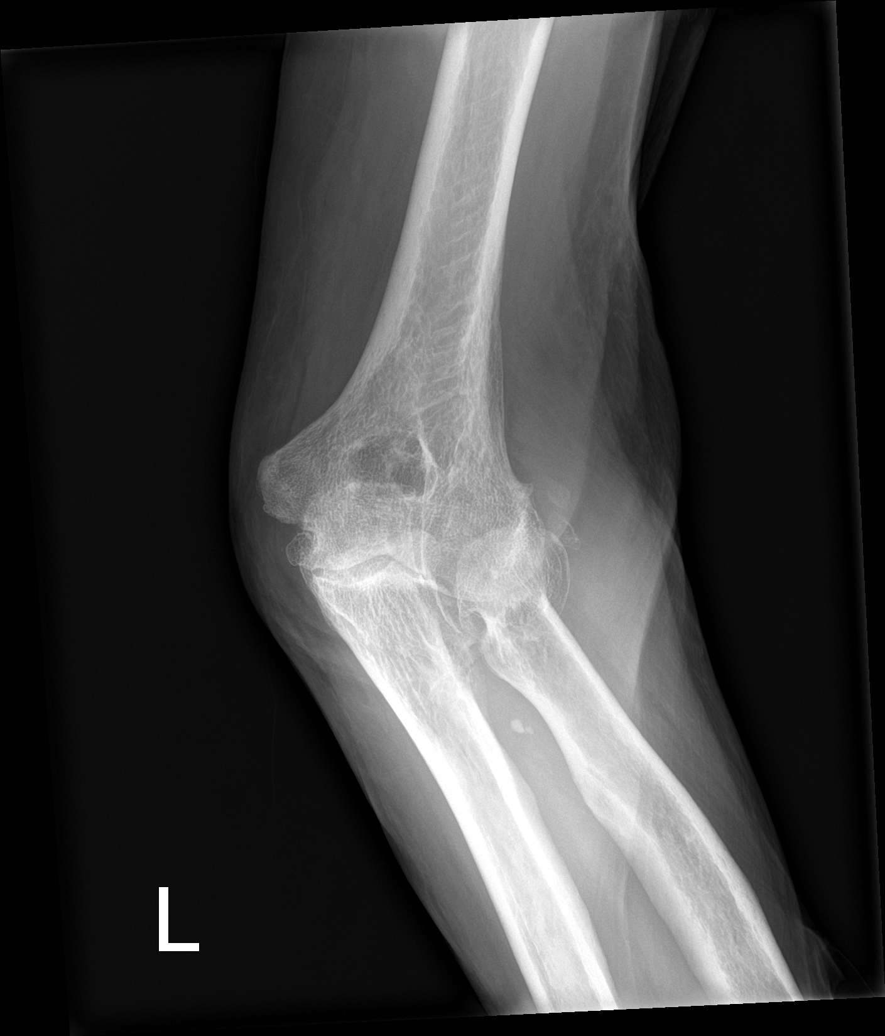

[elbow lat]
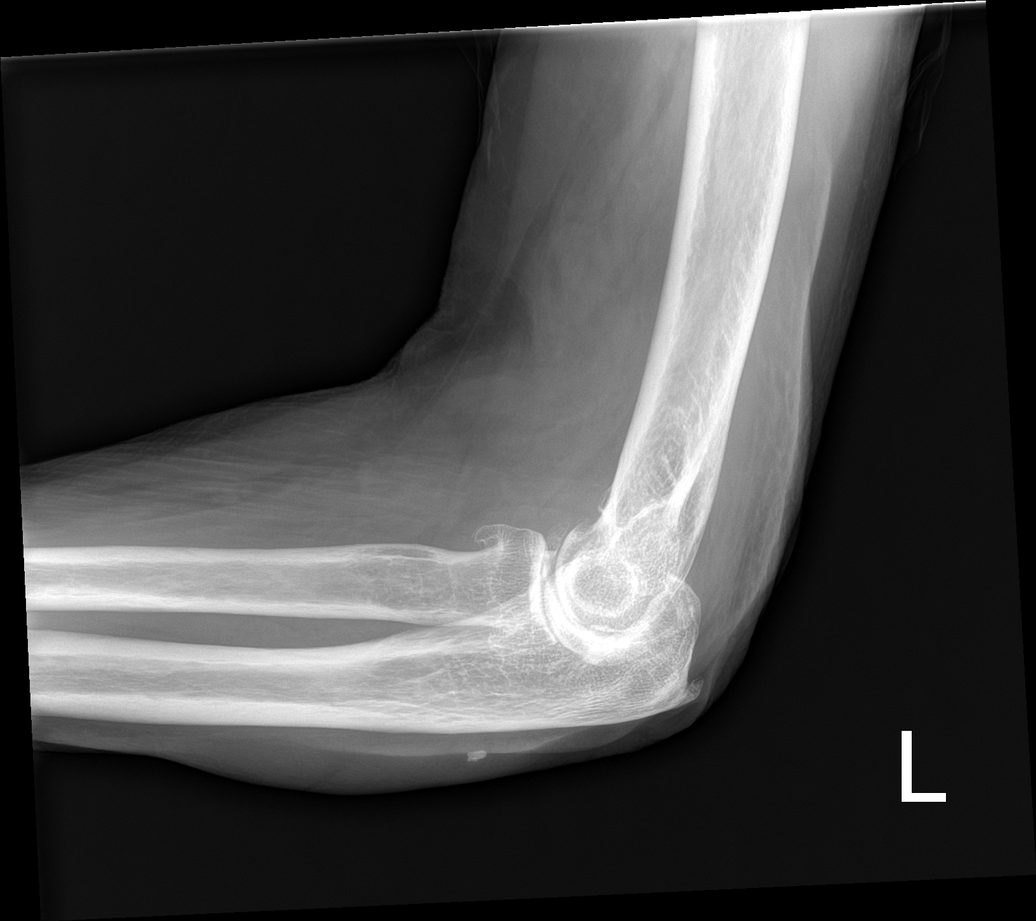

[elbow ap]
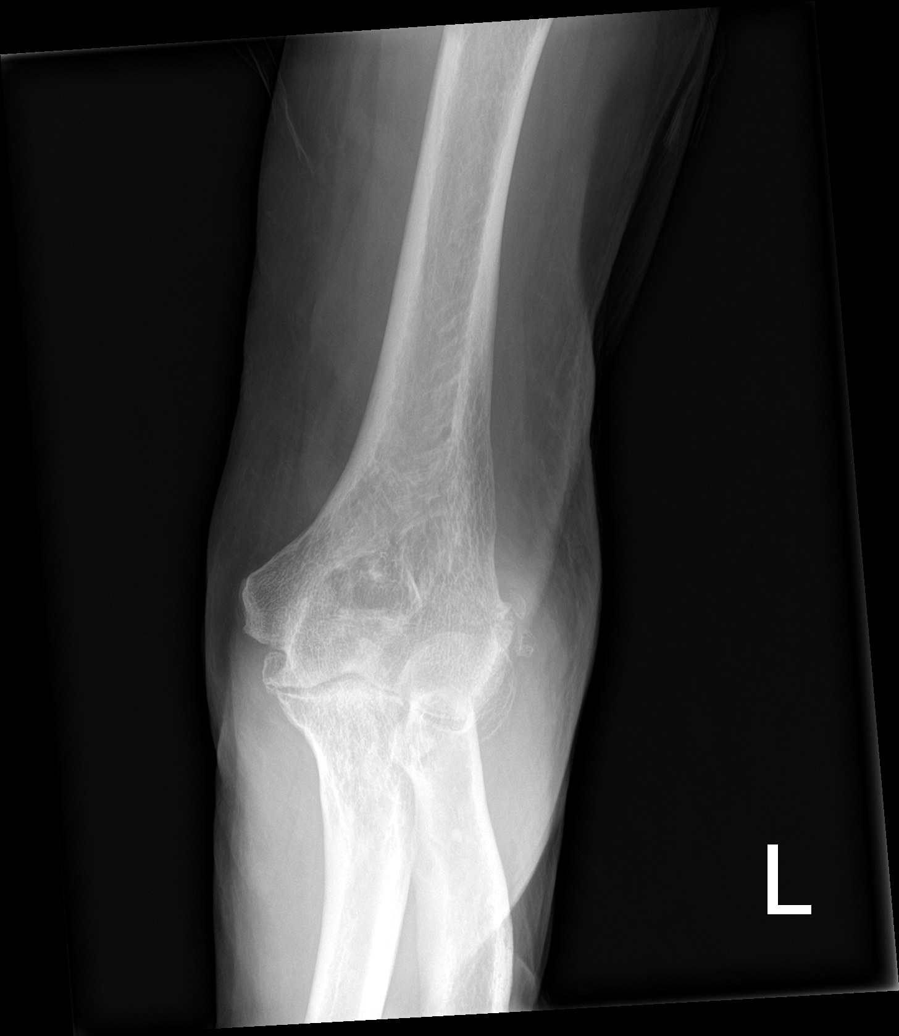

[5 of 5 positions shown; findings below may reference images not displayed]

FINDINGS: No fracture or dislocation.

Narrowed radiocapitellar joint. Marginal osteophytes project from
the base of the radial head.

No joint effusion.

There is soft tissue edema posteriorly.
IMPRESSION: No fracture or dislocation.

## 2023-06-23 IMAGING — CT CT HEAD W/O CM
3 of 4 series · 13 of 47 positions shown, 15 images · non-contrast
Comparison: None Available.

CLINICAL DATA: Neurologic deficit



[Series 3: head wo · axial · 0.32mm/px · z∈[+1636,+1755]mm · 7 of 34 slices shown, 9 images]
[im 5/34  brain]
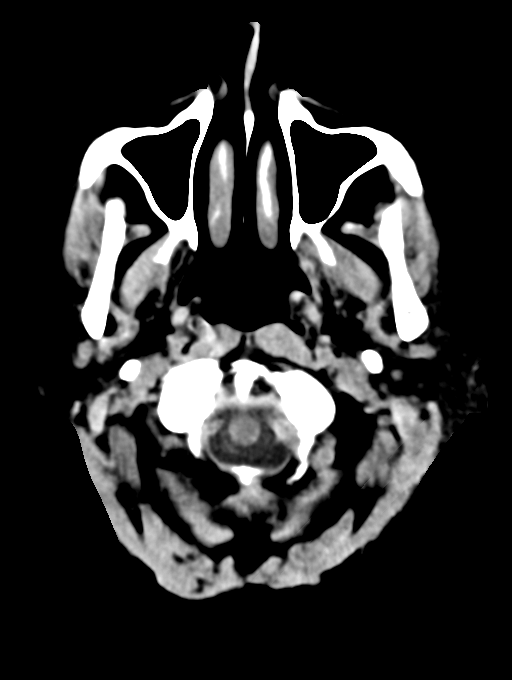
[im 5/34  bone]
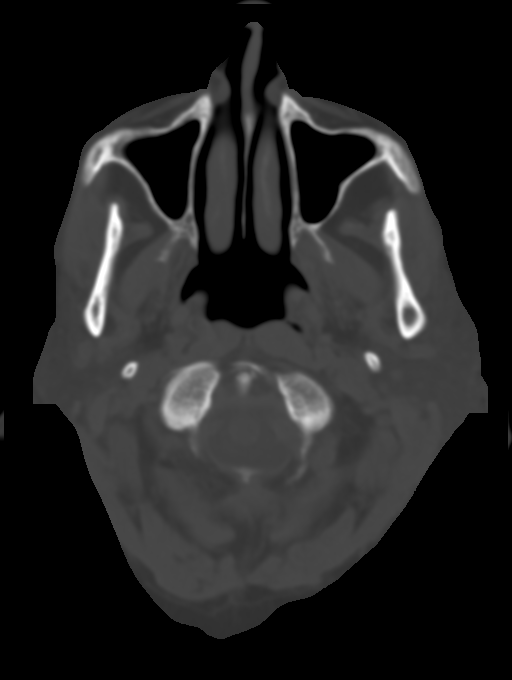
[im 9/34  brain]
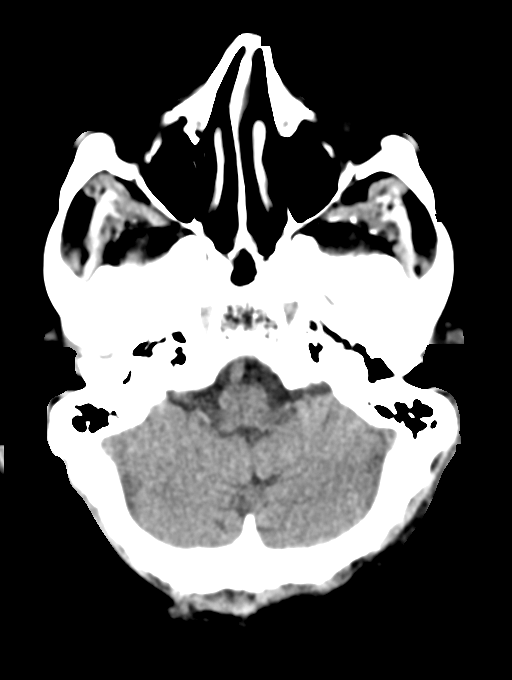
[im 13/34  brain]
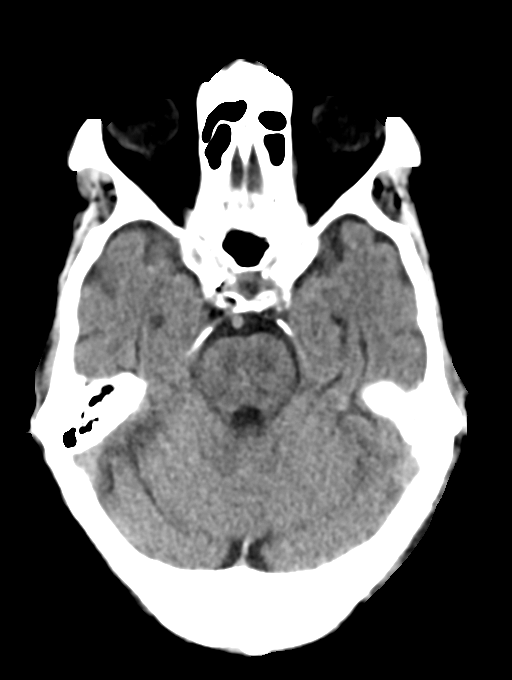
[im 17/34  brain]
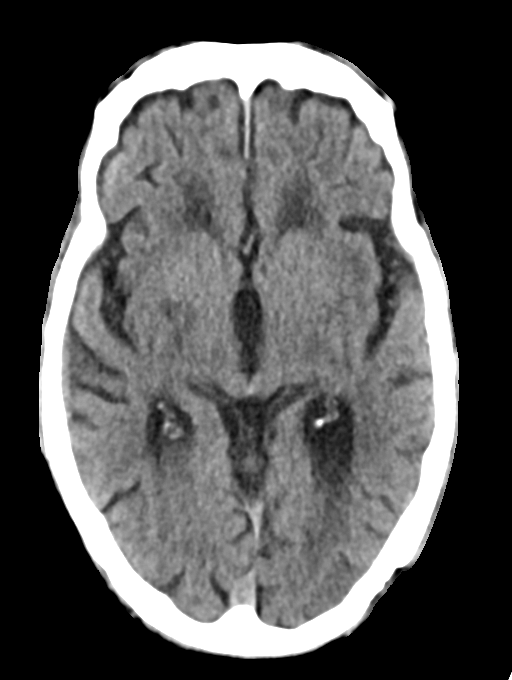
[im 21/34  brain]
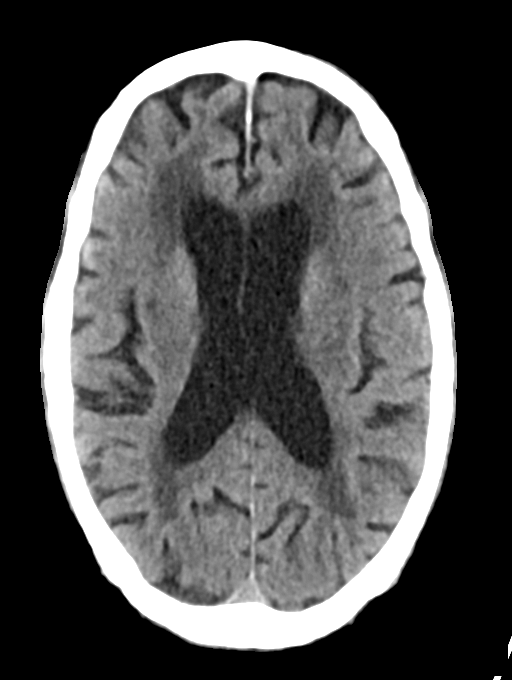
[im 21/34  bone]
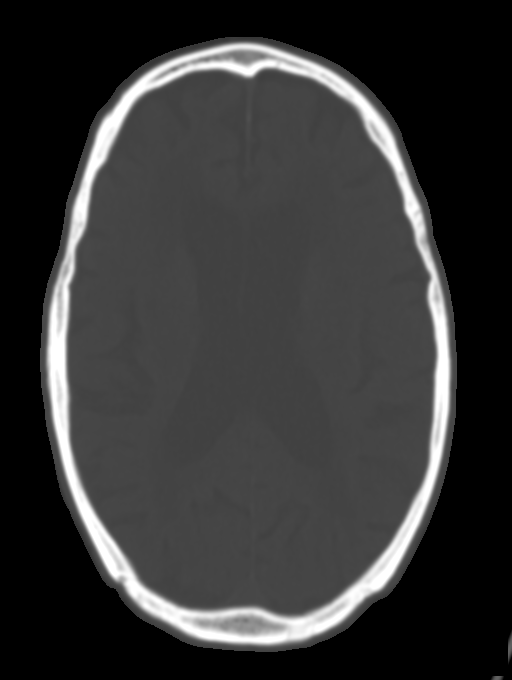
[im 25/34  brain]
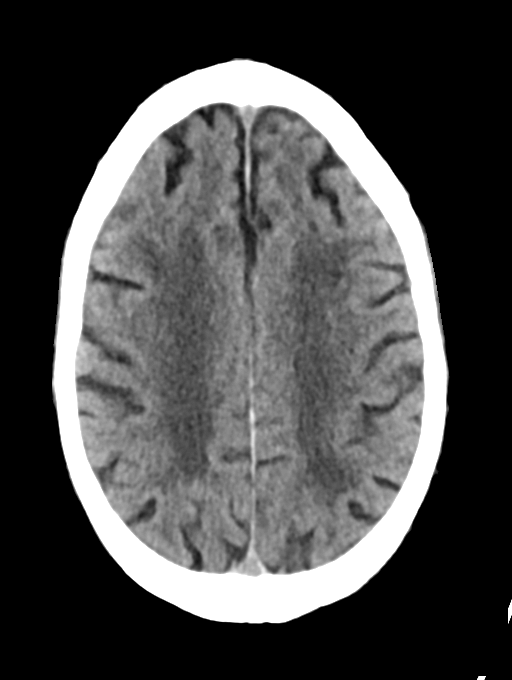
[im 29/34  brain]
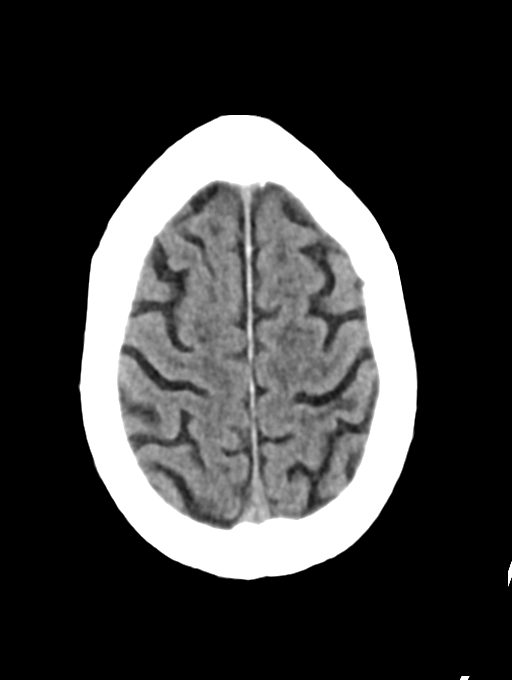

[Series 5: cor soft · coronal · 0.32mm/px · 3 of 72 slices shown]
[im 24/72  brain]
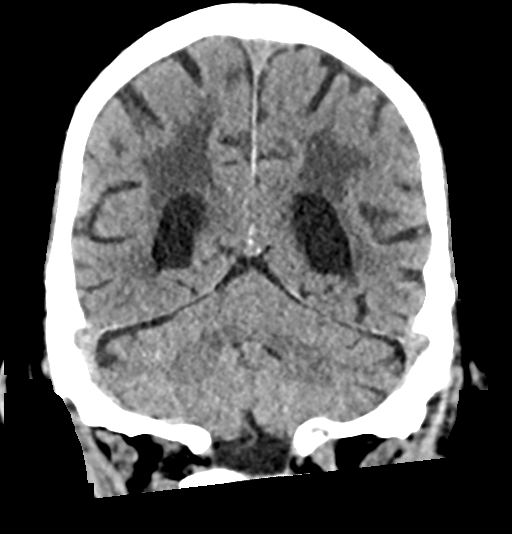
[im 32/72  brain]
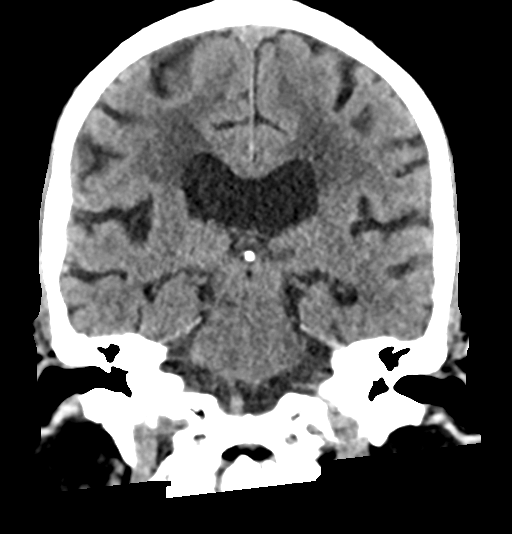
[im 40/72  brain]
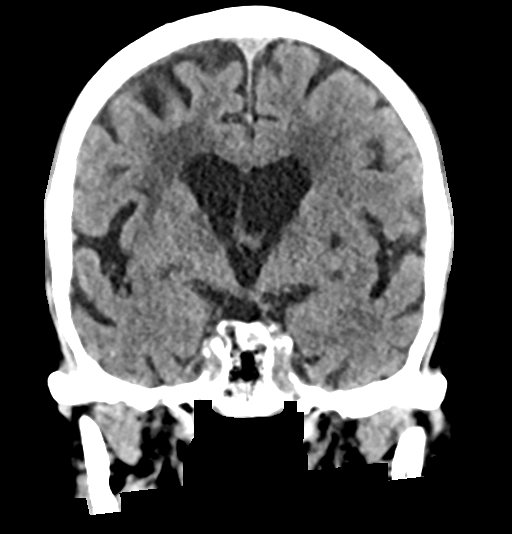

[Series 6: sag soft · sagittal · 0.33mm/px · 3 of 52 slices shown]
[im 18/52  brain]
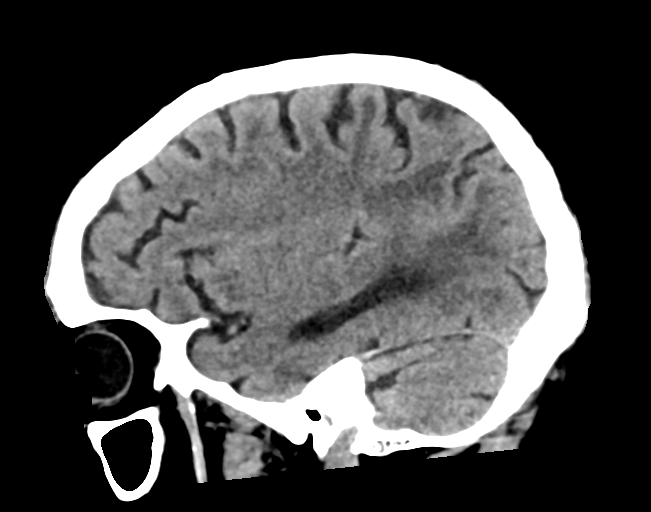
[im 26/52  brain]
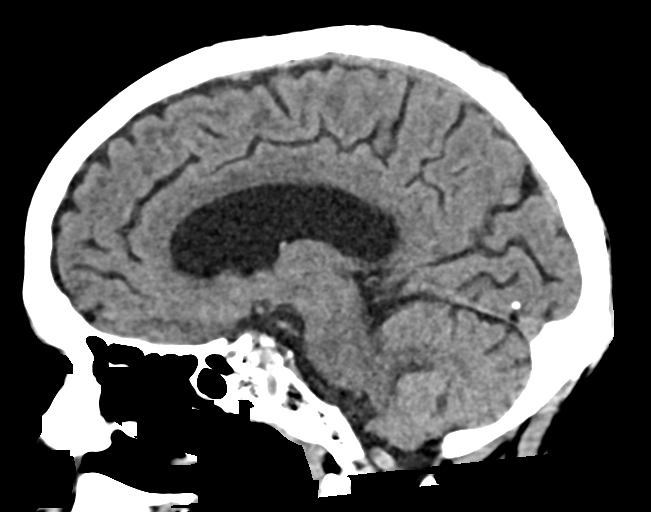
[im 35/52  brain]
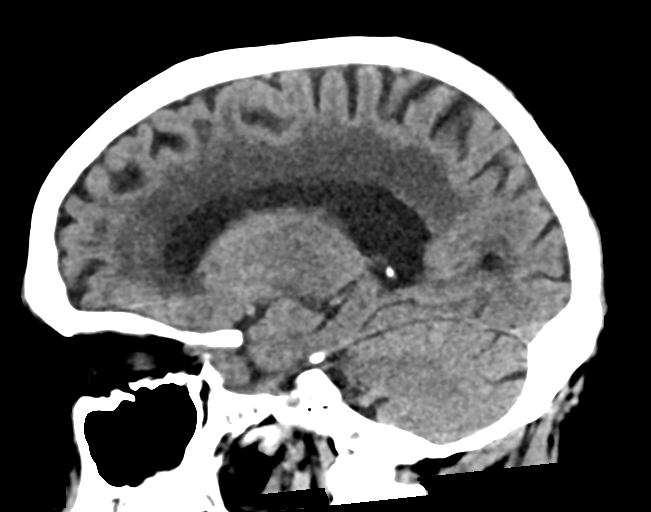

[13 of 47 positions shown; findings below may reference images not displayed]

FINDINGS: Brain: Confluent hypodensities throughout the periventricular white
matter are most consistent with age-indeterminate small vessel
ischemic changes, likely chronic. Focal hypodensities in the left
basal ganglia and right frontal periventricular white matter
consistent with chronic lacunar infarcts.

Age-indeterminate lacunar infarct is seen within the right basal
ganglia image [DATE]. No other signs of acute infarct or hemorrhage.
Lateral ventricles and midline structures are otherwise
unremarkable. No acute extra-axial fluid collections. No mass
effect.

Vascular: No hyperdense vessel or unexpected calcification.

Skull: Normal. Negative for fracture or focal lesion.

Sinuses/Orbits: No acute finding.

Other: None.
IMPRESSION: 1. Age indeterminate lacunar infarct within the right basal ganglia.
2. Age-indeterminate small-vessel ischemic changes throughout the
periventricular white matter, favor chronic.
3. Chronic left basal ganglia and right frontal white matter lacunar
infarcts as above.
4. No acute hemorrhage.
